# Patient Record
Sex: Female | Born: 1955 | Race: Black or African American | Hispanic: No | Smoking: Former smoker
Health system: Southern US, Community
[De-identification: ages and names within clinical notes are randomized; demographics above are authoritative.]

## PROBLEM LIST (undated history)

## (undated) DIAGNOSIS — F03918 Unspecified dementia, unspecified severity, with other behavioral disturbance: Secondary | ICD-10-CM

## (undated) DIAGNOSIS — F1010 Alcohol abuse, uncomplicated: Secondary | ICD-10-CM

## (undated) DIAGNOSIS — R1310 Dysphagia, unspecified: Secondary | ICD-10-CM

## (undated) DIAGNOSIS — R569 Unspecified convulsions: Secondary | ICD-10-CM

## (undated) DIAGNOSIS — F0391 Unspecified dementia with behavioral disturbance: Secondary | ICD-10-CM

## (undated) DIAGNOSIS — M1990 Unspecified osteoarthritis, unspecified site: Secondary | ICD-10-CM

## (undated) DIAGNOSIS — I82412 Acute embolism and thrombosis of left femoral vein: Secondary | ICD-10-CM

## (undated) DIAGNOSIS — F10959 Alcohol use, unspecified with alcohol-induced psychotic disorder, unspecified: Secondary | ICD-10-CM

## (undated) DIAGNOSIS — E119 Type 2 diabetes mellitus without complications: Secondary | ICD-10-CM

## (undated) DIAGNOSIS — K5909 Other constipation: Principal | ICD-10-CM

## (undated) HISTORY — DX: Alcohol use, unspecified with alcohol-induced psychotic disorder, unspecified: F10.959

## (undated) HISTORY — DX: Unspecified dementia with behavioral disturbance: F03.91

## (undated) HISTORY — DX: Other constipation: K59.09

## (undated) HISTORY — DX: Alcohol abuse, uncomplicated: F10.10

## (undated) HISTORY — DX: Unspecified osteoarthritis, unspecified site: M19.90

## (undated) HISTORY — DX: Unspecified dementia, unspecified severity, with other behavioral disturbance: F03.918

## (undated) HISTORY — DX: Dysphagia, unspecified: R13.10

## (undated) HISTORY — DX: Acute embolism and thrombosis of left femoral vein: I82.412

## (undated) HISTORY — DX: Type 2 diabetes mellitus without complications: E11.9

---

## 2011-12-24 ENCOUNTER — Encounter (HOSPITAL_COMMUNITY): Payer: Self-pay | Admitting: Emergency Medicine

## 2011-12-24 ENCOUNTER — Emergency Department (HOSPITAL_COMMUNITY)
Admission: EM | Admit: 2011-12-24 | Discharge: 2011-12-24 | Disposition: A | Discharge status: Home / Self Care | Payer: Medicaid Other | Attending: Emergency Medicine | Admitting: Emergency Medicine

## 2011-12-24 ENCOUNTER — Emergency Department (HOSPITAL_COMMUNITY): Payer: Medicaid Other

## 2011-12-24 DIAGNOSIS — M79609 Pain in unspecified limb: Secondary | ICD-10-CM | POA: Insufficient documentation

## 2011-12-24 DIAGNOSIS — F17200 Nicotine dependence, unspecified, uncomplicated: Secondary | ICD-10-CM | POA: Insufficient documentation

## 2011-12-24 DIAGNOSIS — M79659 Pain in unspecified thigh: Secondary | ICD-10-CM

## 2011-12-24 DIAGNOSIS — M25559 Pain in unspecified hip: Secondary | ICD-10-CM | POA: Insufficient documentation

## 2011-12-24 HISTORY — DX: Unspecified convulsions: R56.9

## 2011-12-24 MED ORDER — NAPROXEN 500 MG PO TABS: 500.0000 mg | ORAL_TABLET | Freq: Two times a day (BID) | ORAL | Status: AC

## 2011-12-24 MED ORDER — HYDROCODONE-ACETAMINOPHEN 5-325 MG PO TABS: 1.0000 | ORAL_TABLET | Freq: Four times a day (QID) | ORAL | Status: AC | PRN

## 2011-12-24 NOTE — ED Provider Notes (Signed)
Medical screening examination/treatment/procedure(s) were performed by non-physician practitioner and as supervising physician I was immediately available for consultation/collaboration.  Olivia Mackie, MD 12/24/11 2030

## 2011-12-24 NOTE — ED Provider Notes (Signed)
History     CSN: [id number]  Arrival date & time 12/24/11  0427   First MD Initiated Contact with Patient 12/24/11 0437      5:45 AM Reports she woke up with right-sided leg pain on a lateral portion of hip radiating laterally down the thigh to knee. Denies back pain. Reports pain with her from sleep. Patient denies numbness, tingling, weakness, back pain, abdominal pain, nausea, vomiting, urinary symptoms, injury. Patient appears very frail. Denies history of cancer chronic hip pain. Patient is a 56 y.o. female presenting with leg pain. The history is provided by the patient.  Leg Pain  The incident occurred 1 to 2 hours ago. The injury mechanism is unknown. The pain is present in the right hip. The pain is moderate. The pain has been constant since onset. Pertinent negatives include no numbness, no inability to bear weight, no loss of motion, no muscle weakness, no loss of sensation and no tingling. She reports no foreign bodies present. The symptoms are aggravated by bearing weight, activity and palpation. She has tried nothing for the symptoms.    Past Medical History  Diagnosis Date  . Seizures     History reviewed. No pertinent past surgical history.  History reviewed. No pertinent family history.  History  Substance Use Topics  . Smoking status: Current Everyday Smoker  . Smokeless tobacco: Not on file  . Alcohol Use: No     occ    OB History    Grav Para Term Preterm Abortions TAB SAB Ect Mult Living                  Review of Systems  Constitutional: Negative for fever and chills.  HENT: Negative for neck pain and neck stiffness.   Cardiovascular: Negative for chest pain.  Gastrointestinal: Negative for abdominal pain.  Genitourinary: Negative for dysuria, urgency, frequency, hematuria, flank pain and pelvic pain.  Musculoskeletal: Negative for back pain.       Hip pain. Denies saddle anesthesias, or perineal numbness, urinary or bowel incontinence    Neurological: Negative for tingling, weakness, numbness and headaches.  All other systems reviewed and are negative.    Allergies  Review of patient's allergies indicates no known allergies.  Home Medications   Current Outpatient Rx  Name Route Sig Dispense Refill  . ACETAMINOPHEN 325 MG PO TABS Oral Take 650 mg by mouth every 6 (six) hours as needed. For pain    . ASPIRIN 325 MG PO TABS Oral Take 325 mg by mouth every 6 (six) hours as needed. For pain      BP 101/62  Pulse 81  Temp 97.2 F (36.2 C) (Oral)  Resp 16  SpO2 98%  Physical Exam  Vitals reviewed. Constitutional: She is oriented to person, place, and time. Vital signs are normal. She appears well-developed and well-nourished. No distress.  HENT:  Head: Normocephalic and atraumatic.  Eyes: Pupils are equal, round, and reactive to light.  Neck: Neck supple.  Pulmonary/Chest: Effort normal.  Musculoskeletal:       Right hip: She exhibits tenderness. She exhibits normal range of motion, normal strength, no bony tenderness, no swelling, no crepitus, no deformity and no laceration.       Legs:      Patient reports pain that begins on the lateral right hip and radiates down she lateral knee. Patient reports pain with palpation of entire knee. However on putting on pants she stands on one leg using her right lower extremity without  difficulty. Normal sensation, distal pulses.  Neurological: She is alert and oriented to person, place, and time.  Skin: Skin is warm and dry. No rash noted. No erythema. No pallor.  Psychiatric: She has a normal mood and affect. Her behavior is normal.    ED Course  Procedures  No results found for this or any previous visit. Dg Pelvis 1-2 Views  12/24/2011  *RADIOLOGY REPORT*  Clinical Data: Right pelvic/hip pain.  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 There is mild flattening and increased density of the femoral heads. There is  no evidence of acute fracture, subluxation or dislocation. No focal bony lesions are identified.  IMPRESSION: Severe degenerative changes within both hips.  No evidence of acute abnormality.  Original Report Authenticated By: Rosendo Gros, M.D.      MDM    Patient d/ced with Analgesics and ortho referral      Thomasene Lot, PA-C 12/24/11 2007

## 2011-12-24 NOTE — ED Notes (Signed)
Patient with leg pain, right leg.  Patient states it started this morning.

## 2011-12-24 NOTE — ED Notes (Signed)
Pt discharged home. Encouraged to follow up with PCP. Wheelchair to triage.

## 2013-12-16 ENCOUNTER — Encounter: Payer: Self-pay | Admitting: Internal Medicine

## 2013-12-16 ENCOUNTER — Non-Acute Institutional Stay (SKILLED_NURSING_FACILITY): Payer: Medicaid Other | Admitting: Internal Medicine

## 2013-12-16 DIAGNOSIS — F10988 Alcohol use, unspecified with other alcohol-induced disorder: Secondary | ICD-10-CM

## 2013-12-16 DIAGNOSIS — R1310 Dysphagia, unspecified: Secondary | ICD-10-CM | POA: Insufficient documentation

## 2013-12-16 DIAGNOSIS — F10959 Alcohol use, unspecified with alcohol-induced psychotic disorder, unspecified: Secondary | ICD-10-CM | POA: Insufficient documentation

## 2013-12-16 DIAGNOSIS — F0391 Unspecified dementia with behavioral disturbance: Secondary | ICD-10-CM

## 2013-12-16 DIAGNOSIS — F03918 Unspecified dementia, unspecified severity, with other behavioral disturbance: Secondary | ICD-10-CM | POA: Insufficient documentation

## 2013-12-16 DIAGNOSIS — F1010 Alcohol abuse, uncomplicated: Secondary | ICD-10-CM | POA: Insufficient documentation

## 2013-12-16 DIAGNOSIS — E119 Type 2 diabetes mellitus without complications: Secondary | ICD-10-CM

## 2013-12-16 NOTE — Assessment & Plan Note (Signed)
No longer using 

## 2013-12-16 NOTE — Progress Notes (Signed)
MRN: [id number] Name: Crisoforo OxfordFrieda Tadesse  Sex: female Age: 58 y.o. DOB: 06/10/1955  [facility] Facility[facility]: 117A Level Of Care: SNF Provider: Merrilee SeashoreALEXANDER, Jayse Hodkinson D Emergency Contacts: No emergency contact information on file.  Code Status: FULL  Allergies: Review of patient's allergies indicates no known allergies.  Chief Complaint  Patient presents with  . nursing home admission    HPI: Patient is [age] y.o. female who is being admitted to SNF for dementia here to be close to family.  Past Medical History  Diagnosis Date  . Alcohol abuse   . Diabetes mellitus without complication   . Dementia with behavioral problem   . Arthritis   . Dysphagia     and aspiration risk  . 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    Medication List       This list is accurate as of: 12/16/13  8:56 PM.  Always use your most recent med list.               cholecalciferol 400 UNITS Tabs tablet  Commonly known as:  VITAMIN D  Take 800 Units by mouth daily.     gabapentin 300 MG capsule  Commonly known as:  NEURONTIN  Take 300 mg by mouth 3 (three) times daily.     Melatonin 3 MG Tabs  Take 2 tablets by mouth every evening.     metFORMIN 500 MG 24 hr tablet  Commonly known as:  GLUCOPHAGE-XR  Take 500 mg by mouth daily with breakfast.     thiamine 100 MG tablet  Take 100 mg by mouth daily.     traZODone 100 MG tablet  Commonly known as:  DESYREL  Take 100 mg by mouth at bedtime.        Meds ordered this encounter  Medications  . cholecalciferol (VITAMIN D) 400 UNITS TABS tablet    Sig: Take 800 Units by mouth daily.  Marland Kitchen. gabapentin (NEURONTIN) 300 MG capsule    Sig: Take 300 mg by mouth 3 (three) times daily.  . Melatonin 3 MG TABS    Sig: Take 2 tablets by mouth every evening.  . metFORMIN (GLUCOPHAGE-XR) 500 MG 24 hr tablet    Sig: Take 500 mg by mouth daily with breakfast.  . thiamine 100 MG tablet    Sig: Take 100 mg by mouth daily.  .  traZODone (DESYREL) 100 MG tablet    Sig: Take 100 mg by mouth at bedtime.     There is no immunization history on file for this patient.  History  Substance Use Topics  . Smoking status: Unknown If Ever Smoked  . Smokeless tobacco: Not on file  . Alcohol Use: Not on file     Comment: ABUSE    Family history is noncontributory    Review of Systems  DATA OBTAINED: from nurse GENERAL : no fevers, fatigue, appetite changes SKIN: No itching, rash or wounds EYES: No eye pain, redness, discharge EARS: No earache, tinnitus, change in hearing NOSE: No congestion, drainage or bleeding  MOUTH/THROAT: No mouth or tooth pain, No sore throat RESPIRATORY: No cough, wheezing, SOB CARDIAC: No chest pain, palpitations, lower extremity edema  GI: No abdominal pain, No N/V/D or constipation, No heartburn or reflux  GU: No dysuria, frequency or urgency, or incontinence  MUSCULOSKELETAL: No unrelieved bone/joint pain NEUROLOGIC: No headache, dizziness or focal weakness PSYCHIATRIC: No overt anxiety or sadness. Sleeps well. No behavior issue.   Filed Vitals:  12/16/13 2023  BP: 123/82  Pulse: 88  Temp: 97.8 F (36.6 C)  Resp: 18    Physical Exam  GENERAL APPEARANCE: Alert, min conversant. Appropriately groomed. No acute distress.  SKIN: No diaphoresis rash, HEAD: Normocephalic, atraumatic  EYES: Conjunctiva/lids clear. Pupils round, reactive. EOMs intact.  EARS: External exam WNL, canals clear. Hearing grossly normal.  NOSE: No deformity or discharge.  MOUTH/THROAT: Lips w/o lesions  RESPIRATORY: Breathing is even, unlabored. Lung sounds are clear   CARDIOVASCULAR: Heart RRR no murmurs, rubs or gallops. No peripheral edema.  GASTROINTESTINAL: Abdomen is soft, non-tender, not distended w/ normal bowel soun GENITOURINARY: Bladder non tender, not distended  MUSCULOSKELETAL: No abnormal joints or musculature NEUROLOGIC: Oriented X3. Cranial nerves 2-12 grossly intact. Moves all  extremities no tremor. PSYCHIATRIC: dementia, no behavioral issues  Patient Active Problem List   Diagnosis Date Noted  . Alcohol abuse   . Diabetes mellitus without complication   . Dementia with behavioral problem   . Dysphagia   . 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

## 2013-12-16 NOTE — Assessment & Plan Note (Signed)
Evaled by ST at prior hospital; appropriate diet

## 2013-12-16 NOTE — Assessment & Plan Note (Signed)
Will order baseline labs; Hba1c in 07/2013 reported < 7

## 2013-12-16 NOTE — Assessment & Plan Note (Signed)
Zyprexa for

## 2013-12-16 NOTE — Assessment & Plan Note (Signed)
Dementia is severe, on zyprexa for behavoir and desyrl

## 2013-12-22 ENCOUNTER — Encounter: Payer: Self-pay | Admitting: Internal Medicine

## 2014-01-18 ENCOUNTER — Emergency Department (HOSPITAL_COMMUNITY)
Admission: EM | Admit: 2014-01-18 | Discharge: 2014-01-18 | Disposition: A | Discharge status: Home / Self Care | Payer: Medicaid Other

## 2014-01-18 ENCOUNTER — Encounter (HOSPITAL_COMMUNITY): Payer: Self-pay | Admitting: Emergency Medicine

## 2014-01-18 ENCOUNTER — Emergency Department (HOSPITAL_COMMUNITY)
Admission: EM | Admit: 2014-01-18 | Discharge: 2014-01-18 | Disposition: A | Discharge status: Home / Self Care | Payer: Medicaid Other | Attending: Emergency Medicine | Admitting: Emergency Medicine

## 2014-01-18 DIAGNOSIS — F0390 Unspecified dementia without behavioral disturbance: Secondary | ICD-10-CM | POA: Diagnosis not present

## 2014-01-18 DIAGNOSIS — Z79899 Other long term (current) drug therapy: Secondary | ICD-10-CM | POA: Insufficient documentation

## 2014-01-18 DIAGNOSIS — S0180XA Unspecified open wound of other part of head, initial encounter: Secondary | ICD-10-CM | POA: Insufficient documentation

## 2014-01-18 DIAGNOSIS — Z8739 Personal history of other diseases of the musculoskeletal system and connective tissue: Secondary | ICD-10-CM | POA: Diagnosis not present

## 2014-01-18 DIAGNOSIS — Y929 Unspecified place or not applicable: Secondary | ICD-10-CM | POA: Diagnosis not present

## 2014-01-18 DIAGNOSIS — S0191XA Laceration without foreign body of unspecified part of head, initial encounter: Secondary | ICD-10-CM

## 2014-01-18 DIAGNOSIS — W050XXA Fall from non-moving wheelchair, initial encounter: Secondary | ICD-10-CM | POA: Diagnosis not present

## 2014-01-18 DIAGNOSIS — Z8659 Personal history of other mental and behavioral disorders: Secondary | ICD-10-CM | POA: Insufficient documentation

## 2014-01-18 DIAGNOSIS — E119 Type 2 diabetes mellitus without complications: Secondary | ICD-10-CM | POA: Insufficient documentation

## 2014-01-18 DIAGNOSIS — Y9389 Activity, other specified: Secondary | ICD-10-CM | POA: Insufficient documentation

## 2014-01-18 DIAGNOSIS — Z23 Encounter for immunization: Secondary | ICD-10-CM | POA: Insufficient documentation

## 2014-01-18 MED ORDER — TETANUS-DIPHTH-ACELL PERTUSSIS 5-2.5-18.5 LF-MCG/0.5 IM SUSP
0.5000 mL | Freq: Once | INTRAMUSCULAR | Status: AC
  Administered 2014-01-18: 0.5 mL via INTRAMUSCULAR
  Filled 2014-01-18: qty 0.5

## 2014-01-18 MED ORDER — HYDROMORPHONE HCL PF 1 MG/ML IJ SOLN: 1.0000 mg | Freq: Once | INTRAMUSCULAR | Status: DC

## 2014-01-18 NOTE — ED Notes (Signed)
Bed: WA07 Expected date: 01/18/14 Expected time: 9:18 PM Means of arrival: Ambulance Comments: Bed 7, EMS, NH, Fall/Head Lac

## 2014-01-18 NOTE — ED Notes (Signed)
[facility] called for transport.  

## 2014-01-18 NOTE — ED Provider Notes (Signed)
CSN: [id number]     Arrival date & time 01/18/14  2120 History   First MD Initiated Contact with Patient 01/18/14 2127     Chief Complaint  Patient presents with  . Fall  . Facial Laceration     (Consider location/radiation/quality/duration/timing/severity/associated sxs/prior Treatment) Patient is a 58 y.o. female presenting with fall. The history is provided by the patient and medical records.  Fall  Level V caveat:  Dementia Patient is a 58 year old female with past history significant for alcohol abuse, diabetes, dementia, psychosis, presenting to the ED for fall. Per facility, patient was leaning forward in her wheelchair as she usually does and fell onto the floor. There was no reported loss of consciousness. Patient has a small superficial laceration above her right eyebrow. No active bleeding. Date of last tetanus unknown. Patient is not currently on any anticoagulants.  Past Medical History  Diagnosis Date  . Alcohol abuse   . Diabetes mellitus without complication   . Dementia with behavioral problem   . Arthritis   . Dysphagia     and aspiration risk  . 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 No pertinent past surgical history. No family history on file. History  Substance Use Topics  . Smoking status: Unknown If Ever Smoked  . Smokeless tobacco: Not on file  . Alcohol Use: Not on file     Comment: ABUSE   OB History   Grav Para Term Preterm Abortions TAB SAB Ect Mult Living                 Review of Systems  Unable to perform ROS: Dementia  All other systems reviewed and are negative.     Allergies  Review of patient's allergies indicates no known allergies.  Home Medications   Prior to Admission medications   Medication Sig Start Date End Date Taking? Authorizing Provider  cholecalciferol (VITAMIN D) 400 UNITS TABS tablet Take 800 Units by mouth daily.    Historical Provider, MD  gabapentin (NEURONTIN) 300 MG capsule Take 300 mg by mouth 3  (three) times daily.    Historical Provider, MD  Melatonin 3 MG TABS Take 2 tablets by mouth every evening.    Historical Provider, MD  metFORMIN (GLUCOPHAGE-XR) 500 MG 24 hr tablet Take 500 mg by mouth daily with breakfast.    Historical Provider, MD  thiamine 100 MG tablet Take 100 mg by mouth daily.    Historical Provider, MD  traZODone (DESYREL) 100 MG tablet Take 100 mg by mouth at bedtime.    Historical Provider, MD   BP 117/68  Pulse 109  Temp(Src) 98.9 F (37.2 C) (Oral)  Resp 17  SpO2 97%  Physical Exam  Nursing note and vitals reviewed. Constitutional: She appears well-developed and well-nourished. No distress.  HENT:  Head: Normocephalic. Head is with laceration.    Mouth/Throat: Oropharynx is clear and moist.  1cm superficial laceration above right eyebrow; no underlying hematoma or bruising; orbital rim non-tender; no bony deformities noted; no active bleeding or signs of infection  Eyes: Conjunctivae and EOM are normal. Pupils are equal, round, and reactive to light.  Neck: Normal range of motion. Neck supple.  Cardiovascular: Normal rate, regular rhythm and normal heart sounds.   Pulmonary/Chest: Effort normal and breath sounds normal. No respiratory distress. She has no wheezes.  Musculoskeletal: Normal range of motion. She exhibits no edema.  Neurological: She is alert.  Skin: Skin is warm and dry. She is not diaphoretic.  Psychiatric:  She has a normal mood and affect.    ED Course  Procedures (including critical care time)  LACERATION REPAIR Performed by: Garlon Hatchet Authorized by: Garlon Hatchet Consent: Verbal consent obtained. Risks and benefits: risks, benefits and alternatives were discussed Consent given by: patient Patient identity confirmed: provided demographic data Prepped and Draped in normal sterile fashion Wound explored  Laceration Location: right eyebrow  Laceration Length: 1cm, superficial  No Foreign Bodies seen or  palpated  Anesthesia: none  Local anesthetic: none  Anesthetic total: 0 ml  Irrigation method: syringe Amount of cleaning: standard  Skin closure: dermabond  Number of sutures: 0  Technique: n/a  Patient tolerance: Patient tolerated the procedure well with no immediate complications.  Labs Review Labs Reviewed - No data to display  Imaging Review No results found.   EKG Interpretation None      MDM   Final diagnoses:  Laceration of head, initial encounter   Superficial laceration above right eyebrow without associated hematoma, bruising, or bony deformities. Patient is not currently on any anticoagulants. Low suspicion for intracranial pathology. Tetanus updated. Laceration repaired with Dermabond patient tolerated well. She'll be discharged back to her facility via [facility].  Garlon Hatchet, PA-C 01/18/14 2354

## 2014-01-18 NOTE — ED Notes (Signed)
Pt presents via EMS from [facility] with c/o fall and facial laceration. Per EMS, pt was leaning forward in her wheelchair and fell onto the floor, laceration above her eyebrow, bleeding controlled.

## 2014-01-18 NOTE — Discharge Instructions (Signed)
Monitor wound for signs of infection-- redness, swelling, drainage, etc. Follow-up with PCP. Return to ED for new concerns.

## 2014-01-19 ENCOUNTER — Other Ambulatory Visit: Payer: Self-pay | Admitting: *Deleted

## 2014-01-19 MED ORDER — LORAZEPAM 2 MG/ML IJ SOLN: INTRAMUSCULAR | Status: DC

## 2014-01-19 NOTE — Telephone Encounter (Signed)
[facility] 

## 2014-01-21 NOTE — ED Provider Notes (Signed)
Medical screening examination/treatment/procedure(s) were performed by non-physician practitioner and as supervising physician I was immediately available for consultation/collaboration.   EKG Interpretation None        Richardean Canal, MD 01/21/14 [phone]

## 2014-01-28 ENCOUNTER — Encounter: Payer: Self-pay | Admitting: Internal Medicine

## 2014-01-28 ENCOUNTER — Non-Acute Institutional Stay (SKILLED_NURSING_FACILITY): Payer: Medicaid Other | Admitting: Internal Medicine

## 2014-01-28 DIAGNOSIS — F0391 Unspecified dementia with behavioral disturbance: Secondary | ICD-10-CM

## 2014-01-28 DIAGNOSIS — F03918 Unspecified dementia, unspecified severity, with other behavioral disturbance: Secondary | ICD-10-CM

## 2014-01-28 DIAGNOSIS — G4700 Insomnia, unspecified: Secondary | ICD-10-CM

## 2014-01-28 DIAGNOSIS — IMO0002 Reserved for concepts with insufficient information to code with codable children: Secondary | ICD-10-CM

## 2014-01-28 DIAGNOSIS — F1010 Alcohol abuse, uncomplicated: Secondary | ICD-10-CM

## 2014-01-28 DIAGNOSIS — F10988 Alcohol use, unspecified with other alcohol-induced disorder: Secondary | ICD-10-CM

## 2014-01-28 DIAGNOSIS — E119 Type 2 diabetes mellitus without complications: Secondary | ICD-10-CM

## 2014-01-28 DIAGNOSIS — F10959 Alcohol use, unspecified with alcohol-induced psychotic disorder, unspecified: Secondary | ICD-10-CM

## 2014-01-28 DIAGNOSIS — M792 Neuralgia and neuritis, unspecified: Secondary | ICD-10-CM

## 2014-01-28 NOTE — Progress Notes (Signed)
Patient ID: Sarah Mckay, female   DOB: 07/11/1955, 58 y.o.   MRN: [id number]  Location:  [facility] Provider:  Gwenith Spitz. Renato Gails, D.O., C.M.D.  Code Status:  Full code  Chief Complaint  Patient presents with  . Medical Management of Chronic Issues    HPI:  58yo black female with h/o dementia due to alcohol abuse, arthritis, anemia, and insomnia here probably for long term care admitted just over a month ago to this facility to be close to her family.  She has no specific complaints herself today but admits to joint pains.    Review of Systems:  Review of Systems  Constitutional: Negative for fever and chills.  HENT: Negative for congestion.   Respiratory: Negative for shortness of breath.   Cardiovascular: Negative for chest pain.  Gastrointestinal: Negative for abdominal pain.  Genitourinary: Negative for dysuria.  Musculoskeletal: Positive for joint pain.  Neurological: Positive for dizziness.  Endo/Heme/Allergies: Bruises/bleeds easily.  Psychiatric/Behavioral: The patient has insomnia.     Medications: Patient's Medications  New Prescriptions   No medications on file  Previous Medications   CHOLECALCIFEROL (VITAMIN D) 400 UNITS TABS TABLET    Take 800 Units by mouth daily.   GABAPENTIN (NEURONTIN) 300 MG CAPSULE    Take 600 mg by mouth 3 (three) times daily.    MAGNESIUM HYDROXIDE (MILK OF MAGNESIA) 400 MG/5ML SUSPENSION    Take 30 mLs by mouth daily as needed for mild constipation.   MELATONIN 3 MG TABS    Take 2 tablets by mouth every evening.   METFORMIN (GLUCOPHAGE-XR) 500 MG 24 HR TABLET    Take 500 mg by mouth daily with breakfast.   MULTIPLE VITAMIN (MULTIVITAMIN WITH MINERALS) TABS TABLET    Take 1 tablet by mouth daily.   OLANZAPINE (ZYPREXA) 10 MG TABLET    Take 10 mg by mouth at bedtime.   OSTOMY SUPPLIES (SKIN PREP WIPES) MISC    1 application by Does not apply route daily. To right heel blister   THIAMINE 100 MG TABLET    Take 100 mg by  mouth daily.   TRAZODONE (DESYREL) 100 MG TABLET    Take 100 mg by mouth at bedtime.  Modified Medications   No medications on file  Discontinued Medications   LORAZEPAM (ATIVAN) 2 MG/ML INJECTION    Inject  (0.61ml) IM one time only    Physical Exam: Filed Vitals:   01/28/14 1558  BP: 112/70  Pulse: 96  Temp: 97.6 F (36.4 C)  Resp: 20  Height:  (1.676 m)  Weight: 107 lb (48.535 kg)  SpO2: 96%  Physical Exam  Constitutional: No distress.  Cardiovascular: Normal rate, regular rhythm, normal heart sounds and intact distal pulses.   Pulmonary/Chest: Effort normal and breath sounds normal. No respiratory distress.  Abdominal: Soft. Bowel sounds are normal. She exhibits no distension. There is no tenderness.  Musculoskeletal: Normal range of motion. She exhibits tenderness.  Of most joints  Neurological: She is alert.  Skin: Skin is warm and dry.  Psychiatric: She has a normal mood and affect.    Assessment/Plan 1. Alcohol abuse -has been longstanding and most likely cause of her dementia -cont thiamine, check b12/folate  2. Dementia with behavioral problem -with psychosis likely all due to dementia -not on aricept and namenda at this point--dependent in adls except feeding so benefit limited at this time  3. Diabetes mellitus without complication -cont metformin -f/u bmp and hba1c--keep a close eye on renal function  with metformin -consider tradjenta if renal function declines -also may not need treatment long term if losing weight  4. 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 Insomnia -cont trazodone and melatonin for this  6. Neuropathic pain -cont gabapentin   po tid--this is max dose -again monitor renal function--may need dose reduction -check b12 and tsh to r/o these causes as  contributors, but likely is diabetes related  Family/ staff Communication:  Discussed with her nurse  Goals of care: full code, long term care resident recently admitted  Labs/tests ordered:  B12, tsh, hba1c, bmp next draw

## 2014-02-03 ENCOUNTER — Ambulatory Visit (HOSPITAL_COMMUNITY)
Admission: RE | Admit: 2014-02-03 | Discharge: 2014-02-03 | Disposition: A | Discharge status: Home / Self Care | Payer: Medicaid Other | Source: Ambulatory Visit | Attending: Internal Medicine | Admitting: Internal Medicine

## 2014-02-03 ENCOUNTER — Other Ambulatory Visit: Payer: Self-pay | Admitting: Internal Medicine

## 2014-02-03 DIAGNOSIS — F1010 Alcohol abuse, uncomplicated: Secondary | ICD-10-CM

## 2014-02-03 DIAGNOSIS — F0280 Dementia in other diseases classified elsewhere without behavioral disturbance: Secondary | ICD-10-CM

## 2014-02-18 ENCOUNTER — Encounter (HOSPITAL_COMMUNITY): Payer: Self-pay | Admitting: Emergency Medicine

## 2014-05-11 ENCOUNTER — Non-Acute Institutional Stay (SKILLED_NURSING_FACILITY): Payer: Medicaid Other | Admitting: Adult Health

## 2014-05-11 DIAGNOSIS — G8929 Other chronic pain: Secondary | ICD-10-CM

## 2014-05-11 DIAGNOSIS — R1310 Dysphagia, unspecified: Secondary | ICD-10-CM

## 2014-05-11 DIAGNOSIS — F10959 Alcohol use, unspecified with alcohol-induced psychotic disorder, unspecified: Secondary | ICD-10-CM

## 2014-05-11 DIAGNOSIS — F0391 Unspecified dementia with behavioral disturbance: Secondary | ICD-10-CM

## 2014-05-11 DIAGNOSIS — F03918 Unspecified dementia, unspecified severity, with other behavioral disturbance: Secondary | ICD-10-CM

## 2014-05-11 DIAGNOSIS — E119 Type 2 diabetes mellitus without complications: Secondary | ICD-10-CM

## 2014-05-13 ENCOUNTER — Encounter: Payer: Self-pay | Admitting: Adult Health

## 2014-05-13 DIAGNOSIS — G8929 Other chronic pain: Secondary | ICD-10-CM | POA: Insufficient documentation

## 2014-05-13 NOTE — Progress Notes (Signed)
Patient ID: Crisoforo OxfordFrieda Grisby, female   DOB: 10/15/1955, 58 y.o.   MRN: [id number]  [facility]     No Known Allergies     Chief Complaint  Patient presents with  . Medical Management of Chronic Issues    HPI:  She is a long term resident of this facility being seen for the management of her chronic illnesses. There has been little change in her recent status. She is unable to fully participate in the hpi or ros. There are no nursing concerns at this time.    Past Medical History  Diagnosis Date  . Seizures   . Alcohol abuse   . Diabetes mellitus without complication   . Dementia with behavioral problem   . Arthritis   . Dysphagia     and aspiration risk  . 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  VITAL SIGNS BP 114/80 mmHg  Pulse 88  Ht 5\' 6"  (1.676 m)  Wt 112 lb (50.803 kg)  BMI 18.09 kg/m2  SpO2 98%   Outpatient Encounter Prescriptions as of 05/11/2014  Medication Sig  . acetaminophen (TYLENOL) 325 MG tablet Take 650 mg by mouth every 6 (six) hours as needed. For pain  . aspirin 325 MG tablet Take 325 mg by mouth every 6 (six) hours as needed. For pain  . cholecalciferol (VITAMIN D) 400 UNITS TABS tablet Take 1,000 Units by mouth daily.   Marland Kitchen. gabapentin (NEURONTIN) 300 MG capsule Take 600 mg by mouth 3 (three) times daily.   . magnesium hydroxide (MILK OF MAGNESIA) 400 MG/5ML suspension Take 30 mLs by mouth every 8 (eight) hours as needed for mild constipation.   . Melatonin 3 MG TABS Take 2 tablets by mouth at bedtime as needed.   . metFORMIN (GLUCOPHAGE-XR) 500 MG 24 hr tablet Take 500 mg by mouth daily with breakfast.  . Multiple Vitamin (MULTIVITAMIN WITH MINERALS) TABS tablet Take 1 tablet by mouth daily.  Marland Kitchen. OLANZapine (ZYPREXA) 10 MG tablet Take 10 mg by mouth at bedtime.  Sherren Mocha. Ostomy Supplies (SKIN PREP WIPES) MISC 1 application by Does not apply route daily. To right heel blister  . thiamine 100 MG tablet Take 100 mg by mouth daily.  .  traMADol (ULTRAM) 50 MG tablet Take 50 mg by mouth every 6 (six) hours as needed. Twice daily routinely and every 6 hours as needed  . traZODone (DESYREL) 100 MG tablet Take 100 mg by mouth at bedtime.     SIGNIFICANT DIAGNOSTIC EXAMS  LABS REVIEWED:   01-29-14: glucose 119; bun 13.1; creat 0.51; k+4.0; na++142; tsh 0.93; vit b12: 528; hgb a1c 6.4 04-10-14: total testosterone: 492;  Free testosterone: 60.5      Review of Systems  Unable to perform ROS    Physical Exam  Constitutional: No distress.  Frail   Neck: Neck supple. No JVD present. No thyromegaly present.  Cardiovascular: Normal rate, regular rhythm and intact distal pulses.   Respiratory: Effort normal and breath sounds normal. No respiratory distress.  GI: Soft. Bowel sounds are normal. She exhibits no distension. There is no tenderness.  Musculoskeletal: She exhibits no edema.  Is able to move all extremities   Neurological: She is alert.  Skin: Skin is warm and dry. She is not diaphoretic.       ASSESSMENT/ PLAN:  1. Diabetes: she is presently stable her hgb a1c 6.4; will continue metformin 500 gm daily; asa 325 mg daily  and will monitor  2. Dysphagia: no signs of aspiration present; will not make changes in her current plan of care and will monitor her status.   3. Chronic pain: no indications of pain present; will continue neurontin 600 mg three times daily; ultram 50 gm twice daily and every 6 hours as needed and will monitor   4. Psychosis; due to alcohol: she is presently without change in status; will continue zyprexa 10 mg daily; thiamine 100 mg dialy; trazodone 100 mg nightly for sleep and will monitor  5. 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     Synthia Innocenteborah Verlie Hellenbrand NP [facility]  Contact [phone] Monday through Friday 8am- 5pm  After hours call  [phone]

## 2014-06-03 ENCOUNTER — Non-Acute Institutional Stay (SKILLED_NURSING_FACILITY): Payer: Medicaid Other | Admitting: Adult Health

## 2014-06-03 ENCOUNTER — Encounter: Payer: Self-pay | Admitting: Adult Health

## 2014-06-03 DIAGNOSIS — M170 Bilateral primary osteoarthritis of knee: Secondary | ICD-10-CM

## 2014-06-03 MED ORDER — DICLOFENAC SODIUM 1 % TD GEL: 2.0000 g | Freq: Four times a day (QID) | TRANSDERMAL | Status: DC

## 2014-06-03 NOTE — Progress Notes (Signed)
Patient ID: Sarah Mckay, female   DOB: 11/13/1955, 59 y.o.   MRN: [id number]  [facility]     No Known Allergies     Chief Complaint  Patient presents with  . Acute Visit    knee pain     HPI:  Nursing staff reports that she is having increased pain in both lower legs. She is unable to fully participate in the hpi or ros; but does have pain in both knees to palpation. The reports that she is needing more ultram without relief of her pain.    Past Medical History  Diagnosis Date  . Seizures   . Alcohol abuse   . Diabetes mellitus without complication   . Dementia with behavioral problem   . Arthritis   . Dysphagia     and aspiration risk  . 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  VITAL SIGNS BP 119/80 mmHg  Pulse 70  Ht 5\' 6"  (1.676 m)  Wt 112 lb (50.803 kg)  BMI 18.09 kg/m2   Outpatient Encounter Prescriptions as of 06/03/2014  Medication Sig  . acetaminophen (TYLENOL) 325 MG tablet Take 650 mg by mouth every 6 (six) hours as needed. For pain  . aspirin 325 MG tablet Take 325 mg by mouth every 6 (six) hours as needed. For pain  . cholecalciferol (VITAMIN D) 400 UNITS TABS tablet Take 1,000 Units by mouth daily.   Marland Kitchen. gabapentin (NEURONTIN) 300 MG capsule Take 600 mg by mouth 3 (three) times daily.   . magnesium hydroxide (MILK OF MAGNESIA) 400 MG/5ML suspension Take 30 mLs by mouth every 8 (eight) hours as needed for mild constipation.   . Melatonin 3 MG TABS Take 2 tablets by mouth at bedtime as needed.   . metFORMIN (GLUCOPHAGE-XR) 500 MG 24 hr tablet Take 500 mg by mouth daily with breakfast.  . Multiple Vitamin (MULTIVITAMIN WITH MINERALS) TABS tablet Take 1 tablet by mouth daily.  Marland Kitchen. OLANZapine (ZYPREXA) 10 MG tablet Take 10 mg by mouth at bedtime.  Sherren Mocha. Ostomy Supplies (SKIN PREP WIPES) MISC 1 application by Does not apply route daily. To right heel blister  . thiamine 100 MG tablet Take 100 mg by mouth daily.  . traMADol (ULTRAM) 50 MG  tablet Take 50 mg by mouth every 6 (six) hours as needed. Twice daily routinely and every 6 hours as needed  . traZODone (DESYREL) 100 MG tablet Take 100 mg by mouth at bedtime.     SIGNIFICANT DIAGNOSTIC EXAMS   LABS REVIEWED:   01-29-14: glucose 119; bun 13.1; creat 0.51; k+4.0; na++142; tsh 0.93; vit b12: 528; hgb a1c 6.4 04-10-14: total testosterone: 492;  Free testosterone: 60.5      Review of Systems  Unable to perform ROS    Physical Exam  Constitutional: No distress.  Frail   Neck: Neck supple. No JVD present. No thyromegaly present.  Cardiovascular: Normal rate, regular rhythm and intact distal pulses.   Respiratory: Effort normal and breath sounds normal. No respiratory distress.  GI: Soft. Bowel sounds are normal. She exhibits no distension. There is no tenderness.  Musculoskeletal: She exhibits no edema.  Is able to move all extremities Bilateral knees are tender to touch    Neurological: She is alert.  Skin: Skin is warm and dry. She is not diaphoretic.      ASSESSMENT/ PLAN:  1. Chronic pain  2. osteoarthritis of bilateral knees:  Will begin voltaren gel 2 gms to both knees  four times daily and will monitor     Synthia Innocent NP [facility]  Contact [phone] Monday through Friday 8am- 5pm  After hours call [phone]

## 2014-06-10 ENCOUNTER — Encounter: Payer: Self-pay | Admitting: Adult Health

## 2014-06-10 ENCOUNTER — Non-Acute Institutional Stay (SKILLED_NURSING_FACILITY): Payer: Medicaid Other | Admitting: Adult Health

## 2014-06-10 DIAGNOSIS — E119 Type 2 diabetes mellitus without complications: Secondary | ICD-10-CM

## 2014-06-10 DIAGNOSIS — M170 Bilateral primary osteoarthritis of knee: Secondary | ICD-10-CM

## 2014-06-10 DIAGNOSIS — F10959 Alcohol use, unspecified with alcohol-induced psychotic disorder, unspecified: Secondary | ICD-10-CM

## 2014-06-10 DIAGNOSIS — F03918 Unspecified dementia, unspecified severity, with other behavioral disturbance: Secondary | ICD-10-CM

## 2014-06-10 DIAGNOSIS — R1310 Dysphagia, unspecified: Secondary | ICD-10-CM

## 2014-06-10 DIAGNOSIS — F0391 Unspecified dementia with behavioral disturbance: Secondary | ICD-10-CM

## 2014-06-10 DIAGNOSIS — G8929 Other chronic pain: Secondary | ICD-10-CM

## 2014-06-30 ENCOUNTER — Encounter: Payer: Self-pay | Admitting: Adult Health

## 2014-06-30 MED ORDER — TRAMADOL HCL 50 MG PO TABS: 50.0000 mg | ORAL_TABLET | Freq: Three times a day (TID) | ORAL | Status: DC

## 2014-06-30 MED ORDER — GABAPENTIN 300 MG PO CAPS: 900.0000 mg | ORAL_CAPSULE | Freq: Three times a day (TID) | ORAL | Status: DC

## 2014-06-30 NOTE — Progress Notes (Signed)
Patient ID: Sarah Mckay, female   DOB: [birth year], 59 y.o.   MRN: [id number]  [facility]     No Known Allergies     Chief Complaint  Patient presents with  . Medical Management of Chronic Issues    HPI:  She is a long term resident of this facility being seen for the management of her chronic illnesses. She is having knee pain. She cannot fully participate in the hpi; but does have pain present. Nursing staff reports that this pain is interfering in her quality of life.    Past Medical History  Diagnosis Date  . Seizures   . Alcohol abuse   . Diabetes mellitus without complication   . Dementia with behavioral problem   . Arthritis   . Dysphagia     and aspiration risk  . 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  VITAL SIGNS BP 122/87 mmHg  Pulse 80  Ht  (1.676 m)  Wt 109 lb (49.442 kg)  BMI 17.60 kg/m2  SpO2 97%   Outpatient Encounter Prescriptions as of 06/10/2014  Medication Sig  . acetaminophen (TYLENOL) 325 MG tablet Take 650 mg by mouth every 6 (six) hours as needed. For pain  . aspirin 325 MG tablet Take 325 mg by mouth every 6 (six) hours as needed. For pain  . cholecalciferol (VITAMIN D) 400 UNITS TABS tablet Take 1,000 Units by mouth daily.   . diclofenac sodium (VOLTAREN) 1 % GEL Apply 2 g topically 4 (four) times daily. To both knees  . gabapentin (NEURONTIN) 300 MG capsule Take 600 mg by mouth 3 (three) times daily.   . magnesium hydroxide (MILK OF MAGNESIA) 400 MG/5ML suspension Take 30 mLs by mouth every 8 (eight) hours as needed for mild constipation.   . Melatonin 3 MG TABS Take 2 tablets by mouth at bedtime as needed.   . metFORMIN (GLUCOPHAGE-XR) 500 MG 24 hr tablet Take 500 mg by mouth daily with breakfast.  . Multiple Vitamin (MULTIVITAMIN WITH MINERALS) TABS tablet Take 1 tablet by mouth daily.  Marland Kitchen OLANZapine (ZYPREXA) 10 MG tablet Take 10 mg by mouth at bedtime.  Sherren Mocha Supplies (SKIN PREP WIPES) MISC 1  application by Does not apply route daily. To right heel blister  . thiamine 100 MG tablet Take 100 mg by mouth daily.  . traMADol (ULTRAM) 50 MG tablet Take 50 mg by mouth every 6 (six) hours as needed. Twice daily routinely and every 6 hours as needed  . traZODone (DESYREL) 100 MG tablet Take 100 mg by mouth at bedtime.     SIGNIFICANT DIAGNOSTIC EXAMS  LABS REVIEWED:   01-29-14: glucose 119; bun 13.1; creat 0.51; k+4.0; na++142; tsh 0.93; vit b12: 528; hgb a1c 6.4 04-10-14: total testosterone: 492;  Free testosterone: 60.5       Review of Systems  Unable to perform ROS    Physical Exam Constitutional: No distress.  Frail   Neck: Neck supple. No JVD present. No thyromegaly present.  Cardiovascular: Normal rate, regular rhythm and intact distal pulses.   Respiratory: Effort normal and breath sounds normal. No respiratory distress.  GI: Soft. Bowel sounds are normal. She exhibits no distension. There is no tenderness.  Musculoskeletal: She exhibits no edema.  Is able to move all extremities Bilateral knees are tender to touch    Neurological: She is alert.  Skin: Skin is warm and dry. She is not diaphoretic.    ASSESSMENT/ PLAN:  1. Diabetes: she is presently stable her hgb a1c 6.4; will continue metformin 500 gm daily; asa 325 mg daily  and will monitor   2. Dysphagia: no signs of aspiration present; will not make changes in her current plan of care and will monitor her status.   3. Chronic pain with osteoarthritis of both knees: she is not getting adequate pain relief: will continue voltaren gel 2 gm to both knees four times daily; will increase her neurontin to 90 mg three times daily; and will increase her ultram to 50 mg three times daily and will monitor her status.   4. Psychosis; due to alcohol: she is presently without change in status; will continue zyprexa 10 mg daily; thiamine 100 mg dialy; trazodone 100 mg nightly for sleep and will monitor  5. 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    Will check cbc; cmp and hgb a1c   Synthia Innocenteborah Jaliana Medellin NP [facility]  Contact [phone] Monday through Friday 8am- 5pm  After hours call [phone]

## 2014-07-06 ENCOUNTER — Other Ambulatory Visit: Payer: Self-pay | Admitting: *Deleted

## 2014-07-06 DIAGNOSIS — G8929 Other chronic pain: Secondary | ICD-10-CM

## 2014-07-06 DIAGNOSIS — M170 Bilateral primary osteoarthritis of knee: Secondary | ICD-10-CM

## 2014-07-06 MED ORDER — TRAMADOL HCL 50 MG PO TABS: ORAL_TABLET | ORAL | Status: DC

## 2014-07-06 NOTE — Telephone Encounter (Signed)
[facility] 

## 2014-07-07 NOTE — Progress Notes (Signed)
Patient ID: Sarah Mckay, female   DOB: 06/08/1955, 59 y.o.   MRN: [id number]  [facility]     No Known Allergies     Chief Complaint  Patient presents with  . Medical Management of Chronic Issues    HPI:    Past Medical History  Diagnosis Date  . Seizures   . Alcohol abuse   . Diabetes mellitus without complication   . Dementia with behavioral problem   . Arthritis   . Dysphagia     and aspiration risk  . 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  VITAL SIGNS BP 122/78 mmHg  Pulse 80  Ht 5\' 6"  (1.676 m)  Wt 109 lb (49.442 kg)  BMI 17.60 kg/m2  SpO2 97%   Outpatient Encounter Prescriptions as of 06/10/2014  Medication Sig  . acetaminophen (TYLENOL) 325 MG tablet Take 650 mg by mouth every 6 (six) hours as needed. For pain  . aspirin 325 MG tablet Take 325 mg by mouth every 6 (six) hours as needed. For pain  . cholecalciferol (VITAMIN D) 400 UNITS TABS tablet Take 1,000 Units by mouth daily.   . diclofenac sodium (VOLTAREN) 1 % GEL Apply 2 g topically 4 (four) times daily. To both knees  . gabapentin (NEURONTIN) 300 MG capsule Take 3 capsules (900 mg total) by mouth 3 (three) times daily.  . magnesium hydroxide (MILK OF MAGNESIA) 400 MG/5ML suspension Take 30 mLs by mouth every 8 (eight) hours as needed for mild constipation.   . Melatonin 3 MG TABS Take 2 tablets by mouth at bedtime as needed.   . metFORMIN (GLUCOPHAGE-XR) 500 MG 24 hr tablet Take 500 mg by mouth daily with breakfast.  . Multiple Vitamin (MULTIVITAMIN WITH MINERALS) TABS tablet Take 1 tablet by mouth daily.  Marland Kitchen. OLANZapine (ZYPREXA) 10 MG tablet Take 10 mg by mouth at bedtime.  Sherren Mocha. Ostomy Supplies (SKIN PREP WIPES) MISC 1 application by Does not apply route daily. To right heel blister  . thiamine 100 MG tablet Take 100 mg by mouth daily.  . traMADol (ULTRAM) 50 MG tablet Take one tablet by mouth three times daily for pain; Take one tablet by mouth once daily as needed for  pain  . traZODone (DESYREL) 100 MG tablet Take 100 mg by mouth at bedtime.     SIGNIFICANT DIAGNOSTIC EXAMS    ROS   Physical Exam     ASSESSMENT/ PLAN:    Synthia InnocentDeborah Green NP [facility]  Contact [phone] Monday through Friday 8am- 5pm  After hours call [phone]   This encounter was created in error - please disregard.

## 2014-07-29 ENCOUNTER — Non-Acute Institutional Stay (SKILLED_NURSING_FACILITY): Payer: Medicaid Other | Admitting: Adult Health

## 2014-07-29 DIAGNOSIS — E119 Type 2 diabetes mellitus without complications: Secondary | ICD-10-CM

## 2014-07-29 DIAGNOSIS — R1310 Dysphagia, unspecified: Secondary | ICD-10-CM | POA: Diagnosis not present

## 2014-07-29 DIAGNOSIS — F0391 Unspecified dementia with behavioral disturbance: Secondary | ICD-10-CM

## 2014-07-29 DIAGNOSIS — G8929 Other chronic pain: Secondary | ICD-10-CM | POA: Diagnosis not present

## 2014-07-29 DIAGNOSIS — M170 Bilateral primary osteoarthritis of knee: Secondary | ICD-10-CM | POA: Diagnosis not present

## 2014-07-29 DIAGNOSIS — F10959 Alcohol use, unspecified with alcohol-induced psychotic disorder, unspecified: Secondary | ICD-10-CM | POA: Diagnosis not present

## 2014-07-29 DIAGNOSIS — F03918 Unspecified dementia, unspecified severity, with other behavioral disturbance: Secondary | ICD-10-CM

## 2014-08-04 ENCOUNTER — Emergency Department (HOSPITAL_COMMUNITY): Payer: Medicaid Other

## 2014-08-04 ENCOUNTER — Emergency Department (HOSPITAL_COMMUNITY)
Admission: EM | Admit: 2014-08-04 | Discharge: 2014-08-04 | Disposition: A | Discharge status: Home / Self Care | Payer: Medicaid Other | Attending: Emergency Medicine | Admitting: Emergency Medicine

## 2014-08-04 ENCOUNTER — Encounter (HOSPITAL_COMMUNITY): Payer: Self-pay

## 2014-08-04 DIAGNOSIS — S8992XA Unspecified injury of left lower leg, initial encounter: Secondary | ICD-10-CM | POA: Insufficient documentation

## 2014-08-04 DIAGNOSIS — F29 Unspecified psychosis not due to a substance or known physiological condition: Secondary | ICD-10-CM | POA: Diagnosis not present

## 2014-08-04 DIAGNOSIS — M1990 Unspecified osteoarthritis, unspecified site: Secondary | ICD-10-CM | POA: Diagnosis not present

## 2014-08-04 DIAGNOSIS — Y92128 Other place in nursing home as the place of occurrence of the external cause: Secondary | ICD-10-CM | POA: Insufficient documentation

## 2014-08-04 DIAGNOSIS — F0390 Unspecified dementia without behavioral disturbance: Secondary | ICD-10-CM | POA: Insufficient documentation

## 2014-08-04 DIAGNOSIS — Y9389 Activity, other specified: Secondary | ICD-10-CM | POA: Insufficient documentation

## 2014-08-04 DIAGNOSIS — W19XXXA Unspecified fall, initial encounter: Secondary | ICD-10-CM

## 2014-08-04 DIAGNOSIS — S0003XA Contusion of scalp, initial encounter: Secondary | ICD-10-CM | POA: Diagnosis not present

## 2014-08-04 DIAGNOSIS — Y998 Other external cause status: Secondary | ICD-10-CM | POA: Diagnosis not present

## 2014-08-04 DIAGNOSIS — E119 Type 2 diabetes mellitus without complications: Secondary | ICD-10-CM | POA: Diagnosis not present

## 2014-08-04 DIAGNOSIS — G40909 Epilepsy, unspecified, not intractable, without status epilepticus: Secondary | ICD-10-CM | POA: Insufficient documentation

## 2014-08-04 DIAGNOSIS — W050XXA Fall from non-moving wheelchair, initial encounter: Secondary | ICD-10-CM | POA: Insufficient documentation

## 2014-08-04 DIAGNOSIS — Z79899 Other long term (current) drug therapy: Secondary | ICD-10-CM | POA: Insufficient documentation

## 2014-08-04 DIAGNOSIS — S79912A Unspecified injury of left hip, initial encounter: Secondary | ICD-10-CM | POA: Diagnosis not present

## 2014-08-04 DIAGNOSIS — Z791 Long term (current) use of non-steroidal anti-inflammatories (NSAID): Secondary | ICD-10-CM | POA: Diagnosis not present

## 2014-08-04 DIAGNOSIS — S0990XA Unspecified injury of head, initial encounter: Secondary | ICD-10-CM | POA: Diagnosis present

## 2014-08-04 NOTE — ED Notes (Signed)
Bed: [id number] Expected date: 08/04/14 Expected time: 7:46 PM Means of arrival: Ambulance Comments: Fall, hematoma

## 2014-08-04 NOTE — Discharge Instructions (Signed)

## 2014-08-04 NOTE — ED Notes (Signed)
[facility] Patient states she fell out of her wheelchair and hit the left side of her head. Witnessed fall, no LOC denies any other injury. Patient is alert at baseline at this time per [facility], no blood thinners

## 2014-08-04 NOTE — ED Notes (Signed)
Patient verbalizes understanding of discharge instructions, home care and follow up care if needed. Patient out of here in ambulance back to nursing home.

## 2014-08-05 NOTE — ED Provider Notes (Signed)
CSN: [id number]     Arrival date & time 08/04/14  2001 History   First MD Initiated Contact with Patient 08/04/14 2002     Chief Complaint  Patient presents with  . Fall     (Consider location/radiation/quality/duration/timing/severity/associated sxs/prior Treatment) Patient is a 59 y.o. female presenting with fall. The history is provided by the patient.  Fall This is a new problem. The current episode started 1 to 2 hours ago. The problem occurs constantly. The problem has not changed since onset.Pertinent negatives include no abdominal pain and no shortness of breath. Nothing aggravates the symptoms. Nothing relieves the symptoms.    Past Medical History  Diagnosis Date  . Seizures   . Alcohol abuse   . Diabetes mellitus without complication   . Dementia with behavioral problem   . Arthritis   . Dysphagia     and aspiration risk  . 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 No pertinent past surgical history. History reviewed. No pertinent family history. History  Substance Use Topics  . Smoking status: Unknown If Ever Smoked  . Smokeless tobacco: Not on file  . Alcohol Use: No     Comment: occ   OB History    No data available     Review of Systems  Constitutional: Negative for fever.  Respiratory: Negative for cough and shortness of breath.   Gastrointestinal: Negative for vomiting and abdominal pain.  All other systems reviewed and are negative.     Allergies  Review of patient's allergies indicates no known allergies.  Home Medications   Prior to Admission medications   Medication Sig Start Date End Date Taking? Authorizing Provider  cholecalciferol (VITAMIN D) 1000 UNITS tablet Take 1,000 Units by mouth daily.   Yes Historical Provider, MD  diclofenac sodium (VOLTAREN) 1 % GEL Apply 2 g topically 4 (four) times daily. To both knees 06/03/14  Yes Sharee Holster, NP  ENSURE PLUS (ENSURE PLUS) LIQD Take 237 mLs by mouth 3 (three) times daily.   Yes  Historical Provider, MD  gabapentin (NEURONTIN) 300 MG capsule Take 300 mg by mouth 3 (three) times daily.   Yes Historical Provider, MD  gabapentin (NEURONTIN) 600 MG tablet Take 600 mg by mouth 3 (three) times daily.   Yes Historical Provider, MD  metFORMIN (GLUCOPHAGE-XR) 500 MG 24 hr tablet Take 500 mg by mouth daily with breakfast.   Yes Historical Provider, MD  Multiple Vitamin (MULTIVITAMIN WITH MINERALS) TABS tablet Take 1 tablet by mouth daily.   Yes Historical Provider, MD  nicotine (NICODERM CQ - DOSED IN MG/24 HOURS) 14 mg/24hr patch Place 14 mg onto the skin daily.   Yes Historical Provider, MD  OLANZapine (ZYPREXA) 10 MG tablet Take 10 mg by mouth at bedtime.   Yes Historical Provider, MD  thiamine 100 MG tablet Take 100 mg by mouth daily.   Yes Historical Provider, MD  traMADol (ULTRAM) 50 MG tablet Take one tablet by mouth three times daily for pain; Take one tablet by mouth once daily as needed for pain 07/06/14  Yes Sharon Seller, NP  acetaminophen (TYLENOL) 325 MG tablet Take 650 mg by mouth every 6 (six) hours as needed. For pain    Historical Provider, MD  aspirin 325 MG tablet Take 325 mg by mouth every 6 (six) hours as needed. For pain    Historical Provider, MD  gabapentin (NEURONTIN) 300 MG capsule Take 3 capsules (900 mg total) by mouth 3 (three) times daily. Patient  not taking: Reported on 08/04/2014 06/30/14   Sharee Holster, NP  magnesium hydroxide (MILK OF MAGNESIA) 400 MG/5ML suspension Take 30 mLs by mouth every 8 (eight) hours as needed for mild constipation.     Historical Provider, MD  Melatonin 3 MG TABS Take 2 tablets by mouth at bedtime as needed.     Historical Provider, MD  Ostomy Supplies (SKIN PREP WIPES) MISC 1 application by Does not apply route daily. To right heel blister    Historical Provider, MD  traZODone (DESYREL) 100 MG tablet Take 100 mg by mouth at bedtime.    Historical Provider, MD   BP 115/82 mmHg  Pulse 107  Temp(Src) 99.3 F (37.4 C)  (Oral)  Resp 18  Ht  (1.676 m)  Wt 110 lb (49.896 kg)  BMI 17.76 kg/m2  SpO2 96%  LMP 07/30/2014 Physical Exam  Constitutional: She appears well-developed and well-nourished. No distress.  HENT:  Head: Normocephalic.    Mouth/Throat: Oropharynx is clear and moist.  Eyes: EOM are normal. Pupils are equal, round, and reactive to light.  Neck: Normal range of motion. Neck supple.  Cardiovascular: Normal rate and regular rhythm.  Exam reveals no friction rub.   No murmur heard. Pulmonary/Chest: Effort normal and breath sounds normal. No respiratory distress. She has no wheezes. She has no rales.  Abdominal: Soft. She exhibits no distension. There is no tenderness. There is no rebound.  Musculoskeletal: Normal range of motion. She exhibits no edema.  Neurological: She is alert. She is disoriented (Disoriented to year, but follows commands, knows she's in the hospital). No cranial nerve deficit or sensory deficit.  Skin: She is not diaphoretic.  Nursing note and vitals reviewed.   ED Course  Procedures (including critical care time) Labs Review Labs Reviewed - No data to display  Imaging Review Dg Pelvis 1-2 Views  08/04/2014   CLINICAL DATA:  Patient fell out of wheelchair hitting the left side of the body. Left hip pain and left knee pain.  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 There is near complete loss of joint space with significant sclerosis, subcortical lucencies, contour deformity, and productive changes in both hips. No evidence of acute fracture or dislocation in the pelvis or hips.  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 No acute bony abnormalities demonstrated.   Electronically Signed   By: Burman Nieves M.D.   On: 08/04/2014 21:38   Dg Knee 1-2 Views Left  08/04/2014   CLINICAL DATA:  Left hip pain, left knee pain status post fall  EXAM: LEFT KNEE - 1-2 VIEW  COMPARISON:  None.  FINDINGS:  There is no evidence of fracture, dislocation, or joint effusion. There is severe osteopenia. There is no evidence of arthropathy or other focal bone abnormality. Soft tissues are unremarkable.  IMPRESSION: No acute osseous injury of the left knee.   Electronically Signed   By: Elige Ko   On: 08/04/2014 21:43   Ct Head Wo Contrast  08/04/2014   CLINICAL DATA:  fell out of her wheelchair and hit the left side of her head. Witnessed fall, no LOC denies any other injury, h/o dementia, seizures, not on blood thinners  EXAM: CT HEAD WITHOUT CONTRAST  TECHNIQUE: Contiguous axial images were obtained from the base of the skull through the vertex without intravenous contrast.  COMPARISON:  02/03/2014  FINDINGS: Atherosclerotic and physiologic intracranial calcifications. There is no evidence of acute intracranial  hemorrhage, brain edema, mass lesion, acute infarction, mass effect, or midline shift. Acute infarct may be inapparent on noncontrast CT. No other intra-axial abnormalities are seen, and the ventricles and sulci are within normal limits in size and symmetry. No abnormal extra-axial fluid collections or masses are identified. No significant calvarial abnormality.  IMPRESSION: 1. Negative for bleed or other acute intracranial process.   Electronically Signed   By: Corlis Leak  Hassell M.D.   On: 08/04/2014 21:49     EKG Interpretation None      MDM   Final diagnoses:  Fall    78F here s/p fall. Larey SeatFell forward out of her wheelchair. Had a hematoma to L temporal/parietal region. Witnessed by staff, no LOC.  On exam, she told me she fell, she also complained of L knee pain and L hip pain. No bony deformities. AFVSS here. Imaging all normal, no signs of acute trauma. Stable for discharge.    Elwin MochaBlair Treson Laura, MD 08/05/14 [id number]

## 2014-09-02 NOTE — Progress Notes (Signed)
Patient ID: Sarah OxfordFrieda Mckay, female   DOB: 11/13/1955, 59 y.o.   MRN: [id number]  [facility]     No Known Allergies     Chief Complaint  Patient presents with  . Medical Management of Chronic Issues    HPI:  She is a long term resident of this facility being seen for the management of her chronic illnesses. Overall her status remains stable. She is not voicing any complaints of pain or discomfort at this time; but she cannot fully participate in the hpi or ros. There are no nursing concerns at this time.     Past Medical History  Diagnosis Date  . Seizures   . Alcohol abuse   . Diabetes mellitus without complication   . Dementia with behavioral problem   . Arthritis   . Dysphagia     and aspiration risk  . 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  VITAL SIGNS BP 98/66 mmHg  Pulse 88  Ht 5\' 6"  (1.676 m)  Wt 100 lb (45.36 kg)  BMI 16.15 kg/m2  SpO2 96%   Outpatient Encounter Prescriptions as of 07/29/2014  Medication Sig  . acetaminophen (TYLENOL) 325 MG tablet Take 650 mg by mouth every 6 (six) hours as needed. For pain  . diclofenac sodium (VOLTAREN) 1 % GEL Apply 2 g topically 4 (four) times daily. To both knees  . gabapentin (NEURONTIN) 300 MG capsule Take 3 capsules (900 mg total) by mouth 3 (three) times daily.   . magnesium hydroxide (MILK OF MAGNESIA) 400 MG/5ML suspension Take 30 mLs by mouth every 8 (eight) hours as needed for mild constipation.   . Melatonin 3 MG TABS Take 2 tablets by mouth at bedtime as needed.   . metFORMIN (GLUCOPHAGE-XR) 500 MG 24 hr tablet Take 500 mg by mouth daily with breakfast.  . Multiple Vitamin (MULTIVITAMIN WITH MINERALS) TABS tablet Take 1 tablet by mouth daily.  Marland Kitchen. OLANZapine (ZYPREXA) 10 MG tablet Take 10 mg by mouth at bedtime.  Sherren Mocha. Ostomy Supplies (SKIN PREP WIPES) MISC 1 application by Does not apply route daily. To right heel blister  . thiamine 100 MG tablet Take 100 mg by mouth daily.  . traMADol  (ULTRAM) 50 MG tablet Take one tablet by mouth three times daily for pain; Take one tablet by mouth once daily as needed for pain  . traZODone (DESYREL) 100 MG tablet Take 100 mg by mouth at bedtime.     SIGNIFICANT DIAGNOSTIC EXAMS   LABS REVIEWED:   01-29-14: glucose 119; bun 13.1; creat 0.51; k+4.0; na++142; tsh 0.93; vit b12: 528; hgb a1c 6.4 04-10-14: total testosterone: 492;  Free testosterone: 60.5  06-15-14: wbc 12.6; hgb 12.0; hct 39.0; mcv 91; plt 278; glucose 107; bun 19.1; creat 0.47; k+4.1; na++143; liver normal albumin 4.4 06-17-14: urine for micro-albumin 7.4      ROS Unable to perform ROS    Physical Exam Constitutional: No distress.  Frail   Neck: Neck supple. No JVD present. No thyromegaly present.  Cardiovascular: Normal rate, regular rhythm and intact distal pulses.   Respiratory: Effort normal and breath sounds normal. No respiratory distress.  GI: Soft. Bowel sounds are normal. She exhibits no distension. There is no tenderness.  Musculoskeletal: She exhibits no edema.  Is able to move all extremities   Neurological: She is alert.  Skin: Skin is warm and dry. She is not diaphoretic.      ASSESSMENT/ PLAN:   1. Diabetes: she  is presently stable her hgb a1c 6.4; will continue metformin 500 gm daily;   and will monitor  Urine for micro-albumin is 7.4; is on lisinopril 2.5 mg daily   2. Dysphagia: no signs of aspiration present; will not make changes in her current plan of care and will monitor her status.   3. Chronic pain with osteoarthritis of both knees: she is  getting adequate pain relief: will continue voltaren gel 2 gm to both knees four times daily; will continue neurontin  900 mg three times daily; and will continue ultram  50 mg three times daily and will monitor her status.   4. Psychosis; due to alcohol: she is presently without change in status; will continue zyprexa 10 mg daily; thiamine 100 mg dialy; trazodone 100 mg nightly for sleep and  will monitor  5. 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   Will check cbc; hgb a1c    Synthia Innocent NP [facility]  Contact [phone] Monday through Friday 8am- 5pm  After hours call [phone]

## 2014-09-03 ENCOUNTER — Non-Acute Institutional Stay (SKILLED_NURSING_FACILITY): Payer: Medicaid Other | Admitting: Internal Medicine

## 2014-09-03 DIAGNOSIS — F10959 Alcohol use, unspecified with alcohol-induced psychotic disorder, unspecified: Secondary | ICD-10-CM | POA: Diagnosis not present

## 2014-09-03 DIAGNOSIS — M170 Bilateral primary osteoarthritis of knee: Secondary | ICD-10-CM

## 2014-09-03 DIAGNOSIS — E1140 Type 2 diabetes mellitus with diabetic neuropathy, unspecified: Secondary | ICD-10-CM | POA: Diagnosis not present

## 2014-09-03 DIAGNOSIS — F03918 Unspecified dementia, unspecified severity, with other behavioral disturbance: Secondary | ICD-10-CM

## 2014-09-03 DIAGNOSIS — F0391 Unspecified dementia with behavioral disturbance: Secondary | ICD-10-CM | POA: Diagnosis not present

## 2014-09-03 DIAGNOSIS — G8929 Other chronic pain: Secondary | ICD-10-CM

## 2014-09-03 DIAGNOSIS — G4700 Insomnia, unspecified: Secondary | ICD-10-CM

## 2014-09-03 NOTE — Progress Notes (Signed)
Patient ID: Sarah Mckay, female   DOB: 03/14/1956, 59 y.o.   MRN: [id number]   09[facility]03/14  [facility]     [facility] of Service: SN[facility] ([facility])   No Known Allergies  Chief Complaint  Patient presents with  . Medical Management of Chronic Issues    HPI:  59 yo female long term resident seen today for f/u. She is sitting in w/c. No c/o. Nursing issues. No recent falls. She is a poor historian due to dementia. Hx obtained from chart.  Takes neurontin for chronic pain and neuropathy. She is also taking tramadol and applying voltaren gel to joints.  CBG 152 today. No low BS reactions. She takes metformin.  She has past hx Etoh abuse and is taking thiamine supplement  She has insomnia and takes melatonin and trazodone  zyprexa controls psychosis  Past Medical History  Diagnosis Date  . Seizures   . Alcohol abuse   . Diabetes mellitus without complication   . Dementia with behavioral problem   . Arthritis   . Dysphagia     and aspiration risk  . 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 History   Social History  . Marital Status: Unknown    Spouse Name: N/A  . Number of Children: N/A  . Years of Education: N/A   Social History Main Topics  . Smoking status: Unknown If Ever Smoked  . Smokeless tobacco: Not on file  . Alcohol Use: No     Comment: occ  . Drug Use: No  . Sexual Activity: Not on file   Other Topics Concern  . None   Social History Narrative   ** Merged History Encounter **         Medications: Patient's Medications  New Prescriptions   No medications on file  Previous Medications   ACETAMINOPHEN (TYLENOL) 325 MG TABLET    Take 650 mg by mouth every 6 (six) hours as needed. For pain   CHOLECALCIFEROL (VITAMIN D) 1000 UNITS TABLET    Take 1,000 Units by mouth daily.   DICLOFENAC SODIUM (VOLTAREN) 1 % GEL    Apply 2 g topically 4 (four) times daily. To both knees   ENSURE PLUS (ENSURE PLUS) LIQD    Take  120 mLs by mouth 4 (four) times daily.    GABAPENTIN (NEURONTIN) 300 MG CAPSULE    Take 3 capsules (900 mg total) by mouth 3 (three) times daily.   MAGNESIUM HYDROXIDE (MILK OF MAGNESIA) 400 MG/5ML SUSPENSION    Take 30 mLs by mouth every 8 (eight) hours as needed for mild constipation.    MELATONIN 3 MG TABS    Take 2 tablets by mouth at bedtime as needed.    METFORMIN (GLUCOPHAGE-XR) 500 MG 24 HR TABLET    Take 500 mg by mouth daily with breakfast.   MULTIPLE VITAMIN (MULTIVITAMIN WITH MINERALS) TABS TABLET    Take 1 tablet by mouth daily.   OLANZAPINE (ZYPREXA) 10 MG TABLET    Take 5 mg by mouth at bedtime.    THIAMINE 100 MG TABLET    Take 100 mg by mouth daily.   TRAMADOL (ULTRAM) 50 MG TABLET    Take one tablet by mouth three times daily for pain; Take one tablet by mouth once daily as needed for pain   TRAZODONE (DESYREL) 100 MG TABLET    Take 100 mg by mouth at bedtime.  Modified Medications   No medications on file  Discontinued Medications   ASPIRIN 325 MG TABLET    Take 325 mg by mouth every 6 (six) hours as needed. For pain   GABAPENTIN (NEURONTIN) 300 MG CAPSULE    Take 300 mg by mouth 3 (three) times daily.   GABAPENTIN (NEURONTIN) 600 MG TABLET    Take 600 mg by mouth 3 (three) times daily.   NICOTINE (NICODERM CQ - DOSED IN MG/24 HOURS) 14 MG/24HR PATCH    Place 14 mg onto the skin daily.   OSTOMY SUPPLIES (SKIN PREP WIPES) MISC    1 application by Does not apply route daily. To right heel blister     Review of Systems  Unable to perform ROS: Dementia    Filed Vitals:   09/03/14 1428  BP: 126/78  Pulse: 98  Temp: 97.6 F (36.4 C)  Weight: 95 lb (43.092 kg)  SpO2: 96%   Body mass index is 15.34 kg/(m^2).  Physical Exam  Constitutional: She appears well-developed.  Frail appearing in NAD  HENT:  Mouth/Throat: Oropharynx is clear and moist. No oropharyngeal exudate.  Eyes: Pupils are equal, round, and reactive to light. No scleral icterus.  Neck: Neck supple. No  tracheal deviation present. No thyromegaly present.  Cardiovascular: Normal rate, regular rhythm, normal heart sounds and intact distal pulses.  Exam reveals no gallop and no friction rub.   No murmur heard. +1 pitting LLE edema but no RLE edema. no calf TTP. No carotid bruit b/l  Pulmonary/Chest: Effort normal and breath sounds normal. No stridor. No respiratory distress. She has no wheezes. She has no rales.  Abdominal: Soft. Bowel sounds are normal. She exhibits no distension and no mass. There is no tenderness. There is no rebound and no guarding.  Lymphadenopathy:    She has no cervical adenopathy.  Neurological: She is alert. She has normal reflexes.  Skin: Skin is warm and dry. No rash noted.  Psychiatric: Her behavior is normal. She exhibits a depressed mood.     Labs reviewed: No results found for any previous visit.   Assessment/Plan   ICD-9-CM ICD-10-CM   1. Dementia with behavioral problem - cont meds 294.21 F03.91   2. 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 Chronic pain - stable; cont meds 338.29 G89.29   4. Type 2 diabetes mellitus with diabetic neuropathy - stable; cont metformin 250.60 E11.40    357.2    5. Insomnia - stable; cont meds 780.52 G47.00   6. Primary osteoarthritis of both knees - cont voltaren gel 715.16 M17.0     --Pt is medically stable on current tx plan. Continue current medications as ordered. PT/OT/ST wjen indicated. Will follow  Jemiah Cuadra S. Ancil Linsey  [facility] [address] [place], [place] [address] [phone] Office (Wednesdays and Fridays 8 AM - 5 PM) [phone] Cell (Monday-Friday 8 AM - 5 PM)

## 2014-09-16 ENCOUNTER — Ambulatory Visit (HOSPITAL_COMMUNITY): Admission: RE | Admit: 2014-09-16 | Payer: Medicaid Other | Source: Ambulatory Visit

## 2014-09-16 ENCOUNTER — Encounter: Payer: Self-pay | Admitting: Adult Health

## 2014-09-16 ENCOUNTER — Other Ambulatory Visit: Payer: Self-pay | Admitting: Internal Medicine

## 2014-09-16 ENCOUNTER — Other Ambulatory Visit: Payer: Self-pay | Admitting: Adult Health

## 2014-09-16 ENCOUNTER — Non-Acute Institutional Stay (SKILLED_NURSING_FACILITY): Payer: Medicaid Other | Admitting: Adult Health

## 2014-09-16 DIAGNOSIS — T798XXA Other early complications of trauma, initial encounter: Secondary | ICD-10-CM

## 2014-09-16 DIAGNOSIS — M869 Osteomyelitis, unspecified: Secondary | ICD-10-CM

## 2014-09-16 DIAGNOSIS — L89214 Pressure ulcer of right hip, stage 4: Secondary | ICD-10-CM | POA: Insufficient documentation

## 2014-09-16 NOTE — Progress Notes (Signed)
Patient ID: Sarah Mckay, female   DOB: 1955-11-22, 59 y.o.   MRN: [id number]  [facility]     No Known Allergies     Chief Complaint  Patient presents with  . Acute Visit    right hip wound     HPI:  She has a new stage IV wound on her right hip. This area started out as an abrasion. She needs cushions in her wheelchair in order for her to maintain position in her wheelchair so she won't fall face forward. Her current weight is 99 pounds; her pre-albumin is low at 17; which makes her susceptible to skin breakdown. The wound has a very foul which can be smelled in the hallway. She is not complaining of pain at this time.     Past Medical History  Diagnosis Date  . Seizures   . Alcohol abuse   . Diabetes mellitus without complication   . Dementia with behavioral problem   . Arthritis   . Dysphagia     and aspiration risk  . 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  VITAL SIGNS BP 102/60 mmHg  Pulse 76  Ht  (1.676 m)  Wt 99 lb (44.906 kg)  BMI 15.99 kg/m2   Outpatient Encounter Prescriptions as of 09/16/2014  Medication Sig  . acetaminophen (TYLENOL) 325 MG tablet Take 650 mg by mouth every 6 (six) hours as needed. For pain  . cholecalciferol (VITAMIN D) 1000 UNITS tablet Take 1,000 Units by mouth daily.  . diclofenac sodium (VOLTAREN) 1 % GEL Apply 2 g topically 4 (four) times daily. To both knees  . ENSURE PLUS (ENSURE PLUS) LIQD Take 120 mLs by mouth 4 (four) times daily.   Marland Kitchen gabapentin (NEURONTIN) 300 MG capsule Take 3 capsules (900 mg total) by mouth 3 (three) times daily.  . magnesium hydroxide (MILK OF MAGNESIA) 400 MG/5ML suspension Take 30 mLs by mouth every 8 (eight) hours as needed for mild constipation.   . Melatonin 3 MG TABS Take 1 tablets by mouth at bedtime as needed.   . metFORMIN (GLUCOPHAGE-XR) 500 MG 24 hr tablet Take 500 mg by mouth daily with breakfast.  . Multiple Vitamin (MULTIVITAMIN WITH MINERALS) TABS tablet  Take 1 tablet by mouth daily.  Marland Kitchen OLANZapine (ZYPREXA) 10 MG tablet Take 5 mg by mouth at bedtime.   . thiamine 100 MG tablet Take 100 mg by mouth daily.  . traMADol (ULTRAM) 50 MG tablet  Take 50 mg by mouth 3 (three) times daily. Take one tablet by mouth three times daily for pain; and every 6 hours as needed  . traZODone (DESYREL) 100 MG tablet Take 100 mg by mouth at bedtime.     SIGNIFICANT DIAGNOSTIC EXAMS   LABS REVIEWED:   01-29-14: glucose 119; bun 13.1; creat 0.51; k+4.0; na++142; tsh 0.93; vit b12: 528; hgb a1c 6.4 04-10-14: total testosterone: 492;  Free testosterone: 60.5  06-15-14: wbc 12.6; hgb 12.0; hct 39.0; mcv 91; plt 278; glucose 107; bun 19.1; creat 0.47; k+4.1; na++143; liver normal albumin 4.4 06-17-14: urine for micro-albumin 7.4  08-01-14: wbc 8.8; hgb 10.2; hct 34.6; mcv 90.7; plt 358; vit b12: 783; folic acid 13.5; ferritin 249.30;  iron 48; tibc 222; retic 0.55  08-12-14: stool guaiac: neg 09-15-14: pre-albumin 17       ROS Unable to perform ROS   Physical Exam Constitutional: No distress.  Frail   Neck: Neck supple. No JVD present. No thyromegaly present.  Cardiovascular: Normal rate, regular rhythm and intact distal pulses.   Respiratory: Effort normal and breath sounds normal. No respiratory distress.  GI: Soft. Bowel sounds are normal. She exhibits no distension. There is no tenderness.  Musculoskeletal: She exhibits no edema.  Is able to move all extremities   Neurological: She is alert.  Skin: Skin is warm and dry. She is not diaphoretic. Right hip stage IV pressure ulcer:5.0 x 3.6 x 0.13 cm with slough wound bed; very foul odor present; peri-wound area is inflamed and slightly warm to touch.     ASSESSMENT/ PLAN:  1. Stage IV right hip pressure ulcer:  will set up ct scan without contrast on right hip and pelvis for possible osteomyelitis;  stat cbc and cmp;  will insert picc line;  will begin pro stat 30 cc three times daily;  primaxin  500 mg IV every 8 hours for 6 weeks Vancomycin 500 mg IV every 12 hours for 6 weeks pharmacy to dose   Time spent with patient 45 minutes.   Synthia Innocenteborah Donatella Walski NP [facility]  Contact [phone] Monday through Friday 8am- 5pm  After hours call [phone]

## 2014-09-17 ENCOUNTER — Other Ambulatory Visit: Payer: Self-pay | Admitting: Internal Medicine

## 2014-09-17 ENCOUNTER — Other Ambulatory Visit: Payer: Self-pay | Admitting: Adult Health

## 2014-09-17 ENCOUNTER — Inpatient Hospital Stay (HOSPITAL_COMMUNITY): Admission: RE | Admit: 2014-09-17 | Payer: Medicaid Other | Source: Ambulatory Visit

## 2014-09-17 ENCOUNTER — Ambulatory Visit (HOSPITAL_COMMUNITY)
Admission: RE | Admit: 2014-09-17 | Discharge: 2014-09-17 | Disposition: A | Discharge status: Home / Self Care | Payer: Medicaid Other | Source: Ambulatory Visit | Attending: Interventional Radiology | Admitting: Interventional Radiology

## 2014-09-17 DIAGNOSIS — X58XXXA Exposure to other specified factors, initial encounter: Secondary | ICD-10-CM | POA: Diagnosis not present

## 2014-09-17 DIAGNOSIS — T798XXA Other early complications of trauma, initial encounter: Secondary | ICD-10-CM

## 2014-09-17 DIAGNOSIS — Z452 Encounter for adjustment and management of vascular access device: Secondary | ICD-10-CM | POA: Insufficient documentation

## 2014-09-17 DIAGNOSIS — M869 Osteomyelitis, unspecified: Secondary | ICD-10-CM

## 2014-09-17 MED ORDER — LIDOCAINE HCL 1 % IJ SOLN
INTRAMUSCULAR | Status: AC
  Filled 2014-09-17: qty 20

## 2014-09-17 MED ORDER — HEPARIN SOD (PORK) LOCK FLUSH 100 UNIT/ML IV SOLN
500.0000 [IU] | Freq: Once | INTRAVENOUS | Status: AC
  Administered 2014-09-17: 500 [IU]

## 2014-09-17 MED ORDER — HEPARIN SOD (PORK) LOCK FLUSH 100 UNIT/ML IV SOLN
INTRAVENOUS | Status: AC
  Administered 2014-09-17: 500 [IU]
  Filled 2014-09-17: qty 5

## 2014-09-17 MED ORDER — HEPARIN SOD (PORK) LOCK FLUSH 100 UNIT/ML IV SOLN
INTRAVENOUS | Status: AC
  Filled 2014-09-17: qty 5

## 2014-09-17 NOTE — Procedures (Signed)
R arm PowerPICC placed under US and fluoroscopy No ptx on spot chest radiograph. No complication No blood loss. See complete dictation in [facility] PACS.  

## 2014-09-18 ENCOUNTER — Ambulatory Visit (HOSPITAL_COMMUNITY): Payer: Medicaid Other

## 2014-09-22 ENCOUNTER — Ambulatory Visit (HOSPITAL_COMMUNITY): Payer: Medicaid Other

## 2014-09-28 ENCOUNTER — Ambulatory Visit (HOSPITAL_COMMUNITY): Payer: Medicaid Other

## 2014-09-29 ENCOUNTER — Non-Acute Institutional Stay (SKILLED_NURSING_FACILITY): Payer: Medicaid Other | Admitting: Adult Health

## 2014-09-29 DIAGNOSIS — L89214 Pressure ulcer of right hip, stage 4: Secondary | ICD-10-CM

## 2014-09-29 DIAGNOSIS — R1310 Dysphagia, unspecified: Secondary | ICD-10-CM

## 2014-09-29 DIAGNOSIS — E1140 Type 2 diabetes mellitus with diabetic neuropathy, unspecified: Secondary | ICD-10-CM

## 2014-09-29 DIAGNOSIS — M170 Bilateral primary osteoarthritis of knee: Secondary | ICD-10-CM | POA: Diagnosis not present

## 2014-09-29 DIAGNOSIS — G8929 Other chronic pain: Secondary | ICD-10-CM

## 2014-09-29 DIAGNOSIS — F10959 Alcohol use, unspecified with alcohol-induced psychotic disorder, unspecified: Secondary | ICD-10-CM

## 2014-09-29 DIAGNOSIS — F0391 Unspecified dementia with behavioral disturbance: Secondary | ICD-10-CM | POA: Diagnosis not present

## 2014-09-29 DIAGNOSIS — F03918 Unspecified dementia, unspecified severity, with other behavioral disturbance: Secondary | ICD-10-CM

## 2014-10-08 ENCOUNTER — Ambulatory Visit (HOSPITAL_COMMUNITY): Payer: Medicaid Other

## 2014-10-09 ENCOUNTER — Encounter (HOSPITAL_COMMUNITY): Admission: RE | Admit: 2014-10-09 | Payer: Medicaid Other | Source: Ambulatory Visit

## 2014-10-09 ENCOUNTER — Encounter (HOSPITAL_COMMUNITY): Payer: Medicaid Other

## 2014-10-22 ENCOUNTER — Encounter: Payer: Self-pay | Admitting: Internal Medicine

## 2014-10-22 DIAGNOSIS — E1149 Type 2 diabetes mellitus with other diabetic neurological complication: Secondary | ICD-10-CM | POA: Insufficient documentation

## 2014-11-03 NOTE — Progress Notes (Signed)
Patient ID: Sarah OxfordFrieda Mckay, female   DOB: 03/20/1956, 59 y.o.   MRN: [id number]  [facility]     No Known Allergies     Chief Complaint  Patient presents with  . Medical Management of Chronic Issues    HPI:  She is a long term resident of this facility being seen for the management of her chronic illnesses. Her right  hip is improving with her current treatment and her iv abt. Her pain is presently being managed. She is unable to fully participate in the hpi or ros; but states that she is feeling good. There are no nursing concerns at this time.    Past Medical History  Diagnosis Date  . Seizures   . Alcohol abuse   . Diabetes mellitus without complication   . Dementia with behavioral problem   . Arthritis   . Dysphagia     and aspiration risk  . 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  VITAL SIGNS BP 99/76 mmHg  Pulse 62  Ht 5\' 6"  (1.676 m)  Wt 100 lb (45.36 kg)  BMI 16.15 kg/m2  SpO2 96%   Outpatient Encounter Prescriptions as of 09/29/2014  Medication Sig  . acetaminophen (TYLENOL) 325 MG tablet Take 650 mg by mouth every 6 (six) hours as needed. For pain  . cholecalciferol (VITAMIN D) 1000 UNITS tablet Take 1,000 Units by mouth daily.  . diclofenac sodium (VOLTAREN) 1 % GEL Apply 2 g topically 4 (four) times daily. To both knees  . ENSURE PLUS (ENSURE PLUS) LIQD Take 120 mLs by mouth 4 (four) times daily.   Marland Kitchen. gabapentin (NEURONTIN) 300 MG capsule Take 3 capsules (900 mg total) by mouth 3 (three) times daily.  . magnesium hydroxide (MILK OF MAGNESIA) 400 MG/5ML suspension Take 30 mLs by mouth every 8 (eight) hours as needed for mild constipation.   . Melatonin 3 MG TABS Take 2 tablets by mouth at bedtime as needed.   . metFORMIN (GLUCOPHAGE-XR) 500 MG 24 hr tablet Take 500 mg by mouth daily with breakfast.  . Multiple Vitamin (MULTIVITAMIN WITH MINERALS) TABS tablet Take 1 tablet by mouth daily.  Marland Kitchen. OLANZapine (ZYPREXA) 10 MG tablet Take 5  mg by mouth at bedtime.   . thiamine 100 MG tablet Take 100 mg by mouth daily.  . traMADol (ULTRAM) 50 MG tablet  Take one tablet by mouth three times daily for pain; and every 6 ours as needed  . traZODone (DESYREL) 100 MG tablet Take 100 mg by mouth at bedtime.      SIGNIFICANT DIAGNOSTIC EXAMS   LABS REVIEWED:   01-29-14: glucose 119; bun 13.1; creat 0.51; k+4.0; na++142; tsh 0.93; vit b12: 528; hgb a1c 6.4 04-10-14: total testosterone: 492;  Free testosterone: 60.5  06-15-14: wbc 12.6; hgb 12.0; hct 39.0; mcv 91; plt 278; glucose 107; bun 19.1; creat 0.47; k+4.1; na++143; liver normal albumin 4.4 06-17-14: urine for micro-albumin 7.4  08-12-14: guaiac stool: neg 09-15-14: pre-albumin 17 09-16-14: wbc 9.7; hgb 11.0; hct 34.4; mcv 88.2; plt 319; glucose 106; bun 15; creat 0.6; k+4.3; na++138; liver normal albumin 3.8     Review of Systems  Unable to perform ROS    Physical Exam Constitutional: No distress.  Frail   Neck: Neck supple. No JVD present. No thyromegaly present.  Cardiovascular: Normal rate, regular rhythm and intact distal pulses.   Respiratory: Effort normal and breath sounds normal. No respiratory distress.  GI: Soft. Bowel sounds are normal.  She exhibits no distension. There is no tenderness.  Musculoskeletal: She exhibits no edema.  Is able to move all extremities   Neurological: She is alert.  Skin: Skin is warm and dry. She is not diaphoretic. Right hip ulcer: significantly less slough; and odor present 4.4 x 3.0 x 1 cm 75% slough and 25% granulating tissue        ASSESSMENT/ PLAN:  1. Diabetes: she is presently stable her hgb a1c 6.4; will continue metformin 500 mg daily;   and will monitor  Urine for micro-albumin is 7.4; is on lisinopril 2.5 mg daily   2. Dysphagia: no signs of aspiration present; will not make changes in her current plan of care and will monitor her status.   3. Chronic pain with osteoarthritis of both knees: she is  getting  adequate pain relief: will continue voltaren gel 2 gm to both knees four times daily; will continue neurontin  900 mg three times daily; and will continue ultram  50 mg three times daily and every 6 hours as needed; and will monitor her status.   4. Psychosis; due to alcohol: she is presently without change in status; will continue zyprexa 10 mg daily; thiamine 100 mg dialy; trazodone 100 mg nightly for sleep and will monitor  5. 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   6. FTT: her current weight is 100 pounds: will continue supplements per facility protocol and will continue to monitor her status.   7. Right  hip stage IV wound: will complete cubicin and vancomycin  on 10-18-14; will continue wound care per facility protocol and supplements per facility protocol and will monitor her status.       Synthia Innocent NP [facility]  Contact [phone] Monday through Friday 8am- 5pm  After hours call [phone]

## 2014-11-05 ENCOUNTER — Non-Acute Institutional Stay (SKILLED_NURSING_FACILITY): Payer: Medicaid Other | Admitting: Adult Health

## 2014-11-05 ENCOUNTER — Encounter: Payer: Self-pay | Admitting: Adult Health

## 2014-11-05 DIAGNOSIS — F10959 Alcohol use, unspecified with alcohol-induced psychotic disorder, unspecified: Secondary | ICD-10-CM

## 2014-11-05 DIAGNOSIS — G8929 Other chronic pain: Secondary | ICD-10-CM

## 2014-11-05 DIAGNOSIS — F03918 Unspecified dementia, unspecified severity, with other behavioral disturbance: Secondary | ICD-10-CM

## 2014-11-05 DIAGNOSIS — M170 Bilateral primary osteoarthritis of knee: Secondary | ICD-10-CM

## 2014-11-05 DIAGNOSIS — R1310 Dysphagia, unspecified: Secondary | ICD-10-CM

## 2014-11-05 DIAGNOSIS — F0391 Unspecified dementia with behavioral disturbance: Secondary | ICD-10-CM | POA: Diagnosis not present

## 2014-11-05 DIAGNOSIS — L89214 Pressure ulcer of right hip, stage 4: Secondary | ICD-10-CM

## 2014-11-05 DIAGNOSIS — E1140 Type 2 diabetes mellitus with diabetic neuropathy, unspecified: Secondary | ICD-10-CM

## 2014-11-05 NOTE — Progress Notes (Signed)
Patient ID: Sarah Mckay, female   DOB: [birth year], 59 y.o.   MRN: [id number]  [facility]     No Known Allergies     Chief Complaint  Patient presents with  . Medical Management of Chronic Issues    HPI:  She is a long term resident of this facility being seen for the management of her chronic illnesses. Her right hip ulceration now has a wound vac in place and is followed by the wound doctor at the facility. She cannot fully participate in the hpi or ros. There are no nursing concerns at this time.    Past Medical History  Diagnosis Date  . Seizures   . Alcohol abuse   . Diabetes mellitus without complication   . Dementia with behavioral problem   . Arthritis   . Dysphagia     and aspiration risk  . 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  VITAL SIGNS BP 102/60 mmHg  Pulse 86  Ht 5\' 6"  (1.676 m)  Wt 109 lb (49.442 kg)  BMI 17.60 kg/m2  SpO2 97%   Outpatient Encounter Prescriptions as of 11/05/2014  Medication Sig  . cholecalciferol (VITAMIN D) 1000 UNITS tablet Take 1,000 Units by mouth daily.  . diclofenac sodium (VOLTAREN) 1 % GEL Apply 2 g topically 4 (four) times daily. To both knees  . gabapentin (NEURONTIN) 300 MG capsule Take 3 capsules (900 mg total) by mouth 3 (three) times daily.  . magnesium hydroxide (MILK OF MAGNESIA) 400 MG/5ML suspension Take 30 mLs by mouth every 8 (eight) hours as needed for mild constipation.   . Melatonin 3 MG TABS Take 2 tablets by mouth at bedtime as needed.   . metFORMIN (GLUCOPHAGE-XR) 500 MG 24 hr tablet Take 500 mg by mouth daily with breakfast.  . Multiple Vitamin (MULTIVITAMIN WITH MINERALS) TABS tablet Take 1 tablet by mouth daily.  Marland Kitchen OLANZapine (ZYPREXA) 10 MG tablet Take 7.5 mg by mouth at bedtime.   . rivaroxaban (XARELTO) 20 MG TABS tablet Take 20 mg by mouth daily with supper.  . thiamine 100 MG tablet Take 100 mg by mouth daily.  . traMADol (ULTRAM) 50 MG tablet  Take 50 mg by mouth 3  (three) times daily. Take one tablet by mouth three times daily for pain; and every 6 ours as needed)  . traZODone (DESYREL) 100 MG tablet Take 100 mg by mouth at bedtime.      SIGNIFICANT DIAGNOSTIC EXAMS  10-02-14: left leg doppler: +dvt  10-08-14: bilateral hip /pelvic fracture: right hip with marked osteoarthritis   LABS REVIEWED:   01-29-14: glucose 119; bun 13.1; creat 0.51; k+4.0; na++142; tsh 0.93; vit b12: 528; hgb a1c 6.4 04-10-14: total testosterone: 492;  Free testosterone: 60.5  06-15-14: wbc 12.6; hgb 12.0; hct 39.0; mcv 91; plt 278; glucose 107; bun 19.1; creat 0.47; k+4.1; na++143; liver normal albumin 4.4 06-17-14: urine for micro-albumin 7.4  08-12-14: guaiac stool: neg 09-15-14: pre-albumin 17 09-16-14: wbc 9.7; hgb 11.0; hct 34.4; mcv 88.2; plt 319; glucose 106; bun 15; creat 0.6; k+4.3; na++138; liver normal albumin 3.8       Review of Systems  Unable to perform ROS: dementia      Physical Exam Constitutional: No distress.  Frail   Neck: Neck supple. No JVD present. No thyromegaly present.  Cardiovascular: Normal rate, regular rhythm and intact distal pulses.   Respiratory: Effort normal and breath sounds normal. No respiratory distress.  GI: Soft. Bowel sounds  are normal. She exhibits no distension. There is no tenderness.  Musculoskeletal: She exhibits no edema.  Is able to move all extremities   Neurological: She is alert.  Skin: Skin is warm and dry. She is not diaphoretic. Right hip ulcer: 3.2 x 2.8 x 0.13 cm has wound vac    ASSESSMENT/ PLAN:  1. Diabetes: she is presently stable her hgb a1c 6.4; will continue metformin 500 mg daily;   and will monitor  Urine for micro-albumin is 7.4; is on lisinopril 2.5 mg daily   2. Dysphagia: no signs of aspiration present; will not make changes in her current plan of care and will monitor her status.   3. Chronic pain with osteoarthritis of both knees: she is  getting adequate pain relief: will continue  voltaren gel 2 gm to both knees four times daily; will continue neurontin  900 mg three times daily; and will continue ultram  50 mg three times daily and every 6 hours as needed; and will monitor her status.   4. Psychosis; due to alcohol: she is presently without change in status; will continue zyprexa 10 mg daily; thiamine 100 mg daily; trazodone 100 mg nightly for sleep and will monitor  5. 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   6. FTT: her current weight is 109 pounds: will continue supplements per facility protocol and will continue to monitor her status.   7. Right  hip stage IV wound: will complete cubicin and vancomycin  on 10-18-14; will continue wound care per facility protocol and supplements per facility protocol and will monitor her status.      Synthia Innocent NP [facility]  Contact [phone] Monday through Friday 8am- 5pm  After hours call [phone]

## 2014-12-03 ENCOUNTER — Non-Acute Institutional Stay (SKILLED_NURSING_FACILITY): Payer: Medicaid Other | Admitting: Adult Health

## 2014-12-03 DIAGNOSIS — M170 Bilateral primary osteoarthritis of knee: Secondary | ICD-10-CM

## 2014-12-03 DIAGNOSIS — F03918 Unspecified dementia, unspecified severity, with other behavioral disturbance: Secondary | ICD-10-CM

## 2014-12-03 DIAGNOSIS — F0391 Unspecified dementia with behavioral disturbance: Secondary | ICD-10-CM | POA: Diagnosis not present

## 2014-12-03 DIAGNOSIS — L89214 Pressure ulcer of right hip, stage 4: Secondary | ICD-10-CM | POA: Diagnosis not present

## 2014-12-03 DIAGNOSIS — R1310 Dysphagia, unspecified: Secondary | ICD-10-CM | POA: Diagnosis not present

## 2014-12-03 DIAGNOSIS — G8929 Other chronic pain: Secondary | ICD-10-CM

## 2014-12-03 DIAGNOSIS — E1140 Type 2 diabetes mellitus with diabetic neuropathy, unspecified: Secondary | ICD-10-CM

## 2014-12-03 DIAGNOSIS — F10959 Alcohol use, unspecified with alcohol-induced psychotic disorder, unspecified: Secondary | ICD-10-CM

## 2014-12-28 ENCOUNTER — Other Ambulatory Visit: Payer: Self-pay | Admitting: *Deleted

## 2014-12-28 DIAGNOSIS — G8929 Other chronic pain: Secondary | ICD-10-CM

## 2014-12-28 DIAGNOSIS — M170 Bilateral primary osteoarthritis of knee: Secondary | ICD-10-CM

## 2014-12-28 MED ORDER — TRAMADOL HCL 50 MG PO TABS: ORAL_TABLET | ORAL | Status: DC

## 2014-12-28 NOTE — Telephone Encounter (Signed)
[facility] 

## 2015-01-07 ENCOUNTER — Non-Acute Institutional Stay (SKILLED_NURSING_FACILITY): Payer: Medicaid Other | Admitting: Internal Medicine

## 2015-01-07 DIAGNOSIS — G8929 Other chronic pain: Secondary | ICD-10-CM

## 2015-01-07 DIAGNOSIS — F10959 Alcohol use, unspecified with alcohol-induced psychotic disorder, unspecified: Secondary | ICD-10-CM

## 2015-01-07 DIAGNOSIS — F03918 Unspecified dementia, unspecified severity, with other behavioral disturbance: Secondary | ICD-10-CM

## 2015-01-07 DIAGNOSIS — M170 Bilateral primary osteoarthritis of knee: Secondary | ICD-10-CM | POA: Diagnosis not present

## 2015-01-07 DIAGNOSIS — R627 Adult failure to thrive: Secondary | ICD-10-CM | POA: Diagnosis not present

## 2015-01-07 DIAGNOSIS — L89214 Pressure ulcer of right hip, stage 4: Secondary | ICD-10-CM

## 2015-01-07 DIAGNOSIS — E1140 Type 2 diabetes mellitus with diabetic neuropathy, unspecified: Secondary | ICD-10-CM | POA: Diagnosis not present

## 2015-01-07 DIAGNOSIS — F0391 Unspecified dementia with behavioral disturbance: Secondary | ICD-10-CM

## 2015-02-09 ENCOUNTER — Encounter: Payer: Self-pay | Admitting: Adult Health

## 2015-02-09 NOTE — Progress Notes (Signed)
Patient ID: Sarah Mckay, female   DOB: 1956-02-20, 59 y.o.   MRN: [id number]    Facility: [facility]      No Known Allergies  Chief Complaint  Patient presents with  . Medical Management of Chronic Issues    HPI:  She is a long term resident of this facility being seen for the management of her chronic illnesses. She continues to receive wound care for her right hip ulceration per the facility's wound doctor. She is unable to fully participate in the hpi or ros; but states that she is feeling good. There are no nursing concerns at this time.     Past Medical History  Diagnosis Date  . Seizures   . Alcohol abuse   . Diabetes mellitus without complication   . Dementia with behavioral problem   . Arthritis   . Dysphagia     and aspiration risk  . 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  VITAL SIGNS BP 101/64 mmHg  Pulse 100  Ht  (1.676 m)  Wt 108 lb (48.988 kg)  BMI 17.44 kg/m2  SpO2 98%  Patient's Medications  New Prescriptions   No medications on file  Previous Medications   CHOLECALCIFEROL (VITAMIN D) 1000 UNITS TABLET    Take 1,000 Units by mouth daily.   DICLOFENAC SODIUM (VOLTAREN) 1 % GEL    Apply 2 g topically 4 (four) times daily. To both knees   GABAPENTIN (NEURONTIN) 300 MG CAPSULE    Take 3 capsules (900 mg total) by mouth 3 (three) times daily.   MAGNESIUM HYDROXIDE (MILK OF MAGNESIA) 400 MG/5ML SUSPENSION    Take 30 mLs by mouth every 8 (eight) hours as needed for mild constipation.    MELATONIN 3 MG TABS    Take 2 tablets by mouth at bedtime as needed.    METFORMIN (GLUCOPHAGE-XR) 500 MG 24 HR TABLET    Take 500 mg by mouth daily with breakfast.   MULTIPLE VITAMIN (MULTIVITAMIN WITH MINERALS) TABS TABLET    Take 1 tablet by mouth daily.   OLANZAPINE (ZYPREXA) 10 MG TABLET    Take 7.5 mg by mouth at bedtime.    RIVAROXABAN (XARELTO) 20 MG TABS TABLET    Take 20 mg by mouth daily with supper.   THIAMINE 100  MG TABLET    Take 100 mg by mouth daily.   TRAMADOL (ULTRAM) 50 MG TABLET    Take one tablet by mouth three times daily for pain; Take one tablet by mouth every 6 hours  as needed for pain. Max  Tramadol/day   TRAZODONE (DESYREL) 100 MG TABLET    Take 100 mg by mouth at bedtime.  Modified Medications   No medications on file  Discontinued Medications   No medications on file     SIGNIFICANT DIAGNOSTIC EXAMS  10-02-14: left leg doppler: +dvt  10-08-14: bilateral hip /pelvic fracture: right hip with marked osteoarthritis   LABS REVIEWED:   01-29-14: glucose 119; bun 13.1; creat 0.51; k+4.0; na++142; tsh 0.93; vit b12: 528; hgb a1c 6.4 04-10-14: total testosterone: 492;  Free testosterone: 60.5  06-15-14: wbc 12.6; hgb 12.0; hct 39.0; mcv 91; plt 278; glucose 107; bun 19.1; creat 0.47; k+4.1; na++143; liver normal albumin 4.4 06-17-14: urine for micro-albumin 7.4  08-12-14: guaiac stool: neg 09-15-14: pre-albumin 17 09-16-14: wbc 9.7; hgb 11.0; hct 34.4; mcv 88.2; plt 319; glucose 106; bun 15; creat 0.6; k+4.3; na++138; liver normal albumin 3.8  11-06-14: pre-albumin 7     Review of Systems Unable to perform ROS: dementia    Physical Exam Constitutional: No distress.  Frail   Neck: Neck supple. No JVD present. No thyromegaly present.  Cardiovascular: Normal rate, regular rhythm and intact distal pulses.   Respiratory: Effort normal and breath sounds normal. No respiratory distress.  GI: Soft. Bowel sounds are normal. She exhibits no distension. There is no tenderness.  Musculoskeletal: She exhibits no edema.  Is able to move all extremities   Neurological: She is alert.  Skin: Skin is warm and dry. She is not diaphoretic. Right hip ulcer: 2.4 x 2.2 x 0.13 cm 65% granulation; 35% yellow slough  has wound vac       ASSESSMENT/ PLAN:  1. Diabetes: she is presently stable her hgb a1c 6.4; will continue metformin 500 mg daily;   and will monitor  Urine for micro-albumin is 7.4;  is on lisinopril 2.5 mg daily   2. Dysphagia: no signs of aspiration present; will not make changes in her current plan of care and will monitor her status.   3. Chronic pain with osteoarthritis of both knees: she is  getting adequate pain relief: will continue voltaren gel 2 gm to both knees four times daily; will continue neurontin  900 mg three times daily; and will continue ultram  50 mg three times daily and every 6 hours as needed; and will monitor her status.   4. Psychosis; due to alcohol: she is presently without change in status; will continue zyprexa 10 mg daily; thiamine 100 mg daily; trazodone 100 mg nightly for sleep and will monitor  5. 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   6. FTT: her current weight is 108 pounds: will continue supplements per facility protocol and will continue to monitor her status.  Her pre-albumin is 7 will continue prostat 30 cc twice daily   7. Right  hip stage IV wound: will continue wound care per facility wound care physician and will monitor her status.   8. DVT left leg: will continue xarelto 20 mg daily and will monitor her status.    Will check hgb a1c and lipids      Synthia Innocent NP [facility]  Contact [phone] Monday through Friday 8am- 5pm  After hours call [phone]

## 2015-02-10 ENCOUNTER — Non-Acute Institutional Stay (SKILLED_NURSING_FACILITY): Payer: Medicaid Other | Admitting: Adult Health

## 2015-02-10 DIAGNOSIS — R1310 Dysphagia, unspecified: Secondary | ICD-10-CM

## 2015-02-10 DIAGNOSIS — G8929 Other chronic pain: Secondary | ICD-10-CM | POA: Diagnosis not present

## 2015-02-10 DIAGNOSIS — F03918 Unspecified dementia, unspecified severity, with other behavioral disturbance: Secondary | ICD-10-CM

## 2015-02-10 DIAGNOSIS — F10959 Alcohol use, unspecified with alcohol-induced psychotic disorder, unspecified: Secondary | ICD-10-CM | POA: Diagnosis not present

## 2015-02-10 DIAGNOSIS — L89214 Pressure ulcer of right hip, stage 4: Secondary | ICD-10-CM

## 2015-02-10 DIAGNOSIS — M170 Bilateral primary osteoarthritis of knee: Secondary | ICD-10-CM

## 2015-02-10 DIAGNOSIS — F0391 Unspecified dementia with behavioral disturbance: Secondary | ICD-10-CM

## 2015-02-10 DIAGNOSIS — I824Y2 Acute embolism and thrombosis of unspecified deep veins of left proximal lower extremity: Secondary | ICD-10-CM | POA: Diagnosis not present

## 2015-02-10 DIAGNOSIS — E1140 Type 2 diabetes mellitus with diabetic neuropathy, unspecified: Secondary | ICD-10-CM | POA: Diagnosis not present

## 2015-02-20 ENCOUNTER — Emergency Department (HOSPITAL_COMMUNITY)
Admission: EM | Admit: 2015-02-20 | Discharge: 2015-02-20 | Disposition: A | Discharge status: Home / Self Care | Payer: Medicaid Other | Attending: Emergency Medicine | Admitting: Emergency Medicine

## 2015-02-20 ENCOUNTER — Encounter (HOSPITAL_COMMUNITY): Payer: Self-pay | Admitting: Emergency Medicine

## 2015-02-20 ENCOUNTER — Emergency Department (EMERGENCY_DEPARTMENT_HOSPITAL)
Admit: 2015-02-20 | Discharge: 2015-02-20 | Disposition: A | Discharge status: Home / Self Care | Payer: Medicaid Other | Attending: Emergency Medicine | Admitting: Emergency Medicine

## 2015-02-20 DIAGNOSIS — M1990 Unspecified osteoarthritis, unspecified site: Secondary | ICD-10-CM | POA: Diagnosis not present

## 2015-02-20 DIAGNOSIS — F0391 Unspecified dementia with behavioral disturbance: Secondary | ICD-10-CM | POA: Diagnosis not present

## 2015-02-20 DIAGNOSIS — Z720 Tobacco use: Secondary | ICD-10-CM | POA: Diagnosis not present

## 2015-02-20 DIAGNOSIS — Z79899 Other long term (current) drug therapy: Secondary | ICD-10-CM | POA: Insufficient documentation

## 2015-02-20 DIAGNOSIS — I824Z2 Acute embolism and thrombosis of unspecified deep veins of left distal lower extremity: Secondary | ICD-10-CM | POA: Diagnosis present

## 2015-02-20 DIAGNOSIS — G40909 Epilepsy, unspecified, not intractable, without status epilepticus: Secondary | ICD-10-CM | POA: Diagnosis not present

## 2015-02-20 DIAGNOSIS — E119 Type 2 diabetes mellitus without complications: Secondary | ICD-10-CM | POA: Diagnosis not present

## 2015-02-20 DIAGNOSIS — M79605 Pain in left leg: Secondary | ICD-10-CM | POA: Diagnosis not present

## 2015-02-20 DIAGNOSIS — M7989 Other specified soft tissue disorders: Secondary | ICD-10-CM

## 2015-02-20 MED ORDER — HYDROCODONE-ACETAMINOPHEN 5-325 MG PO TABS
1.0000 | ORAL_TABLET | Freq: Once | ORAL | Status: DC
  Filled 2015-02-20: qty 1

## 2015-02-20 NOTE — ED Notes (Signed)
Bed: XB28 Expected date:  Expected time:  Means of arrival:  Comments: EMS ?DVT

## 2015-02-20 NOTE — ED Provider Notes (Addendum)
CSN: [id number]     Arrival date & time 02/20/15  1034 History   First MD Initiated Contact with Patient 02/20/15 1059     Chief Complaint  Patient presents with  . DVT     (Consider location/radiation/quality/duration/timing/severity/associated sxs/prior Treatment) HPI Comments: Patient is a 59 year old female with a history of dementia, alcohol abuse, seizures, left leg DVT discovered in March on Xarelto who presents today from her facility for worsening left lower extremity swelling. On exam patient states that her leg hurts however she is not able to give any further information. 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 Last week they did not note any worsening swelling to her leg. Patient is also has a few toes removed from that foot from diabetes however denies any drainage or new infectious symptoms.  Based on medication list patient gets tramadol for pain.  Per facility reported new trauma. Patient just because of worsening swelling.  The history is provided by the nursing home and the EMS personnel. The history is limited by the absence of a caregiver.    Past Medical History  Diagnosis Date  . Seizures   . Alcohol abuse   . Diabetes mellitus without complication   . Dementia with behavioral problem   . Arthritis   . Dysphagia     and aspiration risk  . 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 No pertinent past surgical history. No family history on file. Social History  Substance Use Topics  . Smoking status: Current Every Day Smoker -- 0.50 packs/day for 15 years    Types: Cigarettes  . Smokeless tobacco: None  . Alcohol Use: 1.2 oz/week    2 Cans of beer per week     Comment: occ   OB History    No data available     Review of Systems  Unable to perform ROS     Allergies  Review of patient's allergies indicates no known allergies.  Home Medications   Prior to Admission medications    Medication Sig Start Date End Date Taking? Authorizing Provider  cholecalciferol (VITAMIN D) 1000 UNITS tablet Take 1,000 Units by mouth daily.    Historical Provider, MD  diclofenac sodium (VOLTAREN) 1 % GEL Apply 2 g topically 4 (four) times daily. To both knees 06/03/14   Sharee Holster, NP  gabapentin (NEURONTIN) 300 MG capsule Take 3 capsules (900 mg total) by mouth 3 (three) times daily. 06/30/14   Sharee Holster, NP  magnesium hydroxide (MILK OF MAGNESIA) 400 MG/5ML suspension Take 30 mLs by mouth every 8 (eight) hours as needed for mild constipation.     Historical Provider, MD  Melatonin 3 MG TABS Take 2 tablets by mouth at bedtime as needed.     Historical Provider, MD  metFORMIN (GLUCOPHAGE-XR) 500 MG 24 hr tablet Take 500 mg by mouth daily with breakfast.    Historical Provider, MD  Multiple Vitamin (MULTIVITAMIN WITH MINERALS) TABS tablet Take 1 tablet by mouth daily.    Historical Provider, MD  OLANZapine (ZYPREXA) 10 MG tablet Take 7.5 mg by mouth at bedtime.     Historical Provider, MD  rivaroxaban (XARELTO) 20 MG TABS tablet Take 20 mg by mouth daily with supper.    Historical Provider, MD  thiamine 100 MG tablet Take 100 mg by mouth daily.    Historical Provider, MD  traMADol (ULTRAM) 50 MG tablet Take one tablet by mouth three  times daily for pain; Take one tablet by mouth once daily as needed for pain. Max  Tramadol/day Patient taking differently: Take 50 mg by mouth. Take one tablet by mouth three times daily for pain; AND every 6 hours as needed 12/28/14   Tiffany L Reed, DO  traZODone (DESYREL) 100 MG tablet Take 100 mg by mouth at bedtime.    Historical Provider, MD   BP 115/61 mmHg  Pulse 109  Temp(Src) 98.5 F (36.9 C) (Oral)  Resp 18  Ht  (1.626 m)  Wt 110 lb (49.896 kg)  BMI 18.87 kg/m2  SpO2 96% Physical Exam  Constitutional: She is oriented to person, place, and time. She appears well-developed and well-nourished. No distress.  HENT:  Head:  Normocephalic and atraumatic.  Mouth/Throat: Oropharynx is clear and moist.  Eyes: Conjunctivae and EOM are normal. Pupils are equal, round, and reactive to light.  Neck: Normal range of motion. Neck supple.  Cardiovascular: Normal rate, regular rhythm and intact distal pulses.   No murmur heard. Pulmonary/Chest: Effort normal and breath sounds normal. No respiratory distress. She has no wheezes. She has no rales.  Abdominal: Soft. She exhibits no distension. There is no tenderness. There is no rebound and no guarding.  Musculoskeletal: Normal range of motion. She exhibits tenderness. She exhibits no edema.  Left calf and medial thigh tenderness.  Mild pitting edema to the left lower extremity. 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 No erythema or warmth.  Neurological: She is alert and oriented to person, place, and time.  Skin: Skin is warm and dry. No rash noted. No erythema.  Psychiatric: She has a normal mood and affect. Her behavior is normal.  Nursing note and vitals reviewed.   ED Course  Procedures (including critical care time) Labs Review Labs Reviewed - No data to display  Imaging Review No results found. I have personally reviewed and evaluated these images and lab results as part of my medical decision-making.   EKG Interpretation None      MDM   Final diagnoses:  Pain of left lower extremity    Patient is a 35 are old female who lives in a facility with a history of dementia presenting today for worsening left leg swelling. She has a history of DVT in that leg and is currently on Xarelto. Patient was seen by the facility physician last week and at that time symptoms appeared to be stable. They did not remark about any worsening leg swelling or pain.  She was sent today due to worsening swelling. This could be a result of known DVT however will ensure no worsening clot formation. No evidence of infectious symptoms and no history of  trauma.  12:27 PM Ultrasound showed no evidence of acute DVT however exam was limited due to spasm. Patient has no overt signs of fluid overload and no history of CHF or cardiac disease. Swelling is most likely related to having a clot in the past. Recommend using a compression hose patient can continue tramadol for pain and she was discharged back home.  Gwyneth Sprout, MD 02/20/15 1228  Gwyneth Sprout, MD 02/20/15 1254

## 2015-02-20 NOTE — ED Notes (Signed)
Pt comes to Ed with left leg DVT via EMS [facility].Pt has a hx of dementia, diabetes type II.

## 2015-02-20 NOTE — Discharge Instructions (Signed)
Need to wear compression hose

## 2015-02-20 NOTE — ED Notes (Signed)
Pt is from [facility] location , [employer]  [phone]

## 2015-02-20 NOTE — ED Notes (Signed)
Vascular US at bedside.

## 2015-02-20 NOTE — Progress Notes (Addendum)
*  PRELIMINARY RESULTS* Vascular Ultrasound Left lower extremity venous duplex has been completed.  Preliminary findings: Technically limited due to patient being contracted and tense. Poorly visualized veins. Could not fully evaluate for thrombus. Of the visualized veins, no evidence of DVT. Enlarged left inguinal lymph node noted.    Farrel Demark, RDMS, RVT  02/20/2015, 12:07 PM

## 2015-02-22 ENCOUNTER — Encounter: Payer: Self-pay | Admitting: Internal Medicine

## 2015-02-22 ENCOUNTER — Non-Acute Institutional Stay (SKILLED_NURSING_FACILITY): Payer: Medicaid Other | Admitting: Internal Medicine

## 2015-02-22 DIAGNOSIS — M170 Bilateral primary osteoarthritis of knee: Secondary | ICD-10-CM

## 2015-02-22 DIAGNOSIS — E1140 Type 2 diabetes mellitus with diabetic neuropathy, unspecified: Secondary | ICD-10-CM | POA: Diagnosis not present

## 2015-02-22 DIAGNOSIS — R635 Abnormal weight gain: Secondary | ICD-10-CM | POA: Diagnosis not present

## 2015-02-22 DIAGNOSIS — F03918 Unspecified dementia, unspecified severity, with other behavioral disturbance: Secondary | ICD-10-CM

## 2015-02-22 DIAGNOSIS — F0391 Unspecified dementia with behavioral disturbance: Secondary | ICD-10-CM | POA: Diagnosis not present

## 2015-02-22 DIAGNOSIS — I82412 Acute embolism and thrombosis of left femoral vein: Secondary | ICD-10-CM | POA: Insufficient documentation

## 2015-02-22 DIAGNOSIS — I82402 Acute embolism and thrombosis of unspecified deep veins of left lower extremity: Secondary | ICD-10-CM | POA: Diagnosis not present

## 2015-02-22 DIAGNOSIS — R600 Localized edema: Secondary | ICD-10-CM | POA: Diagnosis not present

## 2015-02-22 HISTORY — DX: Acute embolism and thrombosis of left femoral vein: I82.412

## 2015-02-22 NOTE — Progress Notes (Signed)
Patient ID: Sarah Mckay, female   DOB: 1956-04-14, 59 y.o.   MRN: [id number]    DATE: 02/22/15  Location:  [facility]    Place of Service: SNF (31)   Extended Emergency Contact Information Primary Emergency Contact: Fullen,Marcus Address: [address]          [place], [place] [address] [place] Home Phone: [phone] Relation: Brother Secondary Emergency Contact: Dewaine Oats [place] Home Phone: [phone] Relation: Niece   Chief Complaint  Patient presents with  . Acute Visit    LLE edema    HPI:  59 yo female seen today for left leg swelling. She had a venous doppler US done that was (+) DVT. She was sent to the ER for further eval as she already takes xeralto for past hx DVT. ED [place] was neg for acute DVT but motion artifact noted. She continues to c/o left calf pain and swelling. No f/c. She is a poor historian due to dementia. Hx obtained from chart.  Psychosis/dementia - stable on zyprexa and tramadol  DM with neuropathy - CBGs 100-110s. No low BS reactions. She takes metformin.  She takes neurontin and tramadol for pain  OA - uses voltaren gel BID  Past Medical History  Diagnosis Date  . Seizures   . Alcohol abuse   . Diabetes mellitus without complication   . Dementia with behavioral problem   . Arthritis   . Dysphagia     and aspiration risk  . 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  Patient Care Team: Kirt Boys, DO as PCP - General (Internal Medicine) Sharee Holster, NP as Nurse Practitioner (Nurse Practitioner)  Social History   Social History  . Marital Status: Unknown    Spouse Name: N/A  . Number of Children: N/A  . Years of Education: N/A   Occupational History  . Not on file.   Social History Main Topics  . Smoking status: Current Every Day Smoker -- 0.50 packs/day for 15 years    Types: Cigarettes  . Smokeless tobacco: Not on file  .  Alcohol Use: 1.2 oz/week    2 Cans of beer per week     Comment: occ  . Drug Use: No  . Sexual Activity: Not Currently   Other Topics Concern  . Not on file   Social History Narrative   ** Merged History Encounter **         reports that she has been smoking Cigarettes.  She has a 7.5 pack-year smoking history. She does not have any smokeless tobacco history on file. She reports that she drinks about 1.2 oz of alcohol per week. She reports that she does not use illicit drugs.  Immunization History  Administered Date(s) Administered  . Tdap 01/18/2014    No Known Allergies  Medications: Patient's Medications  New Prescriptions   No medications on file  Previous Medications   AMINO ACIDS-PROTEIN HYDROLYS (FEEDING SUPPLEMENT, PRO-STAT SUGAR FREE 64,) LIQD    Take 30 mLs by mouth 2 (two) times daily. 0800 and 1700   CHOLECALCIFEROL (VITAMIN D) 1000 UNITS TABLET    Take 1,000 Units by mouth daily.   DICLOFENAC SODIUM (VOLTAREN) 1 % GEL    Apply 2 g topically 4 (four) times daily. To both knees   GABAPENTIN (NEURONTIN) 300 MG CAPSULE    Take 3 capsules (900 mg total) by mouth 3 (three) times daily.  GABAPENTIN (NEURONTIN) 600 MG TABLET    Take 600 mg by mouth 3 (three) times daily.   MELATONIN 3 MG TABS    Take 6 mg by mouth at bedtime as needed (sleep).   METFORMIN (GLUCOPHAGE-XR) 500 MG 24 HR TABLET    Take 500 mg by mouth daily with breakfast.   MULTIPLE VITAMIN (MULTIVITAMIN WITH MINERALS) TABS TABLET    Take 1 tablet by mouth daily.   OLANZAPINE (ZYPREXA) 7.5 MG TABLET    Take 7.5 mg by mouth at bedtime.   RIVAROXABAN (XARELTO) 20 MG TABS TABLET    Take 20 mg by mouth every morning.    THIAMINE 100 MG TABLET    Take 100 mg by mouth daily.   TRAMADOL (ULTRAM) 50 MG TABLET    Take one tablet by mouth three times daily for pain; Take one tablet by mouth once daily as needed for pain. Max  Tramadol/day   TRAZODONE (DESYREL) 100 MG TABLET    Take 100 mg by mouth at bedtime as  needed for sleep.   Modified Medications   No medications on file  Discontinued Medications   No medications on file    Review of Systems  Unable to perform ROS: Dementia    Filed Vitals:   02/22/15 1622  BP: 123/67  Pulse: 92  Temp: 97.2 F (36.2 C)  Weight: 127 lb (57.607 kg) - up 6 lbs since 9/7th  SpO2: 97%   Body mass index is 21.79 kg/(m^2).  Physical Exam  Constitutional: No distress.  Frail appearing in NAD. Lying in bed on left side asleep but easily aroused  Cardiovascular:  Pulses:      Popliteal pulses are 2+ on the right side, and 2+ on the left side.       Dorsalis pedis pulses are 2+ on the right side, and 2+ on the left side.       Posterior tibial pulses are 2+ on the right side, and 2+ on the left side.  No calf TTP. No palpable cord. No palpable varicose veins  Musculoskeletal: She exhibits edema and tenderness.  No RLE edema +1 pitting LLE edema up to knee; pain with ROM; no palpable popliteal mass; reduced ROM left knee; no obvious knee effusion; no pes anserine TP; FROM at ankle and toes  Neurological: She is alert.  Skin: Skin is warm and dry. No rash noted.  Psychiatric: She has a normal mood and affect. Her behavior is normal.     Labs reviewed: No results found for any previous visit.  No results found.   Assessment/Plan   ICD-9-CM ICD-10-CM   1. Leg edema, left - new  782.3 R60.0   2. Primary osteoarthritis of both knees - stable pain 715.16 M17.0   3. Dementia with behavioral problem - stable 294.21 F03.91   4. Type 2 diabetes mellitus with diabetic neuropathy - stable 250.60 E11.40    357.2    5.      Left leg DVT (dx in May 2016) on xeralto 6.      Weight gain - possibly med induced (zyprexa)  --cont tramadol prn pain  --keep left leg elevated for swelling  --apply TED stockings in AM and remove in PM b/l LE  --cont other meds as ordered  --CBGs as ordered  --may need left knee xray +/- uric acid level to r/o gout if no  better  --will follow  [facility] [address] [place], [place] [address] [phone] Cell (Monday-Friday 8 AM - 5 PM) [phone] After 5 PM and follow prompts

## 2015-03-17 ENCOUNTER — Encounter: Payer: Self-pay | Admitting: Adult Health

## 2015-03-17 NOTE — Progress Notes (Signed)
Patient ID: Crisoforo OxfordFrieda Burzynski, female   DOB: 03/02/1956, 59 y.o.   MRN: [id number]    [facility]      No Known Allergies  Chief Complaint  Patient presents with  . Annual Exam    HPI:  She is long term resident of this facility being seen for her annual exam. Overall she is presently stable. Her right hip is resolving. She has not been hospitalized in the past year. She is unable to fully participate in the hpi or ros; but states that she is doing well. There are no nursing concerns at this time.    Past Medical History  Diagnosis Date  . Seizures (HCC)   . Alcohol abuse   . Diabetes mellitus without complication (HCC)   . Dementia with behavioral problem   . Arthritis   . Dysphagia     and aspiration risk  . 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   Social History   Social History  . Marital Status: Unknown    Spouse Name: N/A  . Number of Children: N/A  . Years of Education: N/A   Occupational History  . Not on file.   Social History Main Topics  . Smoking status: Current Every Day Smoker -- 0.50 packs/day for 15 years    Types: Cigarettes  . Smokeless tobacco: Not on file  . Alcohol Use: 1.2 oz/week    2 Cans of beer per week     Comment: occ  . Drug Use: No  . Sexual Activity: Not Currently   Other Topics Concern  . Not on file   Social History Narrative   ** Merged History Encounter **         VITAL SIGNS BP 114/68 mmHg  Pulse 90  Ht 5\' 6"  (1.676 m)  Wt 121 lb (54.885 kg)  BMI 19.54 kg/m2  SpO2 97%  Patient's Medications  New Prescriptions   No medications on file  Previous Medications   AMINO ACIDS-PROTEIN HYDROLYS (FEEDING SUPPLEMENT, PRO-STAT SUGAR FREE 64,) LIQD    Take 30 mLs by mouth 2 (two) times daily. 0800 and 1700   CHOLECALCIFEROL (VITAMIN D) 1000 UNITS TABLET    Take 1,000 Units by mouth daily.   DICLOFENAC SODIUM (VOLTAREN) 1 % GEL    Apply 2 g topically 4 (four) times  daily. To both knees   GABAPENTIN (NEURONTIN) 300 MG CAPSULE    Take 3 capsules (900 mg total) by mouth 3 (three) times daily.   GABAPENTIN (NEURONTIN) 600 MG TABLET    Take 600 mg by mouth 3 (three) times daily.   MELATONIN 3 MG TABS    Take 6 mg by mouth at bedtime as needed (sleep).   METFORMIN (GLUCOPHAGE-XR) 500 MG 24 HR TABLET    Take 500 mg by mouth daily with breakfast.   MULTIPLE VITAMIN (MULTIVITAMIN WITH MINERALS) TABS TABLET    Take 1 tablet by mouth daily.   OLANZAPINE (ZYPREXA) 7.5 MG TABLET    Take 7.5 mg by mouth at bedtime.   RIVAROXABAN (XARELTO) 20 MG TABS TABLET    Take 20 mg by mouth every morning.    THIAMINE 100 MG TABLET    Take 100 mg by mouth daily.   TRAMADOL (ULTRAM) 50 MG TABLET    Take one tablet by mouth three times daily for pain; Take one tablet by mouth once daily as needed for pain. Max 400mg  Tramadol/day   TRAZODONE (DESYREL)  100 MG TABLET    Take 100 mg by mouth at bedtime as needed for sleep.   Modified Medications   No medications on file  Discontinued Medications   No medications on file     SIGNIFICANT DIAGNOSTIC EXAMS  Not a candidate for dexa; mammogram or colonoscopy    10-02-14: left leg doppler: +dvt  10-08-14: bilateral hip /pelvic fracture: right hip with marked osteoarthritis   LABS REVIEWED:   01-29-14: glucose 119; bun 13.1; creat 0.51; k+4.0; na++142; tsh 0.93; vit b12: 528; hgb a1c 6.4 04-10-14: total testosterone: 492;  Free testosterone: 60.5  06-15-14: wbc 12.6; hgb 12.0; hct 39.0; mcv 91; plt 278; glucose 107; bun 19.1; creat 0.47; k+4.1; na++143; liver normal albumin 4.4 06-17-14: urine for micro-albumin 7.4  08-12-14: guaiac stool: neg 09-15-14: pre-albumin 17 09-16-14: wbc 9.7; hgb 11.0; hct 34.4; mcv 88.2; plt 319; glucose 106; bun 15; creat 0.6; k+4.3; na++138; liver normal albumin 3.8  11-06-14: pre-albumin 7  12-11-14; hgb a1c 7.3      Review of Systems Unable to perform ROS: dementia    Physical Exam Constitutional:  No distress.  Frail   Neck: Neck supple. No JVD present. No thyromegaly present.  Cardiovascular: Normal rate, regular rhythm and intact distal pulses.   Respiratory: Effort normal and breath sounds normal. No respiratory distress.  GI: Soft. Bowel sounds are normal. She exhibits no distension. There is no tenderness.  Musculoskeletal: She exhibits no edema.  Is able to move all extremities   Neurological: She is alert.  Skin: Skin is warm and dry. She is not diaphoretic. Right hip ulcer: 1.2 x 0.8 x 0.12 cm 80% granulation; 20% yellow slough  No longer has wound vac       ASSESSMENT/ PLAN:  1. Diabetes: she is presently stable her hgb a1c 7.3; will continue metformin 500 mg daily;   and will monitor  Urine for micro-albumin is 7.4; is on lisinopril 2.5 mg daily   2. Dysphagia: no signs of aspiration present; will not make changes in her current plan of care and will monitor her status.   3. Chronic pain with osteoarthritis of both knees: she is  getting adequate pain relief: will continue voltaren gel 2 gm to both knees four times daily; will continue neurontin  900 mg three times daily; and will continue ultram  50 mg three times daily and every 6 hours as needed; and will monitor her status.   4. Psychosis; due to alcohol: she is presently without change in status; will continue zyprexa 10 mg daily; thiamine 100 mg daily; trazodone 100 mg nightly for sleep and will monitor  5. 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   6. FTT: her current weight is 121 pounds: will continue supplements per facility protocol and will continue to monitor her status.  Her pre-albumin is 7 will continue prostat 30 cc twice daily   7. Right  hip stage IV wound: will continue wound care per facility wound care physician and will monitor her status.   8. DVT left leg: will  continue xarelto 20 mg daily and will monitor her status. She has been on this since 10-02-14.      Synthia Innocent NP [facility]  Contact [phone] Monday through Friday 8am- 5pm  After hours call [phone]

## 2015-03-25 ENCOUNTER — Encounter: Payer: Self-pay | Admitting: Internal Medicine

## 2015-03-25 ENCOUNTER — Non-Acute Institutional Stay (SKILLED_NURSING_FACILITY): Payer: Medicaid Other | Admitting: Internal Medicine

## 2015-03-25 DIAGNOSIS — F10959 Alcohol use, unspecified with alcohol-induced psychotic disorder, unspecified: Secondary | ICD-10-CM

## 2015-03-25 DIAGNOSIS — F0391 Unspecified dementia with behavioral disturbance: Secondary | ICD-10-CM | POA: Diagnosis not present

## 2015-03-25 DIAGNOSIS — I82412 Acute embolism and thrombosis of left femoral vein: Secondary | ICD-10-CM

## 2015-03-25 DIAGNOSIS — E1142 Type 2 diabetes mellitus with diabetic polyneuropathy: Secondary | ICD-10-CM | POA: Diagnosis not present

## 2015-03-25 DIAGNOSIS — G4700 Insomnia, unspecified: Secondary | ICD-10-CM | POA: Diagnosis not present

## 2015-03-25 DIAGNOSIS — M170 Bilateral primary osteoarthritis of knee: Secondary | ICD-10-CM | POA: Diagnosis not present

## 2015-03-25 DIAGNOSIS — L89214 Pressure ulcer of right hip, stage 4: Secondary | ICD-10-CM | POA: Diagnosis not present

## 2015-03-25 DIAGNOSIS — F03918 Unspecified dementia, unspecified severity, with other behavioral disturbance: Secondary | ICD-10-CM

## 2015-03-25 NOTE — Assessment & Plan Note (Addendum)
A1c 7.3 in 11/2014; cont glucophage XR daily; not on statin or ACE

## 2015-03-25 NOTE — Assessment & Plan Note (Signed)
Chronic and stable on zyprexa; failed GDR of zyprexa; cont zyprexa

## 2015-03-25 NOTE — Assessment & Plan Note (Signed)
DX in may 2016 with recent edema and repeat study, poor quality abd no clot visualized; rec for now cont xarelto

## 2015-03-25 NOTE — Progress Notes (Signed)
MRN: [id number] Name: Sarah Mckay  Sex: female Age: 59 y.o. DOB: 1956/04/05  PSC #: Ronni Rumble  Facility/Room:227 Level Of Care: SNF Provider: Merrilee Seashore D Emergency Contacts: Extended Emergency Contact Information Primary Emergency Contact: Fullwood,Marcus Address: [address]          [place], [place] [address] [place] Home Phone: [phone] Relation: Brother Secondary Emergency Contact: Dewaine Oats [place] Home Phone: [phone] Relation: Niece  Code Status: FULL  Allergies: Review of patient's allergies indicates no known allergies.  Chief Complaint  Patient presents with  . Medical Management of Chronic Issues    HPI: Patient is 59 y.o. female with dementia, alcohol abuse with psychosis, seizures, left leg DVT discovered in March on Xarelto being seen today for routine issues.  Past Medical History  Diagnosis Date  . Seizures (HCC)   . Alcohol abuse   . Diabetes mellitus without complication (HCC)   . Dementia with behavioral problem   . Arthritis   . Dysphagia     and aspiration risk  . 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 No pertinent past surgical history.    Medication List       This list is accurate as of: 03/25/15  3:00 PM.  Always use your most recent med list.               cholecalciferol 1000 UNITS tablet  Commonly known as:  VITAMIN D  Take 1,000 Units by mouth daily.     diclofenac sodium 1 % Gel  Commonly known as:  VOLTAREN  Apply 2 g topically 4 (four) times daily. To both knees     feeding supplement (PRO-STAT SUGAR FREE 64) Liqd  Take 30 mLs by mouth 2 (two) times daily. 0800 and 1700     gabapentin 600 MG tablet  Commonly known as:  NEURONTIN  Take 600 mg by mouth 3 (three) times daily.     gabapentin 300 MG capsule  Commonly known as:  NEURONTIN  Take 3 capsules (900 mg total) by mouth 3 (three) times daily.     Melatonin 3 MG Tabs  Take 6 mg by mouth  at bedtime as needed (sleep).     metFORMIN 500 MG 24 hr tablet  Commonly known as:  GLUCOPHAGE-XR  Take 500 mg by mouth daily with breakfast.     multivitamin with minerals Tabs tablet  Take 1 tablet by mouth daily.     OLANZapine 7.5 MG tablet  Commonly known as:  ZYPREXA  Take 7.5 mg by mouth at bedtime.     rivaroxaban 20 MG Tabs tablet  Commonly known as:  XARELTO  Take 20 mg by mouth every morning.     thiamine 100 MG tablet  Take 100 mg by mouth daily.     traMADol 50 MG tablet  Commonly known as:  ULTRAM  Take one tablet by mouth three times daily for pain; Take one tablet by mouth once daily as needed for pain. Max  Tramadol/day     traZODone 100 MG tablet  Commonly known as:  DESYREL  Take 100 mg by mouth at bedtime as needed for sleep.        No orders of the defined types were placed in this encounter.    Immunization History  Administered Date(s) Administered  . Tdap 01/18/2014    Social History  Substance Use Topics  . Smoking status: Current Every Day Smoker -- 0.50 packs/day for  15 years    Types: Cigarettes  . Smokeless tobacco: Not on file  . Alcohol Use: 1.2 oz/week    2 Cans of beer per week     Comment: occ    Review of Systems  DATA OBTAINED: from patient, nurse- no concerns GENERAL:  no fevers, fatigue, appetite changes SKIN: No itching, rash HEENT: No complaint RESPIRATORY: No cough, wheezing, SOB CARDIAC: No chest pain, palpitations, lower extremity edema better GI: No abdominal pain, No N/V/D or constipation, No heartburn or reflux  GU: No dysuria, frequency or urgency, or incontinence  MUSCULOSKELETAL: No unrelieved bone/joint pain NEUROLOGIC: No headache, dizziness  PSYCHIATRIC: No overt anxiety or sadness  Filed Vitals:   03/25/15 1439  BP: 121/75  Pulse: 77  Temp: 97.7 F (36.5 C)  Resp: 20    Physical Exam  GENERAL APPEARANCE: Alert, conversant,BF, No acute distress  SKIN: No diaphoresis rash; sacral wound  not visualized - is dressed HEENT: Unremarkable RESPIRATORY: Breathing is even, unlabored. Lung sounds are clear   CARDIOVASCULAR: Heart RRR no murmurs, rubs or gallops. 1+ LLE  peripheral edema  GASTROINTESTINAL: Abdomen is soft, non-tender, not distended w/ normal bowel sounds.  GENITOURINARY: Bladder non tender, not distended  MUSCULOSKELETAL: No abnormal joints or musculature NEUROLOGIC: Cranial nerves 2-12 grossly intact. Moves all extremities PSYCHIATRIC: Mood and affect appropriate to situation with dementia, no behavioral issues  Patient Active Problem List   Diagnosis Date Noted  . Insomnia 03/25/2015  . Polyneuropathy due to type 2 diabetes mellitus (HCC) 03/25/2015  . Acute deep vein thrombosis (DVT) of left femoral vein (HCC) 02/22/2015  . Diabetes mellitus with neurological manifestations (HCC) 10/22/2014  . 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 Primary osteoarthritis of both knees 06/03/2014  . Chronic pain 05/13/2014  . Alcohol abuse   . Dementia with behavioral problem   . Dysphagia   . 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

## 2015-03-25 NOTE — Assessment & Plan Note (Signed)
Chronic and stable; cont voltarin gel

## 2015-03-25 NOTE — Assessment & Plan Note (Signed)
Slowly improving; cont wound care

## 2015-03-25 NOTE — Assessment & Plan Note (Signed)
Chronic and stable on trazodone;cont trazodone

## 2015-03-25 NOTE — Assessment & Plan Note (Signed)
Chronic and failry stable;weight has improved; cont monitor

## 2015-03-25 NOTE — Assessment & Plan Note (Signed)
No c/o today;stable on neurontin; cont neurontin which helps with mood disorder as well

## 2015-03-27 ENCOUNTER — Emergency Department (HOSPITAL_COMMUNITY): Payer: Medicaid Other

## 2015-03-27 ENCOUNTER — Encounter (HOSPITAL_COMMUNITY): Payer: Self-pay | Admitting: Emergency Medicine

## 2015-03-27 ENCOUNTER — Emergency Department (HOSPITAL_COMMUNITY)
Admission: EM | Admit: 2015-03-27 | Discharge: 2015-03-27 | Disposition: A | Discharge status: Home / Self Care | Payer: Medicaid Other | Attending: Emergency Medicine | Admitting: Emergency Medicine

## 2015-03-27 DIAGNOSIS — S01112A Laceration without foreign body of left eyelid and periocular area, initial encounter: Secondary | ICD-10-CM | POA: Diagnosis not present

## 2015-03-27 DIAGNOSIS — Y92128 Other place in nursing home as the place of occurrence of the external cause: Secondary | ICD-10-CM | POA: Insufficient documentation

## 2015-03-27 DIAGNOSIS — W050XXA Fall from non-moving wheelchair, initial encounter: Secondary | ICD-10-CM | POA: Diagnosis not present

## 2015-03-27 DIAGNOSIS — S0990XA Unspecified injury of head, initial encounter: Secondary | ICD-10-CM | POA: Diagnosis present

## 2015-03-27 DIAGNOSIS — G40909 Epilepsy, unspecified, not intractable, without status epilepticus: Secondary | ICD-10-CM | POA: Diagnosis not present

## 2015-03-27 DIAGNOSIS — Y9389 Activity, other specified: Secondary | ICD-10-CM | POA: Diagnosis not present

## 2015-03-27 DIAGNOSIS — E119 Type 2 diabetes mellitus without complications: Secondary | ICD-10-CM | POA: Diagnosis not present

## 2015-03-27 DIAGNOSIS — W19XXXA Unspecified fall, initial encounter: Secondary | ICD-10-CM

## 2015-03-27 DIAGNOSIS — Z79899 Other long term (current) drug therapy: Secondary | ICD-10-CM | POA: Diagnosis not present

## 2015-03-27 DIAGNOSIS — Z720 Tobacco use: Secondary | ICD-10-CM | POA: Insufficient documentation

## 2015-03-27 DIAGNOSIS — Y998 Other external cause status: Secondary | ICD-10-CM | POA: Insufficient documentation

## 2015-03-27 DIAGNOSIS — M1990 Unspecified osteoarthritis, unspecified site: Secondary | ICD-10-CM | POA: Diagnosis not present

## 2015-03-27 DIAGNOSIS — F0390 Unspecified dementia without behavioral disturbance: Secondary | ICD-10-CM | POA: Diagnosis not present

## 2015-03-27 MED ORDER — LIDOCAINE HCL 1 % IJ SOLN
INTRAMUSCULAR | Status: AC
  Filled 2015-03-27: qty 20

## 2015-03-27 NOTE — ED Notes (Signed)
Bed: [id number] Expected date:  Expected time:  Means of arrival:  Comments: EMS- Fall w/ lac

## 2015-03-27 NOTE — ED Provider Notes (Signed)
CSN: [id number]     Arrival date & time 03/27/15  1116 History   First MD Initiated Contact with Patient 03/27/15 1124     Chief Complaint  Patient presents with  . Fall  . Head Injury     (Consider location/radiation/quality/duration/timing/severity/associated sxs/prior Treatment) HPI Comments: Patient fell while moving into a wheelchair. She did not fall from standing. She is at her baseline per the nursing home. She did not lose consciousness.  Patient is a 59 y.o. female presenting with fall and head injury. The history is provided by the patient.  Fall This is a new problem. The current episode started less than 1 hour ago. The problem occurs constantly. The problem has not changed since onset.Pertinent negatives include no shortness of breath. Nothing aggravates the symptoms.  Head Injury Associated symptoms: no vomiting     Past Medical History  Diagnosis Date  . Seizures (HCC)   . Alcohol abuse   . Diabetes mellitus without complication (HCC)   . Dementia with behavioral problem   . Arthritis   . Dysphagia     and aspiration risk  . 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 No family history on file. Social History  Substance Use Topics  . Smoking status: Current Every Day Smoker -- 0.50 packs/day for 15 years    Types: Cigarettes  . Smokeless tobacco: None  . Alcohol Use: 1.2 oz/week    2 Cans of beer per week     Comment: occ   OB History    No data available     Review of Systems  Constitutional: Negative for fever.  Respiratory: Negative for cough and shortness of breath.   Gastrointestinal: Negative for vomiting.  All other systems reviewed and are negative.     Allergies  Review of patient's allergies indicates no known allergies.  Home Medications   Prior to Admission medications   Medication Sig Start Date End Date Taking? Authorizing Provider  Amino Acids-Protein Hydrolys (FEEDING SUPPLEMENT, PRO-STAT SUGAR FREE  64,) LIQD Take 30 mLs by mouth 2 (two) times daily. 0800 and 1700    Historical Provider, MD  cholecalciferol (VITAMIN D) 1000 UNITS tablet Take 1,000 Units by mouth daily.    Historical Provider, MD  diclofenac sodium (VOLTAREN) 1 % GEL Apply 2 g topically 4 (four) times daily. To both knees 06/03/14   Sharee Holstereborah S Green, NP  gabapentin (NEURONTIN) 300 MG capsule Take 3 capsules (900 mg total) by mouth 3 (three) times daily. Patient taking differently: Take 300 mg by mouth 3 (three) times daily.  06/30/14   Sharee Holstereborah S Green, NP  gabapentin (NEURONTIN) 600 MG tablet Take 600 mg by mouth 3 (three) times daily.    Historical Provider, MD  Melatonin 3 MG TABS Take 6 mg by mouth at bedtime as needed (sleep).    Historical Provider, MD  metFORMIN (GLUCOPHAGE-XR) 500 MG 24 hr tablet Take 500 mg by mouth daily with breakfast.    Historical Provider, MD  Multiple Vitamin (MULTIVITAMIN WITH MINERALS) TABS tablet Take 1 tablet by mouth daily.    Historical Provider, MD  OLANZapine (ZYPREXA) 7.5 MG tablet Take 7.5 mg by mouth at bedtime.    Historical Provider, MD  rivaroxaban (XARELTO) 20 MG TABS tablet Take 20 mg by mouth every morning.     Historical Provider, MD  thiamine 100 MG tablet Take 100 mg by mouth daily.    Historical Provider, MD  traMADol (ULTRAM) 50 MG  tablet Take one tablet by mouth three times daily for pain; Take one tablet by mouth once daily as needed for pain. Max  Tramadol/day Patient taking differently: Take 50 mg by mouth 3 (three) times daily. Every 6 hours PRN for pain as well 12/28/14   Tiffany L Reed, DO  traZODone (DESYREL) 100 MG tablet Take 100 mg by mouth at bedtime as needed for sleep.     Historical Provider, MD   BP 114/79 mmHg  Pulse 91  Temp(Src) 98.4 F (36.9 C) (Oral)  Resp 18  SpO2 97% Physical Exam  Constitutional: She is oriented to person, place, and time. She appears well-developed and well-nourished. No distress.  HENT:  Head: Normocephalic.    Mouth/Throat:  Oropharynx is clear and moist.  Eyes: EOM are normal. Pupils are equal, round, and reactive to light.  Neck: Normal range of motion. Neck supple.  Cardiovascular: Normal rate and regular rhythm.  Exam reveals no friction rub.   No murmur heard. Pulmonary/Chest: Effort normal and breath sounds normal. No respiratory distress. She has no wheezes. She has no rales.  Abdominal: Soft. She exhibits no distension. There is no tenderness. There is no rebound.  Musculoskeletal: Normal range of motion. She exhibits no edema.  Neurological: She is alert and oriented to person, place, and time.  Skin: No rash noted. She is not diaphoretic.  Nursing note and vitals reviewed.   ED Course  .Marland KitchenLaceration Repair Date/Time: 03/27/2015 2:27 PM Performed by: Elwin Mocha Authorized by: Elwin Mocha Consent: Verbal consent obtained. Body area: head/neck Location details: left eyebrow Laceration length: 2 cm Foreign bodies: no foreign bodies Tendon involvement: none Nerve involvement: none Vascular damage: no Anesthesia: local infiltration Local anesthetic: lidocaine 1% without epinephrine Anesthetic total: 2 ml Preparation: Patient was prepped and draped in the usual sterile fashion. Irrigation solution: saline Irrigation method: jet lavage Amount of cleaning: standard Debridement: none Degree of undermining: none Skin closure: 5-0 Prolene Number of sutures: 2 Technique: simple Approximation: close Approximation difficulty: simple Patient tolerance: Patient tolerated the procedure well with no immediate complications   (including critical care time) Labs Review Labs Reviewed - No data to display  Imaging Review Ct Head Wo Contrast  03/27/2015  CLINICAL DATA:  Left frontal hematoma and laceration status post fall. EXAM: CT HEAD WITHOUT CONTRAST TECHNIQUE: Contiguous axial images were obtained from the base of the skull through the vertex without intravenous contrast. COMPARISON:   08/04/2014 FINDINGS: No mass effect or midline shift. No evidence of acute intracranial hemorrhage, or infarction. No abnormal extra-axial fluid collections. Gray-white matter differentiation is normal. Basal cisterns are preserved. There is mild brain parenchymal atrophy and chronic small vessel disease changes. No depressed skull fractures. Visualized paranasal sinuses and mastoid air cells are not opacified. IMPRESSION: No acute intracranial abnormality. Mild brain parenchymal atrophy, chronic microvascular disease. Electronically Signed   By: Ted Mcalpine M.D.   On: 03/27/2015 12:40   I have personally reviewed and evaluated these images and lab results as part of my medical decision-making.   EKG Interpretation None      MDM   Final diagnoses:  Fall, initial encounter  Eyebrow laceration, left, initial encounter    59 year old female here after falling while getting into her wheelchair. She doesn't like how long they leave her bed so she gets out of bed across her wheelchair at her nursing home. He fell forward and hit her left eyebrow. She is on Cymbalta. She has a small superficial laceration to her left lateral  eyebrow. We'll scan her head  Scan ok. Lac repair as above. Stable for discharge.  Elwin Mocha, MD 03/27/15 [phone]

## 2015-03-27 NOTE — ED Notes (Signed)
Per EMS[facility]  Alert & oriented x 3 (all except time).  Pt states that she is unhappy with how long it takes staff to help her into her wheelchair so she gets in the floor and crawls to her wheelchair.  When she was trying to get up in the wheelchair, she fell forward and has hematoma/lac to lt forehead.  C/o lt leg pain and has a bedsore to rt buttock.  On xarelto.

## 2015-03-27 NOTE — Discharge Instructions (Signed)
Facial Laceration ° A facial laceration is a cut on the face. These injuries can be painful and cause bleeding. Lacerations usually heal quickly, but they need special care to reduce scarring. °DIAGNOSIS  °Your health care provider will take a medical history, ask for details about how the injury occurred, and examine the wound to determine how deep the cut is. °TREATMENT  °Some facial lacerations may not require closure. Others may not be able to be closed because of an increased risk of infection. The risk of infection and the chance for successful closure will depend on various factors, including the amount of time since the injury occurred. °The wound may be cleaned to help prevent infection. If closure is appropriate, pain medicines may be given if needed. Your health care provider will use stitches (sutures), wound glue (adhesive), or skin adhesive strips to repair the laceration. These tools bring the skin edges together to allow for faster healing and a better cosmetic outcome. If needed, you may also be given a tetanus shot. °HOME CARE INSTRUCTIONS °· Only take over-the-counter or prescription medicines as directed by your health care provider. °· Follow your health care provider's instructions for wound care. These instructions will vary depending on the technique used for closing the wound. °For Sutures: °· Keep the wound clean and dry.   °· If you were given a bandage (dressing), you should change it at least once a day. Also change the dressing if it becomes wet or dirty, or as directed by your health care provider.   °· Wash the wound with soap and water 2 times a day. Rinse the wound off with water to remove all soap. Pat the wound dry with a clean towel.   °· After cleaning, apply a thin layer of the antibiotic ointment recommended by your health care provider. This will help prevent infection and keep the dressing from sticking.   °· You may shower as usual after the first 24 hours. Do not soak the  wound in water until the sutures are removed.   °· Get your sutures removed as directed by your health care provider. With facial lacerations, sutures should usually be taken out after 4-5 days to avoid stitch marks.   °· Wait a few days after your sutures are removed before applying any makeup. °For Skin Adhesive Strips: °· Keep the wound clean and dry.   °· Do not get the skin adhesive strips wet. You may bathe carefully, using caution to keep the wound dry.   °· If the wound gets wet, pat it dry with a clean towel.   °· Skin adhesive strips will fall off on their own. You may trim the strips as the wound heals. Do not remove skin adhesive strips that are still stuck to the wound. They will fall off in time.   °For Wound Adhesive: °· You may briefly wet your wound in the shower or bath. Do not soak or scrub the wound. Do not swim. Avoid periods of heavy sweating until the skin adhesive has fallen off on its own. After showering or bathing, gently pat the wound dry with a clean towel.   °· Do not apply liquid medicine, cream medicine, ointment medicine, or makeup to your wound while the skin adhesive is in place. This may loosen the film before your wound is healed.   °· If a dressing is placed over the wound, be careful not to apply tape directly over the skin adhesive. This may cause the adhesive to be pulled off before the wound is healed.   °· Avoid   prolonged exposure to sunlight or tanning lamps while the skin adhesive is in place. °· The skin adhesive will usually remain in place for 5-10 days, then naturally fall off the skin. Do not pick at the adhesive film.   °After Healing: °Once the wound has healed, cover the wound with sunscreen during the day for 1 full year. This can help minimize scarring. Exposure to ultraviolet light in the first year will darken the scar. It can take 1-2 years for the scar to lose its redness and to heal completely.  °SEEK MEDICAL CARE IF: °· You have a fever. °SEEK IMMEDIATE  MEDICAL CARE IF: °· You have redness, pain, or swelling around the wound.   °· You see a yellowish-white fluid (pus) coming from the wound.   °  °This information is not intended to replace advice given to you by your health care provider. Make sure you discuss any questions you have with your health care provider. °  °Document Released: 06/22/2004 Document Revised: 06/05/2014 Document Reviewed: 12/26/2012 °Elsevier Interactive Patient Education ©2016 [employer] ° °

## 2015-04-19 ENCOUNTER — Non-Acute Institutional Stay (SKILLED_NURSING_FACILITY): Payer: Medicaid Other | Admitting: Internal Medicine

## 2015-04-19 ENCOUNTER — Encounter: Payer: Self-pay | Admitting: Internal Medicine

## 2015-04-19 DIAGNOSIS — E1142 Type 2 diabetes mellitus with diabetic polyneuropathy: Secondary | ICD-10-CM

## 2015-04-19 DIAGNOSIS — R1310 Dysphagia, unspecified: Secondary | ICD-10-CM

## 2015-04-19 DIAGNOSIS — F10959 Alcohol use, unspecified with alcohol-induced psychotic disorder, unspecified: Secondary | ICD-10-CM

## 2015-04-19 NOTE — Progress Notes (Signed)
MRN: [id number] Name: Sarah Mckay  Sex: female Age: 59 y.o. DOB: 02/09/1956  [facility] Level Of Care: SNF Provider: Merrilee SeashoreALEXANDER, Issam Carlyon D Emergency Contacts: Extended Emergency Contact Information Primary Emergency Contact: Hayduk,Marcus Address: [address]          [place], [place] [address] [place] Home Phone: [phone] Relation: Brother Secondary Emergency Contact: Dewaine Oatsarpenter,Marie  [place] Home Phone: [phone] Relation: Niece  Code Status:   Allergies: Review of patient's allergies indicates no known allergies.  Chief Complaint  Patient presents with  . Medical Management of Chronic Issues    HPI: Patient is 59 y.o. female with h/o ETOH abuse, seizures, DM2, dementia, dysphagia and psychosis who is being seen for routine issues of psychosis, dysphagia and DM2.   Past Medical History  Diagnosis Date  . Seizures (HCC)   . Alcohol abuse   . Diabetes mellitus without complication (HCC)   . Dementia with behavioral problem   . Arthritis   . Dysphagia     and aspiration risk  . 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 No pertinent past surgical history.    Medication List       This list is accurate as of: 04/19/15 11:59 PM.  Always use your most recent med list.               cholecalciferol 1000 UNITS tablet  Commonly known as:  VITAMIN D  Take 1,000 Units by mouth daily.     diclofenac sodium 1 % Gel  Commonly known as:  VOLTAREN  Apply 2 g topically 4 (four) times daily. To both knees     feeding supplement (PRO-STAT SUGAR FREE 64) Liqd  Take 30 mLs by mouth 2 (two) times daily. 0800 and 1700     gabapentin 600 MG tablet  Commonly known as:  NEURONTIN  Take 600 mg by mouth 3 (three) times daily.     gabapentin 300 MG capsule  Commonly known as:  NEURONTIN  Take 3 capsules (900 mg total) by mouth 3 (three) times daily.     magnesium hydroxide 400 MG/5ML suspension   Commonly known as:  MILK OF MAGNESIA  Take 30 mLs by mouth every 8 (eight) hours as needed for mild constipation.     Melatonin 3 MG Tabs  Take 6 mg by mouth at bedtime as needed (sleep).     metFORMIN 500 MG 24 hr tablet  Commonly known as:  GLUCOPHAGE-XR  Take 500 mg by mouth daily with breakfast.     multivitamin with minerals Tabs tablet  Take 1 tablet by mouth daily.     OLANZapine 7.5 MG tablet  Commonly known as:  ZYPREXA  Take 7.5 mg by mouth at bedtime.     rivaroxaban 20 MG Tabs tablet  Commonly known as:  XARELTO  Take 20 mg by mouth every morning.     thiamine 100 MG tablet  Take 100 mg by mouth daily.     traMADol 50 MG tablet  Commonly known as:  ULTRAM  Take 50 mg by mouth every 6 (six) hours as needed (for pain).     traMADol 50 MG tablet  Commonly known as:  ULTRAM  Take one tablet by mouth three times daily for pain; Take one tablet by mouth once daily as needed for pain. Max 400mg  Tramadol/day     traZODone 100 MG tablet  Commonly known as:  DESYREL  Take 100 mg  by mouth at bedtime as needed for sleep.        No orders of the defined types were placed in this encounter.    Immunization History  Administered Date(s) Administered  . Pneumococcal-Unspecified 06/16/2014  . Tdap 01/18/2014    Social History  Substance Use Topics  . Smoking status: Current Every Day Smoker -- 0.50 packs/day for 15 years    Types: Cigarettes  . Smokeless tobacco: Not on file  . Alcohol Use: 1.2 oz/week    2 Cans of beer per week     Comment: occ    Review of Systems - UTO 2/2 dementia; per nursing - pt fell a few days ago and c/o at one point wrist pain but has since used it just fine without problems  Filed Vitals:   04/19/15 1420  BP: 153/95  Pulse: 103  Temp: 98.1 F (36.7 C)  Resp: 20    Physical Exam  GENERAL APPEARANCE: Alert, minconversant, thin, frail No acute distress  SKIN: No diaphoresis rash; hip wound dressed HEENT:  Unremarkable RESPIRATORY: Breathing is even, unlabored. Lung sounds are clear   CARDIOVASCULAR: Heart RRR no murmurs, rubs or gallops. No peripheral edema  GASTROINTESTINAL: Abdomen is soft, non-tender, not distended w/ normal bowel sounds.  GENITOURINARY: Bladder non tender, not distended  MUSCULOSKELETAL: No abnormal joints or musculature ; R wrist -pt is thin without any soft tissue so contours of bone very evident;there is no swelling, no point tenderness , no deformity and painless ROM NEUROLOGIC: Cranial nerves 2-12 grossly intact. Moves all extremities PSYCHIATRIC: dementia, no behavioral issues  Patient Active Problem List   Diagnosis Date Noted  . Insomnia 03/25/2015  . Polyneuropathy due to type 2 diabetes mellitus (HCC) 03/25/2015  . Acute deep vein thrombosis (DVT) of left femoral vein (HCC) 02/22/2015  . Diabetes mellitus with neurological manifestations (HCC) 10/22/2014  . 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 Primary osteoarthritis of both knees 06/03/2014  . Chronic pain 05/13/2014  . Alcohol abuse   . Dementia with behavioral problem   . Dysphagia   . 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    Diabetes mellitus with neurological manifestations she is presently stable her hgb a1c 7.3; will continue metformin 500 mg daily; and will monitor Urine for micro-albumin is 7.4; is on lisinopril  2.5 mg daily      Margit Hanks, MD

## 2015-04-23 ENCOUNTER — Encounter (HOSPITAL_COMMUNITY): Payer: Self-pay | Admitting: Emergency Medicine

## 2015-04-23 ENCOUNTER — Emergency Department (HOSPITAL_COMMUNITY): Payer: Medicaid Other

## 2015-04-23 ENCOUNTER — Emergency Department (HOSPITAL_COMMUNITY)
Admission: EM | Admit: 2015-04-23 | Discharge: 2015-04-23 | Disposition: A | Discharge status: Home / Self Care | Payer: Medicaid Other | Attending: Emergency Medicine | Admitting: Emergency Medicine

## 2015-04-23 DIAGNOSIS — S63501A Unspecified sprain of right wrist, initial encounter: Secondary | ICD-10-CM | POA: Insufficient documentation

## 2015-04-23 DIAGNOSIS — F0390 Unspecified dementia without behavioral disturbance: Secondary | ICD-10-CM | POA: Diagnosis not present

## 2015-04-23 DIAGNOSIS — F17210 Nicotine dependence, cigarettes, uncomplicated: Secondary | ICD-10-CM | POA: Insufficient documentation

## 2015-04-23 DIAGNOSIS — W01190A Fall on same level from slipping, tripping and stumbling with subsequent striking against furniture, initial encounter: Secondary | ICD-10-CM | POA: Diagnosis not present

## 2015-04-23 DIAGNOSIS — Z7901 Long term (current) use of anticoagulants: Secondary | ICD-10-CM | POA: Insufficient documentation

## 2015-04-23 DIAGNOSIS — Y9289 Other specified places as the place of occurrence of the external cause: Secondary | ICD-10-CM | POA: Diagnosis not present

## 2015-04-23 DIAGNOSIS — Y998 Other external cause status: Secondary | ICD-10-CM | POA: Diagnosis not present

## 2015-04-23 DIAGNOSIS — Z79899 Other long term (current) drug therapy: Secondary | ICD-10-CM | POA: Diagnosis not present

## 2015-04-23 DIAGNOSIS — Z8739 Personal history of other diseases of the musculoskeletal system and connective tissue: Secondary | ICD-10-CM | POA: Diagnosis not present

## 2015-04-23 DIAGNOSIS — E119 Type 2 diabetes mellitus without complications: Secondary | ICD-10-CM | POA: Insufficient documentation

## 2015-04-23 DIAGNOSIS — Y9389 Activity, other specified: Secondary | ICD-10-CM | POA: Insufficient documentation

## 2015-04-23 DIAGNOSIS — S6991XA Unspecified injury of right wrist, hand and finger(s), initial encounter: Secondary | ICD-10-CM | POA: Diagnosis present

## 2015-04-23 NOTE — ED Provider Notes (Signed)
CSN: [id number]     Arrival date & time 04/23/15  1537 History  By signing my name below, I, Sarah Mckay, attest that this documentation has been prepared under the direction and in the presence of non-physician practitioner, Jaynie Crumbleatyana Thornton Dohrmann, PA-C. Electronically Signed: Freida Busmaniana Mckay, Scribe. 04/23/2015. 4:47 PM.   Chief Complaint  Patient presents with  . Wrist Injury   LEVEL 5 CAVEAT DUE TO DEMENTIA  The history is provided by the patient and the EMS personnel. No language interpreter was used.   HPI Comments:  Sarah Mckay is a 59 y.o. female with a h/o dementia, who presents to the Emergency Department via EMS complaining of  pain to her right wrist s/p injury ~ 6 days ago. Pt states she fell and struck her arm on the dresser. Per EMS pt has a confirmed non-displaced right distal ulnar fracture with minimal angular deformity. Pt also has a h/o frequent falls per EMS.   Past Medical History  Diagnosis Date  . Seizures (HCC)   . Alcohol abuse   . Diabetes mellitus without complication (HCC)   . Dementia with behavioral problem   . Arthritis   . Dysphagia     and aspiration risk  . 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 No pertinent past surgical history. History reviewed. No pertinent family history. Social History  Substance Use Topics  . Smoking status: Current Every Day Smoker -- 0.50 packs/day for 15 years    Types: Cigarettes  . Smokeless tobacco: None  . Alcohol Use: 1.2 oz/week    2 Cans of beer per week     Comment: occ   OB History    No data available     Review of Systems  Unable to perform ROS: Dementia    Allergies  Review of patient's allergies indicates no known allergies.  Home Medications   Prior to Admission medications   Medication Sig Start Date End Date Taking? Authorizing Provider  Amino Acids-Protein Hydrolys (FEEDING SUPPLEMENT, PRO-STAT SUGAR FREE 64,) LIQD Take 30 mLs by mouth 2 (two) times daily. 0800 and 1700     Historical Provider, MD  cholecalciferol (VITAMIN D) 1000 UNITS tablet Take 1,000 Units by mouth daily.    Historical Provider, MD  diclofenac sodium (VOLTAREN) 1 % GEL Apply 2 g topically 4 (four) times daily. To both knees 06/03/14   Sharee Holstereborah S Green, NP  gabapentin (NEURONTIN) 300 MG capsule Take 3 capsules (900 mg total) by mouth 3 (three) times daily. Patient taking differently: Take 300 mg by mouth 3 (three) times daily.  06/30/14   Sharee Holstereborah S Green, NP  gabapentin (NEURONTIN) 600 MG tablet Take 600 mg by mouth 3 (three) times daily.    Historical Provider, MD  magnesium hydroxide (MILK OF MAGNESIA) 400 MG/5ML suspension Take 30 mLs by mouth every 8 (eight) hours as needed for mild constipation.    Historical Provider, MD  Melatonin 3 MG TABS Take 6 mg by mouth at bedtime as needed (sleep).    Historical Provider, MD  metFORMIN (GLUCOPHAGE-XR) 500 MG 24 hr tablet Take 500 mg by mouth daily with breakfast.    Historical Provider, MD  Multiple Vitamin (MULTIVITAMIN WITH MINERALS) TABS tablet Take 1 tablet by mouth daily.    Historical Provider, MD  OLANZapine (ZYPREXA) 7.5 MG tablet Take 7.5 mg by mouth at bedtime.    Historical Provider, MD  rivaroxaban (XARELTO) 20 MG TABS tablet Take 20 mg by mouth every morning.  Historical Provider, MD  thiamine 100 MG tablet Take 100 mg by mouth daily.    Historical Provider, MD  traMADol (ULTRAM) 50 MG tablet Take one tablet by mouth three times daily for pain; Take one tablet by mouth once daily as needed for pain. Max  Tramadol/day Patient taking differently: Take 50 mg by mouth 3 (three) times daily.  12/28/14   Tiffany L Reed, DO  traMADol (ULTRAM) 50 MG tablet Take 50 mg by mouth every 6 (six) hours as needed (for pain).    Historical Provider, MD  traZODone (DESYREL) 100 MG tablet Take 100 mg by mouth at bedtime as needed for sleep.     Historical Provider, MD   BP 129/77 mmHg  Pulse 112  Temp(Src) 98.8 F (37.1 C) (Oral)  Resp 16  SpO2 97%   LMP 03/25/2015 (Approximate) Physical Exam  Constitutional: She is oriented to person, place, and time. She appears well-developed and well-nourished. No distress.  HENT:  Head: Normocephalic and atraumatic.  Eyes: Conjunctivae are normal.  Cardiovascular: Normal rate.   Pulmonary/Chest: Effort normal.  Abdominal: She exhibits no distension.  Musculoskeletal:  Mild swelling to the right wrist noted. No deformity. Tender to palpation over her dorsal wrist. No pain with range of motion, full range of motion of the wrist. Normal hand. Grip strength is 5 out of 5 and equal bilaterally. Normal elbow and shoulder.  Neurological: She is alert and oriented to person, place, and time.  Skin: Skin is warm and dry.  Psychiatric: She has a normal mood and affect.  Nursing note and vitals reviewed.   ED Course  Procedures  Labs Revie  DIAGNOSTIC STUDIES:  Oxygen Saturation is 97% on RA, normal by my interpretation.    Labs Reviewed - No data to display  Imaging Review Dg Wrist Complete Right  04/23/2015  CLINICAL DATA:  Pain distal right ulna, multiple falls recently EXAM: RIGHT WRIST - COMPLETE 3+ VIEW COMPARISON:  None. FINDINGS: There is no evidence of fracture or dislocation. There is no evidence of arthropathy or other focal bone abnormality. Soft tissues are unremarkable. IMPRESSION: Negative. Electronically Signed   By: Esperanza Heir M.D.   On: 04/23/2015 17:32   I have personally reviewed and evaluated these images as part of my medical decision-making.   EKG Interpretation None      MDM   Final diagnoses:  Wrist sprain, right, initial encounter    patient with a right wrist swelling, apparently injury 6 days ago per nursing home. I did call the nursing home and discussed patient with them. They're not sure the exact mechanism of the injury. Swelling was noticed on the 19th, however she did not complain of pain at that time. He days later she was complaining of pain so x-ray  was ordered. X-ray was performed yesterday and results returned today. Per report, patient has nondisplaced distal ulnar fracture. She was sent here for orthopedics evaluation.  5:54 PM Dedicated wrist films obtained here. No evidence of fracture on the report. Patient was placed in the Velcro splint. Will discharge home with primary care doctor or orthopedics follow-up.  Filed Vitals:   04/23/15 1555  BP: 129/77  Pulse: 112  Temp: 98.8 F (37.1 C)  TempSrc: Oral  Resp: 16  SpO2: 97%     Jaynie Crumble, PA-C 04/23/15 1755  Lavera Guise, MD 04/24/15 1540

## 2015-04-23 NOTE — ED Notes (Addendum)
Per EMS-pt has "confirmed non-displaced right distal ulnar fracture with minimal angular deformity" per mobile x-ray team. Also has confirmed LLE DVT -all per [facility] nursing facility. Unsure if patient is on medication for this DVT (Xarelto listed in medication list). VS: BP 118/69 HR 110 ([facility] says this is "baseline" for pt) RR 16 SpO2 97%. Pt is from [facility]. Hx dementia, multiple falls recently.   PA spoke with [facility] facility-confirm she is only here for distal ulnar fracture and not the DVT diagnosis.

## 2015-04-23 NOTE — Discharge Instructions (Signed)
X-ray in emergency department showed no evidence of a fracture. Keep the wrist in this Velcro splint. Follow-up with either primary care doctor or orthopedist as referred next week.

## 2015-04-25 ENCOUNTER — Encounter: Payer: Self-pay | Admitting: Internal Medicine

## 2015-04-25 NOTE — Assessment & Plan Note (Signed)
she is presently stable her hgb a1c 7.3; will continue metformin 500 mg daily; and will monitor Urine for micro-albumin is 7.4; is on lisinopril 2.5 mg daily

## 2015-04-25 NOTE — Assessment & Plan Note (Signed)
due to alcohol: she is presently without change in status; will continue zyprexa 10 mg daily; thiamine 100 mg daily; trazodone 100 mg nightly for sleep and will monitor

## 2015-04-25 NOTE — Assessment & Plan Note (Signed)
no signs of aspiration present; will not make changes in her current plan of care and will monitor her status.

## 2015-05-27 ENCOUNTER — Encounter: Payer: Self-pay | Admitting: Internal Medicine

## 2015-05-27 ENCOUNTER — Non-Acute Institutional Stay (SKILLED_NURSING_FACILITY): Payer: Medicaid Other | Admitting: Internal Medicine

## 2015-05-27 DIAGNOSIS — F0391 Unspecified dementia with behavioral disturbance: Secondary | ICD-10-CM

## 2015-05-27 DIAGNOSIS — G4700 Insomnia, unspecified: Secondary | ICD-10-CM

## 2015-05-27 DIAGNOSIS — E1142 Type 2 diabetes mellitus with diabetic polyneuropathy: Secondary | ICD-10-CM

## 2015-05-27 DIAGNOSIS — F03918 Unspecified dementia, unspecified severity, with other behavioral disturbance: Secondary | ICD-10-CM

## 2015-05-27 NOTE — Assessment & Plan Note (Signed)
Stable on trazodone; plan -cont current med

## 2015-05-27 NOTE — Progress Notes (Signed)
MRN: [id number] Name: Sarah Mckay  Sex: female Age: 59 y.o. DOB: 09/28/1955  [facility]: Level Of Care: SNF Provider: Merrilee SeashoreALEXANDER, Jajuan Skoog D Emergency Contacts: Extended Emergency Contact Information Primary Emergency Contact: Mickelson,Marcus Address: [address]          [place], [place] [address] [place] Home Phone: [phone] Relation: Brother Secondary Emergency Contact: Dewaine Oatsarpenter,Marie  [place] Home Phone: [phone] Relation: Niece  Code Status:   Allergies: Review of patient's allergies indicates no known allergies.  Chief Complaint  Patient presents with  . Medical Management of Chronic Issues    HPI: Patient is 59 y.o. female with h/o ETOH abuse, seizures, DM2, dementia, dysphagia and psychosis who is being seen for routine issues of polyneuropathy, dementia and insomnia.   Past Medical History  Diagnosis Date  . Seizures (HCC)   . Alcohol abuse   . Diabetes mellitus without complication (HCC)   . Dementia with behavioral problem   . Arthritis   . Dysphagia     and aspiration risk  . 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    Medication List       This list is accurate as of: 05/27/15  8:25 PM.  Always use your most recent med list.               cholecalciferol 1000 units tablet  Commonly known as:  VITAMIN D  Take 1,000 Units by mouth daily.     diclofenac sodium 1 % Gel  Commonly known as:  VOLTAREN  Apply 2 g topically 4 (four) times daily. To both knees     feeding supplement (PRO-STAT SUGAR FREE 64) Liqd  Take 30 mLs by mouth 2 (two) times daily. 0800 and 1700     gabapentin 600 MG tablet  Commonly known as:  NEURONTIN  Take 600 mg by mouth 3 (three) times daily.     gabapentin 300 MG capsule  Commonly known as:  NEURONTIN  Take 3 capsules (900 mg total) by mouth 3 (three) times daily.     magnesium hydroxide 400 MG/5ML suspension  Commonly  known as:  MILK OF MAGNESIA  Take 30 mLs by mouth every 8 (eight) hours as needed for mild constipation.     Melatonin 3 MG Tabs  Take 6 mg by mouth at bedtime as needed (sleep).     metFORMIN 500 MG 24 hr tablet  Commonly known as:  GLUCOPHAGE-XR  Take 500 mg by mouth daily with breakfast.     multivitamin with minerals Tabs tablet  Take 1 tablet by mouth daily.     OLANZapine 7.5 MG tablet  Commonly known as:  ZYPREXA  Take 7.5 mg by mouth at bedtime.     rivaroxaban 20 MG Tabs tablet  Commonly known as:  XARELTO  Take 20 mg by mouth every morning.     thiamine 100 MG tablet  Take 100 mg by mouth daily.     traMADol 50 MG tablet  Commonly known as:  ULTRAM  Take 50 mg by mouth every 6 (six) hours as needed (for pain).     traMADol 50 MG tablet  Commonly known as:  ULTRAM  Take one tablet by mouth three times daily for pain; Take one tablet by mouth once daily as needed for pain. Max 400mg  Tramadol/day     traZODone 100 MG tablet  Commonly known as:  DESYREL  Take 100 mg  by mouth at bedtime as needed for sleep.        No orders of the defined types were placed in this encounter.    Immunization History  Administered Date(s) Administered  . Pneumococcal-Unspecified 06/16/2014  . Tdap 01/18/2014    Social History  Substance Use Topics  . Smoking status: Current Every Day Smoker -- 0.50 packs/day for 15 years    Types: Cigarettes  . Smokeless tobacco: Not on file  . Alcohol Use: 1.2 oz/week    2 Cans of beer per week     Comment: occ    Review of Systems  UTO 2/2 pt dementia; nursing without concerns    Filed Vitals:   05/27/15 1255  BP: 125/75  Pulse: 67  Temp: 97.8 F (36.6 C)  Resp: 18    Physical Exam  GENERAL APPEARANCE: Alert, min conversant, No acute distress frail WF SKIN: No diaphoresis rash, or wounds HEENT: Unremarkable RESPIRATORY: Breathing is even, unlabored. Lung sounds are clear   CARDIOVASCULAR: Heart RRR no murmurs, rubs or  gallops. No peripheral edema  GASTROINTESTINAL: Abdomen is soft, non-tender, not distended w/ normal bowel sounds.  GENITOURINARY: Bladder non tender, not distended  MUSCULOSKELETAL: No abnormal joints or musculature NEUROLOGIC: Cranial nerves 2-12 grossly intact. Moves all extremities PSYCHIATRIC: dementia, no behavioral issues  Patient Active Problem List   Diagnosis Date Noted  . Insomnia 03/25/2015  . Polyneuropathy due to type 2 diabetes mellitus (HCC) 03/25/2015  . Acute deep vein thrombosis (DVT) of left femoral vein (HCC) 02/22/2015  . Diabetes mellitus with neurological manifestations (HCC) 10/22/2014  . 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 Primary osteoarthritis of both knees 06/03/2014  . Chronic pain 05/13/2014  . Alcohol abuse   . Dementia with behavioral problem   . Dysphagia   . 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

## 2015-05-27 NOTE — Assessment & Plan Note (Signed)
Chronic and stable on neurontin 300 mg TID;plan - cont neurontin

## 2015-05-27 NOTE — Assessment & Plan Note (Signed)
Chronic and stable on psych meds only; will continue monitor

## 2015-06-15 LAB — HM DIABETES FOOT EXAM

## 2015-06-29 ENCOUNTER — Non-Acute Institutional Stay (SKILLED_NURSING_FACILITY): Payer: Medicaid Other | Admitting: Adult Health

## 2015-06-29 DIAGNOSIS — L89214 Pressure ulcer of right hip, stage 4: Secondary | ICD-10-CM

## 2015-06-29 DIAGNOSIS — I82412 Acute embolism and thrombosis of left femoral vein: Secondary | ICD-10-CM

## 2015-06-29 DIAGNOSIS — M170 Bilateral primary osteoarthritis of knee: Secondary | ICD-10-CM

## 2015-06-29 DIAGNOSIS — G8929 Other chronic pain: Secondary | ICD-10-CM

## 2015-06-29 DIAGNOSIS — F10959 Alcohol use, unspecified with alcohol-induced psychotic disorder, unspecified: Secondary | ICD-10-CM

## 2015-06-29 DIAGNOSIS — F0391 Unspecified dementia with behavioral disturbance: Secondary | ICD-10-CM | POA: Diagnosis not present

## 2015-06-29 DIAGNOSIS — F03918 Unspecified dementia, unspecified severity, with other behavioral disturbance: Secondary | ICD-10-CM

## 2015-06-29 DIAGNOSIS — E1142 Type 2 diabetes mellitus with diabetic polyneuropathy: Secondary | ICD-10-CM

## 2015-06-29 DIAGNOSIS — R1310 Dysphagia, unspecified: Secondary | ICD-10-CM

## 2015-07-05 ENCOUNTER — Encounter: Payer: Self-pay | Admitting: Internal Medicine

## 2015-07-05 DIAGNOSIS — R627 Adult failure to thrive: Secondary | ICD-10-CM | POA: Insufficient documentation

## 2015-07-05 NOTE — Progress Notes (Signed)
Patient ID: Sarah Mckay, female   DOB: Dec 06, 1955, 60 y.o.   MRN: [id number]    DATE: 01/07/15  Location:  [facility]    Place of Service: SNF (31)   Extended Emergency Contact Information Primary Emergency Contact: Guereca,Marcus Address: [address]          [place], [place] [address] [place] Home Phone: [phone] Relation: Brother Secondary Emergency Contact: Dewaine Oats [place] Home Phone: [phone] Relation: Niece  Advanced Directive information  FULL CODE  Chief Complaint  Patient presents with  . Medical Management of Chronic Issues    HPI:  60 yo female long term resident seen today for f/u. She has no c/o. No nursing issues. No falls. Wound care is following right hip ulcer. Pt is a poor historian due to dementia. Hx obtained from chart  DM - she gets weekly CBGs. No low BS reactions. Takes metformin. Last A1c 6.4%. She is on a ACEI. 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 Current weight is 113 pounds. She takes nutritional supplements per facility protocol. Her pre-albumin is 7  Right  hip stage IV wound - followed by wound care.   DVT left leg - on xarelto 20 mg daily   Past Medical History  Diagnosis Date  . Seizures (HCC)   . Alcohol abuse   . Diabetes mellitus without complication (HCC)   . Dementia with behavioral problem   . Arthritis   . Dysphagia     and aspiration risk  . 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  Patient Care Team: Margit Hanks, MD as PCP - General (Internal Medicine) Sharee Holster, NP as Nurse Practitioner (Nurse Practitioner)  Social History   Social History  . Marital Status: Unknown    Spouse Name: N/A  . Number of Children: N/A  . Years of Education: N/A   Occupational History  . Not on file.   Social History Main Topics  . Smoking status: Current Every Day Smoker -- 0.50 packs/day for 15 years    Types: Cigarettes  . Smokeless tobacco: Not on file  . Alcohol Use: 1.2 oz/week    2 Cans of beer per week     Comment: occ  . Drug Use: No  . Sexual Activity: Not Currently   Other Topics Concern  . Not on file   Social History Narrative   ** Merged History Encounter **         reports that she has been smoking Cigarettes.  She has a 7.5 pack-year smoking history. She does not have any smokeless tobacco history on file. She reports that she drinks about 1.2 oz of alcohol per week. She reports that she does not use illicit drugs.  Immunization History  Administered Date(s) Administered  . Pneumococcal-Unspecified 06/16/2014  . Tdap 01/18/2014    No Known Allergies  Medications: Patient's Medications  New Prescriptions   No medications on file  Previous Medications   AMINO ACIDS-PROTEIN  HYDROLYS (FEEDING SUPPLEMENT, PRO-STAT SUGAR FREE 64,) LIQD    Take 30 mLs by mouth 2 (two) times daily. 0800 and 1700   CHOLECALCIFEROL (VITAMIN D) 1000 UNITS TABLET    Take 1,000 Units by mouth daily.   DICLOFENAC SODIUM (VOLTAREN) 1 % GEL    Apply 2 g topically 4 (four) times daily. To both knees   GABAPENTIN (NEURONTIN) 300 MG CAPSULE    Take 3 capsules (900 mg total) by mouth 3 (three) times daily.   GABAPENTIN (NEURONTIN) 600 MG TABLET    Take 600 mg by mouth 3 (three) times daily.   MAGNESIUM HYDROXIDE (MILK OF MAGNESIA) 400 MG/5ML SUSPENSION    Take 30 mLs by mouth every 8 (eight) hours as needed for mild constipation.   MELATONIN 3 MG TABS    Take 6 mg by mouth at bedtime as needed  (sleep).   METFORMIN (GLUCOPHAGE-XR) 500 MG 24 HR TABLET    Take 500 mg by mouth daily with breakfast.   MULTIPLE VITAMIN (MULTIVITAMIN WITH MINERALS) TABS TABLET    Take 1 tablet by mouth daily.   OLANZAPINE (ZYPREXA) 7.5 MG TABLET    Take 7.5 mg by mouth at bedtime.   RIVAROXABAN (XARELTO) 20 MG TABS TABLET    Take 20 mg by mouth every morning.    THIAMINE 100 MG TABLET    Take 100 mg by mouth daily.   TRAMADOL (ULTRAM) 50 MG TABLET    Take one tablet by mouth three times daily for pain; Take one tablet by mouth once daily as needed for pain. Max 400mg  Tramadol/day   TRAMADOL (ULTRAM) 50 MG TABLET    Take 50 mg by mouth every 6 (six) hours as needed (for pain).   TRAZODONE (DESYREL) 100 MG TABLET    Take 100 mg by mouth at bedtime as needed for sleep.   Modified Medications   No medications on file  Discontinued Medications   No medications on file    Review of Systems  Unable to perform ROS: Dementia    Filed Vitals:   01/07/15 1157  BP: 104/58  Pulse: 66  Temp: 97.6 F (36.4 C)  Weight: 113 lb (51.256 kg)  SpO2: 97%   Body mass index is 19.39 kg/(m^2).  Physical Exam  Constitutional: She appears well-developed.  Sitting in w/c in NAD, frail appearing  HENT:  Mouth/Throat: Oropharynx is clear and moist. No oropharyngeal exudate.  Eyes: Pupils are equal, round, and reactive to light. No scleral icterus.  Neck: Neck supple. Carotid bruit is not present. No tracheal deviation present. No thyromegaly present.  Cardiovascular: Regular rhythm, normal heart sounds and intact distal pulses.  Tachycardia present.  Exam reveals no gallop and no friction rub.   No murmur heard. +1 pitting LLE edema. No calf TTP. No RLE edema  Pulmonary/Chest: Effort normal and breath sounds normal. No stridor. No respiratory distress. She has no wheezes. She has no rales.  Abdominal: Soft. Bowel sounds are normal. She exhibits no distension and no mass. There is no hepatomegaly. There is no  tenderness. There is no rebound and no guarding.  Musculoskeletal: She exhibits edema and tenderness.  Lymphadenopathy:    She has no cervical adenopathy.  Neurological: She is alert.  Skin: Skin is warm and dry. No rash noted.  Stage 4 right hip pressure ulcer; wound vac intact and functioning properly  Psychiatric: She has a normal mood and affect. Her behavior is normal.     Labs reviewed:  09-15-14: pre-albumin 17  09-16-14: wbc 9.7; hgb 11.0; hct 34.4; mcv 88.2; plt 319; glucose 106; bun 15; creat 0.6; k+4.3; na++138; liver normal albumin 3.8   11-06-14: pre-albumin 7     No results found.   Assessment/Plan   ICD-9-CM ICD-10-CM   1. 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 Dementia with behavioral problem 294.21 F03.91   3. Chronic pain due to #6 338.29 G89.29   4. Type 2 diabetes mellitus with diabetic neuropathy, without long-term current use of insulin (HCC) 250.60 E11.40    357.2    5. 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 Primary osteoarthritis of both knees 715.16 M17.0   7. FTT (failure to thrive) in adult 783.7 R62.7     Wound care as ordered. Wound vac as ordered.   Air mattress and float heels when in bed  Cont nutritional supplements as ordered  PT/OT as ordered  CBGs weekly  F/u with Ortho as scheduled  Will follow  Tavaughn Silguero S. Ancil Linsey  [facility] [address] [place], [place] [address] [phone] Cell (Monday-Friday 8 AM - 5 PM) [phone] After 5 PM and follow prompts

## 2015-07-14 LAB — BASIC METABOLIC PANEL
BUN: 10 mg/dL (ref 4–21)
BUN: 10 mg/dL (ref 4–21)
CREATININE: 0.6 mg/dL (ref 0.5–1.1)
CREATININE: 0.6 mg/dL (ref 0.5–1.1)
Glucose: 141 mg/dL
Glucose: 141 mg/dL
POTASSIUM: 4.1 mmol/L (ref 3.4–5.3)
Potassium: 4.1 mmol/L (ref 3.4–5.3)
SODIUM: 143 mmol/L (ref 137–147)
Sodium: 143 mmol/L (ref 137–147)

## 2015-07-26 ENCOUNTER — Non-Acute Institutional Stay (SKILLED_NURSING_FACILITY): Payer: Medicaid Other | Admitting: Internal Medicine

## 2015-07-26 ENCOUNTER — Encounter: Payer: Self-pay | Admitting: Internal Medicine

## 2015-07-26 DIAGNOSIS — F10959 Alcohol use, unspecified with alcohol-induced psychotic disorder, unspecified: Secondary | ICD-10-CM

## 2015-07-26 DIAGNOSIS — I82412 Acute embolism and thrombosis of left femoral vein: Secondary | ICD-10-CM | POA: Diagnosis not present

## 2015-07-26 DIAGNOSIS — R1310 Dysphagia, unspecified: Secondary | ICD-10-CM | POA: Diagnosis not present

## 2015-07-26 NOTE — Progress Notes (Signed)
MRN: [id number] Name: Sarah Mckay  Sex: female Age: 60 y.o. DOB: 06-21-1955  PSC #: Ronni Rumble Facility/Room: Level Of Care: SNF Provider: Merrilee Seashore D Emergency Contacts: Extended Emergency Contact Information Primary Emergency Contact: Morioka,Marcus Address: [address]          [place], [place] [address] [place] Home Phone: [phone] Relation: Brother Secondary Emergency Contact: Dewaine Oats [place] Home Phone: [phone] Relation: Niece  Code Status:   Allergies: Review of patient's allergies indicates no known allergies.  Chief Complaint  Patient presents with  . Medical Management of Chronic Issues    HPI: Patient is 60 y.o. female who is being seen for routine issues of DVT LLE, dysphagia and alcohol induced psychosis.  Past Medical History  Diagnosis Date  . Seizures (HCC)   . Alcohol abuse   . Diabetes mellitus without complication (HCC)   . Dementia with behavioral problem   . Arthritis   . Dysphagia     and aspiration risk  . 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    Medication List       This list is accurate as of: 07/26/15 11:59 PM.  Always use your most recent med list.               cholecalciferol 1000 units tablet  Commonly known as:  VITAMIN D  Take 1,000 Units by mouth daily.     diclofenac sodium 1 % Gel  Commonly known as:  VOLTAREN  Apply 2 g topically 4 (four) times daily. To both knees     feeding supplement (PRO-STAT SUGAR FREE 64) Liqd  Take 30 mLs by mouth 2 (two) times daily. 0800 and 1700     gabapentin 600 MG tablet  Commonly known as:  NEURONTIN  Take 600 mg by mouth 3 (three) times daily.     gabapentin 300 MG capsule  Commonly known as:  NEURONTIN  Take 3 capsules (900 mg total) by mouth 3 (three) times daily.     magnesium hydroxide 400 MG/5ML suspension  Commonly known as:  MILK OF MAGNESIA  Take 30 mLs by mouth every 8  (eight) hours as needed for mild constipation.     Melatonin 3 MG Tabs  Take 6 mg by mouth at bedtime as needed (sleep).     metFORMIN 500 MG 24 hr tablet  Commonly known as:  GLUCOPHAGE-XR  Take 500 mg by mouth daily with breakfast.     multivitamin with minerals Tabs tablet  Take 1 tablet by mouth daily.     OLANZapine 7.5 MG tablet  Commonly known as:  ZYPREXA  Take 7.5 mg by mouth at bedtime.     rivaroxaban 20 MG Tabs tablet  Commonly known as:  XARELTO  Take 20 mg by mouth every morning.     thiamine 100 MG tablet  Take 100 mg by mouth daily.     traMADol 50 MG tablet  Commonly known as:  ULTRAM  Take 50 mg by mouth every 6 (six) hours as needed (for pain).     traMADol 50 MG tablet  Commonly known as:  ULTRAM  Take one tablet by mouth three times daily for pain; Take one tablet by mouth once daily as needed for pain. Max  Tramadol/day     traZODone 100 MG tablet  Commonly known as:  DESYREL  Take 100 mg by mouth at bedtime as needed for sleep.  No orders of the defined types were placed in this encounter.    Immunization History  Administered Date(s) Administered  . Pneumococcal-Unspecified 06/16/2014  . Tdap 01/18/2014    Social History  Substance Use Topics  . Smoking status: Current Every Day Smoker -- 0.50 packs/day for 15 years    Types: Cigarettes  . Smokeless tobacco: Not on file  . Alcohol Use: 1.2 oz/week    2 Cans of beer per week     Comment: occ    Review of Systems  DATA OBTAINED: from patient, nurse GENERAL:  no fevers, fatigue, appetite changes SKIN: No itching, rash HEENT: No complaint RESPIRATORY: No cough, wheezing, SOB CARDIAC: No chest pain, palpitations, lower extremity edema  GI: No abdominal pain, No N/V/D or constipation, No heartburn or reflux  GU: No dysuria, frequency or urgency, or incontinence  MUSCULOSKELETAL: No unrelieved bone/joint pain NEUROLOGIC: No headache, dizziness  PSYCHIATRIC: No overt  anxiety or sadness  Filed Vitals:   07/26/15 1442  BP: 112/68  Pulse: 90  Temp: 97.7 F (36.5 C)  Resp: 20    Physical Exam  GENERAL APPEARANCE: Alert, min conversant, No acute distress  SKIN: No diaphoresis rash with dementia HEENT: Unremarkable RESPIRATORY: Breathing is even, unlabored. Lung sounds are clear   CARDIOVASCULAR: Heart RRR no murmurs, rubs or gallops. 1+ peripheral edema  GASTROINTESTINAL: Abdomen is soft, non-tender, not distended w/ normal bowel sounds.  GENITOURINARY: Bladder non tender, not distended  MUSCULOSKELETAL: No abnormal joints or musculature NEUROLOGIC: Cranial nerves 2-12 grossly intact. Moves all extremities PSYCHIATRIC: dementia, no behavioral issues  Patient Active Problem List   Diagnosis Date Noted  . FTT (failure to thrive) in adult 07/05/2015  . Insomnia 03/25/2015  . Polyneuropathy due to type 2 diabetes mellitus (HCC) 03/25/2015  . Acute deep vein thrombosis (DVT) of left femoral vein (HCC) 02/22/2015  . Diabetes mellitus with neurological manifestations (HCC) 10/22/2014  . 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 Primary osteoarthritis of both knees 06/03/2014  . Chronic pain 05/13/2014  . Alcohol abuse   . Dementia with behavioral problem   . Dysphagia   . 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

## 2015-08-01 ENCOUNTER — Encounter: Payer: Self-pay | Admitting: Internal Medicine

## 2015-08-01 NOTE — Assessment & Plan Note (Signed)
Pt has been on xarelto since 09/2014 with no clot 10/16 ;plan - d/c xarelto

## 2015-08-01 NOTE — Assessment & Plan Note (Signed)
Without change, cont zyprexa 10 mg daily and thiamine 100 mg daily

## 2015-08-01 NOTE — Assessment & Plan Note (Signed)
No signs or sx of aspiration; cont current mr=eds and cont to monitor status

## 2015-08-16 NOTE — Progress Notes (Signed)
Patient ID: Sarah Mckay, female   DOB: [birth year], 60 y.o.   MRN: [id number]    [facility]ility[facility]       No Known Allergies  Chief Complaint  Patient presents with  . Medical Management of Chronic Issues    HPI:  She is a long term resident of this facility being seen for the management of her chronic illnesses. Overall there is little change in her status. She is unable to fully participate in the hpi or ros but did tell me that she is feeling good. There are no nursing concerns at this time.   Past Medical History  Diagnosis Date  . Seizures (HCC)   . Alcohol abuse   . Diabetes mellitus without complication (HCC)   . Dementia with behavioral problem   . Arthritis   . Dysphagia     and aspiration risk  . 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  VITAL SIGNS BP 118/54 mmHg  Pulse 98  Ht  (1.676 m)  Wt 140 lb (63.504 kg)  BMI 22.61 kg/m2  SpO2 96%  Patient's Medications  New Prescriptions   No medications on file  Previous Medications   AMINO ACIDS-PROTEIN HYDROLYS (FEEDING SUPPLEMENT, PRO-STAT SUGAR FREE 64,) LIQD    Take 30 mLs by mouth 2 (two) times daily. 0800 and 1700   CHOLECALCIFEROL (VITAMIN D) 1000 UNITS TABLET    Take 1,000 Units by mouth daily.   DICLOFENAC SODIUM (VOLTAREN) 1 % GEL    Apply 2 g topically 4 (four) times daily. To both knees   GABAPENTIN (NEURONTIN) 300 MG CAPSULE    Take 3 capsules (900 mg total) by mouth 3 (three) times daily.   GABAPENTIN (NEURONTIN) 600 MG TABLET    Take 600 mg by mouth 3 (three) times daily.   MAGNESIUM HYDROXIDE (MILK OF MAGNESIA) 400 MG/5ML SUSPENSION    Take 30 mLs by mouth every 8 (eight) hours as needed for mild constipation.   MELATONIN 3 MG TABS    Take 6 mg by mouth at bedtime as needed (sleep).   METFORMIN (GLUCOPHAGE-XR) 500 MG 24 HR TABLET    Take 500 mg by mouth daily with breakfast.   MULTIPLE VITAMIN (MULTIVITAMIN WITH MINERALS) TABS TABLET    Take 1 tablet by  mouth daily.   OLANZAPINE (ZYPREXA) 7.5 MG TABLET    Take 7.5 mg by mouth at bedtime.   RIVAROXABAN (XARELTO) 20 MG TABS TABLET    Take 20 mg by mouth every morning.    THIAMINE 100 MG TABLET    Take 100 mg by mouth daily.   TRAMADOL (ULTRAM) 50 MG TABLET    Take one tablet by mouth three times daily for pain; Take one tablet by mouth once daily as needed for pain. Max  Tramadol/day   TRAMADOL (ULTRAM) 50 MG TABLET    Take 50 mg by mouth every 6 (six) hours as needed (for pain).   TRAZODONE (DESYREL) 100 MG TABLET    Take 100 mg by mouth at bedtime as needed for sleep.   Modified Medications   No medications on file  Discontinued Medications   No medications on file     SIGNIFICANT DIAGNOSTIC EXAMS  Not a candidate for dexa; mammogram or colonoscopy    10-02-14: left leg doppler: +dvt  10-08-14: bilateral hip /pelvic fracture: right hip with marked osteoarthritis  04-23-15: right distal ulna and forearm: no acute fracture    LABS REVIEWED:  06-17-14: urine for micro-albumin 7.4  08-12-14: guaiac stool: neg 09-15-14: pre-albumin 17 09-16-14: wbc 9.7; hgb 11.0; hct 34.4; mcv 88.2; plt 319; glucose 106; bun 15; creat 0.6; k+4.3; na++138; liver normal albumin 3.8  11-06-14: pre-albumin 7  12-11-14; hgb a1c 7.3      Review of Systems Unable to perform ROS: dementia    Physical Exam Constitutional: No distress.  Frail   Neck: Neck supple. No JVD present. No thyromegaly present.  Cardiovascular: Normal rate, regular rhythm and intact distal pulses.   Respiratory: Effort normal and breath sounds normal. No respiratory distress.  GI: Soft. Bowel sounds are normal. She exhibits no distension. There is no tenderness.  Musculoskeletal: She exhibits no edema.  Is able to move all extremities   Neurological: She is alert.  Skin: Skin is warm and dry. She is not diaphoretic. Right hip ulcer: 0.2 x 0.1 x 0.11 cm using hydroel     ASSESSMENT/ PLAN:  1. Diabetes: she is  presently stable her hgb a1c 7.3; will continue metformin 500 mg daily;   and will monitor  Urine for micro-albumin is 7.4; is on lisinopril 2.5 mg daily   2. Dysphagia: no signs of aspiration present; will not make changes in her current plan of care and will monitor her status.   3. Chronic pain with osteoarthritis of both knees: she is  getting adequate pain relief: will continue voltaren gel 2 gm to both knees four times daily; will continue neurontin  900 mg three times daily; and will continue ultram  50 mg three times daily and every 6 hours as needed; and will monitor her status.   4. Psychosis; due to alcohol: she is presently without change in status; will continue zyprexa 7.5 mg daily; thiamine 100 mg daily; trazodone 100 mg nightly for sleep and will monitor  5. 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 Her current weight is 140 pounds.   6. Right hip stage IV ulcer: will continue current wound care and will monitor   6. FTT: her current weight is 121 pounds: will continue supplements per facility protocol and will continue to monitor her status.  Her pre-albumin is 7 will continue prostat 30 cc twice daily   7. Right  hip stage IV wound: will continue wound care per facility wound care physician and will monitor her status.   8. DVT left leg: will continue xarelto 20 mg daily and will monitor her status. She has been on this since 10-02-14.      Will check cbc; cmp; hgb a1c and lipids       Synthia Innocenteborah Fishel Wamble NP [facility]  Contact [phone] Monday through Friday 8am- 5pm  After hours call [phone]

## 2015-08-25 ENCOUNTER — Inpatient Hospital Stay (HOSPITAL_COMMUNITY)
Admission: EM | Admit: 2015-08-25 | Discharge: 2015-08-28 | DRG: 872 | Disposition: A | Discharge status: Skilled Nursing Facility | Payer: Medicaid Other | Attending: Internal Medicine | Admitting: Internal Medicine

## 2015-08-25 ENCOUNTER — Encounter: Payer: Self-pay | Admitting: Adult Health

## 2015-08-25 ENCOUNTER — Encounter (HOSPITAL_COMMUNITY): Payer: Self-pay

## 2015-08-25 ENCOUNTER — Emergency Department (HOSPITAL_COMMUNITY): Payer: Medicaid Other

## 2015-08-25 ENCOUNTER — Non-Acute Institutional Stay (SKILLED_NURSING_FACILITY): Payer: Medicaid Other | Admitting: Adult Health

## 2015-08-25 DIAGNOSIS — Z7984 Long term (current) use of oral hypoglycemic drugs: Secondary | ICD-10-CM

## 2015-08-25 DIAGNOSIS — M170 Bilateral primary osteoarthritis of knee: Secondary | ICD-10-CM | POA: Diagnosis present

## 2015-08-25 DIAGNOSIS — Z79899 Other long term (current) drug therapy: Secondary | ICD-10-CM

## 2015-08-25 DIAGNOSIS — I82412 Acute embolism and thrombosis of left femoral vein: Secondary | ICD-10-CM | POA: Diagnosis not present

## 2015-08-25 DIAGNOSIS — M25562 Pain in left knee: Secondary | ICD-10-CM

## 2015-08-25 DIAGNOSIS — D649 Anemia, unspecified: Secondary | ICD-10-CM | POA: Diagnosis present

## 2015-08-25 DIAGNOSIS — E1142 Type 2 diabetes mellitus with diabetic polyneuropathy: Secondary | ICD-10-CM

## 2015-08-25 DIAGNOSIS — F10959 Alcohol use, unspecified with alcohol-induced psychotic disorder, unspecified: Secondary | ICD-10-CM | POA: Diagnosis not present

## 2015-08-25 DIAGNOSIS — K5900 Constipation, unspecified: Secondary | ICD-10-CM | POA: Diagnosis present

## 2015-08-25 DIAGNOSIS — F0391 Unspecified dementia with behavioral disturbance: Secondary | ICD-10-CM | POA: Diagnosis not present

## 2015-08-25 DIAGNOSIS — A419 Sepsis, unspecified organism: Principal | ICD-10-CM | POA: Diagnosis present

## 2015-08-25 DIAGNOSIS — K921 Melena: Secondary | ICD-10-CM | POA: Insufficient documentation

## 2015-08-25 DIAGNOSIS — R140 Abdominal distension (gaseous): Secondary | ICD-10-CM | POA: Diagnosis present

## 2015-08-25 DIAGNOSIS — G8929 Other chronic pain: Secondary | ICD-10-CM

## 2015-08-25 DIAGNOSIS — E1140 Type 2 diabetes mellitus with diabetic neuropathy, unspecified: Secondary | ICD-10-CM | POA: Diagnosis present

## 2015-08-25 DIAGNOSIS — L89214 Pressure ulcer of right hip, stage 4: Secondary | ICD-10-CM

## 2015-08-25 DIAGNOSIS — N390 Urinary tract infection, site not specified: Secondary | ICD-10-CM | POA: Diagnosis present

## 2015-08-25 DIAGNOSIS — G40909 Epilepsy, unspecified, not intractable, without status epilepticus: Secondary | ICD-10-CM | POA: Diagnosis present

## 2015-08-25 DIAGNOSIS — F419 Anxiety disorder, unspecified: Secondary | ICD-10-CM | POA: Diagnosis present

## 2015-08-25 DIAGNOSIS — K625 Hemorrhage of anus and rectum: Secondary | ICD-10-CM | POA: Diagnosis present

## 2015-08-25 DIAGNOSIS — K5909 Other constipation: Secondary | ICD-10-CM | POA: Diagnosis present

## 2015-08-25 DIAGNOSIS — M79605 Pain in left leg: Secondary | ICD-10-CM | POA: Diagnosis present

## 2015-08-25 DIAGNOSIS — F03918 Unspecified dementia, unspecified severity, with other behavioral disturbance: Secondary | ICD-10-CM

## 2015-08-25 DIAGNOSIS — Z7901 Long term (current) use of anticoagulants: Secondary | ICD-10-CM

## 2015-08-25 DIAGNOSIS — F17210 Nicotine dependence, cigarettes, uncomplicated: Secondary | ICD-10-CM | POA: Diagnosis present

## 2015-08-25 DIAGNOSIS — Z86718 Personal history of other venous thrombosis and embolism: Secondary | ICD-10-CM

## 2015-08-25 DIAGNOSIS — R000 Tachycardia, unspecified: Secondary | ICD-10-CM | POA: Diagnosis present

## 2015-08-25 DIAGNOSIS — W19XXXA Unspecified fall, initial encounter: Secondary | ICD-10-CM | POA: Diagnosis present

## 2015-08-25 LAB — CBC WITH DIFFERENTIAL/PLATELET
BASOS PCT: 0 %
Basophils Absolute: 0 10*3/uL (ref 0.0–0.1)
EOS ABS: 0.4 10*3/uL (ref 0.0–0.7)
Eosinophils Relative: 3 %
HCT: 36.5 % (ref 36.0–46.0)
Hemoglobin: 11.9 g/dL — ABNORMAL LOW (ref 12.0–15.0)
LYMPHS ABS: 3.9 10*3/uL (ref 0.7–4.0)
Lymphocytes Relative: 31 %
MCH: 26.9 pg (ref 26.0–34.0)
MCHC: 32.6 g/dL (ref 30.0–36.0)
MCV: 82.4 fL (ref 78.0–100.0)
MONOS PCT: 6 %
Monocytes Absolute: 0.8 10*3/uL (ref 0.1–1.0)
NEUTROS PCT: 60 %
Neutro Abs: 7.6 10*3/uL (ref 1.7–7.7)
PLATELETS: 299 10*3/uL (ref 150–400)
RBC: 4.43 MIL/uL (ref 3.87–5.11)
RDW: 15.3 % (ref 11.5–15.5)
WBC: 12.8 10*3/uL — AB (ref 4.0–10.5)

## 2015-08-25 LAB — HEMOGLOBIN AND HEMATOCRIT, BLOOD
HCT: 34.3 % — ABNORMAL LOW (ref 36.0–46.0)
HEMOGLOBIN: 11.8 g/dL — AB (ref 12.0–15.0)

## 2015-08-25 LAB — COMPREHENSIVE METABOLIC PANEL
ALBUMIN: 3.7 g/dL (ref 3.5–5.0)
ALT: 20 U/L (ref 14–54)
ANION GAP: 13 (ref 5–15)
AST: 26 U/L (ref 15–41)
Alkaline Phosphatase: 104 U/L (ref 38–126)
BUN: 20 mg/dL (ref 6–20)
CHLORIDE: 105 mmol/L (ref 101–111)
CO2: 24 mmol/L (ref 22–32)
Calcium: 9.5 mg/dL (ref 8.9–10.3)
Creatinine, Ser: 0.62 mg/dL (ref 0.44–1.00)
GFR calc non Af Amer: >60 mL/min (ref >60)
GLUCOSE: 99 mg/dL (ref 65–99)
POTASSIUM: 4.3 mmol/L (ref 3.5–5.1)
SODIUM: 142 mmol/L (ref 135–145)
Total Bilirubin: 0.3 mg/dL (ref 0.3–1.2)
Total Protein: 8.1 g/dL (ref 6.5–8.1)

## 2015-08-25 LAB — PROTIME-INR
INR: 1.41 (ref 0.00–1.49)
Prothrombin Time: 17.4 seconds — ABNORMAL HIGH (ref 11.6–15.2)

## 2015-08-25 LAB — POC OCCULT BLOOD, ED: Fecal Occult Bld: POSITIVE — AB

## 2015-08-25 LAB — CBG MONITORING, ED: GLUCOSE-CAPILLARY: 168 mg/dL — AB (ref 65–99)

## 2015-08-25 LAB — APTT: APTT: 41 s — AB (ref 24–37)

## 2015-08-25 MED ORDER — IOPAMIDOL (ISOVUE-300) INJECTION 61%
100.0000 mL | Freq: Once | INTRAVENOUS | Status: AC | PRN
  Administered 2015-08-25: 100 mL via INTRAVENOUS

## 2015-08-25 MED ORDER — SODIUM CHLORIDE 0.9 % IV BOLUS (SEPSIS)
1000.0000 mL | Freq: Once | INTRAVENOUS | Status: AC
  Administered 2015-08-25: 1000 mL via INTRAVENOUS

## 2015-08-25 NOTE — Progress Notes (Signed)
Patient ID: Sarah Mckay, female   DOB: 24-Aug-1955, 60 y.o.   MRN: [id number].   Facility:  [facility]       No Known Allergies  Chief Complaint  Patient presents with  . Medical Management of Chronic Issues    Follow up    HPI:  She is a long term resident of this facility being seen for the management of her chronic illnesses. She tells me today that she is having constipation; although she is unable to fully participate in the hpi or ros. There are no nursing concerns at this time.    Past Medical History  Diagnosis Date  . Seizures (HCC)   . Alcohol abuse   . Diabetes mellitus without complication (HCC)   . Dementia with behavioral problem   . Arthritis   . Dysphagia     and aspiration risk  . 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 No pertinent past surgical history.  VITAL SIGNS BP 114/68 mmHg  Pulse 99  Temp(Src) 98.5 F (36.9 C) (Oral)  Resp 18  Ht  (1.676 m)  Wt 150 lb (68.04 kg)  BMI 24.22 kg/m2  SpO2 98%  Patient's Medications  New Prescriptions   No medications on file  Previous Medications   AMINO ACIDS-PROTEIN HYDROLYS (FEEDING SUPPLEMENT, PRO-STAT SUGAR FREE 64,) LIQD    Take 30 mLs by mouth 2 (two) times daily. 0800 and 1700   CHOLECALCIFEROL (VITAMIN D) 1000 UNITS TABLET    Take 1,000 Units by mouth daily.   DICLOFENAC SODIUM (VOLTAREN) 1 % GEL    Apply 2 g topically 4 (four) times daily. To both knees   GABAPENTIN (NEURONTIN) 300 MG CAPSULE    Take 3 capsules (900 mg total) by mouth 3 (three) times daily.   LORAZEPAM (ATIVAN) 0.5 MG TABLET    Take 0.5 mg by mouth every 8 (eight) hours as needed for anxiety.   MELATONIN 3 MG TABS    Take 3 mg by mouth at bedtime as needed (sleep).    METFORMIN (GLUCOPHAGE-XR) 500 MG 24 HR TABLET    Take 500 mg by mouth daily with breakfast.   MULTIPLE VITAMIN (MULTIVITAMIN WITH MINERALS) TABS TABLET    Take 2 tablets by mouth at bedtime.    OLANZAPINE (ZYPREXA) 7.5 MG TABLET    Take 7.5 mg  by mouth at bedtime.   RIVAROXABAN (XARELTO) 20 MG TABS TABLET    Take 20 mg by mouth every morning.    THIAMINE 100 MG TABLET    Take 100 mg by mouth daily.   TRAMADOL (ULTRAM) 50 MG TABLET    Take one tablet by mouth three times daily for pain; Take one tablet by mouth once daily as needed for pain. Max  Tramadol/day  Modified Medications   No medications on file  Discontinued Medications     SIGNIFICANT DIAGNOSTIC EXAMS  Not a candidate for dexa; mammogram or colonoscopy    10-02-14: left leg doppler: +dvt  10-08-14: bilateral hip /pelvic fracture: right hip with marked osteoarthritis  04-23-15: right distal ulna and forearm: no acute fracture    LABS REVIEWED:   06-17-14: urine for micro-albumin 7.4  08-12-14: guaiac stool: neg 09-15-14: pre-albumin 17 09-16-14: wbc 9.7; hgb 11.0; hct 34.4; mcv 88.2; plt 319; glucose 106; bun 15; creat 0.6; k+4.3; na++138; liver normal albumin 3.8  11-06-14: pre-albumin 7  12-11-14; hgb a1c 7.3 07-14-15: glucose 141; bun 10.3; creat 0.58; k+ 4.1; na++143  Review of Systems Unable to perform ROS: dementia    Physical Exam Constitutional: No distress.  Frail   Neck: Neck supple. No JVD present. No thyromegaly present.  Cardiovascular: Normal rate, regular rhythm and intact distal pulses.   Respiratory: Effort normal and breath sounds normal. No respiratory distress.  GI: Soft. Bowel sounds are normal. She exhibits no distension. There is no tenderness.  Musculoskeletal: She exhibits no edema.  Is able to move all extremities   Neurological: She is alert.  Skin: Skin is warm and dry. She is not diaphoretic. Right hip ulcer: 0.2 x 0.1 x 0.11 cm using hydroel     ASSESSMENT/ PLAN:  1. Diabetes: she is presently stable her hgb a1c 7.3; will continue metformin 500 mg daily;   and will monitor  Urine for micro-albumin is 7.4; is on lisinopril 2.5 mg daily   2. Dysphagia: no signs of aspiration present; will not make changes in  her current plan of care and will monitor her status.   3. Chronic pain with osteoarthritis of both knees: she is  getting adequate pain relief: will continue voltaren gel 2 gm to both knees four times daily; will continue neurontin  900 mg three times daily; and will continue ultram  50 mg three times daily and every 6 hours as needed; and will monitor her status.   4. Psychosis; due to alcohol: she is presently without change in status; will continue zyprexa 7.5 mg daily; thiamine 100 mg daily; trazodone 100 mg nightly for sleep and will monitor  5. 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 Her current weight is 140 pounds.   6. Right hip stage IV ulcer: will continue current wound care and will monitor   6. FTT: her current weight is 121 pounds: will continue supplements per facility protocol and will continue to monitor her status.  Her pre-albumin is 7 will continue prostat 30 cc twice daily   7. Right  hip stage IV wound: will continue wound care per facility wound care physician and will monitor her status.   8. DVT left leg: will continue xarelto 20 mg daily and will monitor her status. She has been on this since 10-02-14.   9. Constipation: will begin miralax 17 gm dail     Will check cbc; cmp lipids; tsh hgb a1c urine micor-albumin      This PM I was asked to see her; the nursing staff reports that she has had a large bloody stool. Her rectal did show a moderate amount of dark blood present.     Time spent with patient  45   minutes >50% time spent counseling; reviewing medical record; tests; labs; and developing future plan of care    Synthia InnocentDeborah Kadon Andrus NP [facility]  Contact [phone] Monday through Friday 8am- 5pm  After hours call [phone]

## 2015-08-25 NOTE — ED Notes (Signed)
PT RECEIVED VIA EMS SENT FROM [facility] FOR RECTAL BLEEDING. PER EMS, THE STAFF MEMBER HAD A POSITIVE OCCULT EXAM, AND "A LARGE BLOOD BALL" WAS PULLED FROM HER RECTUM.

## 2015-08-25 NOTE — ED Notes (Signed)
Bed: WA22 Expected date:  Expected time:  Means of arrival:  Comments: EMS 

## 2015-08-25 NOTE — ED Provider Notes (Signed)
CSN: [id number]     Arrival date & time 08/25/15  [id number] History   First MD Initiated Contact with Patient 08/25/15 1905     Chief Complaint  Patient presents with  . Rectal Bleeding    POSITIVE OCCULT EXAM FROM HER FACILITY     (Consider location/radiation/quality/duration/timing/severity/associated sxs/prior Treatment) HPI   Sarah Mckay is a 60 y.o. female, with a history of DM and dementia, presenting to the ED with rectal bleeding that began this afternoon. Pt states she had a bowel movement and had a large amount of blood in the toilet along with a large clot. Pt states this has happened before, but it has been years. Pt denies abdominal pain, shortness of breath, chest pain, nausea/vomiting, fever/chills, or any other complaints.     Past Medical History  Diagnosis Date  . Seizures (HCC)   . Alcohol abuse   . Diabetes mellitus without complication (HCC)   . Dementia with behavioral problem   . Arthritis   . Dysphagia     and aspiration risk  . 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 No pertinent past surgical history. History reviewed. No pertinent family history. Social History  Substance Use Topics  . Smoking status: Current Every Day Smoker -- 0.50 packs/day for 15 years    Types: Cigarettes  . Smokeless tobacco: None  . Alcohol Use: 1.2 oz/week    2 Cans of beer per week     Comment: occ   OB History    No data available     Review of Systems  Constitutional: Negative for fever and chills.  Respiratory: Negative for shortness of breath.   Cardiovascular: Negative for chest pain.  Gastrointestinal: Positive for blood in stool. Negative for nausea, vomiting and abdominal pain.  Genitourinary: Negative for dysuria.  Skin: Negative for color change and pallor.  All other systems reviewed and are negative.     Allergies  Review of patient's allergies indicates no known allergies.  Home Medications   Prior to Admission medications    Medication Sig Start Date End Date Taking? Authorizing Provider  Amino Acids-Protein Hydrolys (FEEDING SUPPLEMENT, PRO-STAT SUGAR FREE 64,) LIQD Take 30 mLs by mouth 2 (two) times daily. 0800 and 1700   Yes Historical Provider, MD  cholecalciferol (VITAMIN D) 1000 UNITS tablet Take 1,000 Units by mouth daily.   Yes Historical Provider, MD  diclofenac sodium (VOLTAREN) 1 % GEL Apply 2 g topically 4 (four) times daily. To both knees 06/03/14  Yes Sharee Holstereborah S Green, NP  gabapentin (NEURONTIN) 300 MG capsule Take 3 capsules (900 mg total) by mouth 3 (three) times daily. 06/30/14  Yes Sharee Holstereborah S Green, NP  metFORMIN (GLUCOPHAGE-XR) 500 MG 24 hr tablet Take 500 mg by mouth daily with breakfast.   Yes Historical Provider, MD  Multiple Vitamin (MULTIVITAMIN WITH MINERALS) TABS tablet Take 2 tablets by mouth at bedtime.    Yes Historical Provider, MD  OLANZapine (ZYPREXA) 7.5 MG tablet Take 7.5 mg by mouth at bedtime.   Yes Historical Provider, MD  rivaroxaban (XARELTO) 20 MG TABS tablet Take 20 mg by mouth every morning.    Yes Historical Provider, MD  thiamine 100 MG tablet Take 100 mg by mouth daily.   Yes Historical Provider, MD  traMADol (ULTRAM) 50 MG tablet Take one tablet by mouth three times daily for pain; Take one tablet by mouth once daily as needed for pain. Max 400mg  Tramadol/day Patient taking differently: Take 50 mg by mouth 3 (  three) times daily.  12/28/14  Yes Tiffany L Reed, DO  LORazepam (ATIVAN) 0.5 MG tablet Take 0.5 mg by mouth every 8 (eight) hours as needed for anxiety.    Historical Provider, MD  Melatonin 3 MG TABS Take 3 mg by mouth at bedtime as needed (sleep).     Historical Provider, MD   BP 148/106 mmHg  Pulse 117  Temp(Src) 99.4 F (37.4 C) (Rectal)  Resp 18  Ht  (1.676 m)  Wt 61.236 kg  BMI 21.80 kg/m2  SpO2 98%  LMP 03/25/2015 (Approximate) Physical Exam  Constitutional: She appears well-developed and well-nourished. No distress.  HENT:  Head: Normocephalic and  atraumatic.  Eyes: Conjunctivae are normal. Pupils are equal, round, and reactive to light.  Neck: Neck supple.  Cardiovascular: Regular rhythm, normal heart sounds and intact distal pulses.  Tachycardia present.   Pulmonary/Chest: Effort normal and breath sounds normal. No respiratory distress.  Abdominal: Soft. She exhibits distension. There is no tenderness. There is no guarding.  Genitourinary: Guaiac positive stool.  Med tech served as chaperone during rectal exam. Streaks of red blood and mucous along with a stool burden in the rectal vault.   Musculoskeletal: She exhibits no edema or tenderness.  Lymphadenopathy:    She has no cervical adenopathy.  Neurological: She is alert.  Skin: Skin is warm and dry. She is not diaphoretic.  Psychiatric: She has a normal mood and affect. Her behavior is normal.  Nursing note and vitals reviewed.   ED Course  Procedures (including critical care time) Labs Review Labs Reviewed  CBC WITH DIFFERENTIAL/PLATELET - Abnormal; Notable for the following:    WBC 12.8 (*)    Hemoglobin 11.9 (*)    All other components within normal limits  APTT - Abnormal; Notable for the following:    aPTT 41 (*)    All other components within normal limits  PROTIME-INR - Abnormal; Notable for the following:    Prothrombin Time 17.4 (*)    All other components within normal limits  HEMOGLOBIN AND HEMATOCRIT, BLOOD - Abnormal; Notable for the following:    Hemoglobin 11.8 (*)    HCT 34.3 (*)    All other components within normal limits  URINALYSIS, ROUTINE W REFLEX MICROSCOPIC (NOT AT [facility]) - Abnormal; Notable for the following:    Specific Gravity, Urine 1.034 (*)    Hgb urine dipstick MODERATE (*)    Nitrite POSITIVE (*)    Leukocytes, UA SMALL (*)    All other components within normal limits  URINE MICROSCOPIC-ADD ON - Abnormal; Notable for the following:    Squamous Epithelial / LPF 0-5 (*)    Bacteria, UA MANY (*)    All other components within normal  limits  POC OCCULT BLOOD, ED - Abnormal; Notable for the following:    Fecal Occult Bld POSITIVE (*)    All other components within normal limits  CBG MONITORING, ED - Abnormal; Notable for the following:    Glucose-Capillary 168 (*)    All other components within normal limits  I-STAT CG4 LACTIC ACID, ED - Abnormal; Notable for the following:    Lactic Acid, Venous 3.33 (*)    All other components within normal limits  URINE CULTURE  COMPREHENSIVE METABOLIC PANEL  LIPASE, BLOOD  HEMOGLOBIN AND HEMATOCRIT, BLOOD    Imaging Review Ct Abdomen Pelvis W Contrast  08/25/2015  CLINICAL DATA:  60 year old female with rectal bleeding EXAM: CT ABDOMEN AND PELVIS WITH CONTRAST TECHNIQUE: Multidetector CT imaging of the  abdomen and pelvis was performed using the standard protocol following bolus administration of intravenous contrast. CONTRAST:  ISOVUE-300 IOPAMIDOL (ISOVUE-300) INJECTION 61% COMPARISON:  None. FINDINGS: The visualized lung bases are clear. Caps no intra-abdominal free air or free fluid. There multiple stones within the gallbladder. No pericholecystic fluid. Ultrasound may provide better evaluation of gallbladder. The liver appears unremarkable. There is atrophy of the pancreas with dilatation of the main pancreatic duct. Innumerable coarse calcification of the pancreas noted findings sequela of chronic pancreatitis. No definite peripancreatic stranding identified to suggest acute pancreatitis. Correlation with pancreatic enzymes recommended. The liver spleen, adrenal glands, kidneys, visualized ureters appear unremarkable. There is diffusely thickened and trabecular appearance of the urinary bladder likely related to chronic cystitis or chronic bladder outlet obstruction. Correlation with urinalysis recommended to exclude superimposed acute the TI. Underlying infiltrative lesion is less likely. Cystoscopy may provide additional information if clinically indicated. The uterus is  anteverted and grossly unremarkable. Copious amount of dense stool noted throughout the colon. There is no evidence of bowel obstruction or active inflammation. Normal appendix. The abdominal aorta and IVC appear unremarkable. No portal venous gas identified. There is a circumaortic left renal vein anatomy. There is no adenopathy. Midline vertical anterior pelvic wall incisional scar. There is diastases of anterior abdominal wall musculature at the level of the umbilicus. The abdominal wall soft tissues are otherwise unremarkable. There is osteopenia with degenerative changes of the spine. 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 No acute fracture. IMPRESSION: Constipation. No evidence of bowel obstruction or inflammation. Normal appendix. Cholelithiasis. Changes of chronic pancreatitis. Correlation with pancreatic enzymes recommended to exclude acute pancreatitis. Diffusely thickened and trabecular appearance of the bladder likely related to chronic bladder outlet obstruction or chronic UTI. Electronically Signed   By: Elgie Collard M.D.   On: 08/25/2015 23:28   I have personally reviewed and evaluated these images and lab results as part of my medical decision-making.   EKG Interpretation None      MDM   Final diagnoses:  Rectal bleeding    Darilyn Storbeck presents with rectal bleeding that occurred earlier today.  Findings and plan of care discussed with Laurence Spates, MD and then with Azalia Bilis, MD upon EDP shift change.   Patient's only abnormalities on exam are positive Hemoccult and abdominal distention. CT ordered due to the abdominal distention. Pt reassessed multiple times with no changes in the patient's presentation. Pt remains pain-free. Only small streaks of blood noted in patient's diaper. No drop in hemoglobin while here in the ED. Due to recommendations made based on CT findings combined with the patient's sustained tachycardia, a lipase and UA were  ordered. UA was not done initially due to the patient's lack of urinary symptoms.  UA shows evidence of UTI. Unclear at this point if the patient's abnormal vital signs are from the GI bleed or from the UTI. Pt will need to be admitted at least for observation and trended H&Hs.  2:12 AM Spoke with Dr. Katrinka Blazing, Hospitalist, who agreed to admit the patient to telemetry. No further instructions. There is a third H&H pending upon the patient's admission.   Orthostatic VS for the past 24 hrs:  BP- Lying Pulse- Lying BP- Sitting Pulse- Sitting BP- Standing at 0 minutes  08/26/15 0211 (!) 131/92 mmHg 115 137/89 mmHg 118 -     Filed Vitals:   08/25/15 1849 08/25/15 1851 08/25/15 2050 08/25/15 2305  BP:  124/76 159/95 149/87  Pulse:  110 112 115  Temp:  98.4 F (36.9 C)    TempSrc:  Oral    Resp:  14 17 22   Height: 5\' 6"  (1.676 m)     Weight: 61.236 kg     SpO2:  97% 100% 100%   Filed Vitals:   08/25/15 2050 08/25/15 2305 08/26/15 0023 08/26/15 0205  BP: 159/95 149/87 109/81 148/106  Pulse: 112 115 121 117  Temp:   99.4 F (37.4 C)   TempSrc:   Rectal   Resp: 17 22 20 18   Height:      Weight:      SpO2: 100% 100% 99% 98%      Anselm Pancoast, PA-C 08/26/15 0214  Laurence Spates, MD 08/26/15 [phone]

## 2015-08-25 NOTE — ED Notes (Signed)
Patient transported to CT 

## 2015-08-26 ENCOUNTER — Inpatient Hospital Stay (HOSPITAL_COMMUNITY): Payer: Medicaid Other

## 2015-08-26 DIAGNOSIS — K625 Hemorrhage of anus and rectum: Secondary | ICD-10-CM | POA: Insufficient documentation

## 2015-08-26 DIAGNOSIS — Z7984 Long term (current) use of oral hypoglycemic drugs: Secondary | ICD-10-CM | POA: Diagnosis not present

## 2015-08-26 DIAGNOSIS — F419 Anxiety disorder, unspecified: Secondary | ICD-10-CM | POA: Diagnosis present

## 2015-08-26 DIAGNOSIS — N390 Urinary tract infection, site not specified: Secondary | ICD-10-CM | POA: Diagnosis present

## 2015-08-26 DIAGNOSIS — M79605 Pain in left leg: Secondary | ICD-10-CM | POA: Diagnosis present

## 2015-08-26 DIAGNOSIS — R140 Abdominal distension (gaseous): Secondary | ICD-10-CM | POA: Diagnosis present

## 2015-08-26 DIAGNOSIS — W19XXXA Unspecified fall, initial encounter: Secondary | ICD-10-CM | POA: Diagnosis present

## 2015-08-26 DIAGNOSIS — E1140 Type 2 diabetes mellitus with diabetic neuropathy, unspecified: Secondary | ICD-10-CM | POA: Diagnosis present

## 2015-08-26 DIAGNOSIS — F0391 Unspecified dementia with behavioral disturbance: Secondary | ICD-10-CM | POA: Diagnosis present

## 2015-08-26 DIAGNOSIS — Z86718 Personal history of other venous thrombosis and embolism: Secondary | ICD-10-CM

## 2015-08-26 DIAGNOSIS — N3000 Acute cystitis without hematuria: Secondary | ICD-10-CM | POA: Diagnosis not present

## 2015-08-26 DIAGNOSIS — D649 Anemia, unspecified: Secondary | ICD-10-CM | POA: Diagnosis present

## 2015-08-26 DIAGNOSIS — R000 Tachycardia, unspecified: Secondary | ICD-10-CM | POA: Diagnosis present

## 2015-08-26 DIAGNOSIS — G8929 Other chronic pain: Secondary | ICD-10-CM | POA: Diagnosis present

## 2015-08-26 DIAGNOSIS — K5909 Other constipation: Secondary | ICD-10-CM

## 2015-08-26 DIAGNOSIS — F17210 Nicotine dependence, cigarettes, uncomplicated: Secondary | ICD-10-CM | POA: Diagnosis present

## 2015-08-26 DIAGNOSIS — K5900 Constipation, unspecified: Secondary | ICD-10-CM | POA: Diagnosis present

## 2015-08-26 DIAGNOSIS — Z79899 Other long term (current) drug therapy: Secondary | ICD-10-CM | POA: Diagnosis not present

## 2015-08-26 DIAGNOSIS — A419 Sepsis, unspecified organism: Secondary | ICD-10-CM | POA: Diagnosis not present

## 2015-08-26 DIAGNOSIS — K921 Melena: Secondary | ICD-10-CM | POA: Diagnosis present

## 2015-08-26 DIAGNOSIS — G40909 Epilepsy, unspecified, not intractable, without status epilepticus: Secondary | ICD-10-CM | POA: Diagnosis present

## 2015-08-26 DIAGNOSIS — M170 Bilateral primary osteoarthritis of knee: Secondary | ICD-10-CM | POA: Diagnosis present

## 2015-08-26 DIAGNOSIS — Z7901 Long term (current) use of anticoagulants: Secondary | ICD-10-CM | POA: Diagnosis not present

## 2015-08-26 HISTORY — DX: Other constipation: K59.09

## 2015-08-26 LAB — LACTIC ACID, PLASMA
LACTIC ACID, VENOUS: 2.2 mmol/L — AB (ref 0.5–2.0)
LACTIC ACID, VENOUS: 3.2 mmol/L — AB (ref 0.5–2.0)

## 2015-08-26 LAB — URINALYSIS, ROUTINE W REFLEX MICROSCOPIC
Bilirubin Urine: NEGATIVE
GLUCOSE, UA: NEGATIVE mg/dL
Ketones, ur: NEGATIVE mg/dL
Nitrite: POSITIVE — AB
PH: 5.5 (ref 5.0–8.0)
Protein, ur: NEGATIVE mg/dL
SPECIFIC GRAVITY, URINE: 1.034 — AB (ref 1.005–1.030)

## 2015-08-26 LAB — URINE MICROSCOPIC-ADD ON

## 2015-08-26 LAB — BASIC METABOLIC PANEL
ANION GAP: 8 (ref 5–15)
BUN: 13 mg/dL (ref 6–20)
CHLORIDE: 111 mmol/L (ref 101–111)
CO2: 25 mmol/L (ref 22–32)
CREATININE: 0.65 mg/dL (ref 0.44–1.00)
Calcium: 9.2 mg/dL (ref 8.9–10.3)
GFR calc non Af Amer: >60 mL/min (ref >60)
Glucose, Bld: 141 mg/dL — ABNORMAL HIGH (ref 65–99)
Potassium: 4.1 mmol/L (ref 3.5–5.1)
Sodium: 144 mmol/L (ref 135–145)

## 2015-08-26 LAB — GLUCOSE, CAPILLARY
Glucose-Capillary: 165 mg/dL — ABNORMAL HIGH (ref 65–99)
Glucose-Capillary: 120 mg/dL — ABNORMAL HIGH (ref 65–99)
Glucose-Capillary: 155 mg/dL — ABNORMAL HIGH (ref 65–99)
Glucose-Capillary: 126 mg/dL — ABNORMAL HIGH (ref 65–99)

## 2015-08-26 LAB — MRSA PCR SCREENING: MRSA by PCR: NEGATIVE

## 2015-08-26 LAB — HEMOGLOBIN AND HEMATOCRIT, BLOOD
HCT: 34.7 % — ABNORMAL LOW (ref 36.0–46.0)
HEMATOCRIT: 34 % — AB (ref 36.0–46.0)
HEMOGLOBIN: 11.7 g/dL — AB (ref 12.0–15.0)
HEMOGLOBIN: 11.1 g/dL — AB (ref 12.0–15.0)

## 2015-08-26 LAB — I-STAT CG4 LACTIC ACID, ED: Lactic Acid, Venous: 3.33 mmol/L (ref 0.5–2.0)

## 2015-08-26 LAB — LIPASE, BLOOD: Lipase: 19 U/L (ref 11–51)

## 2015-08-26 MED ORDER — VITAMIN D3 25 MCG (1000 UNIT) PO TABS
1000.0000 [IU] | ORAL_TABLET | Freq: Every day | ORAL | Status: DC
  Administered 2015-08-26 – 2015-08-28 (×3): 1000 [IU] via ORAL
  Filled 2015-08-26 (×3): qty 1

## 2015-08-26 MED ORDER — OLANZAPINE 7.5 MG PO TABS
7.5000 mg | ORAL_TABLET | Freq: Every day | ORAL | Status: DC
  Filled 2015-08-26 (×2): qty 1

## 2015-08-26 MED ORDER — ONDANSETRON HCL 4 MG PO TABS: 4.0000 mg | ORAL_TABLET | Freq: Four times a day (QID) | ORAL | Status: DC | PRN

## 2015-08-26 MED ORDER — INSULIN ASPART 100 UNIT/ML ~~LOC~~ SOLN
0.0000 [IU] | Freq: Three times a day (TID) | SUBCUTANEOUS | Status: DC
  Administered 2015-08-26 (×2): 2 [IU] via SUBCUTANEOUS
  Administered 2015-08-27: 1 [IU] via SUBCUTANEOUS
  Administered 2015-08-28 (×2): 2 [IU] via SUBCUTANEOUS

## 2015-08-26 MED ORDER — ACETAMINOPHEN 650 MG RE SUPP: 650.0000 mg | Freq: Four times a day (QID) | RECTAL | Status: DC | PRN

## 2015-08-26 MED ORDER — PRO-STAT SUGAR FREE PO LIQD
30.0000 mL | Freq: Two times a day (BID) | ORAL | Status: DC
  Administered 2015-08-26 – 2015-08-28 (×3): 30 mL via ORAL
  Filled 2015-08-26 (×5): qty 30

## 2015-08-26 MED ORDER — ALBUTEROL SULFATE (2.5 MG/3ML) 0.083% IN NEBU: 2.5000 mg | INHALATION_SOLUTION | RESPIRATORY_TRACT | Status: DC | PRN

## 2015-08-26 MED ORDER — HALOPERIDOL LACTATE 5 MG/ML IJ SOLN
2.5000 mg | INTRAMUSCULAR | Status: AC | PRN
  Administered 2015-08-26 (×2): 2.5 mg via INTRAMUSCULAR
  Filled 2015-08-26 (×2): qty 1

## 2015-08-26 MED ORDER — GABAPENTIN 300 MG PO CAPS
900.0000 mg | ORAL_CAPSULE | Freq: Three times a day (TID) | ORAL | Status: DC
  Administered 2015-08-26 – 2015-08-28 (×5): 900 mg via ORAL
  Filled 2015-08-26 (×8): qty 3

## 2015-08-26 MED ORDER — MELATONIN 3 MG PO TABS: 3.0000 mg | ORAL_TABLET | Freq: Every evening | ORAL | Status: DC | PRN

## 2015-08-26 MED ORDER — TRAMADOL HCL 50 MG PO TABS
50.0000 mg | ORAL_TABLET | Freq: Three times a day (TID) | ORAL | Status: DC | PRN
  Administered 2015-08-26 – 2015-08-28 (×4): 50 mg via ORAL
  Filled 2015-08-26 (×6): qty 1

## 2015-08-26 MED ORDER — VITAMIN B-1 100 MG PO TABS
100.0000 mg | ORAL_TABLET | Freq: Every day | ORAL | Status: DC
  Administered 2015-08-26 – 2015-08-28 (×3): 100 mg via ORAL
  Filled 2015-08-26 (×3): qty 1

## 2015-08-26 MED ORDER — SODIUM CHLORIDE 0.9 % IV BOLUS (SEPSIS): 1000.0000 mL | Freq: Once | INTRAVENOUS | Status: DC

## 2015-08-26 MED ORDER — DICLOFENAC SODIUM 1 % TD GEL
2.0000 g | Freq: Four times a day (QID) | TRANSDERMAL | Status: DC
Start: 2015-08-26 — End: 2015-08-28
  Administered 2015-08-26 – 2015-08-28 (×10): 2 g via TOPICAL
  Filled 2015-08-26: qty 100

## 2015-08-26 MED ORDER — ACETAMINOPHEN 325 MG PO TABS: 650.0000 mg | ORAL_TABLET | Freq: Four times a day (QID) | ORAL | Status: DC | PRN

## 2015-08-26 MED ORDER — CEFTRIAXONE SODIUM 1 G IJ SOLR
1.0000 g | Freq: Once | INTRAMUSCULAR | Status: AC
  Administered 2015-08-26: 1 g via INTRAVENOUS
  Filled 2015-08-26: qty 10

## 2015-08-26 MED ORDER — SODIUM CHLORIDE 0.9 % IV SOLN
INTRAVENOUS | Status: DC
  Administered 2015-08-26 (×2): via INTRAVENOUS

## 2015-08-26 MED ORDER — LORAZEPAM 0.5 MG PO TABS
0.5000 mg | ORAL_TABLET | Freq: Three times a day (TID) | ORAL | Status: DC | PRN
Start: 2015-08-26 — End: 2015-08-28
  Administered 2015-08-27 – 2015-08-28 (×3): 0.5 mg via ORAL
  Filled 2015-08-26 (×5): qty 1

## 2015-08-26 MED ORDER — ONDANSETRON HCL 4 MG/2ML IJ SOLN: 4.0000 mg | Freq: Four times a day (QID) | INTRAMUSCULAR | Status: DC | PRN

## 2015-08-26 MED ORDER — SODIUM CHLORIDE 0.9% FLUSH
3.0000 mL | Freq: Two times a day (BID) | INTRAVENOUS | Status: DC
  Administered 2015-08-26 – 2015-08-28 (×4): 3 mL via INTRAVENOUS

## 2015-08-26 MED ORDER — ADULT MULTIVITAMIN W/MINERALS CH
2.0000 | ORAL_TABLET | Freq: Every day | ORAL | Status: DC
  Filled 2015-08-26 (×2): qty 2

## 2015-08-26 MED ORDER — ACETAMINOPHEN 500 MG PO TABS
1000.0000 mg | ORAL_TABLET | Freq: Once | ORAL | Status: AC
  Administered 2015-08-26: 1000 mg via ORAL
  Filled 2015-08-26: qty 2

## 2015-08-26 MED ORDER — DEXTROSE 5 % IV SOLN
1.0000 g | INTRAVENOUS | Status: DC
  Administered 2015-08-26 – 2015-08-27 (×2): 1 g via INTRAVENOUS
  Filled 2015-08-26 (×3): qty 10

## 2015-08-26 MED ORDER — ONDANSETRON HCL 4 MG/2ML IJ SOLN
4.0000 mg | Freq: Three times a day (TID) | INTRAMUSCULAR | Status: DC | PRN
Start: 2015-08-26 — End: 2015-08-26

## 2015-08-26 NOTE — Progress Notes (Signed)
CRITICAL VALUE ALERT  Critical value received:  Lactic Acid 2.2  Date of notification:  08/26/15  Time of notification:  7:40  Critical value read back:Yes.    Nurse who received alert:  Catie Lendell CapriceSullivan  MD notified (1st page):  Elmahi  Time of first page:  0928  MD notified (2nd page):  Time of second page:  Responding MD:  Dr.Elmahi  Time MD responded:  [phone]

## 2015-08-26 NOTE — Progress Notes (Signed)
PT Cancellation Note  Patient Details Name: Sarah OxfordFrieda Evett MRN: [id number] DOB: 10/28/1955   Cancelled Treatment:    Reason Eval/Treat Not Completed: Other (comment) (up in recliner by nursing. Is a long term resident of SNF. check back in the AM.)   Rada HayHill, Daurice Ovando Elizabeth 08/26/2015, 4:06 PM

## 2015-08-26 NOTE — Care Management Note (Signed)
Case Management Note  Patient Details  Name: Sarah Mckay MRN: [id number] Date of Birth: 10/07/1955  Subjective/Objective:59 y/o f admitted w/Bloody stools. From SNF[facility].CSW already following for return.                    Action/Plan:d/c plan return back to SNF-[facility].   Expected Discharge Date:                  Expected Discharge Plan:  Skilled Nursing Facility  In-House Referral:  Clinical Social Work  Discharge planning Services  CM Consult  Post Acute Care Choice:    Choice offered to:     DME Arranged:    DME Agency:     HH Arranged:    HH Agency:     Status of Service:  In process, will continue to follow  Medicare Important Message Given:    Date Medicare IM Given:    Medicare IM give by:    Date Additional Medicare IM Given:    Additional Medicare Important Message give by:     If discussed at Long Length of Stay Meetings, dates discussed:    Additional Comments:  Lanier ClamMahabir, Ciji Boston, RN 08/26/2015, 1:22 PM

## 2015-08-26 NOTE — NC FL2 (Signed)
[place] MEDICAID FL2 LEVEL OF CARE SCREENING TOOL     IDENTIFICATION  Patient Name: Sarah OxfordFrieda Bogacz Birthdate: 05/08/1956 Sex: female Admission Date (Current Location): 08/25/2015  [place]  [place] [id number]  Facility and Address:  [facility],  [address], [place] [id number]      Provider Number: [id number]  Attending Physician Name and Address:  Clydia LlanoMutaz Elmahi, MD  Relative Name and Phone Number:       Current Level of Care: Hospital Recommended Level of Care: Skilled Nursing Facility Prior Approval Number:    Date Approved/Denied:   PASRR Number: [id number]  Discharge Plan: SNF    Current Diagnoses: Patient Active Problem List   Diagnosis Date Noted  . Rectal bleed 08/26/2015  . Rectal bleeding 08/26/2015  . History of DVT (deep vein thrombosis) 08/26/2015  . Constipation 08/26/2015  . Anemia 08/26/2015  . Infection of urinary tract 08/26/2015  . Melena 08/25/2015  . FTT (failure to thrive) in adult 07/05/2015  . Insomnia 03/25/2015  . Polyneuropathy due to type 2 diabetes mellitus (HCC) 03/25/2015  . Acute deep vein thrombosis (DVT) of left femoral vein (HCC) 02/22/2015  . Diabetes mellitus with neurological manifestations (HCC) 10/22/2014  . 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 Primary osteoarthritis of both knees 06/03/2014  . Chronic pain 05/13/2014  . Alcohol abuse   . Dementia with behavioral problem   . Dysphagia   . 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)     Orientation RESPIRATION BLADDER Height & Weight     Self, Time  Normal Continent Weight: 153 lb 3.2 oz (69.491 kg) Height:  5\' 6"  (167.6 cm)  BEHAVIORAL SYMPTOMS/MOOD NEUROLOGICAL BOWEL NUTRITION STATUS      Continent Diet (Carb Modified)  AMBULATORY STATUS COMMUNICATION OF NEEDS Skin   Extensive Assist Verbally Normal                       Personal Care Assistance Level of Assistance  Bathing, Dressing Bathing Assistance:  Limited assistance   Dressing Assistance: Limited assistance     Functional Limitations Info             SPECIAL CARE FACTORS FREQUENCY                       Contractures      Additional Factors Info  Code Status, Allergies Code Status Info: Fullcode Allergies Info: NKDA           Current Medications (08/26/2015):  This is the current hospital active medication list Current Facility-Administered Medications  Medication Dose Route Frequency Provider Last Rate Last Dose  . 0.9 %  sodium chloride infusion   Intravenous Continuous Clydie Braunondell A Smith, MD 75 mL/hr at 08/26/15 0416    . acetaminophen (TYLENOL) tablet 650 mg  650 mg Oral Q6H PRN Clydie Braunondell A Smith, MD       Or  . acetaminophen (TYLENOL) suppository 650 mg  650 mg Rectal Q6H PRN Rondell A Katrinka BlazingSmith, MD      . albuterol (PROVENTIL) (2.5 MG/3ML) 0.083% nebulizer solution 2.5 mg  2.5 mg Nebulization Q2H PRN Rondell A Katrinka BlazingSmith, MD      . cefTRIAXone (ROCEPHIN) 1 g in dextrose 5 % 50 mL IVPB  1 g Intravenous Q24H Calvert CantorSaima Rizwan, MD      . cholecalciferol (VITAMIN D) tablet 1,000 Units  1,000 Units Oral Daily Clydie Braunondell A Smith, MD      . diclofenac  sodium (VOLTAREN) 1 % transdermal gel 2 g  2 g Topical QID Rondell A Smith, MD      . feeding supplement (PRO-STAT SUGAR FREE 64) liquid 30 mL  30 mL Oral BID Rondell A Katrinka Blazing, MD      . gabapentin (NEURONTIN) capsule 900 mg  900 mg Oral TID Rondell A Katrinka Blazing, MD      . insulin aspart (novoLOG) injection 0-9 Units  0-9 Units Subcutaneous TID WC Rondell A Katrinka Blazing, MD      . LORazepam (ATIVAN) tablet 0.5 mg  0.5 mg Oral Q8H PRN Clydie Braun, MD      . multivitamin with minerals tablet 2 tablet  2 tablet Oral QHS Rondell A Smith, MD      . OLANZapine (ZYPREXA) tablet 7.5 mg  7.5 mg Oral QHS Rondell A Katrinka Blazing, MD      . ondansetron (ZOFRAN) tablet 4 mg  4 mg Oral Q6H PRN Clydie Braun, MD       Or  . ondansetron (ZOFRAN) injection 4 mg  4 mg Intravenous Q6H PRN Rondell A Smith, MD      .  sodium chloride 0.9 % bolus 1,000 mL  1,000 mL Intravenous Once Shawn C Joy, PA-C   Stopped at 08/26/15 0032  . sodium chloride flush (NS) 0.9 % injection 3 mL  3 mL Intravenous Q12H Clydie Braun, MD   3 mL at 08/26/15 0315  . thiamine (VITAMIN B-1) tablet 100 mg  100 mg Oral Daily Rondell A Katrinka Blazing, MD      . traMADol (ULTRAM) tablet 50 mg  50 mg Oral Q8H PRN Clydie Braun, MD         Discharge Medications: Please see discharge summary for a list of discharge medications.  Relevant Imaging Results:  Relevant Lab Results:   Additional Information SSN: [id number]  Arlyss Repress, LCSW

## 2015-08-26 NOTE — Clinical Social Work Note (Signed)
Clinical Social Work Assessment  Patient Details  Name: Sarah OxfordFrieda Mckay MRN: [id number] Date of Birth: 07/22/1955  Date of referral:  08/26/15               Reason for consult:  Facility Placement                Permission sought to share information with:  Facility Industrial/product designerContact Representative Permission granted to share information::  Yes, Verbal Permission Granted  Name::        Agency::     Relationship::     Contact Information:     Housing/Transportation Living arrangements for the past 2 months:  Skilled Nursing Facility Source of Information:  Other (Comment Required) (patient's brother, Berna SpareMarcus) Patient Interpreter Needed:  None Criminal Activity/Legal Involvement Pertinent to Current Situation/Hospitalization:  No - Comment as needed Significant Relationships:  Siblings Lives with:  Facility Resident Do you feel safe going back to the place where you live?  Yes Need for family participation in patient care:  Yes (Comment)  Care giving concerns:  CSW received consult that patient was admitted from [facility].    Social Worker assessment / plan:  CSW spoke with patient's brother, Berna SpareMarcus (ph#: [phone]) to confirm that patient will be returning to [facility] when discharged from the hospital.   Employment status:  Retired Health and safety inspectornsurance information:  [place]  Skilled Nursing Facility Information / Referral to community resources:  Skilled Nursing Facility  Patient/Family's Response to care:  Patient's brother states that he has been very pleased with the care she has been receiving at [facility] & that she has been there the past year.   Patient/Family's Understanding of and Emotional Response to Diagnosis, Current Treatment, and Prognosis:    Emotional Assessment Appearance:  Appears stated age Attitude/Demeanor/Rapport:    Affect (typically observed):    Orientation:  Oriented to Self Alcohol / Substance use:    Psych  involvement (Current and /or in the community):     Discharge Needs  Concerns to be addressed:    Readmission within the last 30 days:    Current discharge risk:    Barriers to Discharge:      Arlyss RepressHarrison, Momin Misko F, LCSW 08/26/2015, 1:05 PM

## 2015-08-26 NOTE — Progress Notes (Signed)
[facility] PROGRESS NOTE   Sarah Mckay ZOX:[id number] DOB: 01-Apr-1956 DOA: 08/25/2015 PCP: Margit Hanks, MD  HPI/Subjective: Denies any complaints, feels okay. She lives in [facility] This is a no charge note, patient seen earlier today by my colleague Dr. Katrinka Blazing. I have seen and examined the patient and review the database.  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 Patient with reports of one large bloody bowel movement yesterday morning. Found to be guaiac positive on presentation. Hemoglobin initially 11.9 on presentation and has been relatively stable with 2 blood draws. Suspect possibility of a bleeding hemorrhoid versus anal bleed secondary to history of constipation. -Currently no evidence of bleeding, likely self-limiting hemorrhoidal secondary to constipation. -If bleeding recurs might consider calling GI.  Sepsis secondary to urinary tract infection: WBC elevated at 12.8, lactic acid 3.33, heart rate 121, and positive urinalysis on admission. -Started on Rocephin in the ED, elevated lactic acid repeated in a.m.  Anemia: Hemoglobin appears stable at 11.8 - Continue to monitor  Constipation: Acute on chronic. Patient notes bouts with constipation. Physical exam reveals a mildly distended abdomen. CT scan showing moderate constipation with no signs of obstruction. - Would recommend placing patient on a bowel regimen once presenting problem is resolved.  Tachycardia: Heart rates acutely elevated at 121 on admission. Patient denies any chest pain symptoms or shortness of breath. - Check 12-lead EKG   Left leg pain and swelling: In lieu of the patient recently reporting a fall - Check xray of the left knee  Diabetes  mellitus type 2 with neuropathy - Metformin on hold - Continue gabapentin - CBGs every before meals with a sensitive sliding scale insulin for now  History of DVT of the left femoral vein: It appears upon review of previous records that the physician at the nursing facility he was considering stopping therapy as DVT occurred in 2015. - Xarelto on hold, restart when able to verify patient not actively bleeding  History of psychosis - Continue olanzapine  Anxiety - Ativan prn anxiety  SCDs for DVT prophylaxis  Code Status: Full Code Family Communication: Plan discussed with the patient. Disposition Plan: Remains inpatient Diet: Diet Carb Modified Fluid consistency:: Thin; Room service appropriate?: Yes  Consultants:  None  Procedures:  None  Antibiotics:  Rocephin (indicate start date, and stop date if known)   Objective: Filed Vitals:   08/26/15 0340 08/26/15 0546  BP: 124/71 133/74  Pulse: 105 115  Temp: 98.1 F (36.7 C) 98.3 F (36.8 C)  Resp: 18 16    Intake/Output Summary (Last 24 hours) at 08/26/15 1316 Last data filed at 08/26/15 0700  Gross per 24 hour  Intake    205 ml  Output      0 ml  Net    205 ml   Filed Weights   08/25/15 1849 08/26/15 0340  Weight: 61.236 kg (135 lb) 69.491 kg (153 lb 3.2 oz)    Exam: General: Alert and awake, oriented x3, not in any acute distress. HEENT: anicteric sclera, pupils reactive to light and accommodation, EOMI CVS: S1-S2 clear, no murmur rubs or gallops Chest: clear to auscultation bilaterally, no wheezing, rales or rhonchi Abdomen: soft nontender, nondistended, normal bowel sounds, no organomegaly Extremities: no cyanosis,  clubbing or edema noted bilaterally Neuro: Cranial nerves II-XII intact, no focal neurological deficits  Data Reviewed: Basic Metabolic Panel:  Recent Labs Lab 08/25/15 1941 08/26/15 0509  NA 142 144  K 4.3 4.1  CL 105 111  CO2 24 25  GLUCOSE 99 141*  BUN 20 13  CREATININE  0.62 0.65  CALCIUM 9.5 9.2   Liver Function Tests:  Recent Labs Lab 08/25/15 1941  AST 26  ALT 20  ALKPHOS 104  BILITOT 0.3  PROT 8.1  ALBUMIN 3.7    Recent Labs Lab 08/26/15 0509  LIPASE 19   No results for input(s): AMMONIA in the last 168 hours. CBC:  Recent Labs Lab 08/25/15 1941 08/25/15 2335 08/26/15 0205 08/26/15 0509  WBC 12.8*  --   --   --   NEUTROABS 7.6  --   --   --   HGB 11.9* 11.8* 11.7* 11.1*  HCT 36.5 34.3* 34.7* 34.0*  MCV 82.4  --   --   --   PLT 299  --   --   --    Cardiac Enzymes: No results for input(s): CKTOTAL, CKMB, CKMBINDEX, TROPONINI in the last 168 hours. BNP (last 3 results) No results for input(s): BNP in the last 8760 hours.  ProBNP (last 3 results) No results for input(s): PROBNP in the last 8760 hours.  CBG:  Recent Labs Lab 08/25/15 1904 08/26/15 0723 08/26/15 1149  GLUCAP 168* 165* 120*    Micro Recent Results (from the past 240 hour(s))  MRSA PCR Screening     Status: None   Collection Time: 08/26/15  4:18 AM  Result Value Ref Range Status   MRSA by PCR NEGATIVE NEGATIVE Final    Comment:        The GeneXpert MRSA Assay (FDA approved for NASAL specimens only), is one component of a comprehensive MRSA colonization surveillance program. It is not intended to diagnose MRSA infection nor to guide or monitor treatment for MRSA infections.      Studies: Ct Abdomen Pelvis W Contrast  08/25/2015  CLINICAL DATA:  60 year old female with rectal bleeding EXAM: CT ABDOMEN AND PELVIS WITH CONTRAST TECHNIQUE: Multidetector CT imaging of the abdomen and pelvis was performed using the standard protocol following bolus administration of intravenous contrast. CONTRAST:  ISOVUE-300 IOPAMIDOL (ISOVUE-300) INJECTION 61% COMPARISON:  None. FINDINGS: The visualized lung bases are clear. Caps no intra-abdominal free air or free fluid. There multiple stones within the gallbladder. No pericholecystic fluid. Ultrasound may  provide better evaluation of gallbladder. The liver appears unremarkable. There is atrophy of the pancreas with dilatation of the main pancreatic duct. Innumerable coarse calcification of the pancreas noted findings sequela of chronic pancreatitis. No definite peripancreatic stranding identified to suggest acute pancreatitis. Correlation with pancreatic enzymes recommended. The liver spleen, adrenal glands, kidneys, visualized ureters appear unremarkable. There is diffusely thickened and trabecular appearance of the urinary bladder likely related to chronic cystitis or chronic bladder outlet obstruction. Correlation with urinalysis recommended to exclude superimposed acute the TI. Underlying infiltrative lesion is less likely. Cystoscopy may provide additional information if clinically indicated. The uterus is anteverted and grossly unremarkable. Copious amount of dense stool noted throughout the colon. There is no evidence of bowel obstruction or active inflammation. Normal appendix. The abdominal aorta and IVC appear unremarkable. No portal venous gas identified. There is a circumaortic left renal vein anatomy. There is no adenopathy. Midline vertical anterior pelvic wall incisional scar. There is diastases of anterior abdominal wall musculature at the  level of the umbilicus. The abdominal wall soft tissues are otherwise unremarkable. There is osteopenia with degenerative changes of the spine. 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 No acute fracture. IMPRESSION: Constipation. No evidence of bowel obstruction or inflammation. Normal appendix. Cholelithiasis. Changes of chronic pancreatitis. Correlation with pancreatic enzymes recommended to exclude acute pancreatitis. Diffusely thickened and trabecular appearance of the bladder likely related to chronic bladder outlet obstruction or chronic UTI. Electronically Signed   By: Elgie CollardArash  Radparvar M.D.   On: 08/25/2015 23:28   Dg Knee Left  Port  08/26/2015  CLINICAL DATA:  60 year old female with chronic knee pain. EXAM: PORTABLE LEFT KNEE - 1-2 VIEW COMPARISON:  Radiograph dated 08/04/2014 FINDINGS: There is no acute fracture or dislocation. The bones are osteopenic. There is narrowing of the medial and lateral compartments of the knee compatible with chronic osteoarthritic changes. Stable appearing sclerotic changes in the proximal tibia may be related to areas of infarct. There is no joint effusion. Mild subcutaneous soft tissue edema. IMPRESSION: No acute fracture or dislocation. Electronically Signed   By: Elgie CollardArash  Radparvar M.D.   On: 08/26/2015 03:38    Scheduled Meds: . cefTRIAXone (ROCEPHIN)  IV  1 g Intravenous Q24H  . cholecalciferol  1,000 Units Oral Daily  . diclofenac sodium  2 g Topical QID  . feeding supplement (PRO-STAT SUGAR FREE 64)  30 mL Oral BID  . gabapentin  900 mg Oral TID  . insulin aspart  0-9 Units Subcutaneous TID WC  . multivitamin with minerals  2 tablet Oral QHS  . OLANZapine  7.5 mg Oral QHS  . sodium chloride  1,000 mL Intravenous Once  . sodium chloride flush  3 mL Intravenous Q12H  . thiamine  100 mg Oral Daily   Continuous Infusions: . sodium chloride 75 mL/hr at 08/26/15 0416       Time spent: 35 minutes    [facility] Baptist HospitalELMAHI,Ammara Raj A  [facility] Pager [phone] If 7PM-7AM, please contact night-coverage at [email or web address], password [email or web address] 08/26/2015, 1:16 PM  LOS: 0 days

## 2015-08-26 NOTE — H&P (Addendum)
Sarah Mckay History and Physical  Sarah OxfordFrieda Mckay ZOX:[id number] DOB: 06/18/1955 DOA: 08/25/2015  Referring physician: ED PCP: Sarah Mckay   Chief Complaint: Bloody bowel movement  HPI:  Ms. Sarah Mckay is a 60 year old female with past medical history significant for diabetes mellitus type 2, dementia related to previous history of alcohol abuse, DVT recently stopped off anticoagulation, and seizure disorder; who presents with complaints of having a large bloody bowel movement. Symptoms occurred this afternoon she reports having a large clot found in the toilet bowl. Associated symptoms include reports of mild constipation. The patient is a long-term resident of [facility]. She denies having any shortness of breath, chest pain, abdominal pain, nausea, or vomiting. She reports recently falling on the left leg and reports pain there.  Upon admission patient was seen to have a heart rate of up to 121, temp 99.52F, respirations 22, O2 saturations 98% on room air. Lab work revealed WBC 12.8, hemoglobin 11.8, lactic acid 3.33, all other electrolytes within normal limits. UA positive for many bacteria, small leukocyte esterase, positive nitrites, and too numerous to count WBCs. CT scan showing signs of constipation with no significant bowel obstruction. Patient was found to be guaiac positive on examination. She is given IV Rocephin and Sarah Mckay were called to admit the patient for further workup.  Review of Systems  Constitutional: Negative for weight loss and malaise/fatigue.  HENT: Negative for hearing loss and tinnitus.   Eyes: Negative for photophobia.  Respiratory: Negative for cough and sputum production.   Cardiovascular: Positive for leg swelling. Negative for chest pain and palpitations.  Gastrointestinal: Positive for constipation and blood in stool. Negative for nausea, vomiting and abdominal pain.  Genitourinary: Negative for urgency and  frequency.  Musculoskeletal: Positive for back pain and joint pain.  Skin: Negative for itching and rash.  Neurological: Negative for seizures and loss of consciousness.  Endo/Heme/Allergies: Negative for environmental allergies and polydipsia. Bruises/bleeds easily.  Psychiatric/Behavioral: Negative for suicidal ideas. The patient is nervous/anxious.        Past Medical History  Diagnosis Date  . Seizures (HCC)   . Alcohol abuse   . Diabetes mellitus without complication (HCC)   . Dementia with behavioral problem   . Arthritis   . Dysphagia     and aspiration risk  . 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 No pertinent past surgical history.    Social History:  reports that she has been smoking Cigarettes.  She has a 7.5 pack-year smoking history. She does not have any smokeless tobacco history on file. She reports that she drinks about 1.2 oz of alcohol per week. She reports that she does not use illicit drugs. Where does patient live-- SNF  Can patient participate in ADLs? Needs assistance  No Known Allergies  History reviewed. No pertinent family history.      Prior to Admission medications   Medication Sig Start Date End Date Taking? Authorizing Provider  Amino Acids-Protein Hydrolys (FEEDING SUPPLEMENT, PRO-STAT SUGAR FREE 64,) LIQD Take 30 mLs by mouth 2 (two) times daily. 0800 and 1700   Yes Historical Provider, Mckay  cholecalciferol (VITAMIN Mckay) 1000 UNITS tablet Take 1,000 Units by mouth daily.   Yes Historical Provider, Mckay  diclofenac sodium (VOLTAREN) 1 % GEL Apply 2 g topically 4 (four) times daily. To both knees 06/03/14  Yes Sarah Mckay  gabapentin (NEURONTIN) 300 MG capsule Take 3 capsules (900 mg total) by mouth 3 (three)  times daily. 06/30/14  Yes Sarah Mckay  metFORMIN (GLUCOPHAGE-XR) 500 MG 24 hr tablet Take 500 mg by mouth daily with breakfast.   Yes Historical Provider, Mckay  Multiple Vitamin (MULTIVITAMIN WITH MINERALS) TABS  tablet Take 2 tablets by mouth at bedtime.    Yes Historical Provider, Mckay  OLANZapine (ZYPREXA) 7.5 MG tablet Take 7.5 mg by mouth at bedtime.   Yes Historical Provider, Mckay  rivaroxaban (XARELTO) 20 MG TABS tablet Take 20 mg by mouth every morning.    Yes Historical Provider, Mckay  thiamine 100 MG tablet Take 100 mg by mouth daily.   Yes Historical Provider, Mckay  traMADol (ULTRAM) 50 MG tablet Take one tablet by mouth three times daily for pain; Take one tablet by mouth once daily as needed for pain. Max 400mg  Tramadol/day Patient taking differently: Take 50 mg by mouth 3 (three) times daily.  12/28/14  Yes Sarah Mckay  LORazepam (ATIVAN) 0.5 MG tablet Take 0.5 mg by mouth every 8 (eight) hours as needed for anxiety.    Historical Provider, Mckay  Melatonin 3 MG TABS Take 3 mg by mouth at bedtime as needed (sleep).     Historical Provider, Mckay     Physical Exam: Filed Vitals:   08/25/15 2050 08/25/15 2305 08/26/15 0023 08/26/15 0205  BP: 159/95 149/87 109/81 148/106  Pulse: 112 115 121 117  Temp:   99.4 F (37.4 C)   TempSrc:   Rectal   Resp: 17 22 20 18   Height:      Weight:      SpO2: 100% 100% 99% 98%     Constitutional: Vital signs reviewed. Patient is a well-developed and well-nourished in no acute distress and cooperative with exam. Head: Normocephalic and atraumatic  Ear: TM normal bilaterally  Mouth: no erythema or exudates, MMM  Eyes: PERRL, EOMI, conjunctivae normal, No scleral icterus.  Neck: Supple, Trachea midline normal ROM, No JVD, mass, thyromegaly, or carotid bruit present.  Cardiovascular: Tachycardia Pulmonary/Chest: CTAB, no wheezes, rales, or rhonchi  Abdominal: Soft. Moderately distended, no rebound tenderness, bowel sounds positive in all 4 quadrants.  GU: no CVA tenderness Musculoskeletal: Mild swelling of the left knee, erythema, or stiffness, ROM full and no nontender Ext: no edema and no cyanosis, pulses palpable bilaterally (DP and PT)  Hematology: no  cervical, inginal, or axillary adenopathy.  Neurological: A&O x3, Strenght is normal and symmetric bilaterally, cranial nerve II-XII are grossly intact, no focal motor deficit, sensory intact to light touch bilaterally.  Skin: Warm, dry and intact. No rash, cyanosis, or clubbing.  Psychiatric: Normal mood and affect. Speech is slightly stuttered     Data Review   Micro Results No results found for this or any previous visit (from the past 240 hour(s)).  Radiology Reports Ct Abdomen Pelvis W Contrast  08/25/2015  CLINICAL DATA:  60 year old female with rectal bleeding EXAM: CT ABDOMEN AND PELVIS WITH CONTRAST TECHNIQUE: Multidetector CT imaging of the abdomen and pelvis was performed using the standard protocol following bolus administration of intravenous contrast. CONTRAST:  ISOVUE-300 IOPAMIDOL (ISOVUE-300) INJECTION 61% COMPARISON:  None. FINDINGS: The visualized lung bases are clear. Caps no intra-abdominal free air or free fluid. There multiple stones within the gallbladder. No pericholecystic fluid. Ultrasound may provide better evaluation of gallbladder. The liver appears unremarkable. There is atrophy of the pancreas with dilatation of the main pancreatic duct. Innumerable coarse calcification of the pancreas noted findings sequela of chronic pancreatitis. No definite peripancreatic stranding identified to  suggest acute pancreatitis. Correlation with pancreatic enzymes recommended. The liver spleen, adrenal glands, kidneys, visualized ureters appear unremarkable. There is diffusely thickened and trabecular appearance of the urinary bladder likely related to chronic cystitis or chronic bladder outlet obstruction. Correlation with urinalysis recommended to exclude superimposed acute the TI. Underlying infiltrative lesion is less likely. Cystoscopy may provide additional information if clinically indicated. The uterus is anteverted and grossly unremarkable. Copious amount of dense stool  noted throughout the colon. There is no evidence of bowel obstruction or active inflammation. Normal appendix. The abdominal aorta and IVC appear unremarkable. No portal venous gas identified. There is a circumaortic left renal vein anatomy. There is no adenopathy. Midline vertical anterior pelvic wall incisional scar. There is diastases of anterior abdominal wall musculature at the level of the umbilicus. The abdominal wall soft tissues are otherwise unremarkable. There is osteopenia with degenerative changes of the spine. 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 No acute fracture. IMPRESSION: Constipation. No evidence of bowel obstruction or inflammation. Normal appendix. Cholelithiasis. Changes of chronic pancreatitis. Correlation with pancreatic enzymes recommended to exclude acute pancreatitis. Diffusely thickened and trabecular appearance of the bladder likely related to chronic bladder outlet obstruction or chronic UTI. Electronically Signed   By: Elgie Collard M.Mckay.   On: 08/25/2015 23:28     CBC  Recent Labs Lab 08/25/15 1941 08/25/15 2335  WBC 12.8*  --   HGB 11.9* 11.8*  HCT 36.5 34.3*  PLT 299  --   MCV 82.4  --   MCH 26.9  --   MCHC 32.6  --   RDW 15.3  --   LYMPHSABS 3.9  --   MONOABS 0.8  --   EOSABS 0.4  --   BASOSABS 0.0  --     Chemistries   Recent Labs Lab 08/25/15 1941  NA 142  K 4.3  CL 105  CO2 24  GLUCOSE 99  BUN 20  CREATININE 0.62  CALCIUM 9.5  AST 26  ALT 20  ALKPHOS 104  BILITOT 0.3   ------------------------------------------------------------------------------------------------------------------ estimated creatinine clearance is 72.6 mL/min (by C-G formula based on Cr of 0.62). ------------------------------------------------------------------------------------------------------------------ No results for input(s): HGBA1C in the last 72  hours. ------------------------------------------------------------------------------------------------------------------ No results for input(s): CHOL, HDL, LDLCALC, TRIG, CHOLHDL, LDLDIRECT in the last 72 hours. ------------------------------------------------------------------------------------------------------------------ No results for input(s): TSH, T4TOTAL, T3FREE, THYROIDAB in the last 72 hours.  Invalid input(s): FREET3 ------------------------------------------------------------------------------------------------------------------ No results for input(s): VITAMINB12, FOLATE, FERRITIN, TIBC, IRON, RETICCTPCT in the last 72 hours.  Coagulation profile  Recent Labs Lab 08/25/15 1941  INR 1.41    No results for input(s): DDIMER in the last 72 hours.  Cardiac Enzymes No results for input(s): CKMB, TROPONINI, MYOGLOBIN in the last 168 hours.  Invalid input(s): CK ------------------------------------------------------------------------------------------------------------------ Invalid input(s): POCBNP   CBG:  Recent Labs Lab 08/25/15 1904  GLUCAP 168*         Assessment/Plan Rectal bleed: Acute. Patient with reports of one large bloody bowel movement yesterday morning. Found to be guaiac positive on presentation. Hemoglobin initially 11.9 on presentation and has been relatively stable with 2 blood draws. Suspect possibility of a bleeding hemorrhoid versus anal bleed secondary to history of constipation. - Admit to a telemetry bed - NPO for now - Strict in's and out's - H&H repeat in 4 hours x 2, if stable patient likely should be able to eat - May consider consultation with GI in a.m. for further evaluation.  Sepsis secondary to urinary tract infection: WBC elevated at 12.8,  lactic acid 3.33, heart rate 121, and positive urinalysis on admission. - Follow-up urine culture - Empiric antibiotics of Rocephin - Tylenol prn fever  Anemia: Hemoglobin appears stable  at 11.8 - Continue to monitor  Constipation: Acute on chronic. Patient notes bouts with constipation. Physical exam reveals a mildly distended abdomen. CT scan showing moderate constipation with no signs of obstruction. - Would recommend placing patient on a bowel regimen once presenting problem is resolved.  Tachycardia: Heart rates acutely elevated at 121 on admission. Patient denies any chest pain symptoms or shortness of breath. - Check 12-lead EKG   Left leg pain and swelling: In lieu of the patient recently reporting a fall - Check xray of the left knee  Diabetes mellitus type 2 with neuropathy - Metformin on hold - Continue gabapentin - CBGs every before meals with a sensitive sliding scale insulin for now  History of DVT of the left femoral vein: It appears upon review of previous records that the physician at the nursing facility he was considering stopping therapy as DVT occurred in 2015. - Xarelto on hold, restart when able to verify patient not actively bleeding  History of psychosis - Continue olanzapine  Anxiety - Ativan prn anxiety  SCDs for DVT prophylaxis  Code Status:   full Family Communication: bedside Disposition Plan: admit   Total time spent 55 minutes.Greater than 50% of this time was spent in counseling, explanation of diagnosis, planning of further management, and coordination of care  Clydie Braun Sarah Mckay Pager [phone]  If 7PM-7AM, please contact night-coverage [email or web address] Password [email or web address] 08/26/2015, 2:24 AM

## 2015-08-26 NOTE — Progress Notes (Signed)
Patient is very anxious about the bed alarm going off, the Environmental Services cleaning the room opposite the patient's room.  Patient is continually trying to climb out of bed and has pulled out her IV site  PCP  was notified

## 2015-08-26 NOTE — Progress Notes (Signed)
0430 Lactic acid not collected on time due to inpatient admission order not being placed in time for am lab draw. This RN placed inpatient order per previous ED lactic acid order and made lab aware of lab needing to be drawn. Mardene CelesteAsaro, Alexei Doswell I

## 2015-08-27 DIAGNOSIS — K5900 Constipation, unspecified: Secondary | ICD-10-CM

## 2015-08-27 DIAGNOSIS — D649 Anemia, unspecified: Secondary | ICD-10-CM

## 2015-08-27 DIAGNOSIS — N3000 Acute cystitis without hematuria: Secondary | ICD-10-CM

## 2015-08-27 DIAGNOSIS — F10959 Alcohol use, unspecified with alcohol-induced psychotic disorder, unspecified: Secondary | ICD-10-CM

## 2015-08-27 DIAGNOSIS — E1142 Type 2 diabetes mellitus with diabetic polyneuropathy: Secondary | ICD-10-CM

## 2015-08-27 LAB — BASIC METABOLIC PANEL
Anion gap: 11 (ref 5–15)
BUN: 10 mg/dL (ref 6–20)
CHLORIDE: 110 mmol/L (ref 101–111)
CO2: 24 mmol/L (ref 22–32)
CREATININE: 0.5 mg/dL (ref 0.44–1.00)
Calcium: 9.5 mg/dL (ref 8.9–10.3)
GFR calc Af Amer: >60 mL/min (ref >60)
GLUCOSE: 117 mg/dL — AB (ref 65–99)
POTASSIUM: 3.9 mmol/L (ref 3.5–5.1)
SODIUM: 145 mmol/L (ref 135–145)

## 2015-08-27 LAB — CBC
HEMATOCRIT: 34.2 % — AB (ref 36.0–46.0)
Hemoglobin: 11.2 g/dL — ABNORMAL LOW (ref 12.0–15.0)
MCH: 27.3 pg (ref 26.0–34.0)
MCHC: 32.7 g/dL (ref 30.0–36.0)
MCV: 83.4 fL (ref 78.0–100.0)
PLATELETS: 278 10*3/uL (ref 150–400)
RBC: 4.1 MIL/uL (ref 3.87–5.11)
RDW: 15.6 % — AB (ref 11.5–15.5)
WBC: 9.9 10*3/uL (ref 4.0–10.5)

## 2015-08-27 LAB — GLUCOSE, CAPILLARY
Glucose-Capillary: 146 mg/dL — ABNORMAL HIGH (ref 65–99)
Glucose-Capillary: 96 mg/dL (ref 65–99)
Glucose-Capillary: 107 mg/dL — ABNORMAL HIGH (ref 65–99)
Glucose-Capillary: 140 mg/dL — ABNORMAL HIGH (ref 65–99)

## 2015-08-27 LAB — LACTIC ACID, PLASMA: Lactic Acid, Venous: 1.1 mmol/L (ref 0.5–2.0)

## 2015-08-27 MED ORDER — SENNA 8.6 MG PO TABS
2.0000 | ORAL_TABLET | Freq: Every day | ORAL | Status: DC
  Administered 2015-08-27 – 2015-08-28 (×2): 17.2 mg via ORAL
  Filled 2015-08-27 (×2): qty 2

## 2015-08-27 MED ORDER — POLYETHYLENE GLYCOL 3350 17 G PO PACK
17.0000 g | PACK | Freq: Every day | ORAL | Status: DC
  Administered 2015-08-27 – 2015-08-28 (×2): 17 g via ORAL
  Filled 2015-08-27 (×2): qty 1

## 2015-08-27 MED ORDER — HALOPERIDOL LACTATE 5 MG/ML IJ SOLN
1.0000 mg | INTRAMUSCULAR | Status: DC | PRN
  Administered 2015-08-27: 1 mg via INTRAMUSCULAR
  Filled 2015-08-27: qty 1

## 2015-08-27 MED ORDER — MAGNESIUM HYDROXIDE 400 MG/5ML PO SUSP
30.0000 mL | Freq: Once | ORAL | Status: AC
  Administered 2015-08-27: 30 mL via ORAL
  Filled 2015-08-27: qty 30

## 2015-08-27 NOTE — Progress Notes (Signed)
[facility] PROGRESS NOTE   Sarah Mckay ZOX:[id number] DOB: 11/17/1955 DOA: 08/25/2015 PCP: Margit HanksALEXANDER, ANNE D, MD  HPI/Subjective: Denies any complaints today, awake and alert but disoriented. Per report she has history of dementia.  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 Patient with reports of one large bloody bowel movement yesterday morning.  Found to be guaiac positive on presentation.  Hemoglobin continues to be stable at 11.2, hold on GI consultation  Sepsis secondary to urinary tract infection WBC elevated at 12.8, lactic acid 3.33, heart rate 121, and positive urinalysis on admission. Started on Rocephin in the ED, evidence of end organ damage with lactic acid elevated at 3.3 on admission. Repeat lactic acid in a.m. was 1.1.  Anemia Hemoglobin appears stable at 11.2 - Continue to monitor  Constipation Acute on chronic. Patient notes bouts with constipation. Physical exam reveals a mildly distended abdomen. CT scan showing moderate constipation with no signs of obstruction. Started on MiraLAX and Senokot.  Tachycardia: Heart rates acutely elevated at 121 on admission. Patient denies any chest pain symptoms or shortness of breath. - Check 12-lead EKG   Left leg pain and swelling: In lieu of the patient recently reporting a fall - Check xray of the left knee  Diabetes mellitus type 2 with neuropathy - Metformin on hold - Continue gabapentin - CBGs every before meals with a sensitive sliding scale insulin for now  History of DVT of the left femoral vein It appears upon review of previous records that the physician at the nursing facility he was considering stopping therapy as DVT occurred in 2015. - Xarelto on hold,  restart when able to verify patient not actively bleeding  History of psychosis - Continue olanzapine  Anxiety - Ativan prn anxiety  SCDs for DVT prophylaxis  Code Status: Full Code Family Communication: Plan discussed with the patient. Disposition Plan: Remains inpatient Diet: Diet Carb Modified Fluid consistency:: Thin; Room service appropriate?: Yes  Consultants:  None  Procedures:  None  Antibiotics:  Rocephin (indicate start date, and stop date if known)   Objective: Filed Vitals:   08/26/15 2128 08/27/15 0507  BP: 140/70 120/61  Pulse: 110 103  Temp: 98.4 F (36.9 C) 98.1 F (36.7 C)  Resp: 20 20    Intake/Output Summary (Last 24 hours) at 08/27/15 1410 Last data filed at 08/27/15 0752  Gross per 24 hour  Intake 2712.5 ml  Output      0 ml  Net 2712.5 ml   Filed Weights   08/25/15 1849 08/26/15 0340  Weight: 61.236 kg (135 lb) 69.491 kg (153 lb 3.2 oz)    Exam: General: Alert and awake, oriented x3, not in any acute distress. HEENT: anicteric sclera, pupils reactive to light and accommodation, EOMI CVS: S1-S2 clear, no murmur rubs or gallops Chest: clear to auscultation bilaterally, no wheezing, rales or rhonchi Abdomen: soft nontender, nondistended, normal bowel sounds, no organomegaly Extremities: no cyanosis, clubbing or edema noted bilaterally Neuro: Cranial nerves II-XII intact, no focal neurological deficits  Data Reviewed: Basic Metabolic Panel:  Recent Labs Lab 08/25/15 1941 08/26/15 0509 08/27/15 0502  NA 142 144 145  K 4.3 4.1 3.9  CL 105 111 110  CO2 24 25 24   GLUCOSE 99 141* 117*  BUN CREATININE 0.62 0.65 0.50  CALCIUM 9.5 9.2 9.5   Liver Function Tests:  Recent Labs Lab 08/25/15 1941  AST 26  ALT 20  ALKPHOS 104  BILITOT 0.3  PROT 8.1  ALBUMIN 3.7    Recent Labs Lab 08/26/15 0509  LIPASE 19   No results for input(s): AMMONIA in the last 168 hours. CBC:  Recent Labs Lab 08/25/15 1941  08/25/15 2335 08/26/15 0205 08/26/15 0509 08/27/15 0502  WBC 12.8*  --   --   --  9.9  NEUTROABS 7.6  --   --   --   --   HGB 11.9* 11.8* 11.7* 11.1* 11.2*  HCT 36.5 34.3* 34.7* 34.0* 34.2*  MCV 82.4  --   --   --  83.4  PLT 299  --   --   --  278   Cardiac Enzymes: No results for input(s): CKTOTAL, CKMB, CKMBINDEX, TROPONINI in the last 168 hours. BNP (last 3 results) No results for input(s): BNP in the last 8760 hours.  ProBNP (last 3 results) No results for input(s): PROBNP in the last 8760 hours.  CBG:  Recent Labs Lab 08/26/15 1149 08/26/15 1636 08/26/15 2127 08/27/15 0740 08/27/15 1135  GLUCAP 120* 155* 126* 146* 96    Micro Recent Results (from the past 240 hour(s))  Urine culture     Status: None (Preliminary result)   Collection Time: 08/26/15 12:32 AM  Result Value Ref Range Status   Specimen Description URINE, CATHETERIZED  Final   Special Requests NONE  Final   Culture   Final    >=100,000 COLONIES/mL GRAM NEGATIVE RODS CULTURE REINCUBATED FOR BETTER GROWTH Performed at [facility]    Report Status PENDING  Incomplete  MRSA PCR Screening     Status: None   Collection Time: 08/26/15  4:18 AM  Result Value Ref Range Status   MRSA by PCR NEGATIVE NEGATIVE Final    Comment:        The GeneXpert MRSA Assay (FDA approved for NASAL specimens only), is one component of a comprehensive MRSA colonization surveillance program. It is not intended to diagnose MRSA infection nor to guide or monitor treatment for MRSA infections.      Studies: Ct Abdomen Pelvis W Contrast  08/25/2015  CLINICAL DATA:  60 year old female with rectal bleeding EXAM: CT ABDOMEN AND PELVIS WITH CONTRAST TECHNIQUE: Multidetector CT imaging of the abdomen and pelvis was performed using the standard protocol following bolus administration of intravenous contrast. CONTRAST:  ISOVUE-300 IOPAMIDOL (ISOVUE-300) INJECTION 61% COMPARISON:  None. FINDINGS: The visualized  lung bases are clear. Caps no intra-abdominal free air or free fluid. There multiple stones within the gallbladder. No pericholecystic fluid. Ultrasound may provide better evaluation of gallbladder. The liver appears unremarkable. There is atrophy of the pancreas with dilatation of the main pancreatic duct. Innumerable coarse calcification of the pancreas noted findings sequela of chronic pancreatitis. No definite peripancreatic stranding identified to suggest acute pancreatitis. Correlation with pancreatic enzymes recommended. The liver spleen, adrenal glands, kidneys, visualized ureters appear unremarkable. There is diffusely thickened and trabecular appearance of the urinary bladder likely related to chronic cystitis or chronic bladder outlet obstruction. Correlation with urinalysis recommended to exclude superimposed acute the TI. Underlying infiltrative lesion is less likely. Cystoscopy may provide additional information if clinically indicated. The uterus is anteverted and grossly unremarkable. Copious amount of dense stool noted throughout the colon. There is no evidence of bowel obstruction or active inflammation. Normal appendix.  The abdominal aorta and IVC appear unremarkable. No portal venous gas identified. There is a circumaortic left renal vein anatomy. There is no adenopathy. Midline vertical anterior pelvic wall incisional scar. There is diastases of anterior abdominal wall musculature at the level of the umbilicus. The abdominal wall soft tissues are otherwise unremarkable. There is osteopenia with degenerative changes of the spine. 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 No acute fracture. IMPRESSION: Constipation. No evidence of bowel obstruction or inflammation. Normal appendix. Cholelithiasis. Changes of chronic pancreatitis. Correlation with pancreatic enzymes recommended to exclude acute pancreatitis. Diffusely thickened and trabecular appearance of the bladder likely related to  chronic bladder outlet obstruction or chronic UTI. Electronically Signed   By: Elgie Collard M.D.   On: 08/25/2015 23:28   Dg Knee Left Port  08/26/2015  CLINICAL DATA:  60 year old female with chronic knee pain. EXAM: PORTABLE LEFT KNEE - 1-2 VIEW COMPARISON:  Radiograph dated 08/04/2014 FINDINGS: There is no acute fracture or dislocation. The bones are osteopenic. There is narrowing of the medial and lateral compartments of the knee compatible with chronic osteoarthritic changes. Stable appearing sclerotic changes in the proximal tibia may be related to areas of infarct. There is no joint effusion. Mild subcutaneous soft tissue edema. IMPRESSION: No acute fracture or dislocation. Electronically Signed   By: Elgie Collard M.D.   On: 08/26/2015 03:38    Scheduled Meds: . cefTRIAXone (ROCEPHIN)  IV  1 g Intravenous Q24H  . cholecalciferol  1,000 Units Oral Daily  . diclofenac sodium  2 g Topical QID  . feeding supplement (PRO-STAT SUGAR FREE 64)  30 mL Oral BID  . gabapentin  900 mg Oral TID  . insulin aspart  0-9 Units Subcutaneous TID WC  . multivitamin with minerals  2 tablet Oral QHS  . OLANZapine  7.5 mg Oral QHS  . sodium chloride  1,000 mL Intravenous Once  . sodium chloride flush  3 mL Intravenous Q12H  . thiamine  100 mg Oral Daily   Continuous Infusions:       Time spent: 35 minutes    [facility]  [facility] Pager [phone] If 7PM-7AM, please contact night-coverage at [email or web address], password [facility] 08/27/2015, 2:10 PM  LOS: 1 day

## 2015-08-27 NOTE — Progress Notes (Signed)
PT Cancellation Note  Patient Details Name: Sarah Mckay MRN: [id number] DOB: 01/07/1956   Cancelled Treatment:    Reason Eval/Treat Not Completed: PT screened, no needs identified, will sign off. Pt is long term care SNF resident. No acute PT needs. Will sign off. Thanks.   Rebeca AlertJannie Hilja Kintzel, MPT Pager: [phone]

## 2015-08-28 DIAGNOSIS — Z86718 Personal history of other venous thrombosis and embolism: Secondary | ICD-10-CM

## 2015-08-28 LAB — CBC
HCT: 34 % — ABNORMAL LOW (ref 36.0–46.0)
Hemoglobin: 11.1 g/dL — ABNORMAL LOW (ref 12.0–15.0)
MCH: 27.4 pg (ref 26.0–34.0)
MCHC: 32.6 g/dL (ref 30.0–36.0)
MCV: 84 fL (ref 78.0–100.0)
PLATELETS: 282 10*3/uL (ref 150–400)
RBC: 4.05 MIL/uL (ref 3.87–5.11)
RDW: 15.6 % — ABNORMAL HIGH (ref 11.5–15.5)
WBC: 9.4 10*3/uL (ref 4.0–10.5)

## 2015-08-28 LAB — BASIC METABOLIC PANEL
ANION GAP: 10 (ref 5–15)
BUN: 11 mg/dL (ref 6–20)
CO2: 26 mmol/L (ref 22–32)
Calcium: 9.3 mg/dL (ref 8.9–10.3)
Chloride: 104 mmol/L (ref 101–111)
Creatinine, Ser: 0.66 mg/dL (ref 0.44–1.00)
GFR calc non Af Amer: >60 mL/min (ref >60)
GLUCOSE: 110 mg/dL — AB (ref 65–99)
POTASSIUM: 4 mmol/L (ref 3.5–5.1)
Sodium: 140 mmol/L (ref 135–145)

## 2015-08-28 LAB — GLUCOSE, CAPILLARY
GLUCOSE-CAPILLARY: 151 mg/dL — AB (ref 65–99)
Glucose-Capillary: 162 mg/dL — ABNORMAL HIGH (ref 65–99)

## 2015-08-28 MED ORDER — POLYETHYLENE GLYCOL 3350 17 G PO PACK: 17.0000 g | PACK | Freq: Every day | ORAL | Status: DC

## 2015-08-28 MED ORDER — CEFUROXIME AXETIL 500 MG PO TABS: 500.0000 mg | ORAL_TABLET | Freq: Two times a day (BID) | ORAL | Status: DC

## 2015-08-28 MED ORDER — LORAZEPAM 0.5 MG PO TABS: 0.5000 mg | ORAL_TABLET | Freq: Three times a day (TID) | ORAL | Status: DC | PRN

## 2015-08-28 MED ORDER — SENNA 8.6 MG PO TABS: 2.0000 | ORAL_TABLET | Freq: Every day | ORAL | Status: DC

## 2015-08-28 MED ORDER — TRAMADOL HCL 50 MG PO TABS: 50.0000 mg | ORAL_TABLET | Freq: Three times a day (TID) | ORAL | Status: DC

## 2015-08-28 NOTE — Progress Notes (Signed)
Called [facility] to [facility]. Patient will be transported via [facility]. Patient aware.

## 2015-08-28 NOTE — Progress Notes (Signed)
Pt for discharge to [facility].  CSW facilitated pt discharge needs including contacting facility, faxing pt discharge information via [employer], discussing with pt at bedside, left message with pt brother, Berna SpareMarcus, and arranged ambulance transport via [facility] for [facility].  No further social work needs identified at this time.  CSW signing off.   Loletta SpecterSuzanna Mithra Spano, MSW, LCSW Clinical Social Work Weekend coverage [phone]

## 2015-08-28 NOTE — Discharge Summary (Signed)
Physician Discharge Summary  Oaklynn Stierwalt ZOX:[id number] DOB: 10/11/1955 DOA: 08/25/2015  PCP: Margit Hanks, MD  Admit date: 08/25/2015 Discharge date: 08/28/2015  Time spent: 40 minutes  Recommendations for Outpatient Follow-up:  1. Follow-up with primary care physician in the nursing home. 2. Ceftin for 3 more days, and date is 08/31/2015.   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 Dolata is a 60 year old female with past medical history significant for diabetes mellitus type 2, dementia related to previous history of alcohol abuse, DVT recently stopped off anticoagulation, and seizure disorder; who presents with complaints of having a large bloody bowel movement. Symptoms occurred this afternoon she reports having a large clot found in the toilet bowl. Associated symptoms include reports of mild constipation. The patient is a long-term resident of [facility]. She denies having any shortness of breath, chest pain, abdominal pain, nausea, or vomiting. She reports recently falling on the left leg and reports pain there.  Upon admission patient was seen to have a heart rate of up to 121, temp 99.67F, respirations 22, O2 saturations 98% on room air. Lab work revealed WBC 12.8, hemoglobin 11.8, lactic acid 3.33, all other electrolytes within normal limits. UA positive for many bacteria, small leukocyte esterase, positive nitrites, and too numerous to count WBCs. CT scan showing signs of constipation with no significant bowel obstruction. Patient  was found to be guaiac positive on examination. She is given IV Rocephin and [facility] were called to admit the patient for further workup.  Hospital Course:   Rectal bleed Acute. Patient with reports of one large bloody bowel movement yesterday morning.  Found to be guaiac positive on presentation.  Hemoglobin continues to be stable at 11.2. This is mildly rectal bleeding happened at the same time patient was having Xarelto and suffers from constipation. No rectal bleeding anymore, this is resolved, please note that Xarelto was held during this hospital stay.  Sepsis secondary to urinary tract infection WBC elevated at 12.8, lactic acid 3.33, heart rate 121, and positive urinalysis on admission. Started on Rocephin in the ED, evidence of end organ damage with lactic acid elevated at 3.3 on admission. Repeat lactic acid in a.m. was 1.1. Final urine culture was still pending at the time of discharge, discharge and Ceftin to complete 5 days of antibiotics.  Anemia Hemoglobin appears stable at 11.2, did not require blood transfusion.  Constipation Acute on chronic. Patient notes bouts with constipation. Physical exam reveals a mildly distended abdomen. CT scan showing moderate constipation with no signs of obstruction. Started on MiraLAX and Senokot.  Tachycardia:  Heart rates acutely elevated at 121 on admission. Patient denies any chest pain symptoms or shortness of breath. This is likely secondary to the concurrent UTI.  Left leg pain and swelling: In lieu of the patient recently reporting a fall - X-ray of the left knee without findings, patient has history of DVT in the left femoral vein.  Diabetes mellitus type 2 with neuropathy - Metformin on hold - Continue gabapentin -  CBGs every before meals with a sensitive sliding scale insulin for now  History of DVT of the left femoral vein It appears upon review of previous records that the physician at the nursing facility  he was considering stopping therapy as DVT occurred in 2015. - Xarelto held at this admission, been on it since 10/02/2014. - We'll leave it to the PCP to evaluate if she needs to be restarted on anticoagulation. But safety wise, I think if the constipation is controlled she can be safely back on any type of anti-coagulation.  History of psychosis - Continue olanzapine  Anxiety - Ativan prn anxiety   Procedures:  None  Consultations:  None  Discharge Exam: Filed Vitals:   08/27/15 2033 08/28/15 0430  BP: 114/65 120/65  Pulse: 104 100  Temp: 98.2 F (36.8 C) 98.4 F (36.9 C)  Resp: 19 20  General: Alert and awake, Disoriented, not in any acute distress. HEENT: anicteric sclera, pupils reactive to light and accommodation, EOMI CVS: S1-S2 clear, no murmur rubs or gallops Chest: clear to auscultation bilaterally, no wheezing, rales or rhonchi Abdomen: soft nontender, nondistended, normal bowel sounds, no organomegaly Extremities: no cyanosis, clubbing or edema noted bilaterally Neuro: Cranial nerves II-XII intact, no focal neurological deficits   Discharge Instructions   Discharge Instructions    Diet - low sodium heart healthy    Complete by:  As directed      Increase activity slowly    Complete by:  As directed           Current Discharge Medication List    START taking these medications   Details  cefUROXime (CEFTIN) 500 MG tablet Take 1 tablet (500 mg total) by mouth 2 (two) times daily with a meal.    polyethylene glycol (MIRALAX / GLYCOLAX) packet Take 17 g by mouth daily. Qty: 14 each, Refills: 0    senna (SENOKOT) 8.6 MG TABS tablet Take 2 tablets (17.2 mg total) by mouth daily. Qty: 120 each, Refills: 0      CONTINUE these medications which have CHANGED   Details  LORazepam (ATIVAN) 0.5 MG tablet Take 1 tablet (0.5 mg total) by mouth every 8 (eight) hours as needed for anxiety. Qty: 10 tablet, Refills: 0    traMADol (ULTRAM) 50 MG tablet Take 1  tablet (50 mg total) by mouth 3 (three) times daily. Qty: 20 tablet, Refills: 0   Associated Diagnoses: Chronic pain; Primary osteoarthritis of both knees      CONTINUE these medications which have NOT CHANGED   Details  Amino Acids-Protein Hydrolys (FEEDING SUPPLEMENT, PRO-STAT SUGAR FREE 64,) LIQD Take 30 mLs by mouth 2 (two) times daily. 0800 and 1700    cholecalciferol (VITAMIN D) 1000 UNITS tablet Take 1,000 Units by mouth daily.    diclofenac sodium (VOLTAREN) 1 % GEL Apply 2 g topically 4 (four) times daily. To both knees Qty: 1 Tube, Refills: 11   Associated Diagnoses: Primary osteoarthritis of both knees    gabapentin (NEURONTIN) 300 MG capsule Take 3 capsules (900 mg total) by mouth 3 (three) times daily. Qty: 120 capsule, Refills: 11   Associated Diagnoses: Chronic pain; Primary osteoarthritis of both knees    metFORMIN (GLUCOPHAGE-XR) 500 MG 24 hr tablet Take 500 mg by mouth daily with breakfast.    Multiple Vitamin (MULTIVITAMIN WITH MINERALS) TABS tablet Take 2 tablets by mouth at bedtime.     OLANZapine (ZYPREXA) 7.5 MG tablet Take 7.5 mg by mouth at bedtime.    thiamine 100  MG tablet Take 100 mg by mouth daily.    Melatonin 3 MG TABS Take 3 mg by mouth at bedtime as needed (sleep).       STOP taking these medications     rivaroxaban (XARELTO) 20 MG TABS tablet        No Known Allergies Follow-up Information    Follow up with [facility].   Specialty:  Skilled Nursing Facility   Contact information:   [address] [place] [address] [phone]       The results of significant diagnostics from this hospitalization (including imaging, microbiology, ancillary and laboratory) are listed below for reference.    Significant Diagnostic Studies: Ct Abdomen Pelvis W Contrast  08/25/2015  CLINICAL DATA:  60 year old female with rectal bleeding EXAM: CT ABDOMEN AND PELVIS WITH CONTRAST TECHNIQUE: Multidetector CT  imaging of the abdomen and pelvis was performed using the standard protocol following bolus administration of intravenous contrast. CONTRAST:  ISOVUE-300 IOPAMIDOL (ISOVUE-300) INJECTION 61% COMPARISON:  None. FINDINGS: The visualized lung bases are clear. Caps no intra-abdominal free air or free fluid. There multiple stones within the gallbladder. No pericholecystic fluid. Ultrasound may provide better evaluation of gallbladder. The liver appears unremarkable. There is atrophy of the pancreas with dilatation of the main pancreatic duct. Innumerable coarse calcification of the pancreas noted findings sequela of chronic pancreatitis. No definite peripancreatic stranding identified to suggest acute pancreatitis. Correlation with pancreatic enzymes recommended. The liver spleen, adrenal glands, kidneys, visualized ureters appear unremarkable. There is diffusely thickened and trabecular appearance of the urinary bladder likely related to chronic cystitis or chronic bladder outlet obstruction. Correlation with urinalysis recommended to exclude superimposed acute the TI. Underlying infiltrative lesion is less likely. Cystoscopy may provide additional information if clinically indicated. The uterus is anteverted and grossly unremarkable. Copious amount of dense stool noted throughout the colon. There is no evidence of bowel obstruction or active inflammation. Normal appendix. The abdominal aorta and IVC appear unremarkable. No portal venous gas identified. There is a circumaortic left renal vein anatomy. There is no adenopathy. Midline vertical anterior pelvic wall incisional scar. There is diastases of anterior abdominal wall musculature at the level of the umbilicus. The abdominal wall soft tissues are otherwise unremarkable. There is osteopenia with degenerative changes of the spine. 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 No acute fracture. IMPRESSION: Constipation. No evidence of bowel obstruction  or inflammation. Normal appendix. Cholelithiasis. Changes of chronic pancreatitis. Correlation with pancreatic enzymes recommended to exclude acute pancreatitis. Diffusely thickened and trabecular appearance of the bladder likely related to chronic bladder outlet obstruction or chronic UTI. Electronically Signed   By: Elgie Collard M.D.   On: 08/25/2015 23:28   Dg Knee Left Port  08/26/2015  CLINICAL DATA:  60 year old female with chronic knee pain. EXAM: PORTABLE LEFT KNEE - 1-2 VIEW COMPARISON:  Radiograph dated 08/04/2014 FINDINGS: There is no acute fracture or dislocation. The bones are osteopenic. There is narrowing of the medial and lateral compartments of the knee compatible with chronic osteoarthritic changes. Stable appearing sclerotic changes in the proximal tibia may be related to areas of infarct. There is no joint effusion. Mild subcutaneous soft tissue edema. IMPRESSION: No acute fracture or dislocation. Electronically Signed   By: Elgie Collard M.D.   On: 08/26/2015 03:38    Microbiology: Recent Results (from the past 240 hour(s))  Urine culture     Status: None (Preliminary result)   Collection Time: 08/26/15 12:32 AM  Result  Value Ref Range Status   Specimen Description URINE, CATHETERIZED  Final   Special Requests NONE  Final   Culture   Final    >=100,000 COLONIES/mL GRAM NEGATIVE RODS CULTURE REINCUBATED FOR BETTER GROWTH Performed at [facility]    Report Status PENDING  Incomplete  MRSA PCR Screening     Status: None   Collection Time: 08/26/15  4:18 AM  Result Value Ref Range Status   MRSA by PCR NEGATIVE NEGATIVE Final    Comment:        The GeneXpert MRSA Assay (FDA approved for NASAL specimens only), is one component of a comprehensive MRSA colonization surveillance program. It is not intended to diagnose MRSA infection nor to guide or monitor treatment for MRSA infections.      Labs: Basic Metabolic Panel:  Recent Labs Lab  08/25/15 1941 08/26/15 0509 08/27/15 0502 08/28/15 0529  NA 142 144 145 140  K 4.3 4.1 3.9 4.0  CL 105 111 110 104  CO2 GLUCOSE 99 141* 117* 110*  BUN CREATININE 0.62 0.65 0.50 0.66  CALCIUM 9.5 9.2 9.5 9.3   Liver Function Tests:  Recent Labs Lab 08/25/15 1941  AST 26  ALT 20  ALKPHOS 104  BILITOT 0.3  PROT 8.1  ALBUMIN 3.7    Recent Labs Lab 08/26/15 0509  LIPASE 19   No results for input(s): AMMONIA in the last 168 hours. CBC:  Recent Labs Lab 08/25/15 1941 08/25/15 2335 08/26/15 0205 08/26/15 0509 08/27/15 0502 08/28/15 0529  WBC 12.8*  --   --   --  9.9 9.4  NEUTROABS 7.6  --   --   --   --   --   HGB 11.9* 11.8* 11.7* 11.1* 11.2* 11.1*  HCT 36.5 34.3* 34.7* 34.0* 34.2* 34.0*  MCV 82.4  --   --   --  83.4 84.0  PLT 299  --   --   --  278 282   Cardiac Enzymes: No results for input(s): CKTOTAL, CKMB, CKMBINDEX, TROPONINI in the last 168 hours. BNP: BNP (last 3 results) No results for input(s): BNP in the last 8760 hours.  ProBNP (last 3 results) No results for input(s): PROBNP in the last 8760 hours.  CBG:  Recent Labs Lab 08/27/15 1135 08/27/15 1611 08/27/15 2018 08/28/15 0751 08/28/15 1119  GLUCAP 96 107* 140* 162* 151*       Signed:  Valori Hollenkamp A MD.  [facility] 08/28/2015, 11:42 AM

## 2015-08-29 LAB — URINE CULTURE

## 2015-08-30 ENCOUNTER — Non-Acute Institutional Stay (SKILLED_NURSING_FACILITY): Payer: Medicaid Other | Admitting: Adult Health

## 2015-08-30 ENCOUNTER — Encounter: Payer: Self-pay | Admitting: Adult Health

## 2015-08-30 DIAGNOSIS — F0391 Unspecified dementia with behavioral disturbance: Secondary | ICD-10-CM

## 2015-08-30 DIAGNOSIS — R1310 Dysphagia, unspecified: Secondary | ICD-10-CM | POA: Diagnosis not present

## 2015-08-30 DIAGNOSIS — K625 Hemorrhage of anus and rectum: Secondary | ICD-10-CM | POA: Diagnosis not present

## 2015-08-30 DIAGNOSIS — M170 Bilateral primary osteoarthritis of knee: Secondary | ICD-10-CM | POA: Diagnosis not present

## 2015-08-30 DIAGNOSIS — F03918 Unspecified dementia, unspecified severity, with other behavioral disturbance: Secondary | ICD-10-CM

## 2015-08-30 DIAGNOSIS — L89214 Pressure ulcer of right hip, stage 4: Secondary | ICD-10-CM

## 2015-08-30 DIAGNOSIS — K5901 Slow transit constipation: Secondary | ICD-10-CM | POA: Diagnosis not present

## 2015-08-30 DIAGNOSIS — F10959 Alcohol use, unspecified with alcohol-induced psychotic disorder, unspecified: Secondary | ICD-10-CM

## 2015-08-30 DIAGNOSIS — E1142 Type 2 diabetes mellitus with diabetic polyneuropathy: Secondary | ICD-10-CM | POA: Diagnosis not present

## 2015-08-30 NOTE — Progress Notes (Signed)
Patient ID: Sarah OxfordFrieda Mckay, female   DOB: 06/20/1955, 60 y.o.   MRN: [id number]   Facility:  [facility]       No Known Allergies  Chief Complaint  Patient presents with  . Hospitalization Follow-up    Hospital follow up    HPI:  She is a long term resident of this facility who has been hospitalized for rectal bleeding. She was treated conservatively her hgb was stable during her hospitalization. Her xarelto was stopped while in the hospital. She was found to have constipation as well. She will complete her treatment for her uti in 5 days.  She is unable to fully participate in the hpi or ros. There are no nursing concerns at this time.     Past Medical History  Diagnosis Date  . Seizures (HCC)   . Alcohol abuse   . Diabetes mellitus without complication (HCC)   . Dementia with behavioral problem   . Arthritis   . Dysphagia     and aspiration risk  . 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 No pertinent past surgical history.  VITAL SIGNS BP 105/63 mmHg  Pulse 88  Temp(Src) 98.5 F (36.9 C) (Oral)  Resp 18  Ht 5\' 6"  (1.676 m)  Wt 150 lb (68.04 kg)  BMI 24.22 kg/m2  SpO2 98%  LMP 03/25/2015 (Approximate)  Patient's Medications  New Prescriptions   No medications on file  Previous Medications   AMINO ACIDS-PROTEIN HYDROLYS (FEEDING SUPPLEMENT, PRO-STAT SUGAR FREE 64,) LIQD    Take 30 mLs by mouth 2 (two) times daily. 0800 and 1700   CEFUROXIME (CEFTIN) 500 MG TABLET    Take 1 tablet (500 mg total) by mouth 2 (two) times daily with a meal.   CHOLECALCIFEROL (VITAMIN D) 1000 UNITS TABLET    Take 1,000 Units by mouth daily.   DICLOFENAC SODIUM (VOLTAREN) 1 % GEL    Apply 2 g topically 4 (four) times daily. To both knees   GABAPENTIN (NEURONTIN) 300 MG CAPSULE    Take 3 capsules (900 mg total) by mouth 3 (three) times daily.   LORAZEPAM (ATIVAN) 0.5 MG TABLET    Take 1 tablet (0.5 mg total) by mouth every 8 (eight) hours as needed for anxiety.   MELATONIN  3 MG TABS    Take 3 mg by mouth at bedtime as needed (sleep).    METFORMIN (GLUCOPHAGE-XR) 500 MG 24 HR TABLET    Take 500 mg by mouth daily with breakfast.   MULTIPLE VITAMIN (MULTIVITAMIN WITH MINERALS) TABS TABLET    Take 2 tablets by mouth at bedtime.    OLANZAPINE (ZYPREXA) 7.5 MG TABLET    Take 7.5 mg by mouth at bedtime.   POLYETHYLENE GLYCOL (MIRALAX / GLYCOLAX) PACKET    Take 17 g by mouth daily.   SENNA (SENOKOT) 8.6 MG TABS TABLET    Take 2 tablets (17.2 mg total) by mouth daily.   THIAMINE 100 MG TABLET    Take 100 mg by mouth daily.   TRAMADOL (ULTRAM) 50 MG TABLET    Take 1 tablet (50 mg total) by mouth 3 (three) times daily.  Modified Medications   No medications on file  Discontinued Medications   No medications on file     SIGNIFICANT DIAGNOSTIC EXAMS  Not a candidate for dexa; mammogram or colonoscopy    10-02-14: left leg doppler: +dvt  10-08-14: bilateral hip /pelvic fracture: right hip with marked osteoarthritis  04-23-15: right distal ulna and forearm:  no acute fracture    LABS REVIEWED:   09-15-14: pre-albumin 17 09-16-14: wbc 9.7; hgb 11.0; hct 34.4; mcv 88.2; plt 319; glucose 106; bun 15; creat 0.6; k+4.3; na++138; liver normal albumin 3.8  11-06-14: pre-albumin 7  12-11-14; hgb a1c 7.3 07-14-15: glucose 141; bun 10.3; creat 0.58; k+ 4.1; na++143   08-25-15: wbc 12.8; hgb 11.9; hct 36.5; mcv 82.4; plt 299; glucose 99; bun 20; creat 0.62; k+ 4.3;na++142; liver normal albumin 3.7; urine culture: e-coli: ceftin; guaiac stool: +  08-28-15: wbc 9.4; hgb 11.1; hct 34.0; mcv 84.0; plt 282; glucose 110; bun 11; creat 0.66; k+ 4.0; na++140     Review of Systems Unable to perform ROS: dementia    Physical Exam Constitutional: No distress.  Frail   Neck: Neck supple. No JVD present. No thyromegaly present.  Cardiovascular: Normal rate, regular rhythm and intact distal pulses.   Respiratory: Effort normal and breath sounds normal. No respiratory distress.  GI: Soft.  Bowel sounds are normal. She exhibits no distension. There is no tenderness.  Musculoskeletal: She exhibits no edema.  Is able to move all extremities   Neurological: She is alert.  Skin: Skin is warm and dry. She is not diaphoretic. Right hip ulcer: 0.2 x 0.1 x 0.11 cm using hydroel     ASSESSMENT/ PLAN:  1. Diabetes: she is presently stable her hgb a1c 7.3; will continue metformin 500 mg daily;   and will monitor  Urine for micro-albumin is 7.4; is on lisinopril 2.5 mg daily   2. Dysphagia: no signs of aspiration present; will not make changes in her current plan of care and will monitor her status.   3. 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   4. Psychosis; due to alcohol: she is presently without change in status; will continue zyprexa 7.5 mg daily; thiamine 100 mg daily;  will monitor  5. 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 Her current weight is 150 pounds.   6. Right hip stage IV ulcer: will continue current wound care and will monitor   7. DVT left leg:  since 10-02-14. I have spoken with Dr. Lyn Hollingshead regarding her xarlelto use. Due to her bleeding will begin her on asa 81 mg daily and will monitor  9. Constipation: will miralax daily and senna 2 tabs daily     Time spent with patient  50   minutes >50% time spent counseling; reviewing medical record; tests; labs; and developing future plan of care       Synthia Innocent NP [facility]  Contact [phone] Monday through Friday 8am- 5pm  After hours call [phone]

## 2015-09-02 ENCOUNTER — Encounter: Payer: Self-pay | Admitting: Internal Medicine

## 2015-09-02 ENCOUNTER — Non-Acute Institutional Stay (SKILLED_NURSING_FACILITY): Payer: Medicaid Other | Admitting: Internal Medicine

## 2015-09-02 DIAGNOSIS — E1340 Other specified diabetes mellitus with diabetic neuropathy, unspecified: Secondary | ICD-10-CM

## 2015-09-02 DIAGNOSIS — D649 Anemia, unspecified: Secondary | ICD-10-CM | POA: Diagnosis not present

## 2015-09-02 DIAGNOSIS — A419 Sepsis, unspecified organism: Secondary | ICD-10-CM | POA: Diagnosis not present

## 2015-09-02 DIAGNOSIS — F419 Anxiety disorder, unspecified: Secondary | ICD-10-CM

## 2015-09-02 DIAGNOSIS — E1142 Type 2 diabetes mellitus with diabetic polyneuropathy: Secondary | ICD-10-CM

## 2015-09-02 DIAGNOSIS — K625 Hemorrhage of anus and rectum: Secondary | ICD-10-CM | POA: Diagnosis not present

## 2015-09-02 DIAGNOSIS — N3000 Acute cystitis without hematuria: Secondary | ICD-10-CM | POA: Diagnosis not present

## 2015-09-02 DIAGNOSIS — F10959 Alcohol use, unspecified with alcohol-induced psychotic disorder, unspecified: Secondary | ICD-10-CM

## 2015-09-02 DIAGNOSIS — K5901 Slow transit constipation: Secondary | ICD-10-CM

## 2015-09-02 DIAGNOSIS — Z86718 Personal history of other venous thrombosis and embolism: Secondary | ICD-10-CM

## 2015-09-02 NOTE — Progress Notes (Signed)
MRN: [id number] Name: Sarah Mckay  Sex: female Age: 60 y.o. DOB: May 19, 1956  PSC #: Ronni Rumble Facility/Room:204 Level Of Care: SNF Provider: Merrilee Seashore D Emergency Contacts: Extended Emergency Contact Information Primary Emergency Contact: Gato,Marcus Address: [address]          [place], [place] [address] [place] Home Phone: [phone] Mobile Phone: [phone] Relation: Brother Secondary Emergency Contact: Dewaine Oats [place] Home Phone: [phone] Relation: Niece  Code Status:   Allergies: Review of patient's allergies indicates no known allergies.  Chief Complaint  Patient presents with  . Readmit To SNF    HPI: Patient is 60 y.o. female whopast medical history significant for diabetes mellitus type 2, dementia related to previous history of alcohol abuse, DVT recently stopped off anticoagulation, and seizure disorder; who presents with complaints of having a large bloody bowel movement. Symptoms occurred this afternoon she reports having a large clot found in the toilet bowl. Associated symptoms include reports of mild constipation. The patient is a long-term resident of [facility]. She denies having any shortness of breath, chest pain, abdominal pain, nausea, or vomiting. She reports recently falling on the left leg and reports pain there.  Upon admission patient was seen to have a heart rate of up to 121, temp 99.30F, respirations 22, O2 saturations 98% on room air. Lab work revealed WBC 12.8, hemoglobin 11.8, lactic acid 3.33, all other electrolytes within normal limits. UA positive for many bacteria, small leukocyte esterase, positive nitrites, and too numerous to count WBCs. CT scan showing signs of constipation with no significant bowel obstruction. Patient was found to be guaiac positive on examination. Pt was admitted to [facility] from 3/29- 4/1where she was treated for rectal bleedng and sepsis 2/2  UTI. Pt is admitted to SNF for generalized weakness. While at SNF pt will be followed for DM, tx with SSI, psychosis, tx with zyprexa and anxiety , tx with ativan.  Past Medical History  Diagnosis Date  . Seizures (HCC)   . Alcohol abuse   . Diabetes mellitus without complication (HCC)   . Dementia with behavioral problem   . Arthritis   . Dysphagia     and aspiration risk  . 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    Medication List       This list is accurate as of: 09/02/15 11:59 PM.  Always use your most recent med list.               cefUROXime 500 MG tablet  Commonly known as:  CEFTIN  Take 1 tablet (500 mg total) by mouth 2 (two) times daily with a meal.     cholecalciferol 1000 units tablet  Commonly known as:  VITAMIN D  Take 1,000 Units by mouth daily.     diclofenac sodium 1 % Gel  Commonly known as:  VOLTAREN  Apply 2 g topically 4 (four) times daily. To both knees     feeding supplement (PRO-STAT SUGAR FREE 64) Liqd  Take 30 mLs by mouth 2 (two) times daily. 0800 and 1700     gabapentin 300 MG capsule  Commonly known as:  NEURONTIN  Take 3 capsules (900 mg total) by mouth 3 (three) times daily.     LORazepam 0.5 MG tablet  Commonly known as:  ATIVAN  Take 1 tablet (0.5 mg total) by mouth every 8 (eight) hours as needed for anxiety.  Melatonin 3 MG Tabs  Take 3 mg by mouth at bedtime as needed (sleep).     metFORMIN 500 MG 24 hr tablet  Commonly known as:  GLUCOPHAGE-XR  Take 500 mg by mouth daily with breakfast.     multivitamin with minerals Tabs tablet  Take 2 tablets by mouth at bedtime.     OLANZapine 7.5 MG tablet  Commonly known as:  ZYPREXA  Take 7.5 mg by mouth at bedtime.     polyethylene glycol packet  Commonly known as:  MIRALAX / GLYCOLAX  Take 17 g by mouth daily.     senna 8.6 MG Tabs tablet  Commonly known as:  SENOKOT  Take 2 tablets (17.2 mg total) by mouth daily.     thiamine 100 MG  tablet  Take 100 mg by mouth daily.     traMADol 50 MG tablet  Commonly known as:  ULTRAM  Take 1 tablet (50 mg total) by mouth 3 (three) times daily.        No orders of the defined types were placed in this encounter.    Immunization History  Administered Date(s) Administered  . Influenza-Unspecified 07/06/2015  . Pneumococcal-Unspecified 06/16/2014  . Tdap 01/18/2014    Social History  Substance Use Topics  . Smoking status: Current Every Day Smoker -- 0.50 packs/day for 15 years    Types: Cigarettes  . Smokeless tobacco: Not on file  . Alcohol Use: 1.2 oz/week    2 Cans of beer per week     Comment: occ    Family history is  UTO 2/2 dementia  Review of Systems  UTO 2/2 dementia     Filed Vitals:   09/02/15 1235  BP: 105/63  Pulse: 88  Temp: 98.5 F (36.9 C)  Resp: 18    SpO2 Readings from Last 1 Encounters:  08/30/15 98%        Physical Exam  GENERAL APPEARANCE: Alert,   No acute distress.  SKIN: No diaphoresis rash HEAD: Normocephalic, atraumatic  EYES: Conjunctiva/lids clear. Pupils round, reactive. EOMs intact.  EARS: External exam WNL, canals clear. Hearing grossly normal.  NOSE: No deformity or discharge.  MOUTH/THROAT: Lips w/o lesions  RESPIRATORY: Breathing is even, unlabored. Lung sounds are clear   CARDIOVASCULAR: Heart RRR no murmurs, rubs or gallops. No peripheral edema.   GASTROINTESTINAL: Abdomen is soft, non-tender, not distended w/ normal bowel sounds. GENITOURINARY: Bladder non tender, not distended  MUSCULOSKELETAL: No abnormal joints or musculature NEUROLOGIC:  Cranial nerves 2-12 grossly intact. Moves all extremities  PSYCHIATRIC: disoriented , no behavioral issues  Patient Active Problem List   Diagnosis Date Noted  . Sepsis (HCC) 09/16/2015  . Neuropathy due to secondary diabetes (HCC) 09/16/2015  . Anxiety 09/16/2015  . Rectal bleed 08/26/2015  . Rectal bleeding 08/26/2015  . History of DVT (deep vein thrombosis)  08/26/2015  . Constipation 08/26/2015  . Anemia 08/26/2015  . Infection of urinary tract 08/26/2015  . Melena 08/25/2015  . FTT (failure to thrive) in adult 07/05/2015  . Insomnia 03/25/2015  . Polyneuropathy due to type 2 diabetes mellitus (HCC) 03/25/2015  . Acute deep vein thrombosis (DVT) of left femoral vein (HCC) 02/22/2015  . Diabetes mellitus with neurological manifestations (HCC) 10/22/2014  . 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 Primary osteoarthritis of both knees 06/03/2014  . Chronic pain 05/13/2014  . Alcohol abuse   . Dementia with behavioral problem   . Dysphagia   . 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 (  HCC)     CBC    Component Value Date/Time   WBC 9.4 08/28/2015 0529   RBC 4.05 08/28/2015 0529   HGB 11.1* 08/28/2015 0529   HCT 34.0* 08/28/2015 0529   PLT 282 08/28/2015 0529   MCV 84.0 08/28/2015 0529   LYMPHSABS 3.9 08/25/2015 1941   MONOABS 0.8 08/25/2015 1941   EOSABS 0.4 08/25/2015 1941   BASOSABS 0.0 08/25/2015 1941    CMP     Component Value Date/Time   NA 140 08/28/2015 0529   NA 143 07/14/2015   K 4.0 08/28/2015 0529   CL 104 08/28/2015 0529   CO2 26 08/28/2015 0529   GLUCOSE 110* 08/28/2015 0529   BUN 11 08/28/2015 0529   BUN 10 07/14/2015   CREATININE 0.66 08/28/2015 0529   CREATININE 0.6 07/14/2015   CALCIUM 9.3 08/28/2015 0529   PROT 8.1 08/25/2015 1941   ALBUMIN 3.7 08/25/2015 1941   AST 26 08/25/2015 1941   ALT 20 08/25/2015 1941   ALKPHOS 104 08/25/2015 1941   BILITOT 0.3 08/25/2015 1941   GFRNONAA >60 08/28/2015 0529   GFRAA >60 08/28/2015 0529    No results found for: HGBA1C   Ct Abdomen Pelvis W Contrast  08/25/2015  CLINICAL DATA:  60 year old female with rectal bleeding EXAM: CT ABDOMEN AND PELVIS WITH CONTRAST TECHNIQUE: Multidetector CT imaging of the abdomen and pelvis was performed using the standard protocol following bolus administration of intravenous contrast. CONTRAST:  ISOVUE-300 IOPAMIDOL  (ISOVUE-300) INJECTION 61% COMPARISON:  None. FINDINGS: The visualized lung bases are clear. Caps no intra-abdominal free air or free fluid. There multiple stones within the gallbladder. No pericholecystic fluid. Ultrasound may provide better evaluation of gallbladder. The liver appears unremarkable. There is atrophy of the pancreas with dilatation of the main pancreatic duct. Innumerable coarse calcification of the pancreas noted findings sequela of chronic pancreatitis. No definite peripancreatic stranding identified to suggest acute pancreatitis. Correlation with pancreatic enzymes recommended. The liver spleen, adrenal glands, kidneys, visualized ureters appear unremarkable. There is diffusely thickened and trabecular appearance of the urinary bladder likely related to chronic cystitis or chronic bladder outlet obstruction. Correlation with urinalysis recommended to exclude superimposed acute the TI. Underlying infiltrative lesion is less likely. Cystoscopy may provide additional information if clinically indicated. The uterus is anteverted and grossly unremarkable. Copious amount of dense stool noted throughout the colon. There is no evidence of bowel obstruction or active inflammation. Normal appendix. The abdominal aorta and IVC appear unremarkable. No portal venous gas identified. There is a circumaortic left renal vein anatomy. There is no adenopathy. Midline vertical anterior pelvic wall incisional scar. There is diastases of anterior abdominal wall musculature at the level of the umbilicus. The abdominal wall soft tissues are otherwise unremarkable. There is osteopenia with degenerative changes of the spine. 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 No acute fracture. IMPRESSION: Constipation. No evidence of bowel obstruction or inflammation. Normal appendix. Cholelithiasis. Changes of chronic pancreatitis. Correlation with pancreatic enzymes recommended to exclude acute pancreatitis.  Diffusely thickened and trabecular appearance of the bladder likely related to chronic bladder outlet obstruction or chronic UTI. Electronically Signed   By: Elgie Collard M.D.   On: 08/25/2015 23:28   Dg Knee Left Port  08/26/2015  CLINICAL DATA:  60 year old female with chronic knee pain. EXAM: PORTABLE LEFT KNEE - 1-2 VIEW COMPARISON:  Radiograph dated 08/04/2014 FINDINGS: There is no acute fracture or dislocation. The bones are osteopenic. There is narrowing of the medial and lateral compartments of the  knee compatible with chronic osteoarthritic changes. Stable appearing sclerotic changes in the proximal tibia may be related to areas of infarct. There is no joint effusion. Mild subcutaneous soft tissue edema. IMPRESSION: No acute fracture or dislocation. Electronically Signed   By: Elgie CollardArash  Radparvar M.D.   On: 08/26/2015 03:38    Not all labs, radiology exams or other studies done during hospitalization come through on my EPIC note; however they are reviewed by me.    Assessment and Plan  Rectal bleed Acute. Patient with reports of one large bloody bowel movement yesterday morning.  Found to be guaiac positive on presentation.  Hemoglobin continues to be stable at 11.2. This is mildly rectal bleeding happened at the same time patient was having Xarelto and suffers from constipation. No rectal bleeding anymore, this is resolved, please note that Xarelto was held during this hospital stay.   Sepsis (HCC) secondary to urinary tract infection WBC elevated at 12.8, lactic acid 3.33, heart rate 121, and positive urinalysis on admission. Started on Rocephin in the ED, evidence of end organ damage with lactic acid elevated at 3.3 on admission. Repeat lactic acid in a.m. was 1.1. SNF - cont  Ceftin to complete 5 days of antibiotics.   Infection of urinary tract  positive urinalysis on admission. Started on Rocephin in the ED, Final urine culture was still pending at the time of  discharge SNF - cont  Ceftin to complete 5 days of antibiotics.   Anemia Hemoglobin appears stable at 11.2, did not require blood transfusion SNF - will f/u CBC  Constipation Acute on chronic. Patient notes bouts with constipation. Physical exam reveals a mildly distended abdomen. CT scan showing moderate constipation with no signs of obstruction. SNF - Started on MiraLAX and Senokot; will cont    History of DVT (deep vein thrombosis) In lieu of the patient recently reporting a fall - X-ray of the left knee without findings, patient has history of DVT in the left femoral vein. 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

## 2015-09-14 LAB — HM DIABETES EYE EXAM

## 2015-09-16 ENCOUNTER — Encounter: Payer: Self-pay | Admitting: Internal Medicine

## 2015-09-16 DIAGNOSIS — F419 Anxiety disorder, unspecified: Secondary | ICD-10-CM | POA: Insufficient documentation

## 2015-09-16 DIAGNOSIS — A419 Sepsis, unspecified organism: Secondary | ICD-10-CM | POA: Insufficient documentation

## 2015-09-16 DIAGNOSIS — E1340 Other specified diabetes mellitus with diabetic neuropathy, unspecified: Secondary | ICD-10-CM | POA: Insufficient documentation

## 2015-09-16 NOTE — Assessment & Plan Note (Signed)
In lieu of the patient recently reporting a fall - X-ray of the left knee without findings, patient has history of DVT in the left femoral vein. SNF - xarelto had been stopped, pt onit for > 6 months; will not restart

## 2015-09-16 NOTE — Assessment & Plan Note (Signed)
SNF - controlled , cont ativan

## 2015-09-16 NOTE — Assessment & Plan Note (Signed)
Conrolled ;cont zyprexa

## 2015-09-16 NOTE — Assessment & Plan Note (Signed)
SNF - not stated as uncontrolled;cont neurontin

## 2015-09-16 NOTE — Assessment & Plan Note (Signed)
positive urinalysis on admission. Started on Rocephin in the ED, Final urine culture was still pending at the time of discharge SNF - cont  Ceftin to complete 5 days of antibiotics.

## 2015-09-16 NOTE — Assessment & Plan Note (Signed)
secondary to urinary tract infection WBC elevated at 12.8, lactic acid 3.33, heart rate 121, and positive urinalysis on admission. Started on Rocephin in the ED, evidence of end organ damage with lactic acid elevated at 3.3 on admission. Repeat lactic acid in a.m. was 1.1. SNF - cont  Ceftin to complete 5 days of antibiotics.

## 2015-09-16 NOTE — Assessment & Plan Note (Signed)
SNF Cont Metformin; CBGs q am

## 2015-09-16 NOTE — Assessment & Plan Note (Signed)
Acute on chronic. Patient notes bouts with constipation. Physical exam reveals a mildly distended abdomen. CT scan showing moderate constipation with no signs of obstruction. SNF - Started on MiraLAX and Senokot; will cont

## 2015-09-16 NOTE — Assessment & Plan Note (Signed)
Acute. Patient with reports of one large bloody bowel movement yesterday morning.  Found to be guaiac positive on presentation.  Hemoglobin continues to be stable at 11.2. This is mildly rectal bleeding happened at the same time patient was having Xarelto and suffers from constipation. No rectal bleeding anymore, this is resolved, please note that Xarelto was held during this hospital stay.

## 2015-09-16 NOTE — Assessment & Plan Note (Signed)
Hemoglobin appears stable at 11.2, did not require blood transfusion SNF - will f/u CBC

## 2015-09-17 ENCOUNTER — Encounter: Payer: Self-pay | Admitting: Adult Health

## 2015-09-28 LAB — HEMOGLOBIN A1C: HEMOGLOBIN A1C: 6.9

## 2015-09-28 LAB — HEPATIC FUNCTION PANEL
ALK PHOS: 111 U/L (ref 25–125)
ALT: 26 U/L (ref 7–35)
AST: 21 U/L (ref 13–35)
BILIRUBIN, TOTAL: 0.2 mg/dL

## 2015-09-28 LAB — BASIC METABOLIC PANEL
BUN: 16 mg/dL (ref 4–21)
CREATININE: 0.6 mg/dL (ref 0.5–1.1)
Glucose: 187 mg/dL
Potassium: 3.8 mmol/L (ref 3.4–5.3)
Sodium: 142 mmol/L (ref 137–147)

## 2015-09-28 LAB — TSH: TSH: 1.46 u[IU]/mL (ref 0.41–5.90)

## 2015-09-28 LAB — LIPID PANEL
CHOLESTEROL: 143 mg/dL (ref 0–200)
HDL: 64 mg/dL (ref 35–70)
LDL Cholesterol: 48 mg/dL
Triglycerides: 151 mg/dL (ref 40–160)

## 2015-09-29 ENCOUNTER — Encounter: Payer: Self-pay | Admitting: Adult Health

## 2015-09-29 ENCOUNTER — Non-Acute Institutional Stay (SKILLED_NURSING_FACILITY): Payer: Medicaid Other | Admitting: Adult Health

## 2015-09-29 DIAGNOSIS — R1310 Dysphagia, unspecified: Secondary | ICD-10-CM | POA: Diagnosis not present

## 2015-09-29 DIAGNOSIS — E1142 Type 2 diabetes mellitus with diabetic polyneuropathy: Secondary | ICD-10-CM | POA: Diagnosis not present

## 2015-09-29 DIAGNOSIS — F0391 Unspecified dementia with behavioral disturbance: Secondary | ICD-10-CM

## 2015-09-29 DIAGNOSIS — I82412 Acute embolism and thrombosis of left femoral vein: Secondary | ICD-10-CM | POA: Diagnosis not present

## 2015-09-29 DIAGNOSIS — E1340 Other specified diabetes mellitus with diabetic neuropathy, unspecified: Secondary | ICD-10-CM | POA: Diagnosis not present

## 2015-09-29 DIAGNOSIS — F10959 Alcohol use, unspecified with alcohol-induced psychotic disorder, unspecified: Secondary | ICD-10-CM

## 2015-09-29 DIAGNOSIS — M170 Bilateral primary osteoarthritis of knee: Secondary | ICD-10-CM | POA: Diagnosis not present

## 2015-09-29 DIAGNOSIS — F03918 Unspecified dementia, unspecified severity, with other behavioral disturbance: Secondary | ICD-10-CM

## 2015-09-29 NOTE — Progress Notes (Signed)
Patient ID: Sarah Mckay, female   DOB: [birth year], 60 y.o.   MRN: [id number]    Facility:  [facility]     CODE STATUS: Full Code   No Known Allergies  Chief Complaint  Patient presents with  . Medical Management of Chronic Issues    Follow up    HPI:  She is a long term resident of this facility being seen for the management of her chronic illnesses. She is unable to fully participate in the hpi or ros; but does tell me that she is feeling good. There are no nursing concerns at this time.   Past Medical History  Diagnosis Date  . Seizures (HCC)   . Alcohol abuse   . Diabetes mellitus without complication (HCC)   . Dementia with behavioral problem   . Arthritis   . Dysphagia     and aspiration risk  . 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 Acute deep vein thrombosis (DVT) of left femoral vein (HCC) 02/22/2015    History reviewed. No pertinent past surgical history.  Social History   Social History  . Marital Status: Unknown    Spouse Name: N/A  . Number of Children: N/A  . Years of Education: N/A   Occupational History  . Not on file.   Social History Main Topics  . Smoking status: Current Every Day Smoker -- 0.50 packs/day for 15 years    Types: Cigarettes  . Smokeless tobacco: Not on file  . Alcohol Use: 1.2 oz/week    2 Cans of beer per week     Comment: occ  . Drug Use: No  . Sexual Activity: Not Currently   Other Topics Concern  . Not on file   Social History Narrative   ** Merged History Encounter **       History reviewed. No pertinent family history.    VITAL SIGNS BP 145/91 mmHg  Pulse 82  Temp(Src) 98.2 F (36.8 C) (Oral)  Resp 16  Ht  (1.676 m)  Wt 154 lb (69.854 kg)  BMI 24.87 kg/m2  SpO2 97%  LMP 03/25/2015 (Approximate)  Patient's Medications  New Prescriptions   No medications on file  Previous Medications   AMINO ACIDS-PROTEIN HYDROLYS (FEEDING SUPPLEMENT, PRO-STAT SUGAR FREE 64,) LIQD    Take 30 mLs by mouth 2  (two) times daily. 0800 and 1700   ASPIRIN 81 MG TABLET    Take 81 mg by mouth daily.   CHOLECALCIFEROL (VITAMIN D) 1000 UNITS TABLET    Take 1,000 Units by mouth daily.   DICLOFENAC SODIUM (VOLTAREN) 1 % GEL    Apply 2 g topically 4 (four) times daily. To both knees   GABAPENTIN (NEURONTIN) 300 MG CAPSULE    Take 3 capsules (900 mg total) by mouth 3 (three) times daily.   LORAZEPAM (ATIVAN) 0.5 MG TABLET    Take 1 tablet (0.5 mg total) by mouth every 8 (eight) hours as needed for anxiety.   MELATONIN 3 MG TABS    Take 3 mg by mouth at bedtime as needed (sleep).    METFORMIN (GLUCOPHAGE-XR) 500 MG 24 HR TABLET    Take 500 mg by mouth daily with breakfast.   MULTIPLE VITAMIN (MULTIVITAMIN WITH MINERALS) TABS TABLET    Take 2 tablets by mouth at bedtime.    OLANZAPINE (ZYPREXA) 7.5 MG TABLET    Take 7.5 mg by mouth at bedtime.   POLYETHYLENE GLYCOL (MIRALAX / GLYCOLAX) PACKET    Take 17 g by  mouth daily.   SENNA (SENOKOT) 8.6 MG TABS TABLET    Take 2 tablets (17.2 mg total) by mouth daily.   THIAMINE 100 MG TABLET    Take 100 mg by mouth daily.   TRAMADOL (ULTRAM) 50 MG TABLET    Take 1 tablet (50 mg total) by mouth 3 (three) times daily.  Modified Medications   No medications on file  Discontinued Medications     SIGNIFICANT DIAGNOSTIC EXAMS  Not a candidate for dexa; mammogram or colonoscopy    10-02-14: left leg doppler: +dvt  10-08-14: bilateral hip /pelvic fracture: right hip with marked osteoarthritis  04-23-15: right distal ulna and forearm: no acute fracture    LABS REVIEWED:   11-06-14: pre-albumin 7  12-11-14; hgb a1c 7.3 07-14-15: glucose 141; bun 10.3; creat 0.58; k+ 4.1; na++143   08-25-15: wbc 12.8; hgb 11.9; hct 36.5; mcv 82.4; plt 299; glucose 99; bun 20; creat 0.62; k+ 4.3;na++142; liver normal albumin 3.7; urine culture: e-coli: ceftin; guaiac stool: +  08-28-15: wbc 9.4; hgb 11.1; hct 34.0; mcv 84.0; plt 282; glucose 110; bun 11; creat 0.66; k+ 4.0; na++140      Review of Systems Unable to perform ROS: dementia    Physical Exam Constitutional: No distress.  Frail   Neck: Neck supple. No JVD present. No thyromegaly present.  Cardiovascular: Normal rate, regular rhythm and intact distal pulses.   Respiratory: Effort normal and breath sounds normal. No respiratory distress.  GI: Soft. Bowel sounds are normal. She exhibits no distension. There is no tenderness.  Musculoskeletal: She exhibits no edema.  Is able to move all extremities   Neurological: She is alert.  Skin: Skin is warm and dry. She is not diaphoretic.    ASSESSMENT/ PLAN:  1. Diabetes: she is presently stable her hgb a1c 7.3; will continue metformin xr 500 mg daily;   and will monitor  Urine for micro-albumin is 7.4; is on lisinopril 2.5 mg daily   2. Dysphagia: no signs of aspiration present; will not make changes in her current plan of care and will monitor her status.   3. 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   4. Psychosis; due to alcohol: she is presently without change in status; will continue zyprexa 7.5 mg daily; thiamine 100 mg daily;  will monitor  5. 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 Her current weight is 150 pounds.    6. DVT left leg:  since 10-02-14. will continue  asa 81 mg daily and will monitor  7. Constipation: will miralax daily and senna 2 tabs daily    Will check hgb a1c and urine micro-albumin    Synthia Innocenteborah Lani Havlik NP [facility]  Contact [phone] Monday through Friday 8am- 5pm  After hours call [phone]

## 2015-09-30 LAB — HEMOGLOBIN A1C: HEMOGLOBIN A1C: 6.8

## 2015-10-28 ENCOUNTER — Encounter: Payer: Self-pay | Admitting: Internal Medicine

## 2015-10-28 ENCOUNTER — Non-Acute Institutional Stay (SKILLED_NURSING_FACILITY): Payer: Medicaid Other | Admitting: Internal Medicine

## 2015-10-28 DIAGNOSIS — F0391 Unspecified dementia with behavioral disturbance: Secondary | ICD-10-CM | POA: Diagnosis not present

## 2015-10-28 DIAGNOSIS — Z86718 Personal history of other venous thrombosis and embolism: Secondary | ICD-10-CM

## 2015-10-28 DIAGNOSIS — F10959 Alcohol use, unspecified with alcohol-induced psychotic disorder, unspecified: Secondary | ICD-10-CM | POA: Diagnosis not present

## 2015-10-28 DIAGNOSIS — F03918 Unspecified dementia, unspecified severity, with other behavioral disturbance: Secondary | ICD-10-CM

## 2015-10-28 NOTE — Progress Notes (Signed)
MRN: [id number] Name: Sarah Mckay  Sex: female Age: 60 y.o. DOB: August 03, 1955  [facility] #:  Facility/Room:[facility] / 204 B Level Of Care: SNF Provider: Randon Goldsmith. Lyn Hollingshead, MD Emergency Contacts: Extended Emergency Contact Information Primary Emergency Contact: Garling,Marcus Address: [address]          [place], [place] [address] [place] Home Phone: [phone] Mobile Phone: [phone] Relation: Brother Secondary Emergency Contact: Dewaine Oats [place] Home Phone: [phone] Relation: Niece  Code Status: Full Code  Allergies: Review of patient's allergies indicates no known allergies.  Chief Complaint  Patient presents with  . Medical Management of Chronic Issues    Routine Visit    HPI: Patient is 60 y.o. female who is being seem for routine issues of psychosis, dementia and h/o DVT.  Past Medical History  Diagnosis Date  . Seizures (HCC)   . Alcohol abuse   . Diabetes mellitus without complication (HCC)   . Dementia with behavioral problem   . Arthritis   . Dysphagia     and aspiration risk  . 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 Acute deep vein thrombosis (DVT) of left femoral vein (HCC) 02/22/2015    History reviewed. No pertinent past surgical history.    Medication List       This list is accurate as of: 10/28/15 11:59 PM.  Always use your most recent med list.               aspirin 81 MG tablet  Take 81 mg by mouth daily.     cholecalciferol 1000 units tablet  Commonly known as:  VITAMIN D  Take 1,000 Units by mouth daily. Reported on 10/28/2015     diclofenac sodium 1 % Gel  Commonly known as:  VOLTAREN  Apply 2 g topically 4 (four) times daily. To both knees     feeding supplement (PRO-STAT SUGAR FREE 64) Liqd  Take 30 mLs by mouth 2 (two) times daily. 0800 and 1700     gabapentin 300 MG capsule  Commonly known as:  NEURONTIN  Take 3 capsules (900 mg total) by mouth 3 (three) times daily.      LORazepam 0.5 MG tablet  Commonly known as:  ATIVAN  Take 1 tablet (0.5 mg total) by mouth every 8 (eight) hours as needed for anxiety.     Melatonin 3 MG Tabs  Take 3 mg by mouth at bedtime as needed (sleep).     metFORMIN 500 MG 24 hr tablet  Commonly known as:  GLUCOPHAGE-XR  Take 500 mg by mouth daily with breakfast.     multivitamin with minerals Tabs tablet  Take 2 tablets by mouth at bedtime.     OLANZapine 7.5 MG tablet  Commonly known as:  ZYPREXA  Take 7.5 mg by mouth at bedtime.     polyethylene glycol packet  Commonly known as:  MIRALAX / GLYCOLAX  Take 17 g by mouth daily.     senna 8.6 MG Tabs tablet  Commonly known as:  SENOKOT  Take 2 tablets (17.2 mg total) by mouth daily.     thiamine 100 MG tablet  Take 100 mg by mouth daily.     traMADol 50 MG tablet  Commonly known as:  ULTRAM  Take 1 tablet (50 mg total) by mouth 3 (three) times daily.        No orders of the defined types were placed in this encounter.    Immunization History  Administered Date(s) Administered  . Influenza-Unspecified 07/06/2015  . Pneumococcal-Unspecified 06/16/2014  . Tdap 01/18/2014    Social History  Substance Use Topics  . Smoking status: Current Every Day Smoker -- 0.50 packs/day for 15 years    Types: Cigarettes  . Smokeless tobacco: Not on file  . Alcohol Use: 1.2 oz/week    2 Cans of beer per week     Comment: occ    Review of Systems  UTO 2/2 dementia    Filed Vitals:   10/28/15 1336  BP: 123/72  Pulse: 66  Temp: 96.8 F (36 C)  Resp: 18    Physical Exam  GENERAL APPEARANCE: Alert,  No acute distress frail SKIN: No diaphoresis rash HEENT: Unremarkable RESPIRATORY: Breathing is even, unlabored. Lung sounds are clear   CARDIOVASCULAR: Heart RRR no murmurs, rubs or gallops. No peripheral edema  GASTROINTESTINAL: Abdomen is soft, non-tender, not distended w/ normal bowel sounds.  GENITOURINARY: Bladder non tender, not distended   MUSCULOSKELETAL: No abnormal joints or musculature NEUROLOGIC: Cranial nerves 2-12 grossly intact. Moves all extremities PSYCHIATRIC: dementia, no behavioral issues  Patient Active Problem List   Diagnosis Date Noted  . Sepsis (HCC) 09/16/2015  . Neuropathy due to secondary diabetes (HCC) 09/16/2015  . Anxiety 09/16/2015  . Rectal bleed 08/26/2015  . Rectal bleeding 08/26/2015  . History of DVT (deep vein thrombosis) 08/26/2015  . Constipation 08/26/2015  . Anemia 08/26/2015  . Infection of urinary tract 08/26/2015  . Melena 08/25/2015  . FTT (failure to thrive) in adult 07/05/2015  . Insomnia 03/25/2015  . Polyneuropathy due to type 2 diabetes mellitus (HCC) 03/25/2015  . Acute deep vein thrombosis (DVT) of left femoral vein (HCC) 02/22/2015  . Diabetes mellitus with neurological manifestations (HCC) 10/22/2014  . Primary osteoarthritis of both knees 06/03/2014  . Chronic pain 05/13/2014  . Alcohol abuse   . Dementia with behavioral problem   . Dysphagia   . 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 [facility])     CBC    Component Value Date/Time   WBC 9.4 08/28/2015 0529   RBC 4.05 08/28/2015 0529   HGB 11.1* 08/28/2015 0529   HCT 34.0* 08/28/2015 0529   PLT 282 08/28/2015 0529   MCV 84.0 08/28/2015 0529   LYMPHSABS 3.9 08/25/2015 1941   MONOABS 0.8 08/25/2015 1941   EOSABS 0.4 08/25/2015 1941   BASOSABS 0.0 08/25/2015 1941    CMP     Component Value Date/Time   NA 142 09/28/2015   NA 140 08/28/2015 0529   K 3.8 09/28/2015   CL 104 08/28/2015 0529   CO2 26 08/28/2015 0529   GLUCOSE 110* 08/28/2015 0529   BUN 16 09/28/2015   BUN 11 08/28/2015 0529   CREATININE 0.6 09/28/2015   CREATININE 0.66 08/28/2015 0529   CALCIUM 9.3 08/28/2015 0529   PROT 8.1 08/25/2015 1941   ALBUMIN 3.7 08/25/2015 1941   AST 21 09/28/2015   ALT 26 09/28/2015   ALKPHOS 111 09/28/2015   BILITOT 0.3 08/25/2015 1941   GFRNONAA >60 08/28/2015 0529   GFRAA >60 08/28/2015 0529    Assessment and  Plan  Psychosis; due to alcohol: she is presently without change in status; will continue zyprexa 7.5 mg daily; thiamine 100 mg daily; will monitor   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 Her current weight is 150 pounds.    DVT left leg: since 10-02-14. will continue asa  81 mg daily and will monitor   Tate Jerkins D. Lyn HollingsheadAlexander, MD

## 2015-11-14 ENCOUNTER — Encounter: Payer: Self-pay | Admitting: Internal Medicine

## 2015-12-02 ENCOUNTER — Non-Acute Institutional Stay (SKILLED_NURSING_FACILITY): Payer: Medicaid Other | Admitting: Adult Health

## 2015-12-02 DIAGNOSIS — E1142 Type 2 diabetes mellitus with diabetic polyneuropathy: Secondary | ICD-10-CM

## 2015-12-02 DIAGNOSIS — R627 Adult failure to thrive: Secondary | ICD-10-CM

## 2015-12-02 DIAGNOSIS — M170 Bilateral primary osteoarthritis of knee: Secondary | ICD-10-CM

## 2015-12-02 DIAGNOSIS — R1310 Dysphagia, unspecified: Secondary | ICD-10-CM | POA: Diagnosis not present

## 2015-12-08 ENCOUNTER — Non-Acute Institutional Stay (SKILLED_NURSING_FACILITY): Payer: Medicaid Other | Admitting: Adult Health

## 2015-12-08 ENCOUNTER — Encounter: Payer: Self-pay | Admitting: Adult Health

## 2015-12-08 DIAGNOSIS — R600 Localized edema: Secondary | ICD-10-CM

## 2015-12-08 NOTE — Progress Notes (Signed)
Patient ID: Sarah Mckay, female   DOB: 1956-03-24, 60 y.o.   MRN: [id number]   Location:   [facility] Room Number: 204-B Place of Service:  SNF (31)   CODE STATUS: Full Code  No Known Allergies  Chief Complaint  Patient presents with  . Acute Visit    HPI:    Past Medical History  Diagnosis Date  . Seizures (HCC)   . Alcohol abuse   . Diabetes mellitus without complication (HCC)   . Dementia with behavioral problem   . Arthritis   . Dysphagia     and aspiration risk  . 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 Acute deep vein thrombosis (DVT) of left femoral vein (HCC) 02/22/2015    History reviewed. No pertinent past surgical history.  Social History   Social History  . Marital Status: Unknown    Spouse Name: N/A  . Number of Children: N/A  . Years of Education: N/A   Occupational History  . Not on file.   Social History Main Topics  . Smoking status: Current Every Day Smoker -- 0.50 packs/day for 15 years    Types: Cigarettes  . Smokeless tobacco: Not on file  . Alcohol Use: 1.2 oz/week    2 Cans of beer per week     Comment: occ  . Drug Use: No  . Sexual Activity: Not Currently   Other Topics Concern  . Not on file   Social History Narrative   ** Merged History Encounter **       History reviewed. No pertinent family history.    VITAL SIGNS BP 120/72 mmHg  Pulse 76  Temp(Src) 97.6 F (36.4 C) (Oral)  Resp 18  Ht  (1.676 m)  Wt 160 lb (72.576 kg)  BMI 25.84 kg/m2  SpO2 97%  LMP 03/25/2015 (Approximate)  Patient's Medications  New Prescriptions   No medications on file  Previous Medications   AMINO ACIDS-PROTEIN HYDROLYS (FEEDING SUPPLEMENT, PRO-STAT SUGAR FREE 64,) LIQD    Take 30 mLs by mouth 2 (two) times daily. 0800 and 1700   ASPIRIN 81 MG TABLET    Take 81 mg by mouth daily.   BACITRACIN-NEOMYCIN-POLYMYXIN-HYDROCORTISONE (CORTISPORIN) 1 % OINTMENT    Apply 1 application topically daily.   CHOLECALCIFEROL  (VITAMIN D) 1000 UNITS TABLET    Take 1,000 Units by mouth daily. Reported on 10/28/2015   DICLOFENAC SODIUM (VOLTAREN) 1 % GEL    Apply 2 g topically 4 (four) times daily. To both knees   GABAPENTIN (NEURONTIN) 300 MG CAPSULE    Take 3 capsules (900 mg total) by mouth 3 (three) times daily.   LORAZEPAM (ATIVAN) 0.5 MG TABLET    Take 1 tablet (0.5 mg total) by mouth every 8 (eight) hours as needed for anxiety.   MELATONIN 3 MG TABS    Take 3 mg by mouth at bedtime as needed (sleep).    METFORMIN (GLUCOPHAGE-XR) 500 MG 24 HR TABLET    Take 500 mg by mouth daily with breakfast.   MULTIPLE VITAMIN (MULTIVITAMIN WITH MINERALS) TABS TABLET    Take 2 tablets by mouth at bedtime.    OLANZAPINE (ZYPREXA) 7.5 MG TABLET    Take 7.5 mg by mouth at bedtime.   POLYETHYLENE GLYCOL (MIRALAX / GLYCOLAX) PACKET    Take 17 g by mouth daily.   SENNA (SENOKOT) 8.6 MG TABS TABLET    Take 2 tablets (17.2 mg total) by mouth daily.   THIAMINE 100 MG TABLET  Take 100 mg by mouth daily.   TRAMADOL (ULTRAM) 50 MG TABLET    Take 1 tablet (50 mg total) by mouth 3 (three) times daily.  Modified Medications   No medications on file  Discontinued Medications   No medications on file     SIGNIFICANT DIAGNOSTIC EXAMS  Not a candidate for dexa; mammogram or colonoscopy    10-02-14: left leg doppler: +dvt  10-08-14: bilateral hip /pelvic fracture: right hip with marked osteoarthritis  04-23-15: right distal ulna and forearm: no acute fracture    LABS REVIEWED:   11-06-14: pre-albumin 7  12-11-14; hgb a1c 7.3 07-14-15: glucose 141; bun 10.3; creat 0.58; k+ 4.1; na++143   08-25-15: wbc 12.8; hgb 11.9; hct 36.5; mcv 82.4; plt 299; glucose 99; bun 20; creat 0.62; k+ 4.3;na++142; liver normal albumin 3.7; urine culture: e-coli: ceftin; guaiac stool: +  08-28-15: wbc 9.4; hgb 11.1; hct 34.0; mcv 84.0; plt 282; glucose 110; bun 11; creat 0.66; k+ 4.0; na++140     Review of Systems Unable to perform ROS: dementia     Physical Exam Constitutional: No distress.  Frail   Neck: Neck supple. No JVD present. No thyromegaly present.  Cardiovascular: Normal rate, regular rhythm and intact distal pulses.   Respiratory: Effort normal and breath sounds normal. No respiratory distress.  GI: Soft. Bowel sounds are normal. She exhibits no distension. There is no tenderness.  Musculoskeletal: She exhibits no edema.  Is able to move all extremities   Neurological: She is alert.  Skin: Skin is warm and dry. She is not diaphoretic.    ASSESSMENT/ PLAN:  1. Diabetes: she is presently stable her hgb a1c 7.3; will continue metformin xr 500 mg daily;   and will monitor  Urine for micro-albumin is 7.4; is on lisinopril 2.5 mg daily   2. Dysphagia: no signs of aspiration present; will not make changes in her current plan of care and will monitor her status.   3. 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   4. Psychosis; due to alcohol: she is presently without change in status; will continue zyprexa 7.5 mg daily; thiamine 100 mg daily;  will monitor  5. 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 Her current weight is 150 pounds.    6. DVT left leg:  since 10-02-14. will continue  asa 81 mg daily and will monitor  7. Constipation: will miralax daily and senna 2 tabs daily    Will check hgb a1c and urine micro-albumin            Synthia Innocenteborah Green NP [facility]  Contact [phone] Monday through Friday 8am- 5pm  After hours call [phone]

## 2015-12-15 LAB — BASIC METABOLIC PANEL
BUN: 17 mg/dL (ref 4–21)
Creatinine: 0.5 mg/dL (ref 0.5–1.1)
GLUCOSE: 112 mg/dL
POTASSIUM: 4 mmol/L (ref 3.4–5.3)
SODIUM: 141 mmol/L (ref 137–147)

## 2015-12-16 ENCOUNTER — Encounter: Payer: Self-pay | Admitting: Adult Health

## 2015-12-16 NOTE — Progress Notes (Signed)
This encounter was created in error - please disregard.

## 2015-12-16 NOTE — Progress Notes (Addendum)
Location:   [facility]    Place of Service:  SNF (31)   CODE STATUS: full code   No Known Allergies  Chief Complaint  Patient presents with  . Medical Management of Chronic Issues    HPI:  She is a long term resident of this facility being seen for the management of her chronic illnesses. Overall her status is stable. She does get out of bed daily and participates in activities. She is unable to fully participate in the hpi or ros. There are no nursing concerns at this time.   Past Medical History  Diagnosis Date  . Seizures (HCC)   . Alcohol abuse   . Diabetes mellitus without complication (HCC)   . Dementia with behavioral problem   . Arthritis   . Dysphagia     and aspiration risk  . 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 Acute deep vein thrombosis (DVT) of left femoral vein (HCC) 02/22/2015    No past surgical history on file.  Social History   Social History  . Marital Status: Unknown    Spouse Name: N/A  . Number of Children: N/A  . Years of Education: N/A   Occupational History  . Not on file.   Social History Main Topics  . Smoking status: Current Every Day Smoker -- 0.50 packs/day for 15 years    Types: Cigarettes  . Smokeless tobacco: Not on file  . Alcohol Use: 1.2 oz/week    2 Cans of beer per week     Comment: occ  . Drug Use: No  . Sexual Activity: Not Currently   Other Topics Concern  . Not on file   Social History Narrative   ** Merged History Encounter **       No family history on file.    VITAL SIGNS BP 121/73 mmHg  Pulse 100  Resp 18  Ht 5\' 6"  (1.676 m)  Wt 154 lb (69.854 kg)  BMI 24.87 kg/m2  SpO2 98%  LMP 03/25/2015 (Approximate)  Patient's Medications  New Prescriptions   No medications on file  Previous Medications   AMINO ACIDS-PROTEIN HYDROLYS (FEEDING SUPPLEMENT, PRO-STAT SUGAR FREE 64,) LIQD    Take 30 mLs by mouth 2 (two) times daily. 0800 and 1700   ASPIRIN 81 MG TABLET    Take 81 mg by mouth daily.   BACITRACIN-NEOMYCIN-POLYMYXIN-HYDROCORTISONE (CORTISPORIN) 1 % OINTMENT    Apply 1 application topically daily.   CHOLECALCIFEROL (VITAMIN D) 1000 UNITS TABLET    Take 1,000 Units by mouth daily. Reported on 10/28/2015   DICLOFENAC SODIUM (VOLTAREN) 1 % GEL    Apply 2 g topically 4 (four) times daily. To both knees   GABAPENTIN (NEURONTIN) 300 MG CAPSULE    Take 3 capsules (900 mg total) by mouth 3 (three) times daily.   LORAZEPAM (ATIVAN) 0.5 MG TABLET    Take 1 tablet (0.5 mg total) by mouth every 8 (eight) hours as needed for anxiety.   MELATONIN 3 MG TABS    Take 3 mg by mouth at bedtime as needed (sleep).    METFORMIN (GLUCOPHAGE-XR) 500 MG 24 HR TABLET    Take 500 mg by mouth daily with breakfast.   MULTIPLE VITAMIN (MULTIVITAMIN WITH MINERALS) TABS TABLET    Take 2 tablets by mouth at bedtime.    OLANZAPINE (ZYPREXA) 7.5 MG TABLET    Take 7.5 mg by mouth at bedtime.   POLYETHYLENE GLYCOL (MIRALAX / GLYCOLAX) PACKET    Take 17  g by mouth daily.   SENNA (SENOKOT) 8.6 MG TABS TABLET    Take 2 tablets (17.2 mg total) by mouth daily.   THIAMINE 100 MG TABLET    Take 100 mg by mouth daily.   TRAMADOL (ULTRAM) 50 MG TABLET    Take 1 tablet (50 mg total) by mouth 3 (three) times daily.  Modified Medications   No medications on file  Discontinued Medications   No medications on file     SIGNIFICANT DIAGNOSTIC EXAMS    Not a candidate for dexa; mammogram or colonoscopy    10-02-14: left leg doppler: +dvt  10-08-14: bilateral hip /pelvic fracture: right hip with marked osteoarthritis  04-23-15: right distal ulna and forearm: no acute fracture   10-28-15: left lower extremity doppler: negative for dvt   LABS REVIEWED:   07-14-15: glucose 141; bun 10.3; creat 0.58; k+ 4.1; na++143   08-25-15: wbc 12.8; hgb 11.9; hct 36.5; mcv 82.4; plt 299; glucose 99; bun 20; creat 0.62; k+ 4.3;na++142; liver normal albumin 3.7; urine culture: e-coli: ceftin; guaiac stool: +  08-28-15: wbc 9.4; hgb 11.1; hct  34.0; mcv 84.0; plt 282; glucose 110; bun 11; creat 0.66; k+ 4.0; na++140  09-28-15; wbc 10.1; hgb 11.8; hct 39.0; mcv 83.6; plt 298; glucose 187; bun 16.1; creat 0.65; k+ 3.8; na++ 140; liver normal albumin 3.9; tsh 1.46; hgb a1c 6.9; chol 143; ldl 48; trig 151; hdl 64  01-27-46: hgb a1c 6.8     Review of Systems Unable to perform ROS: dementia    Physical Exam Constitutional: No distress.  Frail   Neck: Neck supple. No JVD present. No thyromegaly present.  Cardiovascular: Normal rate, regular rhythm and intact distal pulses.   Respiratory: Effort normal and breath sounds normal. No respiratory distress.  GI: Soft. Bowel sounds are normal. She exhibits no distension. There is no tenderness.  Musculoskeletal: She exhibits no edema.  Is able to move all extremities   Neurological: She is alert.  Skin: Skin is warm and dry. She is not diaphoretic.    ASSESSMENT/ PLAN:  1. Diabetes: she is presently stable her hgb a1c 6.8 will continue metformin xr 500 mg daily;   and will monitor  Urine for micro-albumin is 7.4; is on lisinopril 2.5 mg daily   2. Dysphagia: no signs of aspiration present; will not make changes in her current plan of care and will monitor her status.   3. 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   4. Psychosis; due to alcohol: she is presently without change in status; will continue zyprexa 7.5 mg daily; thiamine 100 mg daily;  will monitor  5. 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 Her current weight is 154 pounds.    6. DVT left leg:  since 10-02-14. will continue  asa 81 mg daily and will monitor  7.  Constipation: will miralax daily and senna 2 tabs daily     Synthia Innocent NP [facility]  Contact [phone] Monday through Friday 8am- 5pm  After hours call [phone]

## 2015-12-21 DIAGNOSIS — R600 Localized edema: Secondary | ICD-10-CM | POA: Insufficient documentation

## 2015-12-21 NOTE — Progress Notes (Signed)
Location:      Place of Service:  SNF (31)   CODE STATUS: full code  No Known Allergies  Chief Complaint  Patient presents with  . Acute Visit    edema     HPI:  Staff reports that she has bilateral ankle and lower extremity edema. She is unable to fully participate in the hpi or ros; but does tell me that she is feeling good. There are no reports of chest pain or shortness of breath   Past Medical History:  Diagnosis Date  . Acute deep vein thrombosis (DVT) of left femoral vein (HCC) 02/22/2015  . Alcohol abuse   . Arthritis   . Dementia with behavioral problem   . Diabetes mellitus without complication (HCC)   . Dysphagia    and aspiration risk  . 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 Seizures (HCC)     No past surgical history on file.  Social History   Social History  . Marital status: Unknown    Spouse name: N/A  . Number of children: N/A  . Years of education: N/A   Occupational History  . Not on file.   Social History Main Topics  . Smoking status: Current Every Day Smoker    Packs/day: 0.50    Years: 15.00    Types: Cigarettes  . Smokeless tobacco: Not on file  . Alcohol use 1.2 oz/week    2 Cans of beer per week     Comment: occ  . Drug use: No  . Sexual activity: Not Currently   Other Topics Concern  . Not on file   Social History Narrative   ** Merged History Encounter **       No family history on file.    VITAL SIGNS BP 120/76   Pulse 98   Ht 5\' 6"  (1.676 m)   Wt 154 lb (69.9 kg)   LMP 03/25/2015 (Approximate)   BMI 24.86 kg/m   Patient's Medications  New Prescriptions   No medications on file  Previous Medications   AMINO ACIDS-PROTEIN HYDROLYS (FEEDING SUPPLEMENT, PRO-STAT SUGAR FREE 64,) LIQD    Take 30 mLs by mouth 2 (two) times daily. 0800 and 1700   ASPIRIN 81 MG TABLET    Take 81 mg by mouth daily.   BACITRACIN-NEOMYCIN-POLYMYXIN-HYDROCORTISONE (CORTISPORIN) 1 % OINTMENT    Apply 1 application topically daily.   CHOLECALCIFEROL (VITAMIN D) 1000 UNITS TABLET    Take 1,000 Units by mouth daily. Reported on 10/28/2015   DICLOFENAC SODIUM (VOLTAREN) 1 % GEL    Apply 2 g topically 4 (four) times daily. To both knees   GABAPENTIN (NEURONTIN) 300 MG CAPSULE    Take 3 capsules (900 mg total) by mouth 3 (three) times daily.   LORAZEPAM (ATIVAN) 0.5 MG TABLET    Take 1 tablet (0.5 mg total) by mouth every 8 (eight) hours as needed for anxiety.   MELATONIN 3 MG TABS    Take 3 mg by mouth at bedtime as needed (sleep).    METFORMIN (GLUCOPHAGE-XR) 500 MG 24 HR TABLET    Take 500 mg by mouth daily with breakfast.   MULTIPLE VITAMIN (MULTIVITAMIN WITH MINERALS) TABS TABLET    Take 2 tablets by mouth at bedtime.    OLANZAPINE (ZYPREXA) 7.5 MG TABLET    Take 7.5 mg by mouth at bedtime.   POLYETHYLENE GLYCOL (MIRALAX / GLYCOLAX) PACKET    Take 17 g by mouth daily.   SENNA (SENOKOT) 8.6  MG TABS TABLET    Take 2 tablets (17.2 mg total) by mouth daily.   THIAMINE 100 MG TABLET    Take 100 mg by mouth daily.   TRAMADOL (ULTRAM) 50 MG TABLET    Take 1 tablet (50 mg total) by mouth 3 (three) times daily.  Modified Medications   No medications on file  Discontinued Medications   No medications on file     SIGNIFICANT DIAGNOSTIC EXAMS    Not a candidate for dexa; mammogram or colonoscopy    10-02-14: left leg doppler: +dvt  10-08-14: bilateral hip /pelvic fracture: right hip with marked osteoarthritis  04-23-15: right distal ulna and forearm: no acute fracture   10-28-15: left lower extremity doppler: negative for dvt   LABS REVIEWED:   07-14-15: glucose 141; bun 10.3; creat 0.58; k+ 4.1; na++143   08-25-15: wbc 12.8; hgb 11.9; hct 36.5; mcv 82.4; plt 299; glucose 99; bun 20; creat 0.62; k+ 4.3;na++142; liver normal albumin 3.7; urine culture: e-coli: ceftin; guaiac stool: +  08-28-15: wbc 9.4; hgb 11.1; hct 34.0; mcv 84.0; plt 282; glucose 110; bun 11; creat 0.66; k+ 4.0; na++140  09-28-15; wbc 10.1; hgb 11.8; hct 39.0;  mcv 83.6; plt 298; glucose 187; bun 16.1; creat 0.65; k+ 3.8; na++ 140; liver normal albumin 3.9; tsh 1.46; hgb a1c 6.9; chol 143; ldl 48; trig 151; hdl 64  02-02-28: hgb a1c 6.8     Review of Systems Unable to perform ROS: dementia    Physical Exam Constitutional: No distress.  Frail   Neck: Neck supple. No JVD present. No thyromegaly present.  Cardiovascular: Normal rate, regular rhythm and intact distal pulses.   Respiratory: Effort normal and breath sounds normal. No respiratory distress.  GI: Soft. Bowel sounds are normal. She exhibits no distension. There is no tenderness.  Musculoskeletal: 2-3 + lower extremity edema   Is able to move all extremities   Neurological: She is alert.  Skin: Skin is warm and dry. She is not diaphoretic.    ASSESSMENT/ PLAN:  1. Bilateral lower extremity edema: will begin lasix 20 mg daily will use ted hose daily and will check bmp in one week.    Synthia Innocent NP [facility]  Contact [phone] Monday through Friday 8am- 5pm  After hours call [phone]

## 2016-01-04 ENCOUNTER — Non-Acute Institutional Stay (SKILLED_NURSING_FACILITY): Payer: Medicaid Other | Admitting: Adult Health

## 2016-01-04 ENCOUNTER — Encounter: Payer: Self-pay | Admitting: Adult Health

## 2016-01-04 DIAGNOSIS — F03918 Unspecified dementia, unspecified severity, with other behavioral disturbance: Secondary | ICD-10-CM

## 2016-01-04 DIAGNOSIS — R627 Adult failure to thrive: Secondary | ICD-10-CM | POA: Diagnosis not present

## 2016-01-04 DIAGNOSIS — M170 Bilateral primary osteoarthritis of knee: Secondary | ICD-10-CM | POA: Diagnosis not present

## 2016-01-04 DIAGNOSIS — G4700 Insomnia, unspecified: Secondary | ICD-10-CM | POA: Diagnosis not present

## 2016-01-04 DIAGNOSIS — R600 Localized edema: Secondary | ICD-10-CM | POA: Diagnosis not present

## 2016-01-04 DIAGNOSIS — Z86718 Personal history of other venous thrombosis and embolism: Secondary | ICD-10-CM | POA: Diagnosis not present

## 2016-01-04 DIAGNOSIS — D649 Anemia, unspecified: Secondary | ICD-10-CM | POA: Diagnosis not present

## 2016-01-04 DIAGNOSIS — F0391 Unspecified dementia with behavioral disturbance: Secondary | ICD-10-CM | POA: Diagnosis not present

## 2016-01-04 DIAGNOSIS — E1340 Other specified diabetes mellitus with diabetic neuropathy, unspecified: Secondary | ICD-10-CM | POA: Diagnosis not present

## 2016-01-04 DIAGNOSIS — E1142 Type 2 diabetes mellitus with diabetic polyneuropathy: Secondary | ICD-10-CM

## 2016-01-04 NOTE — Progress Notes (Signed)
Patient ID: Sarah Mckay, female   DOB: 1955-06-21, 60 y.o.   MRN: [id number]   Location:   [facility] Room Number: 204-B Place of Service:  SNF (31)   CODE STATUS: Full Code No Known Allergies  Chief Complaint  Patient presents with  . Medical Management of Chronic Issues    Follow up    HPI:  She is a long term resident of this facility being seen for the management of her chronic illnesses. There is little change in her overall status; she is unable to fully participate in the hpi or ros. There are no nursing concerns at this time.    Past Medical History:  Diagnosis Date  . Acute deep vein thrombosis (DVT) of left femoral vein (HCC) 02/22/2015  . Alcohol abuse   . Arthritis   . Dementia with behavioral problem   . Diabetes mellitus without complication (HCC)   . Dysphagia    and aspiration risk  . 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 Seizures (HCC)     History reviewed. No pertinent surgical history.  Social History   Social History  . Marital status: Unknown    Spouse name: N/A  . Number of children: N/A  . Years of education: N/A   Occupational History  . Not on file.   Social History Main Topics  . Smoking status: Current Every Day Smoker    Packs/day: 0.50    Years: 15.00    Types: Cigarettes  . Smokeless tobacco: Not on file  . Alcohol use 1.2 oz/week    2 Cans of beer per week     Comment: occ  . Drug use: No  . Sexual activity: Not Currently   Other Topics Concern  . Not on file   Social History Narrative   ** Merged History Encounter **       History reviewed. No pertinent family history.   Patient's Medications  New Prescriptions   No medications on file  Previous Medications   AMINO ACIDS-PROTEIN HYDROLYS (FEEDING SUPPLEMENT, PRO-STAT SUGAR FREE 64,) LIQD    Take 30 mLs by mouth 2 (two) times daily. 0800 and 1700   ASPIRIN 81 MG TABLET    Take 81 mg by mouth daily.   CHOLECALCIFEROL (VITAMIN D) 1000 UNITS TABLET     Take 1,000 Units by mouth daily. Reported on 10/28/2015   DICLOFENAC SODIUM (VOLTAREN) 1 % GEL    Apply 2 g topically 4 (four) times daily. To both knees   FUROSEMIDE (LASIX) 20 MG TABLET    Take 20 mg by mouth daily.   GABAPENTIN (NEURONTIN) 300 MG CAPSULE    Take 3 capsules (900 mg total) by mouth 3 (three) times daily.   LORAZEPAM (ATIVAN) 0.5 MG TABLET    Take 1 tablet (0.5 mg total) by mouth every 8 (eight) hours as needed for anxiety.   MELATONIN 3 MG TABS    Take 3 mg by mouth at bedtime as needed (sleep).    METFORMIN (GLUCOPHAGE-XR) 500 MG 24 HR TABLET    Take 500 mg by mouth daily with breakfast.   MULTIPLE VITAMIN (MULTIVITAMIN WITH MINERALS) TABS TABLET    Take 2 tablets by mouth at bedtime.    OLANZAPINE (ZYPREXA) 7.5 MG TABLET    Take 7.5 mg by mouth at bedtime.   POLYETHYLENE GLYCOL (MIRALAX / GLYCOLAX) PACKET    Take 17 g by mouth daily.   SENNA (SENOKOT) 8.6 MG TABS TABLET    Take 2  tablets (17.2 mg total) by mouth daily.   THIAMINE 100 MG TABLET    Take 100 mg by mouth daily.   TRAMADOL (ULTRAM) 50 MG TABLET    Take 1 tablet (50 mg total) by mouth 3 (three) times daily.  Modified Medications   No medications on file  Discontinued Medications   BACITRACIN-NEOMYCIN-POLYMYXIN-HYDROCORTISONE (CORTISPORIN) 1 % OINTMENT    Apply 1 application topically daily.     SIGNIFICANT DIAGNOSTIC EXAMS  Not a candidate for dexa; mammogram or colonoscopy    10-02-14: left leg doppler: +dvt  10-08-14: bilateral hip /pelvic fracture: right hip with marked osteoarthritis  04-23-15: right distal ulna and forearm: no acute fracture   10-28-15: left lower extremity doppler: negative for dvt   LABS REVIEWED:   07-14-15: glucose 141; bun 10.3; creat 0.58; k+ 4.1; na++143   08-25-15: wbc 12.8; hgb 11.9; hct 36.5; mcv 82.4; plt 299; glucose 99; bun 20; creat 0.62; k+ 4.3;na++142; liver normal albumin 3.7; urine culture: e-coli: ceftin; guaiac stool: +  08-28-15: wbc 9.4; hgb 11.1; hct 34.0; mcv  84.0; plt 282; glucose 110; bun 11; creat 0.66; k+ 4.0; na++140  09-28-15; wbc 10.1; hgb 11.8; hct 39.0; mcv 83.6; plt 298; glucose 187; bun 16.1; creat 0.65; k+ 3.8; na++ 140; liver normal albumin 3.9; tsh 1.46; hgb a1c 6.9; chol 143; ldl 48; trig 151; hdl 64  1-6-105-4-17: hgb a1c 6.8     Review of Systems Unable to perform ROS: dementia    Physical Exam Constitutional: No distress.  Frail   Neck: Neck supple. No JVD present. No thyromegaly present.  Cardiovascular: Normal rate, regular rhythm and intact distal pulses.   Respiratory: Effort normal and breath sounds normal. No respiratory distress.  GI: Soft. Bowel sounds are normal. She exhibits no distension. There is no tenderness.  Musculoskeletal: 2-3 + lower extremity edema   Is able to move all extremities   Neurological: She is alert.  Skin: Skin is warm and dry. She is not diaphoretic.    ASSESSMENT/ PLAN:  1. Diabetes: she is presently stable her hgb a1c 6.8 will continue metformin xr 500 mg daily;   and will monitor  Urine for micro-albumin is 7.4;   2. Dysphagia: no signs of aspiration present; will not make changes in her current plan of care and will monitor her status.   3. 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   4. Psychosis; due to alcohol: she is presently without change in status; will continue zyprexa 7.5 mg daily; thiamine 100 mg daily;  will monitor  5. 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 Her current weight is 154 pounds.    6. DVT left leg:  since 10-02-14. will continue  asa 81 mg daily and will monitor  7. Constipation: will miralax daily and  senna 2 tabs daily   8. Bilateral lower extremity edema: will continue lasix 20 mg daily     MD is aware of resident's narcotic use and is in agreement with current plan of care. We will attempt to wean resident as apropriate   Synthia Innocenteborah Tremaine Fuhriman NP [facility]  Contact [phone] Monday through Friday  8am- 5pm  After hours call [phone]

## 2016-01-07 ENCOUNTER — Encounter: Payer: Self-pay | Admitting: Adult Health

## 2016-01-07 ENCOUNTER — Non-Acute Institutional Stay (SKILLED_NURSING_FACILITY): Payer: Medicaid Other | Admitting: Adult Health

## 2016-01-07 DIAGNOSIS — L89313 Pressure ulcer of right buttock, stage 3: Secondary | ICD-10-CM

## 2016-01-07 NOTE — Progress Notes (Signed)
Patient ID: Sarah Mckay, female   DOB: [birth year], 58 y.o.   MRN: [id number]   Location:   [facility] Room Number: 204-B Place of Service:  SNF (31)   CODE STATUS: Full Code  No Known Allergies  Chief Complaint  Patient presents with  . Acute Visit    Wound Management    HPI:  I have been asked to review her wound for secondary signs of infection. There are no reports of fever present. She is unable to fully participate in the hpi or ros. She denies pain.   Past Medical History:  Diagnosis Date  . Acute deep vein thrombosis (DVT) of left femoral vein (HCC) 02/22/2015  . Alcohol abuse   . Arthritis   . Dementia with behavioral problem   . Diabetes mellitus without complication (HCC)   . Dysphagia    and aspiration risk  . 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 Seizures (HCC)     History reviewed. No pertinent surgical history.  Social History   Social History  . Marital status: Unknown    Spouse name: N/A  . Number of children: N/A  . Years of education: N/A   Occupational History  . Not on file.   Social History Main Topics  . Smoking status: Current Every Day Smoker    Packs/day: 0.50    Years: 15.00    Types: Cigarettes  . Smokeless tobacco: Not on file  . Alcohol use 1.2 oz/week    2 Cans of beer per week     Comment: occ  . Drug use: No  . Sexual activity: Not Currently   Other Topics Concern  . Not on file   Social History Narrative   ** Merged History Encounter **       History reviewed. No pertinent family history.    VITAL SIGNS BP 130/77   Pulse 99   Temp 97.7 F (36.5 C) (Oral)   Resp 18   Ht  (1.676 m)   Wt 161 lb 7 oz (73.2 kg)   LMP 03/25/2015 (Approximate)   BMI 26.06 kg/m   Patient's Medications  New Prescriptions   No medications on file  Previous Medications   AMINO ACIDS-PROTEIN HYDROLYS (FEEDING SUPPLEMENT, PRO-STAT SUGAR FREE 64,) LIQD    Take 30 mLs by mouth 2 (two) times daily. 0800 and 1700   ASPIRIN 81 MG TABLET    Take 81 mg by mouth daily.   CHOLECALCIFEROL (VITAMIN D) 1000 UNITS TABLET    Take 1,000 Units by mouth daily. Reported on 10/28/2015   DICLOFENAC SODIUM (VOLTAREN) 1 % GEL    Apply 2 g topically 4 (four) times daily. To both knees   FUROSEMIDE (LASIX) 20 MG TABLET    Take 20 mg by mouth daily.   GABAPENTIN (NEURONTIN) 300 MG CAPSULE    Take 3 capsules (900 mg total) by mouth 3 (three) times daily.   LORAZEPAM (ATIVAN) 0.5 MG TABLET    Take 1 tablet (0.5 mg total) by mouth every 8 (eight) hours as needed for anxiety.   MELATONIN 3 MG TABS    Take 3 mg by mouth at bedtime as needed (sleep).    METFORMIN (GLUCOPHAGE-XR) 500 MG 24 HR TABLET    Take 500 mg by mouth daily with breakfast.   MULTIPLE VITAMIN (MULTIVITAMIN WITH MINERALS) TABS TABLET    Take 2 tablets by mouth at bedtime.    OLANZAPINE (ZYPREXA) 7.5 MG TABLET    Take 7.5 mg  by mouth at bedtime.   POLYETHYLENE GLYCOL (MIRALAX / GLYCOLAX) PACKET    Take 17 g by mouth daily.   SENNA (SENOKOT) 8.6 MG TABS TABLET    Take 2 tablets (17.2 mg total) by mouth daily.   THIAMINE 100 MG TABLET    Take 100 mg by mouth daily.   TRAMADOL (ULTRAM) 50 MG TABLET    Take 1 tablet (50 mg total) by mouth 3 (three) times daily.  Modified Medications   No medications on file  Discontinued Medications   No medications on file     SIGNIFICANT DIAGNOSTIC EXAMS  Not a candidate for dexa; mammogram or colonoscopy    10-02-14: left leg doppler: +dvt  10-08-14: bilateral hip /pelvic fracture: right hip with marked osteoarthritis  04-23-15: right distal ulna and forearm: no acute fracture   10-28-15: left lower extremity doppler: negative for dvt   LABS REVIEWED:   07-14-15: glucose 141; bun 10.3; creat 0.58; k+ 4.1; na++143   08-25-15: wbc 12.8; hgb 11.9; hct 36.5; mcv 82.4; plt 299; glucose 99; bun 20; creat 0.62; k+ 4.3;na++142; liver normal albumin 3.7; urine culture: e-coli: ceftin; guaiac stool: +  08-28-15: wbc 9.4; hgb 11.1; hct  34.0; mcv 84.0; plt 282; glucose 110; bun 11; creat 0.66; k+ 4.0; na++140  09-28-15; wbc 10.1; hgb 11.8; hct 39.0; mcv 83.6; plt 298; glucose 187; bun 16.1; creat 0.65; k+ 3.8; na++ 140; liver normal albumin 3.9; tsh 1.46; hgb a1c 6.9; chol 143; ldl 48; trig 151; hdl 64  1-6-105-4-17: hgb a1c 6.8  12-15-15: glucose 11; bun 16.7; creat 0.53; k+ 4.0; na+ 141     Review of Systems Unable to perform ROS: dementia    Physical Exam Constitutional: No distress.  Frail   Neck: Neck supple. No JVD present. No thyromegaly present.  Cardiovascular: Normal rate, regular rhythm and intact distal pulses.   Respiratory: Effort normal and breath sounds normal. No respiratory distress.  GI: Soft. Bowel sounds are normal. She exhibits no distension. There is no tenderness.  Musculoskeletal: 2-3 + lower extremity edema   Is able to move all extremities   Neurological: She is alert.  Skin: Skin is warm and dry. She is not diaphoretic. Right buttock: 2.2 x 1.6 x 0.11 cm silver alginate   ASSESSMENT/ PLAN:  1. Right buttock stage III ulceration; is followed by wound doctor will continue current treatment and will monitor wound is without secondary signs of infection warranting culture for group A strept.     MD is aware of resident's narcotic use and is in agreement with current plan of care. We will attempt to wean resident as apropriate   Sarah Innocenteborah Tanyia Grabbe NP [facility]  Contact [phone] Monday through Friday 8am- 5pm  After hours call [phone]

## 2016-01-25 DIAGNOSIS — L89313 Pressure ulcer of right buttock, stage 3: Secondary | ICD-10-CM | POA: Insufficient documentation

## 2016-02-22 ENCOUNTER — Non-Acute Institutional Stay (SKILLED_NURSING_FACILITY): Payer: Medicaid Other | Admitting: Internal Medicine

## 2016-02-22 ENCOUNTER — Encounter: Payer: Self-pay | Admitting: Internal Medicine

## 2016-02-22 DIAGNOSIS — E1142 Type 2 diabetes mellitus with diabetic polyneuropathy: Secondary | ICD-10-CM

## 2016-02-22 DIAGNOSIS — F03918 Unspecified dementia, unspecified severity, with other behavioral disturbance: Secondary | ICD-10-CM

## 2016-02-22 DIAGNOSIS — Z86718 Personal history of other venous thrombosis and embolism: Secondary | ICD-10-CM | POA: Diagnosis not present

## 2016-02-22 DIAGNOSIS — F0391 Unspecified dementia with behavioral disturbance: Secondary | ICD-10-CM | POA: Diagnosis not present

## 2016-02-22 DIAGNOSIS — M170 Bilateral primary osteoarthritis of knee: Secondary | ICD-10-CM

## 2016-02-22 NOTE — Progress Notes (Signed)
Patient ID: Sarah Mckay, female   DOB: December 30, 1955, 60 y.o.   MRN: [id number]   Location:  [facility] Room Number: 204-B Place of Service:  SNF [phone]) Provider:  Edmon Crape, PA-C  Kirt Boys, DO  Patient Care Team: Kirt Boys, DO as PCP - General (Internal Medicine) Sharee Holster, NP as Nurse Practitioner (Nurse Practitioner)  Extended Emergency Contact Information Primary Emergency Contact: Mentzer,Marcus Address: [address]          [place], [place] [address] [place] Home Phone: [phone] Mobile Phone: [phone] Relation: Brother Secondary Emergency Contact: Dewaine O[place] Home Phone: [phone] Relation: Niece  Code Status:  Full Code Goals of care: Advanced Directive information Advanced Directives 02/22/2016  Does patient have an advance directive? No  Would patient like information on creating an advanced directive? No - patient declined information     Chief Complaint  Patient presents with  . Medical Management of Chronic Issues    Follow up  Routine medical issues including chronic pain with osteoarthritis of knees-dementia-diabetes type 2-psychosis-history of DVT-constipation-lower extremity edema-  HPI:  Pt is a 60 y.o. female seen today for medical management of chronic diseases.  As noted above per nursing staff she appears to be at baseline and stable.  She does have a significant history of osteoarthritis of her knees and continues on fairly aggressive pain management including tramadol 50 mg 3 times a day-Voltaren gel 4 times a day she is also on Neurontin 900 mg 3 times a day.  .  Regards diabetes she does continue on Glucophage 5 mg once a day most recent hemoglobin A1c showed stability at 6.8 done in May 2 blood sugars have been able to assess are in the lower 100s.  Per chart review I do note she was hospitalized several months ago for rectal bleeding she had  been on Xarelto with a history of left leg DVT   This has since been discontinued and she is on aspirin last hemoglobin was 11.8 on lab done in May 2017 we will update this there's been no further evidence of bleeding apparently.  She does have a history of psychosis due to alcohol this appears relatively unchanged she is pleasant and appropriate with exam today she is on Zyprexa of 7.5 mg a day as well as thiamine.  She does have a history of bilateral lower extremity edema is on torsemide 10 mg a day this appears relatively baseline appears she has gained approximately 6 pounds over the past several weeks but nursing states her edema appears to be relatively baseline.  Her vital signs appear to be stable-     Past Medical History:  Diagnosis Date  . Acute deep vein thrombosis (DVT) of left femoral vein (HCC) 02/22/2015  . Alcohol abuse   . Arthritis   . Dementia with behavioral problem   . Diabetes mellitus without complication (HCC)   . Dysphagia    and aspiration risk  . 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 Seizures (HCC)    History reviewed. No pertinent surgical history.  No Known Allergies    Medication List       Accurate as of 02/22/16  3:07 PM. Always use your most recent med list.          aspirin 81 MG tablet Take 81 mg by mouth daily.   cholecalciferol 1000 units tablet Commonly known as:  VITAMIN D Take 1,000 Units by mouth  daily. Reported on 10/28/2015   diclofenac sodium 1 % Gel Commonly known as:  VOLTAREN Apply 2 g topically 4 (four) times daily. To both knees   feeding supplement (PRO-STAT SUGAR FREE 64) Liqd Take 30 mLs by mouth 2 (two) times daily. 0800 and 1700   gabapentin 300 MG capsule Commonly known as:  NEURONTIN Take 3 capsules (900 mg total) by mouth 3 (three) times daily.   LORazepam 0.5 MG tablet Commonly known as:  ATIVAN Take 1 tablet (0.5 mg total) by mouth every 8 (eight) hours as needed for anxiety.   Melatonin 3 MG Tabs Take  3 mg by mouth at bedtime as needed (sleep).   metFORMIN 500 MG 24 hr tablet Commonly known as:  GLUCOPHAGE-XR Take 500 mg by mouth daily with breakfast.   multivitamin with minerals Tabs tablet Take 2 tablets by mouth at bedtime.   OLANZapine 7.5 MG tablet Commonly known as:  ZYPREXA Take 7.5 mg by mouth at bedtime.   polyethylene glycol packet Commonly known as:  MIRALAX / GLYCOLAX Take 17 g by mouth daily.   senna 8.6 MG Tabs tablet Commonly known as:  SENOKOT Take 2 tablets (17.2 mg total) by mouth daily.   thiamine 100 MG tablet Take 100 mg by mouth daily.   torsemide 10 MG tablet Commonly known as:  DEMADEX Take 10 mg by mouth daily.   traMADol 50 MG tablet Commonly known as:  ULTRAM Take 1 tablet (50 mg total) by mouth 3 (three) times daily.       Review of Systems   This is limited secondary to dementia at times she does continue to complain of some arthritic type pain of her knees otherwise apparently has not had complaints.  She is not complaining of any chest pain shortness of breath at times has increased anxiety per nursing  Immunization History  Administered Date(s) Administered  . Influenza-Unspecified 07/06/2015  . Pneumococcal-Unspecified 06/16/2014  . Tdap 01/18/2014   Pertinent  Health Maintenance Due  Topic Date Due  . INFLUENZA VACCINE  12/28/2015  . PAP SMEAR  05/29/2016 (Originally 03/04/1979)  . URINE MICROALBUMIN  05/29/2016 (Originally 03/03/1968)  . HEMOGLOBIN A1C  04/01/2016  . FOOT EXAM  06/14/2016  . OPHTHALMOLOGY EXAM  09/13/2016   No flowsheet data found. Functional Status Survey:    Vitals:   02/22/16 1502  BP: 125/80  Pulse: (!) 103  Resp: 18  Temp: 98 F (36.7 C)  TempSrc: Oral  SpO2: 97%  Weight: 162 lb 3 oz (73.6 kg)  Height: 5\' 6"  (1.676 m)   Body mass index is 26.18 kg/m. Physical Exam   Constitutional: No distress. Sitting comfortably in her wheelchair  Frail   Neck: Neck supple. No JVD present. No  thyromegaly present.  Cardiovascular: Normal rate, regular rhythm and intact distal pulses.   Respiratory: Effort normal and breath sounds normal. No respiratory distress.  GI: Soft. Bowel sounds are normal. She exhibits no distension. There is no tenderness.  Musculoskeletal: 1-2 + lower extremity edema   Is able to move all extremities--with arthritic changes of her knees bilaterally   Neurological: She is alert.  Skin: Skin is warm and dry. She is not diaphoretic. Has a right buttocks ulcer that is followed by the wound care physician   Labs reviewed:  Recent Labs  08/26/15 0509 08/27/15 0502 08/28/15 0529 09/28/15 12/15/15  NA 144 145 140 142 141  K 4.1 3.9 4.0 3.8 4.0  CL 111 110 104  --   --  CO2 25 24 26   --   --   GLUCOSE 141* 117* 110*  --   --   BUN 13 10 11 16 17   CREATININE 0.65 0.50 0.66 0.6 0.5  CALCIUM 9.2 9.5 9.3  --   --     Recent Labs  08/25/15 1941 09/28/15  AST 26 21  ALT 20 26  ALKPHOS 104 111  BILITOT 0.3  --   PROT 8.1  --   ALBUMIN 3.7  --     Recent Labs  08/25/15 1941  08/26/15 0509 08/27/15 0502 08/28/15 0529  WBC 12.8*  --   --  9.9 9.4  NEUTROABS 7.6  --   --   --   --   HGB 11.9*  < > 11.1* 11.2* 11.1*  HCT 36.5  < > 34.0* 34.2* 34.0*  MCV 82.4  --   --  83.4 84.0  PLT 299  --   --  278 282  < > = values in this interval not displayed. Lab Results  Component Value Date   TSH 1.46 09/28/2015   Lab Results  Component Value Date   HGBA1C 6.8 09/30/2015   Lab Results  Component Value Date   CHOL 143 09/28/2015   HDL 64 09/28/2015   LDLCALC 48 09/28/2015   TRIG 151 09/28/2015    Significant Diagnostic Results in last 30 days:  No results found.  Assessment/Plan   1. Diabetes: she is presently stable her hgb a1c 6.8 will continue metformin xr 500 mg daily;   Urine micro-albumin is 7.4--will update hemoglobin A1c;   2. Dysphagia: no signs of aspiration present; will not make changes in her current plan of care and  will monitor her status.   3. 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   4. Psychosis; due to alcohol: she is presently without change in status; will continue zyprexa 7.5 mg daily; thiamine 100 mg daily;  will monitor pleasant and appropriate with exam today appeared to be in good spirits  5. 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 Her current weight is 162 pounds.    6. DVT left leg:  since 10-02-14. will continue  asa 81 mg daily and will monitor  7. Constipation: will miralax daily and senna 2 tabs daily   8. Bilateral lower extremity edema: will continue lasix 20 mg daily Will update a BMP-per nursing edema continues to be at baseline  #9 history of rectal bleeding-again she is now on aspirin instead of Xarelto for DVT-appears to be tolerating this well will update a hemoglobin to ensure stability.  #10 history of buttocks wound again this is followed by the wound care physician  [phone]

## 2016-02-23 LAB — HEMOGLOBIN A1C: Hemoglobin A1C: 7.4

## 2016-02-23 LAB — BASIC METABOLIC PANEL
BUN: 13 mg/dL (ref 4–21)
Creatinine: 0.5 mg/dL (ref 0.5–1.1)
GLUCOSE: 103 mg/dL
Potassium: 4.1 mmol/L (ref 3.4–5.3)
SODIUM: 143 mmol/L (ref 137–147)

## 2016-02-23 LAB — CBC AND DIFFERENTIAL
HEMATOCRIT: 35 % — AB (ref 36–46)
Hemoglobin: 10.7 g/dL — AB (ref 12.0–16.0)
Platelets: 257 10*3/uL (ref 150–399)
WBC: 9.7 10^3/mL

## 2016-03-21 ENCOUNTER — Non-Acute Institutional Stay (SKILLED_NURSING_FACILITY): Payer: Medicaid Other | Admitting: Adult Health

## 2016-03-21 ENCOUNTER — Encounter: Payer: Self-pay | Admitting: Adult Health

## 2016-03-21 DIAGNOSIS — E1142 Type 2 diabetes mellitus with diabetic polyneuropathy: Secondary | ICD-10-CM | POA: Diagnosis not present

## 2016-03-21 DIAGNOSIS — F0151 Vascular dementia with behavioral disturbance: Secondary | ICD-10-CM

## 2016-03-21 DIAGNOSIS — R600 Localized edema: Secondary | ICD-10-CM | POA: Diagnosis not present

## 2016-03-21 DIAGNOSIS — R1312 Dysphagia, oropharyngeal phase: Secondary | ICD-10-CM

## 2016-03-21 DIAGNOSIS — Z86718 Personal history of other venous thrombosis and embolism: Secondary | ICD-10-CM | POA: Diagnosis not present

## 2016-03-21 DIAGNOSIS — F10959 Alcohol use, unspecified with alcohol-induced psychotic disorder, unspecified: Secondary | ICD-10-CM

## 2016-03-21 DIAGNOSIS — M170 Bilateral primary osteoarthritis of knee: Secondary | ICD-10-CM | POA: Diagnosis not present

## 2016-03-21 DIAGNOSIS — F01518 Vascular dementia, unspecified severity, with other behavioral disturbance: Secondary | ICD-10-CM

## 2016-03-21 NOTE — Progress Notes (Signed)
Patient ID: Sarah Mckay, female   DOB: 1956/03/23, 60 y.o.   MRN: [id number]   Location:   [facility] Room Number: 204-B Place of Service:  SNF (31)   CODE STATUS: Full Code  No Known Allergies  Chief Complaint  Patient presents with  . Medical Management of Chronic Issues    Follow up    HPI:  She is a long term resident of this facility being seen for the management of her chronic illnesses. Overall there is little change in her status. She does get out of bed on a daily basis. She is unable to fully participate in the phi or ros; but did tell me that she is feeling good.    Past Medical History:  Diagnosis Date  . Acute deep vein thrombosis (DVT) of left femoral vein (HCC) 02/22/2015  . Alcohol abuse   . Arthritis   . Dementia with behavioral problem   . Diabetes mellitus without complication (HCC)   . Dysphagia    and aspiration risk  . 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 Seizures (HCC)     History reviewed. No pertinent surgical history.  Social History   Social History  . Marital status: Unknown    Spouse name: N/A  . Number of children: N/A  . Years of education: N/A   Occupational History  . Not on file.   Social History Main Topics  . Smoking status: Current Every Day Smoker    Packs/day: 0.50    Years: 15.00    Types: Cigarettes  . Smokeless tobacco: Not on file  . Alcohol use 1.2 oz/week    2 Cans of beer per week     Comment: occ  . Drug use: No  . Sexual activity: Not Currently   Other Topics Concern  . Not on file   Social History Narrative   ** Merged History Encounter **       History reviewed. No pertinent family history.    VITAL SIGNS BP 116/64   Pulse 91   Temp 97.8 F (36.6 C) (Oral)   Resp 18   Ht 5\' 6"  (1.676 m)   Wt 163 lb (73.9 kg)   LMP 03/25/2015 (Approximate)   SpO2 98%   BMI 26.31 kg/m   Patient's Medications  New Prescriptions   No medications on file  Previous Medications   AMINO  ACIDS-PROTEIN HYDROLYS (FEEDING SUPPLEMENT, PRO-STAT SUGAR FREE 64,) LIQD    Take 30 mLs by mouth 2 (two) times daily. 0800 and 1700   ASPIRIN 81 MG TABLET    Take 81 mg by mouth daily.   CHOLECALCIFEROL (VITAMIN D) 1000 UNITS TABLET    Take 1,000 Units by mouth daily. Reported on 10/28/2015   DICLOFENAC SODIUM (VOLTAREN) 1 % GEL    Apply 2 g topically 4 (four) times daily. To both knees   GABAPENTIN (NEURONTIN) 300 MG CAPSULE    Take 3 capsules (900 mg total) by mouth 3 (three) times daily.   LORAZEPAM (ATIVAN) 0.5 MG TABLET    Take 1 tablet (0.5 mg total) by mouth every 8 (eight) hours as needed for anxiety.   MELATONIN 3 MG TABS    Take 3 mg by mouth at bedtime as needed (sleep).    METFORMIN (GLUCOPHAGE-XR) 500 MG 24 HR TABLET    Take 500 mg by mouth daily with breakfast.   MULTIPLE VITAMIN (MULTIVITAMIN WITH MINERALS) TABS TABLET    Take 2 tablets by mouth at bedtime.  OLANZAPINE (ZYPREXA) 7.5 MG TABLET    Take 7.5 mg by mouth at bedtime.   POLYETHYLENE GLYCOL (MIRALAX / GLYCOLAX) PACKET    Take 17 g by mouth daily.   SENNA (SENOKOT) 8.6 MG TABS TABLET    Take 2 tablets (17.2 mg total) by mouth daily.   THIAMINE 100 MG TABLET    Take 100 mg by mouth daily.   TORSEMIDE (DEMADEX) 10 MG TABLET    Take 10 mg by mouth daily.   TRAMADOL (ULTRAM) 50 MG TABLET    Take 1 tablet (50 mg total) by mouth 3 (three) times daily.  Modified Medications   No medications on file  Discontinued Medications   No medications on file     SIGNIFICANT DIAGNOSTIC EXAMS  Not a candidate for dexa; mammogram or colonoscopy    10-02-14: left leg doppler: +dvt  10-08-14: bilateral hip /pelvic fracture: right hip with marked osteoarthritis  04-23-15: right distal ulna and forearm: no acute fracture   10-28-15: left lower extremity doppler: negative for dvt   LABS REVIEWED:   07-14-15: glucose 141; bun 10.3; creat 0.58; k+ 4.1; na++143   08-25-15: wbc 12.8; hgb 11.9; hct 36.5; mcv 82.4; plt 299; glucose 99; bun  20; creat 0.62; k+ 4.3;na++142; liver normal albumin 3.7; urine culture: e-coli: ceftin; guaiac stool: +  08-28-15: wbc 9.4; hgb 11.1; hct 34.0; mcv 84.0; plt 282; glucose 110; bun 11; creat 0.66; k+ 4.0; na++140  09-28-15; wbc 10.1; hgb 11.8; hct 39.0; mcv 83.6; plt 298; glucose 187; bun 16.1; creat 0.65; k+ 3.8; na++ 140; liver normal albumin 3.9; tsh 1.46; hgb a1c 6.9; chol 143; ldl 48; trig 151; hdl 64  2-8-415-4-17: hgb a1c 6.8  12-15-15: glucose 11; bun 16.7; creat 0.53; k+ 4.0; na+ 141  02-23-16: wbc 9.7; hgb 10.7; hct 34.9; mcv 84.0; plt 257; glucose 103; bun 12.7; creat 0.53; k+ 4.1; na++ 143 hgb a1c 7.4    Review of Systems Unable to perform ROS: dementia    Physical Exam Constitutional: No distress.  Frail   Neck: Neck supple. No JVD present. No thyromegaly present.  Cardiovascular: Normal rate, regular rhythm and intact distal pulses.   Respiratory: Effort normal and breath sounds normal. No respiratory distress.  GI: Soft. Bowel sounds are normal. She exhibits no distension. There is no tenderness.  Musculoskeletal: 2 + lower extremity edema   Is able to move all extremities   Neurological: She is alert.  Skin: Skin is warm and dry. She is not diaphoretic. Right buttock: has resolved.    ASSESSMENT/ PLAN:  1. Diabetes:  hgb a1c is 7.4  Urine for micro-albumin is 7.4;   Will increase her metformin xr to 1 gm daily   2. Dysphagia: no signs of aspiration present; will not make changes in her current plan of care and will monitor her status.   3. 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   4. Psychosis; due to alcohol: she is presently without change in status; will continue zyprexa 7.5 mg daily; thiamine 100 mg daily;  will monitor  5. 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 Her current weight is  163 pounds.    6. DVT left leg:  since 10-02-14. will continue  asa 81 mg daily and will monitor  7. Constipation: will miralax daily and senna 2 tabs daily   8. Bilateral lower extremity edema: will continue demadex 10  mg daily    Will check urine micro-albumin    MD is aware of resident's narcotic use and is in agreement with current plan of care. We will attempt to wean resident as apropriate   Synthia Innocent NP [facility]  Contact [phone] Monday through Friday 8am- 5pm  After hours call [phone]

## 2016-03-23 LAB — MICROALBUMIN, URINE: MICROALB UR: 1.7

## 2016-04-04 LAB — BASIC METABOLIC PANEL
BUN: 21 mg/dL (ref 4–21)
CREATININE: 0.7 mg/dL (ref <=1.1)
GLUCOSE: 142 mg/dL
Potassium: 3.8 mmol/L (ref 3.4–5.3)
Sodium: 148 mmol/L — AB (ref 137–147)

## 2016-04-17 ENCOUNTER — Encounter: Payer: Self-pay | Admitting: Internal Medicine

## 2016-04-17 ENCOUNTER — Non-Acute Institutional Stay (SKILLED_NURSING_FACILITY): Payer: Medicaid Other | Admitting: Internal Medicine

## 2016-04-17 DIAGNOSIS — R21 Rash and other nonspecific skin eruption: Secondary | ICD-10-CM | POA: Diagnosis not present

## 2016-04-17 NOTE — Progress Notes (Signed)
Patient ID: Sarah OxfordFrieda Forness, female   DOB: 03/25/1956, 60 y.o.   MRN: [id number]    DATE:  04/17/2016  Location:    [facility] Room Number: 204 B Place of Service: SNF (31)   Extended Emergency Contact Information Primary Emergency Contact: Diprima,Marcus Address: [address]          [place], [place] [address] [place] Home Phone: [phone] Mobile Phone: [phone] Relation: Brother Secondary Emergency Contact: Dewaine Oatsarpenter,Marie  [place] Home Phone: [phone] Relation: Niece  Advanced Directive information Does patient have an advance directive?: Yes, Does patient want to make changes to advanced directive?: No - Patient declined  Chief Complaint  Patient presents with  . Acute Visit    Rash    HPI:  60 yo female long term resident seen today for itchy rash noted today by nursing and pt. She reports intense itching. No sick contacts. No f/c. She is a poor historian due to dementia. Hx obtained from chart  DM - A1c 7.4%; Urine microalbumin is 7.4;  Takes metformin xr to 1 gm daily   Dysphagia - stable. 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 weight is 163 pounds.    Hx DVT left leg - since 10-02-14. Takes ASA 81 mg daily   Constipation - stable on  miralax daily and senna 2 tabs daily   Bilateral lower extremity edema - stable on demadex 10  mg daily     Past Medical History:  Diagnosis Date  . Acute deep vein thrombosis (DVT) of left femoral vein (HCC) 02/22/2015  . Alcohol abuse   . Arthritis   . Dementia with behavioral problem   . Diabetes mellitus without complication (HCC)   . Dysphagia     and aspiration risk  . 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 Seizures (HCC)     History reviewed. No pertinent surgical history.  Patient Care Team: Kirt BoysMonica Iszabella Hebenstreit, DO as PCP - General (Internal Medicine) Sharee Holstereborah S Green, NP as Nurse Practitioner (Nurse Practitioner)  Social History   Social History  . Marital status: Unknown    Spouse name: N/A  . Number of children: N/A  . Years of education: N/A   Occupational History  . Not on file.   Social History Main Topics  . Smoking status: Current Every Day Smoker    Packs/day: 0.50    Years: 15.00    Types: Cigarettes  . Smokeless tobacco: Never Used  . Alcohol use 1.2 oz/week    2 Cans of beer per week     Comment: occ  . Drug use: No  . Sexual activity: Not Currently   Other Topics Concern  . Not on file   Social History Narrative   ** Merged History Encounter **         reports that she has been smoking Cigarettes.  She has a 7.50 pack-year smoking history. She has never used smokeless tobacco. She reports that she drinks about 1.2 oz of alcohol per week . She reports that she does not use drugs.  History reviewed. No pertinent family history. No family status  information on file.    Immunization History  Administered Date(s) Administered  . Influenza-Unspecified 07/06/2015, 03/02/2016  . PPD Test 02/11/2016, 02/18/2016  . Pneumococcal-Unspecified 06/16/2014  . Tdap 01/18/2014    No Known Allergies  Medications: Patient's Medications  New Prescriptions   No medications on file  Previous Medications   AMINO ACIDS-PROTEIN HYDROLYS (FEEDING SUPPLEMENT, PRO-STAT SUGAR FREE 64,) LIQD    Take 30 mLs by mouth 2 (two) times daily. 0800 and 1700   ASPIRIN 81 MG TABLET    Take 81 mg by mouth daily.   CHOLECALCIFEROL (VITAMIN D) 1000 UNITS TABLET    Take 1,000 Units by mouth daily. Reported on 10/28/2015   DICLOFENAC SODIUM (VOLTAREN) 1 % GEL    Apply 2 g topically 4 (four) times daily. To both knees   GABAPENTIN  (NEURONTIN) 300 MG CAPSULE    Take 3 capsules (900 mg total) by mouth 3 (three) times daily.   LORAZEPAM (ATIVAN) 0.5 MG TABLET    Take 1 tablet (0.5 mg total) by mouth every 8 (eight) hours as needed for anxiety.   MELATONIN 3 MG TABS    Take 3 mg by mouth at bedtime as needed (sleep).    METFORMIN (GLUCOPHAGE-XR) 500 MG 24 HR TABLET    Take 1,000 mg by mouth daily with breakfast.    MULTIPLE VITAMIN (MULTIVITAMIN WITH MINERALS) TABS TABLET    Take 2 tablets by mouth at bedtime.    OLANZAPINE (ZYPREXA) 10 MG TABLET    Take 10 mg by mouth at bedtime.    POLYETHYLENE GLYCOL (MIRALAX / GLYCOLAX) PACKET    Take 17 g by mouth daily.   SENNA (SENOKOT) 8.6 MG TABS TABLET    Take 2 tablets (17.2 mg total) by mouth daily.   THIAMINE 100 MG TABLET    Take 100 mg by mouth daily.   TORSEMIDE (DEMADEX) 10 MG TABLET    Take 10 mg by mouth daily.   TRAMADOL (ULTRAM) 50 MG TABLET    Take 1 tablet (50 mg total) by mouth 3 (three) times daily.  Modified Medications   No medications on file  Discontinued Medications   No medications on file    Review of Systems  Unable to perform ROS: Dementia    Vitals:   04/17/16 1415  BP: 106/74  Pulse: 73  Resp: 17  Temp: 97.9 F (36.6 C)  TempSrc: Oral  SpO2: 98%  Weight: 165 lb 1.6 oz (74.9 kg)  Height: 5\' 6"  (1.676 m)   Body mass index is 26.65 kg/m.  Physical Exam  Constitutional: She appears well-developed. No distress.  Frail appearing in NAD  Musculoskeletal: She exhibits deformity.  Neurological: She is alert.  Skin: Skin is warm and dry. Rash (red rash R>L forearm but no secondary signs of infection; no specific burrows) noted. No abrasion and no bruising noted. Rash is not pustular, not vesicular and not urticarial.  Psychiatric: She has a normal mood and affect. Her behavior is normal.     Labs reviewed: Nursing Home on 04/17/2016  Component Date Value Ref Range Status  . Microalb, Ur 03/23/2016 1.7   Final  . Glucose 04/04/2016 142   mg/dL Final  . BUN 40/98/119111/11/2015 21  4 - 21 mg/dL Final  . Creatinine 47/82/956211/11/2015 0.7  0.5 - 1.1 mg/dL Final  . Potassium 13/08/657811/11/2015 3.8  3.4 - 5.3 mmol/L Final  . Sodium 04/04/2016 148* 137 - 147 mmol/L Final  Nursing Home on 03/21/2016  Component Date Value Ref Range  Status  . Hemoglobin 02/23/2016 10.7* 12.0 - 16.0 g/dL Final  . HCT 60/45/4098 35* 36 - 46 % Final  . Platelets 02/23/2016 257  150 - 399 K/L Final  . WBC 02/23/2016 9.7  10^3/mL Final  . Glucose 02/23/2016 103  mg/dL Final  . BUN 11/91/4782 13  4 - 21 mg/dL Final  . Creatinine 95/62/1308 0.5  0.5 - 1.1 mg/dL Final  . Potassium 65/78/4696 4.1  3.4 - 5.3 mmol/L Final  . Sodium 02/23/2016 143  137 - 147 mmol/L Final  . Hemoglobin A1C 02/23/2016 7.4   Final    No results found.   Assessment/Plan   ICD-9-CM ICD-10-CM   1. Rash and nonspecific skin eruption 782.1 R21    probable scabies     Contact isolation  permethrin cream head to toe in PM and wash in AM; repeat in 2 weeks  Cont other meds as ordered  Will follow  Cayce Paschal S. Ancil Linsey  [facility] and [facility] [address] [place], [place] [address] [phone] Cell (Monday-Friday 8 AM - 5 PM) [phone] After 5 PM and follow prompts

## 2016-04-18 ENCOUNTER — Non-Acute Institutional Stay (SKILLED_NURSING_FACILITY): Payer: Medicaid Other | Admitting: Adult Health

## 2016-04-18 ENCOUNTER — Encounter: Payer: Self-pay | Admitting: Adult Health

## 2016-04-18 DIAGNOSIS — F10959 Alcohol use, unspecified with alcohol-induced psychotic disorder, unspecified: Secondary | ICD-10-CM

## 2016-04-18 DIAGNOSIS — Z86718 Personal history of other venous thrombosis and embolism: Secondary | ICD-10-CM | POA: Diagnosis not present

## 2016-04-18 DIAGNOSIS — R1312 Dysphagia, oropharyngeal phase: Secondary | ICD-10-CM

## 2016-04-18 DIAGNOSIS — F0151 Vascular dementia with behavioral disturbance: Secondary | ICD-10-CM | POA: Diagnosis not present

## 2016-04-18 DIAGNOSIS — R627 Adult failure to thrive: Secondary | ICD-10-CM | POA: Diagnosis not present

## 2016-04-18 DIAGNOSIS — E1142 Type 2 diabetes mellitus with diabetic polyneuropathy: Secondary | ICD-10-CM | POA: Diagnosis not present

## 2016-04-18 DIAGNOSIS — G8929 Other chronic pain: Secondary | ICD-10-CM

## 2016-04-18 DIAGNOSIS — F01518 Vascular dementia, unspecified severity, with other behavioral disturbance: Secondary | ICD-10-CM

## 2016-04-18 NOTE — Progress Notes (Signed)
Patient ID: Sarah OxfordFrieda Mckay, female   DOB: 04/10/1956, 60 y.o.   MRN: [id number]   Location:   [facility] Room Number: 204-B Place of Service:  SNF (31)   CODE STATUS: Full Code  No Known Allergies  Chief Complaint  Patient presents with  . Medical Management of Chronic Issues    Follow up    HPI:  She is a long term resident of this facility being seen for the management of her chronic illnesses. She is presently being treated for scabies has completed the first treatment. She is not voicing any complaints; will not stay in her room. There are no nursing concerns at this time.    Past Medical History:  Diagnosis Date  . Acute deep vein thrombosis (DVT) of left femoral vein (HCC) 02/22/2015  . Alcohol abuse   . Arthritis   . Dementia with behavioral problem   . Diabetes mellitus without complication (HCC)   . Dysphagia    and aspiration risk  . 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 Seizures (HCC)     History reviewed. No pertinent surgical history.  Social History   Social History  . Marital status: Unknown    Spouse name: N/A  . Number of children: N/A  . Years of education: N/A   Occupational History  . Not on file.   Social History Main Topics  . Smoking status: Current Every Day Smoker    Packs/day: 0.50    Years: 15.00    Types: Cigarettes  . Smokeless tobacco: Never Used  . Alcohol use 1.2 oz/week    2 Cans of beer per week     Comment: occ  . Drug use: No  . Sexual activity: Not Currently   Other Topics Concern  . Not on file   Social History Narrative   ** Merged History Encounter **       History reviewed. No pertinent family history.    VITAL SIGNS BP 136/62   Pulse 73   Temp 97.9 F (36.6 C) (Oral)   Resp 17   Ht 5\' 6"  (1.676 m)   Wt 165 lb 1 oz (74.9 kg)   LMP 03/25/2015 (Approximate)   SpO2 98%   BMI 26.64 kg/m   Patient's Medications  New Prescriptions   No medications on file  Previous Medications   AMINO  ACIDS-PROTEIN HYDROLYS (FEEDING SUPPLEMENT, PRO-STAT SUGAR FREE 64,) LIQD    Take 30 mLs by mouth 2 (two) times daily. 0800 and 1700   ASPIRIN 81 MG TABLET    Take 81 mg by mouth daily.   CHOLECALCIFEROL (VITAMIN D) 1000 UNITS TABLET    Take 1,000 Units by mouth daily. Reported on 10/28/2015   DICLOFENAC SODIUM (VOLTAREN) 1 % GEL    Apply 2 g topically 4 (four) times daily. To both knees   GABAPENTIN (NEURONTIN) 300 MG CAPSULE    Take 3 capsules (900 mg total) by mouth 3 (three) times daily.   LORAZEPAM (ATIVAN) 0.5 MG TABLET    Take 1 tablet (0.5 mg total) by mouth every 8 (eight) hours as needed for anxiety.   MELATONIN 3 MG TABS    Take 3 mg by mouth at bedtime as needed (sleep).    METFORMIN (GLUCOPHAGE-XR) 500 MG 24 HR TABLET    Take 1,000 mg by mouth daily with breakfast.    MULTIPLE VITAMIN (MULTIVITAMIN WITH MINERALS) TABS TABLET    Take 2 tablets by mouth at bedtime.    OLANZAPINE (ZYPREXA)  10 MG TABLET    Take 10 mg by mouth at bedtime.    PERMETHRIN (ELIMITE) 5 % CREAM    Apply 1 application topically once.   POLYETHYLENE GLYCOL (MIRALAX / GLYCOLAX) PACKET    Take 17 g by mouth daily.   SENNA (SENOKOT) 8.6 MG TABS TABLET    Take 2 tablets (17.2 mg total) by mouth daily.   THIAMINE 100 MG TABLET    Take 100 mg by mouth daily.   TORSEMIDE (DEMADEX) 10 MG TABLET    Take 10 mg by mouth daily.   TRAMADOL (ULTRAM) 50 MG TABLET    Take 1 tablet (50 mg total) by mouth 3 (three) times daily.  Modified Medications   No medications on file  Discontinued Medications   No medications on file     SIGNIFICANT DIAGNOSTIC EXAMS  Not a candidate for dexa; mammogram or colonoscopy    10-02-14: left leg doppler: +dvt  10-08-14: bilateral hip /pelvic fracture: right hip with marked osteoarthritis  04-23-15: right distal ulna and forearm: no acute fracture   10-28-15: left lower extremity doppler: negative for dvt   LABS REVIEWED:   07-14-15: glucose 141; bun 10.3; creat 0.58; k+ 4.1; na++143     08-25-15: wbc 12.8; hgb 11.9; hct 36.5; mcv 82.4; plt 299; glucose 99; bun 20; creat 0.62; k+ 4.3;na++142; liver normal albumin 3.7; urine culture: e-coli: ceftin; guaiac stool: +  08-28-15: wbc 9.4; hgb 11.1; hct 34.0; mcv 84.0; plt 282; glucose 110; bun 11; creat 0.66; k+ 4.0; na++140  09-28-15; wbc 10.1; hgb 11.8; hct 39.0; mcv 83.6; plt 298; glucose 187; bun 16.1; creat 0.65; k+ 3.8; na++ 140; liver normal albumin 3.9; tsh 1.46; hgb a1c 6.9; chol 143; ldl 48; trig 151; hdl 64  06-03-08: hgb a1c 6.8  12-15-15: glucose 11; bun 16.7; creat 0.53; k+ 4.0; na+ 141  02-23-16: wbc 9.7; hgb 10.7; hct 34.9; mcv 84.0; plt 257; glucose 103; bun 12.7; creat 0.53; k+ 4.1; na++ 143 hgb a1c 7.4    Review of Systems Unable to perform ROS: dementia    Physical Exam Constitutional: No distress.  Frail   Neck: Neck supple. No JVD present. No thyromegaly present.  Cardiovascular: Normal rate, regular rhythm and intact distal pulses.   Respiratory: Effort normal and breath sounds normal. No respiratory distress.  GI: Soft. Bowel sounds are normal. She exhibits no distension. There is no tenderness.  Musculoskeletal: 2 + lower extremity edema   Is able to move all extremities   Neurological: She is alert.  Skin: Skin is warm and dry. She is not diaphoretic.     ASSESSMENT/ PLAN:  1. Diabetes:  hgb a1c is 7.4  Urine for micro-albumin is 7.4;   Will continue metformin xr  1 gm daily   2. Dysphagia: no signs of aspiration present; will not make changes in her current plan of care and will monitor her status.   3. 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   4. Psychosis; due to alcohol: she is presently without change in status; will continue zyprexa 7.5 mg daily; thiamine 100 mg  daily;  will monitor  5. 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 Her current weight is 163 pounds.    6. DVT left leg:  since 10-02-14. will continue  asa 81 mg daily and will monitor  7. Constipation: will miralax daily and senna 2 tabs daily   8. Bilateral lower extremity edema: will continue demadex 10  mg daily    MD is aware of resident's narcotic use and is in agreement with current plan of care. We will attempt to wean resident as apropriate   Synthia Innocenteborah Cledith Kamiya NP [facility]  Contact [phone] Monday through Friday 8am- 5pm  After hours call [phone]

## 2016-05-19 ENCOUNTER — Encounter: Payer: Self-pay | Admitting: Adult Health

## 2016-05-19 ENCOUNTER — Non-Acute Institutional Stay (SKILLED_NURSING_FACILITY): Payer: Medicaid Other | Admitting: Adult Health

## 2016-05-19 DIAGNOSIS — R600 Localized edema: Secondary | ICD-10-CM | POA: Diagnosis not present

## 2016-05-19 DIAGNOSIS — E1142 Type 2 diabetes mellitus with diabetic polyneuropathy: Secondary | ICD-10-CM

## 2016-05-19 DIAGNOSIS — R627 Adult failure to thrive: Secondary | ICD-10-CM

## 2016-05-19 DIAGNOSIS — Z86718 Personal history of other venous thrombosis and embolism: Secondary | ICD-10-CM | POA: Diagnosis not present

## 2016-05-19 DIAGNOSIS — M170 Bilateral primary osteoarthritis of knee: Secondary | ICD-10-CM | POA: Diagnosis not present

## 2016-05-19 NOTE — Progress Notes (Signed)
Location:   [facility]   Place of Service:  SNF (31)   CODE STATUS: full code   No Known Allergies  Chief Complaint  Patient presents with  . Medical Management of Chronic Issues    HPI:  She is a long term resident of this facility being seen for the management of her chronic illnesses. She continues to get out of bed on a daily basis. She is unable to fully participate in the hpi or ros; but did tell me that she was doing well. There are no nursing concerns at this time.    Past Medical History:  Diagnosis Date  . Acute deep vein thrombosis (DVT) of left femoral vein (HCC) 02/22/2015  . Alcohol abuse   . Arthritis   . Dementia with behavioral problem   . Diabetes mellitus without complication (HCC)   . Dysphagia    and aspiration risk  . 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 Seizures (HCC)     No past surgical history on file.  Social History   Social History  . Marital status: Unknown    Spouse name: N/A  . Number of children: N/A  . Years of education: N/A   Occupational History  . Not on file.   Social History Main Topics  . Smoking status: Current Every Day Smoker    Packs/day: 0.50    Years: 15.00    Types: Cigarettes  . Smokeless tobacco: Never Used  . Alcohol use 1.2 oz/week    2 Cans of beer per week     Comment: occ  . Drug use: No  . Sexual activity: Not Currently   Other Topics Concern  . Not on file   Social History Narrative   ** Merged History Encounter **       No family history on file.    VITAL SIGNS BP 128/72   Pulse 69   Resp 18   Ht 5\' 6"  (1.676 m)   Wt 166 lb 3.2 oz (75.4 kg)   LMP 03/25/2015 (Approximate)   SpO2 98%   BMI 26.83 kg/m   Patient's Medications  New Prescriptions   No medications on file  Previous Medications   AMINO ACIDS-PROTEIN HYDROLYS (FEEDING SUPPLEMENT, PRO-STAT SUGAR FREE 64,) LIQD    Take 30 mLs by mouth 2 (two) times daily. 0800 and 1700   ASPIRIN 81 MG TABLET    Take 81 mg by mouth daily.    CHOLECALCIFEROL (VITAMIN D) 1000 UNITS TABLET    Take 1,000 Units by mouth daily. Reported on 10/28/2015   DICLOFENAC SODIUM (VOLTAREN) 1 % GEL    Apply 2 g topically 4 (four) times daily. To both knees   GABAPENTIN (NEURONTIN) 300 MG CAPSULE    Take 3 capsules (900 mg total) by mouth 3 (three) times daily.   MELATONIN 3 MG TABS    Take 3 mg by mouth at bedtime as needed (sleep).    METFORMIN (GLUCOPHAGE-XR) 500 MG 24 HR TABLET    Take 1,000 mg by mouth daily with breakfast.    MULTIPLE VITAMIN (MULTIVITAMIN WITH MINERALS) TABS TABLET    Take 2 tablets by mouth at bedtime.    OLANZAPINE (ZYPREXA) 10 MG TABLET    Take 10 mg by mouth at bedtime.    POLYETHYLENE GLYCOL (MIRALAX / GLYCOLAX) PACKET    Take 17 g by mouth daily.   SENNA (SENOKOT) 8.6 MG TABS TABLET    Take 2 tablets (17.2 mg total) by mouth daily.  THIAMINE 100 MG TABLET    Take 100 mg by mouth daily.   TORSEMIDE (DEMADEX) 10 MG TABLET    Take 10 mg by mouth daily.   TRAMADOL (ULTRAM) 50 MG TABLET    Take 1 tablet (50 mg total) by mouth 3 (three) times daily.  Modified Medications   No medications on file  Discontinued Medications   LORAZEPAM (ATIVAN) 0.5 MG TABLET    Take 1 tablet (0.5 mg total) by mouth every 8 (eight) hours as needed for anxiety.   PERMETHRIN (ELIMITE) 5 % CREAM    Apply 1 application topically once.     SIGNIFICANT DIAGNOSTIC EXAMS   Not a candidate for dexa; mammogram or colonoscopy    10-02-14: left leg doppler: +dvt  10-08-14: bilateral hip /pelvic fracture: right hip with marked osteoarthritis  04-23-15: right distal ulna and forearm: no acute fracture   10-28-15: left lower extremity doppler: negative for dvt   LABS REVIEWED:   07-14-15: glucose 141; bun 10.3; creat 0.58; k+ 4.1; na++143   08-25-15: wbc 12.8; hgb 11.9; hct 36.5; mcv 82.4; plt 299; glucose 99; bun 20; creat 0.62; k+ 4.3;na++142; liver normal albumin 3.7; urine culture: e-coli: ceftin; guaiac stool: +  08-28-15: wbc 9.4; hgb 11.1; hct  34.0; mcv 84.0; plt 282; glucose 110; bun 11; creat 0.66; k+ 4.0; na++140  09-28-15; wbc 10.1; hgb 11.8; hct 39.0; mcv 83.6; plt 298; glucose 187; bun 16.1; creat 0.65; k+ 3.8; na++ 140; liver normal albumin 3.9; tsh 1.46; hgb a1c 6.9; chol 143; ldl 48; trig 151; hdl 64  10-29-11: hgb a1c 6.8  12-15-15: glucose 11; bun 16.7; creat 0.53; k+ 4.0; na+ 141  02-23-16: wbc 9.7; hgb 10.7; hct 34.9; mcv 84.0; plt 257; glucose 103; bun 12.7; creat 0.53; k+ 4.1; na++ 143 hgb a1c 7.4    Review of Systems Unable to perform ROS: dementia    Physical Exam Constitutional: No distress.  Frail   Neck: Neck supple. No JVD present. No thyromegaly present.  Cardiovascular: Normal rate, regular rhythm and intact distal pulses.   Respiratory: Effort normal and breath sounds normal. No respiratory distress.  GI: Soft. Bowel sounds are normal. She exhibits no distension. There is no tenderness.  Musculoskeletal: 2 + lower extremity edema   Is able to move all extremities   Neurological: She is alert.  Skin: Skin is warm and dry. She is not diaphoretic.     ASSESSMENT/ PLAN:  1. Diabetes:  hgb a1c is 7.4  Urine for micro-albumin is 7.4;   Will continue metformin xr  1 gm daily   2. Dysphagia: no signs of aspiration present; will not make changes in her current plan of care and will monitor her status.   3. 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   4. Psychosis; due to alcohol: she is presently without change in status; will continue zyprexa 7.5 mg daily; thiamine 100 mg daily;  will monitor  5. 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 Her current weight  is 166 pounds.    6. DVT left leg:  since 10-02-14. will continue  asa 81 mg daily and will monitor  7. Constipation: will miralax daily and senna 2 tabs daily   8. Bilateral lower extremity edema: will continue demadex 10  mg daily     Sarah Innocenteborah Green NP [facility]  Contact [phone] Monday through Friday 8am- 5pm  After hours call [phone]

## 2016-06-26 ENCOUNTER — Non-Acute Institutional Stay (SKILLED_NURSING_FACILITY): Payer: Medicaid Other | Admitting: Internal Medicine

## 2016-06-26 ENCOUNTER — Encounter: Payer: Self-pay | Admitting: Internal Medicine

## 2016-06-26 DIAGNOSIS — F01518 Vascular dementia, unspecified severity, with other behavioral disturbance: Secondary | ICD-10-CM

## 2016-06-26 DIAGNOSIS — R600 Localized edema: Secondary | ICD-10-CM

## 2016-06-26 DIAGNOSIS — Z86718 Personal history of other venous thrombosis and embolism: Secondary | ICD-10-CM | POA: Diagnosis not present

## 2016-06-26 DIAGNOSIS — E1142 Type 2 diabetes mellitus with diabetic polyneuropathy: Secondary | ICD-10-CM | POA: Diagnosis not present

## 2016-06-26 DIAGNOSIS — F10959 Alcohol use, unspecified with alcohol-induced psychotic disorder, unspecified: Secondary | ICD-10-CM

## 2016-06-26 DIAGNOSIS — F0151 Vascular dementia with behavioral disturbance: Secondary | ICD-10-CM

## 2016-06-26 DIAGNOSIS — R627 Adult failure to thrive: Secondary | ICD-10-CM

## 2016-06-26 DIAGNOSIS — M170 Bilateral primary osteoarthritis of knee: Secondary | ICD-10-CM

## 2016-06-26 NOTE — Progress Notes (Signed)
Patient ID: Sarah Mckay, female   DOB: Jun 01, 1955, 61 y.o.   MRN: [id number]    DATE:  06/26/2016  Location:    [facility] Room Number: 204 B Place of Service: SNF (31)   Extended Emergency Contact Information Primary Emergency Contact: Peach,Marcus Address: [address]          [place], [place] [address] [place] Home Phone: [phone] Mobile Phone: [phone] Relation: Brother Secondary Emergency Contact: Dewaine Oats [place] Home Phone: [phone] Relation: Niece  Advanced Directive information Does Patient Have a Medical Advance Directive?: Yes, Type of Advance Directive: Out of facility DNR (pink MOST or yellow form), Pre-existing out of facility DNR order (yellow form or pink MOST form): Pink MOST form placed in chart (order not valid for inpatient use)  Chief Complaint  Patient presents with  . Medical Management of Chronic Issues    Routine visit    HPI:  61 yo female long term resident seen today for f/u. She c/o LLE swelling but has a known DVT in LLE. No other concerns. She is a poor historian due to dementia. Hx obtained from chart. No nursing concerns. No falls. Appetite ok and sleeps well  DM - CBG 288.  A1c 7.4%.  Urine micro-albumin is 7.4. She takes metformin xr  1 gm daily. 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 She does not take any meds for cognition. Weight is 168 lbs    Hx DVT left leg - dx on 10-02-14. Takes ASA 81 mg daily   Constipation - stable on miralax daily and senna 2 tabs daily   Bilateral lower extremity  edema - exacerbated; takes demadex 10  mg daily     Past Medical History:  Diagnosis Date  . Acute deep vein thrombosis (DVT) of left femoral vein (HCC) 02/22/2015  . Alcohol abuse   . Arthritis   . Dementia with behavioral problem   . Diabetes mellitus without complication (HCC)   . Dysphagia    and aspiration risk  . 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 Seizures (HCC)     History reviewed. No pertinent surgical history.  Patient Care Team: Kirt Boys, DO as PCP - General (Internal Medicine) Sharee Holster, NP as Nurse Practitioner (Nurse Practitioner)  Social History   Social History  . Marital status: Unknown    Spouse name: N/A  . Number of children: N/A  . Years of education: N/A   Occupational History  . Not on file.   Social History Main Topics  . Smoking status: Current Every Day Smoker    Packs/day: 0.50    Years: 15.00    Types: Cigarettes  . Smokeless tobacco: Never Used  . Alcohol use 1.2 oz/week    2 Cans of beer per week     Comment: occ  . Drug use: No  . Sexual activity: Not Currently   Other Topics Concern  . Not on file   Social History Narrative   ** Merged History Encounter **  reports that she has been smoking Cigarettes.  She has a 7.50 pack-year smoking history. She has never used smokeless tobacco. She reports that she drinks about 1.2 oz of alcohol per week . She reports that she does not use drugs.  History reviewed. No pertinent family history. No family status information on file.    Immunization History  Administered Date(s) Administered  . Influenza-Unspecified 07/06/2015, 03/02/2016  . PPD Test 02/11/2016, 02/18/2016  . Pneumococcal-Unspecified 06/16/2014  . Tdap 01/18/2014    No Known Allergies  Medications: Patient's Medications  New Prescriptions   No medications on file  Previous Medications   AMINO ACIDS-PROTEIN HYDROLYS (FEEDING SUPPLEMENT, PRO-STAT SUGAR FREE 64,) LIQD    Take 30 mLs by mouth 2  (two) times daily. 0800 and 1700   ASPIRIN 81 MG TABLET    Take 81 mg by mouth daily.   CHOLECALCIFEROL (VITAMIN D) 1000 UNITS TABLET    Take 1,000 Units by mouth daily. Reported on 10/28/2015   DICLOFENAC SODIUM (VOLTAREN) 1 % GEL    Apply 2 g topically 4 (four) times daily. To both knees   GABAPENTIN (NEURONTIN) 300 MG CAPSULE    Take 3 capsules (900 mg total) by mouth 3 (three) times daily.   LORAZEPAM (ATIVAN) 0.5 MG TABLET    Take 0.5 mg by mouth every 8 (eight) hours as needed for anxiety.   MELATONIN 3 MG TABS    Take 3 mg by mouth at bedtime as needed (sleep).    METFORMIN (GLUCOPHAGE-XR) 500 MG 24 HR TABLET    Take 1,000 mg by mouth daily with breakfast.    MULTIPLE VITAMIN (MULTIVITAMIN WITH MINERALS) TABS TABLET    Take 2 tablets by mouth at bedtime.    OLANZAPINE (ZYPREXA) 15 MG TABLET    Take 15 mg by mouth at bedtime.    POLYETHYLENE GLYCOL (MIRALAX / GLYCOLAX) PACKET    Take 17 g by mouth daily.   SENNA (SENOKOT) 8.6 MG TABS TABLET    Take 2 tablets (17.2 mg total) by mouth daily.   THIAMINE 100 MG TABLET    Take 100 mg by mouth daily.   TORSEMIDE (DEMADEX) 10 MG TABLET    Take 10 mg by mouth daily.   TRAMADOL (ULTRAM) 50 MG TABLET    Take 1 tablet (50 mg total) by mouth 3 (three) times daily.  Modified Medications   No medications on file  Discontinued Medications   No medications on file    Review of Systems  Unable to perform ROS: Dementia    Vitals:   06/26/16 1239  BP: (!) 146/68  Pulse: (!) 101  Resp: 18  Temp: 97.3 F (36.3 C)  TempSrc: Oral  SpO2: 99%  Weight: 168 lb 1.6 oz (76.2 kg)  Height: 5\' 6"  (1.676 m)   Body mass index is 27.13 kg/m.  Physical Exam  Constitutional: She appears well-developed.  Frail appearing sitting up in bed in nAD  HENT:  Mouth/Throat: Oropharynx is clear and moist. No oropharyngeal exudate.  Eyes: Pupils are equal, round, and reactive to light. No scleral icterus.  Neck: Neck supple. Carotid bruit is not present. No  tracheal deviation present.  Cardiovascular: Normal rate, regular rhythm and intact distal pulses.  Exam reveals no gallop and no friction rub.   Murmur (1/6 SEM) heard. LLE swelling with calf swelling. No RLE swelling. No calf TTP b/l  Pulmonary/Chest: Effort normal and breath sounds normal. No stridor. No respiratory distress. She has no wheezes. She has no  rales.  Abdominal: Soft. Bowel sounds are normal. She exhibits distension. She exhibits no mass. There is no hepatomegaly. There is no tenderness. There is no rebound and no guarding.  Musculoskeletal: She exhibits edema.  Lymphadenopathy:    She has no cervical adenopathy.  Neurological: She is alert.  Skin: Skin is warm and dry. No rash noted.  Psychiatric: She has a normal mood and affect. Her behavior is normal.     Labs reviewed: Nursing Home on 04/17/2016  Component Date Value Ref Range Status  . Microalb, Ur 03/23/2016 1.7   Final  . Glucose 04/04/2016 142  mg/dL Final  . BUN 16/02/9603 21  4 - 21 mg/dL Final  . Creatinine 54/01/8118 0.7  0.5 - 1.1 mg/dL Final  . Potassium 14/78/2956 3.8  3.4 - 5.3 mmol/L Final  . Sodium 04/04/2016 148* 137 - 147 mmol/L Final    No results found.   Assessment/Plan   ICD-9-CM ICD-10-CM   1. Type 2 diabetes mellitus with diabetic polyneuropathy, without long-term current use of insulin (HCC) 250.60 E11.42    357.2    2. History of DVT (deep vein thrombosis) V12.51 Z86.718    LLE  3. FTT (failure to thrive) in adult 783.7 R62.7   4. Vascular dementia with behavior disturbance 290.40 F01.51   5. Primary osteoarthritis of both knees 715.16 M17.0   6. Alcohol-induced psychosis with complication (HCC) 291.9 F10.959    Keep legs elevated when seated  Check A1c and lipid panel  Cont other meds as ordered  Cont nutritional supplements as ordered  PT/OT/ST as indicated  Will follow  Hadasah Brugger S. Ancil Linsey  [facility] [address] [place], [place] [address] [phone] Cell (Monday-Friday 8 AM - 5 PM) [phone] After 5 PM and follow prompts

## 2016-07-26 ENCOUNTER — Non-Acute Institutional Stay (SKILLED_NURSING_FACILITY): Payer: Medicaid Other | Admitting: Adult Health

## 2016-07-26 ENCOUNTER — Encounter: Payer: Self-pay | Admitting: Adult Health

## 2016-07-26 DIAGNOSIS — F10959 Alcohol use, unspecified with alcohol-induced psychotic disorder, unspecified: Secondary | ICD-10-CM | POA: Diagnosis not present

## 2016-07-26 DIAGNOSIS — F0151 Vascular dementia with behavioral disturbance: Secondary | ICD-10-CM

## 2016-07-26 DIAGNOSIS — I82412 Acute embolism and thrombosis of left femoral vein: Secondary | ICD-10-CM

## 2016-07-26 DIAGNOSIS — R600 Localized edema: Secondary | ICD-10-CM | POA: Diagnosis not present

## 2016-07-26 DIAGNOSIS — E1142 Type 2 diabetes mellitus with diabetic polyneuropathy: Secondary | ICD-10-CM | POA: Diagnosis not present

## 2016-07-26 DIAGNOSIS — K5901 Slow transit constipation: Secondary | ICD-10-CM | POA: Diagnosis not present

## 2016-07-26 DIAGNOSIS — G8929 Other chronic pain: Secondary | ICD-10-CM | POA: Diagnosis not present

## 2016-07-26 DIAGNOSIS — F01518 Vascular dementia, unspecified severity, with other behavioral disturbance: Secondary | ICD-10-CM

## 2016-07-26 NOTE — Progress Notes (Signed)
Location:   [facility] Room Number: 204 B Place of Service:  SNF (31)   CODE STATUS: Full Code  No Known Allergies  Chief Complaint  Patient presents with  . Medical Management of Chronic Issues    Routine Visit    HPI:  She is a long term resident of this facility being seen for the management of her chronic illnesses. She status is stable. She does get out of bed daily. She is unable to participate in the hpi or ros. There are no nursing concerns at this time.   Past Medical History:  Diagnosis Date  . Acute deep vein thrombosis (DVT) of left femoral vein (HCC) 02/22/2015  . Alcohol abuse   . Arthritis   . Dementia with behavioral problem   . Diabetes mellitus without complication (HCC)   . Dysphagia    and aspiration risk  . 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 Seizures (HCC)     History reviewed. No pertinent surgical history.  Social History   Social History  . Marital status: Unknown    Spouse name: N/A  . Number of children: N/A  . Years of education: N/A   Occupational History  . Not on file.   Social History Main Topics  . Smoking status: Current Every Day Smoker    Packs/day: 0.50    Years: 15.00    Types: Cigarettes  . Smokeless tobacco: Never Used  . Alcohol use 1.2 oz/week    2 Cans of beer per week     Comment: occ  . Drug use: No  . Sexual activity: Not Currently   Other Topics Concern  . Not on file   Social History Narrative   ** Merged History Encounter **       History reviewed. No pertinent family history.    VITAL SIGNS BP (!) 122/58   Pulse 72   Temp 97.9 F (36.6 C)   Resp 17   Ht 5\' 2"  (1.575 m)   Wt 168 lb 2 oz (76.3 kg)   LMP 03/25/2015 (Approximate)   SpO2 97%   BMI 30.75 kg/m   Patient's Medications  New Prescriptions   No medications on file  Previous Medications   AMINO ACIDS-PROTEIN HYDROLYS (FEEDING SUPPLEMENT, PRO-STAT SUGAR FREE 64,) LIQD    Take 30 mLs by mouth 2 (two) times daily. 0800  and 1700   ASPIRIN 81 MG TABLET    Take 81 mg by mouth daily.   CHOLECALCIFEROL (VITAMIN D) 1000 UNITS TABLET    Take 1,000 Units by mouth daily. Reported on 10/28/2015   DICLOFENAC SODIUM (VOLTAREN) 1 % GEL    Apply 2 g topically 4 (four) times daily. To both knees   GABAPENTIN (NEURONTIN) 300 MG CAPSULE    Take 3 capsules (900 mg total) by mouth 3 (three) times daily.   LORAZEPAM (ATIVAN) 0.5 MG TABLET    Take 0.5 mg by mouth every 8 (eight) hours as needed for anxiety.   MELATONIN 3 MG TABS    Take 3 mg by mouth at bedtime as needed (sleep).    METFORMIN (GLUCOPHAGE-XR) 500 MG 24 HR TABLET    Take 1,000 mg by mouth daily with breakfast.    MULTIPLE VITAMIN (MULTIVITAMIN WITH MINERALS) TABS TABLET    Take 2 tablets by mouth at bedtime.    OLANZAPINE (ZYPREXA) 15 MG TABLET    Take 15 mg by mouth at bedtime.    POLYETHYLENE GLYCOL (MIRALAX / GLYCOLAX) PACKET  Take 17 g by mouth daily.   SENNA (SENOKOT) 8.6 MG TABS TABLET    Take 2 tablets (17.2 mg total) by mouth daily.   THIAMINE 100 MG TABLET    Take 100 mg by mouth daily.   TORSEMIDE (DEMADEX) 10 MG TABLET    Take 10 mg by mouth daily.   TRAMADOL (ULTRAM) 50 MG TABLET    Take 1 tablet (50 mg total) by mouth 3 (three) times daily.  Modified Medications   No medications on file  Discontinued Medications   No medications on file     SIGNIFICANT DIAGNOSTIC EXAMS  ot a candidate for dexa; mammogram or colonoscopy    10-02-14: left leg doppler: +dvt  10-08-14: bilateral hip /pelvic fracture: right hip with marked osteoarthritis  04-23-15: right distal ulna and forearm: no acute fracture   10-28-15: left lower extremity doppler: negative for dvt   LABS REVIEWED:   07-14-15: glucose 141; bun 10.3; creat 0.58; k+ 4.1; na++143   08-25-15: wbc 12.8; hgb 11.9; hct 36.5; mcv 82.4; plt 299; glucose 99; bun 20; creat 0.62; k+ 4.3;na++142; liver normal albumin 3.7; urine culture: e-coli: ceftin; guaiac stool: +  08-28-15: wbc 9.4; hgb 11.1; hct  34.0; mcv 84.0; plt 282; glucose 110; bun 11; creat 0.66; k+ 4.0; na++140  09-28-15; wbc 10.1; hgb 11.8; hct 39.0; mcv 83.6; plt 298; glucose 187; bun 16.1; creat 0.65; k+ 3.8; na++ 140; liver normal albumin 3.9; tsh 1.46; hgb a1c 6.9; chol 143; ldl 48; trig 151; hdl 64  06-03-08: hgb a1c 6.8  12-15-15: glucose 11; bun 16.7; creat 0.53; k+ 4.0; na+ 141  02-23-16: wbc 9.7; hgb 10.7; hct 34.9; mcv 84.0; plt 257; glucose 103; bun 12.7; creat 0.53; k+ 4.1; na++ 143 hgb a1c 7.4  06-27-16: chol 147; ldl 69; trig 122; hdl 53; hgb a1c 7.3      Review of Systems Unable to perform ROS: dementia    Physical Exam Constitutional: No distress.  Frail   Neck: Neck supple. No JVD present. No thyromegaly present.  Cardiovascular: Normal rate, regular rhythm and intact distal pulses.   Respiratory: Effort normal and breath sounds normal. No respiratory distress.  GI: Soft. Bowel sounds are normal. She exhibits no distension. There is no tenderness.  Musculoskeletal: 2 + lower extremity edema   Is able to move all extremities   Neurological: She is alert.  Skin: Skin is warm and dry. She is not diaphoretic.     ASSESSMENT/ PLAN:  1. Diabetes:  hgb a1c is 7.4  Urine for micro-albumin is 7.4;   Will continue metformin xr  1 gm daily   2. Dysphagia: no signs of aspiration present; will not make changes in her current plan of care and will monitor her status.   3. 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   4. Psychosis; due to alcohol: she is presently without change in status; will continue zyprexa 15 mg daily; thiamine 100 mg daily;  will monitor  5. 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 Her current weight is 166 pounds.    6. DVT left leg:  since 10-02-14. will continue  asa 81 mg daily and will monitor  7. Constipation: will miralax daily and senna 2 tabs daily   8. Bilateral lower extremity edema: will continue demadex 10  mg daily    MD is aware of resident's narcotic use and is in agreement with current plan of care. We will attempt to wean resident as apropriate     Synthia Innocenteborah Greycen Felter NP [facility]  Contact [phone] Monday through Friday 8am- 5pm  After hours call [phone]

## 2016-07-27 LAB — LIPID PANEL
Cholesterol: 153 mg/dL (ref 0–200)
HDL: 55 mg/dL (ref 35–70)
LDL Cholesterol: 66 mg/dL
Triglycerides: 164 mg/dL — AB (ref 40–160)

## 2016-07-27 LAB — HEMOGLOBIN A1C: Hemoglobin A1C: 7.1

## 2016-08-08 ENCOUNTER — Non-Acute Institutional Stay (SKILLED_NURSING_FACILITY): Payer: Medicaid Other | Admitting: Adult Health

## 2016-08-08 ENCOUNTER — Encounter: Payer: Self-pay | Admitting: Adult Health

## 2016-08-08 DIAGNOSIS — I82412 Acute embolism and thrombosis of left femoral vein: Secondary | ICD-10-CM

## 2016-08-08 NOTE — Progress Notes (Signed)
Location:   [facility] Room Number: 294 B Place of Service:  SNF (31)   CODE STATUS: Full Code  No Known Allergies  Chief Complaint  Patient presents with  . Acute Visit    Left leg pain and swelling    HPI:  Staff reports that she has a painful left lower leg with swelling present. She tells me that she is not feeling good today. She does have a history of dvt in her left leg in the past.   Past Medical History:  Diagnosis Date  . Acute deep vein thrombosis (DVT) of left femoral vein (HCC) 02/22/2015  . Alcohol abuse   . Arthritis   . Dementia with behavioral problem   . Diabetes mellitus without complication (HCC)   . Dysphagia    and aspiration risk  . 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 Seizures (HCC)     History reviewed. No pertinent surgical history.  Social History   Social History  . Marital status: Unknown    Spouse name: N/A  . Number of children: N/A  . Years of education: N/A   Occupational History  . Not on file.   Social History Main Topics  . Smoking status: Current Every Day Smoker    Packs/day: 0.50    Years: 15.00    Types: Cigarettes  . Smokeless tobacco: Never Used  . Alcohol use 1.2 oz/week    2 Cans of beer per week     Comment: occ  . Drug use: No  . Sexual activity: Not Currently   Other Topics Concern  . Not on file   Social History Narrative   ** Merged History Encounter **       History reviewed. No pertinent family history.    VITAL SIGNS BP 136/64   Pulse 72   Temp 97.9 F (36.6 C)   Resp 17   Ht 5\' 2"  (1.575 m)   LMP 03/25/2015 (Approximate)   SpO2 97%   Patient's Medications  New Prescriptions   No medications on file  Previous Medications   AMINO ACIDS-PROTEIN HYDROLYS (FEEDING SUPPLEMENT, PRO-STAT SUGAR FREE 64,) LIQD    Take 30 mLs by mouth 2 (two) times daily. 0800 and 1700   ASPIRIN 81 MG TABLET    Take 81 mg by mouth daily.   CHOLECALCIFEROL (VITAMIN D) 1000 UNITS TABLET    Take 1,000  Units by mouth daily. Reported on 10/28/2015   DICLOFENAC SODIUM (VOLTAREN) 1 % GEL    Apply 2 g topically 4 (four) times daily. To both knees   GABAPENTIN (NEURONTIN) 300 MG CAPSULE    Take 3 capsules (900 mg total) by mouth 3 (three) times daily.   LORAZEPAM (ATIVAN) 0.5 MG TABLET    Take 0.5 mg by mouth every 8 (eight) hours as needed for anxiety.   MELATONIN 3 MG TABS    Take 3 mg by mouth at bedtime as needed (sleep).    METFORMIN (GLUCOPHAGE-XR) 500 MG 24 HR TABLET    Take 1,000 mg by mouth daily with breakfast.    MULTIPLE VITAMINS-MINERALS (DECUBI-VITE) CAPS    Take 2 capsules by mouth at bedtime.   OLANZAPINE (ZYPREXA) 15 MG TABLET    Take 15 mg by mouth at bedtime.    POLYETHYLENE GLYCOL (MIRALAX / GLYCOLAX) PACKET    Take 17 g by mouth daily.   SENNA (SENOKOT) 8.6 MG TABS TABLET    Take 2 tablets (17.2 mg total) by mouth daily.  THIAMINE 100 MG TABLET    Take 100 mg by mouth daily.   TORSEMIDE (DEMADEX) 10 MG TABLET    Take 10 mg by mouth daily.   TRAMADOL (ULTRAM) 50 MG TABLET    Take 1 tablet (50 mg total) by mouth 3 (three) times daily.  Modified Medications   No medications on file  Discontinued Medications   MULTIPLE VITAMIN (MULTIVITAMIN WITH MINERALS) TABS TABLET    Take 2 tablets by mouth at bedtime.      SIGNIFICANT DIAGNOSTIC EXAMS  Not a candidate for dexa; mammogram or colonoscopy    10-02-14: left leg doppler: +dvt  10-08-14: bilateral hip /pelvic fracture: right hip with marked osteoarthritis  04-23-15: right distal ulna and forearm: no acute fracture   10-28-15: left lower extremity doppler: negative for dvt   LABS REVIEWED:    08-25-15: wbc 12.8; hgb 11.9; hct 36.5; mcv 82.4; plt 299; glucose 99; bun 20; creat 0.62; k+ 4.3;na++142; liver normal albumin 3.7; urine culture: e-coli: ceftin; guaiac stool: +  08-28-15: wbc 9.4; hgb 11.1; hct 34.0; mcv 84.0; plt 282; glucose 110; bun 11; creat 0.66; k+ 4.0; na++140  09-28-15; wbc 10.1; hgb 11.8; hct 39.0; mcv 83.6; plt  298; glucose 187; bun 16.1; creat 0.65; k+ 3.8; na++ 140; liver normal albumin 3.9; tsh 1.46; hgb a1c 6.9; chol 143; ldl 48; trig 151; hdl 64  1-6-105-4-17: hgb a1c 6.8  12-15-15: glucose 11; bun 16.7; creat 0.53; k+ 4.0; na+ 141  02-23-16: wbc 9.7; hgb 10.7; hct 34.9; mcv 84.0; plt 257; glucose 103; bun 12.7; creat 0.53; k+ 4.1; na++ 143 hgb a1c 7.4    Review of Systems Unable to perform ROS: dementia    Physical Exam Constitutional: No distress.  Frail   Neck: Neck supple. No JVD present. No thyromegaly present.  Cardiovascular: Normal rate, regular rhythm and intact distal pulses.   Respiratory: Effort normal and breath sounds normal. No respiratory distress.  GI: Soft. Bowel sounds are normal. She exhibits no distension. There is no tenderness.  Musculoskeletal: 1 + lower extremity edema   Is able to move all extremities  Left lower left is warm; tender; swelling present.   Neurological: She is alert.  Skin: Skin is warm and dry. She is not diaphoretic.     ASSESSMENT/ PLAN:   1. DVT left leg:  Is presently on asa will get a venous doppler and will treat as indicated.    MD is aware of resident's narcotic use and is in agreement with current plan of care. We will attempt to wean resident as apropriate     Synthia Innocenteborah Casara Perrier NP [facility]  Contact [phone] Monday through Friday 8am- 5pm  After hours call [phone]

## 2016-08-23 ENCOUNTER — Non-Acute Institutional Stay (SKILLED_NURSING_FACILITY): Payer: Medicaid Other | Admitting: Adult Health

## 2016-08-23 ENCOUNTER — Encounter: Payer: Self-pay | Admitting: Adult Health

## 2016-08-23 DIAGNOSIS — M170 Bilateral primary osteoarthritis of knee: Secondary | ICD-10-CM

## 2016-08-23 DIAGNOSIS — E1142 Type 2 diabetes mellitus with diabetic polyneuropathy: Secondary | ICD-10-CM

## 2016-08-23 DIAGNOSIS — R600 Localized edema: Secondary | ICD-10-CM

## 2016-08-23 DIAGNOSIS — F0151 Vascular dementia with behavioral disturbance: Secondary | ICD-10-CM | POA: Diagnosis not present

## 2016-08-23 DIAGNOSIS — I82412 Acute embolism and thrombosis of left femoral vein: Secondary | ICD-10-CM | POA: Diagnosis not present

## 2016-08-23 DIAGNOSIS — F01518 Vascular dementia, unspecified severity, with other behavioral disturbance: Secondary | ICD-10-CM

## 2016-08-23 DIAGNOSIS — D649 Anemia, unspecified: Secondary | ICD-10-CM

## 2016-08-23 DIAGNOSIS — F10959 Alcohol use, unspecified with alcohol-induced psychotic disorder, unspecified: Secondary | ICD-10-CM

## 2016-08-23 NOTE — Progress Notes (Signed)
Location:   [facility] Room Number: 204 B Place of Service:  SNF (31)   CODE STATUS: Full code  No Known Allergies  Chief Complaint  Patient presents with  . Medical Management of Chronic Issues    1 month follow up    HPI:  She is a long term resident of this facility being seen for the management of her chronic illnesses. Overall her status is without significant change. She has been started on xarelto once again for a second dvt in her left lower extremity. She does get out of bed daily. She is unable to fully participate in the hpi or ros; but does tell me that she is feeling good. There are no nursing concerns at this time.    Past Medical History:  Diagnosis Date  . Acute deep vein thrombosis (DVT) of left femoral vein (HCC) 02/22/2015  . Alcohol abuse   . Arthritis   . Dementia with behavioral problem   . Diabetes mellitus without complication (HCC)   . Dysphagia    and aspiration risk  . 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 Seizures (HCC)     History reviewed. No pertinent surgical history.  Social History   Social History  . Marital status: Unknown    Spouse name: N/A  . Number of children: N/A  . Years of education: N/A   Occupational History  . Not on file.   Social History Main Topics  . Smoking status: Current Every Day Smoker    Packs/day: 0.50    Years: 15.00    Types: Cigarettes  . Smokeless tobacco: Never Used  . Alcohol use 1.2 oz/week    2 Cans of beer per week     Comment: occ  . Drug use: No  . Sexual activity: Not Currently   Other Topics Concern  . Not on file   Social History Narrative   ** Merged History Encounter **       History reviewed. No pertinent family history.    VITAL SIGNS BP 124/66   Pulse 69   Temp 98 F (36.7 C)   Resp 17   Ht 5\' 2"  (1.575 m)   LMP 03/25/2015 (Approximate)   SpO2 97%   Patient's Medications  New Prescriptions   No medications on file  Previous Medications   AMINO  ACIDS-PROTEIN HYDROLYS (FEEDING SUPPLEMENT, PRO-STAT SUGAR FREE 64,) LIQD    Take 30 mLs by mouth 2 (two) times daily. 0800 and 1700   ASPIRIN 81 MG TABLET    Take 81 mg by mouth daily.   CHOLECALCIFEROL (VITAMIN D) 1000 UNITS TABLET    Take 1,000 Units by mouth daily. Reported on 10/28/2015   DICLOFENAC SODIUM (VOLTAREN) 1 % GEL    Apply 2 g topically 4 (four) times daily. To both knees   GABAPENTIN (NEURONTIN) 300 MG CAPSULE    Take 3 capsules (900 mg total) by mouth 3 (three) times daily.   LORAZEPAM (ATIVAN) 0.5 MG TABLET    Take 0.5 mg by mouth every 8 (eight) hours as needed for anxiety.   MELATONIN 3 MG TABS    Take 3 mg by mouth at bedtime as needed (sleep).    METFORMIN (GLUCOPHAGE-XR) 500 MG 24 HR TABLET    Take 1,000 mg by mouth daily with breakfast.    MULTIPLE VITAMINS-MINERALS (DECUBI-VITE) CAPS    Take 2 capsules by mouth at bedtime.   OLANZAPINE (ZYPREXA) 15 MG TABLET    Take 15 mg  by mouth at bedtime.    POLYETHYLENE GLYCOL (MIRALAX / GLYCOLAX) PACKET    Take 17 g by mouth daily.   RIVAROXABAN (XARELTO) 15 MG TABS TABLET    Take 15 mg by mouth 2 (two) times daily with a meal.   SENNA (SENOKOT) 8.6 MG TABS TABLET    Take 2 tablets (17.2 mg total) by mouth daily.   THIAMINE 100 MG TABLET    Take 100 mg by mouth daily.   TORSEMIDE (DEMADEX) 10 MG TABLET    Take 10 mg by mouth daily.   TRAMADOL (ULTRAM) 50 MG TABLET    Take 1 tablet (50 mg total) by mouth 3 (three) times daily.  Modified Medications   No medications on file  Discontinued Medications   No medications on file     SIGNIFICANT DIAGNOSTIC EXAMS  Not a candidate for dexa; mammogram or colonoscopy    10-02-14: left leg doppler: +dvt  10-08-14: bilateral hip /pelvic fracture: right hip with marked osteoarthritis  04-23-15: right distal ulna and forearm: no acute fracture   10-28-15: left lower extremity doppler: negative for dvt  08-08-16: left lower extremity doppler: + DVT   LABS REVIEWED:    08-25-15: wbc  12.8; hgb 11.9; hct 36.5; mcv 82.4; plt 299; glucose 99; bun 20; creat 0.62; k+ 4.3;na++142; liver normal albumin 3.7; urine culture: e-coli: ceftin; guaiac stool: +  08-28-15: wbc 9.4; hgb 11.1; hct 34.0; mcv 84.0; plt 282; glucose 110; bun 11; creat 0.66; k+ 4.0; na++140  09-28-15; wbc 10.1; hgb 11.8; hct 39.0; mcv 83.6; plt 298; glucose 187; bun 16.1; creat 0.65; k+ 3.8; na++ 140; liver normal albumin 3.9; tsh 1.46; hgb a1c 6.9; chol 143; ldl 48; trig 151; hdl 64  9-1-475-4-17: hgb a1c 6.8  12-15-15: glucose 11; bun 16.7; creat 0.53; k+ 4.0; na+ 141  02-23-16: wbc 9.7; hgb 10.7; hct 34.9; mcv 84.0; plt 257; glucose 103; bun 12.7; creat 0.53; k+ 4.1; na++ 143 hgb a1c 7.4  07-27-16: hgb a1c 7.1; chol 153; ldl 66; trig 164; hdl 55     Review of Systems Unable to perform ROS: dementia    Physical Exam Constitutional: No distress.  Frail   Neck: Neck supple. No JVD present. No thyromegaly present.  Cardiovascular: Normal rate, regular rhythm pedal pulses faint .   Respiratory: Effort normal and breath sounds normal. No respiratory distress.  GI: Soft. Bowel sounds are normal. She exhibits no distension. There is no tenderness.  Musculoskeletal: 1 + left lower extremity edema   Is able to move all extremities  Neurological: She is alert.  Skin: Skin is warm and dry. She is not diaphoretic.     ASSESSMENT/ PLAN:    1. Diabetes:  hgb a1c is 7.1  Urine for micro-albumin is 7.4;   Will continue metformin xr  1 gm daily   2. Dysphagia: no signs of aspiration present; will not make changes in her current plan of care and will monitor her status.   3. 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   4. Psychosis; due to alcohol: she is presently without change in status; will  continue zyprexa 15 mg daily; thiamine 100 mg daily;  will monitor  5. 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 Her current weight is 166 pounds.    6. DVT left leg:  (08-08-16; #2) will continue xarelto15 mg twice daily for total of 21 days then 20 mg nightly she will need this long term therapy.   7. Constipation: will miralax daily and senna 2 tabs daily   8. Bilateral lower extremity edema: will continue demadex 10  mg daily   9. Anemia: hgb 10.7; will monitor   MD is aware of resident's narcotic use and is in agreement with current plan of care. We will attempt to wean resident as apropriate     Synthia Innocent NP [facility]  Contact [phone] Monday through Friday 8am- 5pm  After hours call [phone]

## 2016-09-11 LAB — HM DIABETES EYE EXAM

## 2016-09-19 ENCOUNTER — Non-Acute Institutional Stay (SKILLED_NURSING_FACILITY): Payer: Medicaid Other | Admitting: Internal Medicine

## 2016-09-19 DIAGNOSIS — F01518 Vascular dementia, unspecified severity, with other behavioral disturbance: Secondary | ICD-10-CM

## 2016-09-19 DIAGNOSIS — D649 Anemia, unspecified: Secondary | ICD-10-CM

## 2016-09-19 DIAGNOSIS — I82412 Acute embolism and thrombosis of left femoral vein: Secondary | ICD-10-CM

## 2016-09-19 DIAGNOSIS — R600 Localized edema: Secondary | ICD-10-CM

## 2016-09-19 DIAGNOSIS — E1142 Type 2 diabetes mellitus with diabetic polyneuropathy: Secondary | ICD-10-CM | POA: Diagnosis not present

## 2016-09-19 DIAGNOSIS — F10959 Alcohol use, unspecified with alcohol-induced psychotic disorder, unspecified: Secondary | ICD-10-CM | POA: Diagnosis not present

## 2016-09-19 DIAGNOSIS — F0151 Vascular dementia with behavioral disturbance: Secondary | ICD-10-CM

## 2016-09-19 NOTE — Progress Notes (Signed)
This is an annual history and physical.  Level care skilled.  Facility is [facility] complaint-annual history and physical.  History of present illness.  Patient is a 61 year old female  who is long-term resident at this facility.  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  She also has a history of psychosis is followed by psychiatric services-her Zyprexa was increased back in January secondary to increased symptoms apparently this has had a benefici effect.  She also was treated for case of scabies back in November 2017 and this apparently resolved with topical treatment  She does have a history of diabetes type 2 she is on Glucophage appears hemoglobin A1c back in March 2018 was 7.1 which shows relative stability.--Her CBGs appear to be mainly in the lower 100s occasional spikes but this does not appear to be consistent  She also has a history of a right buttocks wound that has resolved.  Her weights continue to be variable it appears through most of late last year she was around 160 but January dropped about 20 pounds between January and [facility] February her weight has been stable at around 147-however it appears her weights earlier last year  was 150 which is comparable to what her current weight is.       Currently she is sitting in her wheelchair currently does not have any acute complaints vital signs appear to be stable.  Past Medical History:  Diagnosis Date  . Acute deep vein thrombosis (DVT) of left femoral vein (HCC) 02/22/2015  . Alcohol abuse   . Arthritis   . Dementia with behavioral problem   . Diabetes mellitus without complication (HCC)   . Dysphagia    and aspiration risk  . 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 Seizures (HCC)     History reviewed. No pertinent surgical history.  Social History        Social History  . Marital status: Unknown    Spouse name: N/A  . Number of children: N/A  . Years of education: N/A      Occupational History  . Not on file.         Social History Main Topics  . Smoking status: Current Every Day Smoker    Packs/day: 0.50    Years: 15.00    Types: Cigarettes  . Smokeless tobacco: Never Used  . Alcohol use 1.2 oz/week    2 Cans of beer per week     Comment: occ  . Drug use: No  . Sexual activity: Not Currently   Other Topics Concern  . Not on file      Social History Narrative   ** Merged History Encounter **       History reviewed. No pertinent family history.    VITAL SIGNS  Temperature is 98.0 pulse 66 respirations 18 blood pressure 118/74 weight is 147.2      Patient's Medications  New Prescriptions   No medications on file  Previous Medications   AMINO ACIDS-PROTEIN HYDROLYS (FEEDING SUPPLEMENT, PRO-STAT SUGAR FREE 64,) LIQD    Take 30 mLs by mouth 2 (two) times daily. 0800 and 1700   ASPIRIN 81 MG TABLET    Take 81 mg by mouth daily.   CHOLECALCIFEROL (VITAMIN D) 1000  UNITS TABLET    Take 1,000 Units by mouth daily. Reported on 10/28/2015   DICLOFENAC SODIUM (VOLTAREN) 1 % GEL    Apply 2 g topically 4 (four) times daily. To both knees   GABAPENTIN (NEURONTIN) 300 MG CAPSULE    Take 3 capsules (900 mg total) by mouth 3 (three) times daily.   LORAZEPAM (ATIVAN) 0.5 MG TABLET    Take 0.5 mg by mouth every 8 (eight) hours as needed for anxiety.   MELATONIN 3 MG TABS    Take 3 mg by mouth at bedtime as needed (sleep).    METFORMIN (GLUCOPHAGE-XR) 500 MG 24 HR TABLET    Take 1,000 mg by mouth daily with breakfast.    MULTIPLE VITAMINS-MINERALS (DECUBI-VITE) CAPS    Take 2 capsules by mouth at bedtime.   OLANZAPINE (ZYPREXA) 15 MG TABLET    Take 15 mg by mouth at bedtime.    POLYETHYLENE  GLYCOL (MIRALAX / GLYCOLAX) PACKET    Take 17 g by mouth daily.   Xarelto-20 mg by mouth daily at bedtime     SENNA (SENOKOT) 8.6 MG TABS TABLET    Take 2 tablets (17.2 mg total) by mouth daily.   THIAMINE 100 MG TABLET    Take 100 mg by mouth daily.   TORSEMIDE (DEMADEX) 10 MG TABLET    Take 10 mg by mouth daily.   TRAMADOL (ULTRAM) 50 MG TABLET    Take 1 tablet (50 mg total) by mouth 3 (three) times daily.  Modified Medications   No medications on file  Discontinued Medications   No medications on file     SIGNIFICANT DIAGNOSTIC EXAMS  Not a candidate for dexa; mammogram or colonoscopy    10-02-14: left leg doppler: +dvt  10-08-14: bilateral hip /pelvic fracture: right hip with marked osteoarthritis  04-23-15: right distal ulna and forearm: no acute fracture   10-28-15: left lower extremity doppler: negative for dvt  08-08-16: left lower extremity doppler: + DVT   LABS REVIEWED:    08-25-15: wbc 12.8; hgb 11.9; hct 36.5; mcv 82.4; plt 299; glucose 99; bun 20; creat 0.62; k+ 4.3;na++142; liver normal albumin 3.7; urine culture: e-coli: ceftin; guaiac stool: +  08-28-15: wbc 9.4; hgb 11.1; hct 34.0; mcv 84.0; plt 282; glucose 110; bun 11; creat 0.66; k+ 4.0; na++140  09-28-15; wbc 10.1; hgb 11.8; hct 39.0; mcv 83.6; plt 298; glucose 187; bun 16.1; creat 0.65; k+ 3.8; na++ 140; liver normal albumin 3.9; tsh 1.46; hgb a1c 6.9; chol 143; ldl 48; trig 151; hdl 64  4-0-98: hgb a1c 6.8  12-15-15: glucose 11; bun 16.7; creat 0.53; k+ 4.0; na+ 141  02-23-16: wbc 9.7; hgb 10.7; hct 34.9; mcv 84.0; plt 257; glucose 103; bun 12.7; creat 0.53; k+ 4.1; na++ 143 hgb a1c 7.4  07-27-16: hgb a1c 7.1; chol 153; ldl 66; trig 164; hdl 55     Review of Systems Limited secondary to dementia-but she denies any pain at this time shortness of breath chest pain nursing staff has not reported any recent acute issues appears to be doing fairly well with supportive care   Physical  Exam Constitutional: No distress. Sitting comfortably in her wheelchair  Frail   Neck: Neck supple. No JVD present. No thyromegaly present.  Cardiovascular: Normal rate, regular rhythm pedal pulses faint .   Respiratory: Effort normal and breath sounds normal. No respiratory distress.  GI: Soft. Bowel sounds are normal. She exhibits no distension. There is no tenderness.  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 Moves all extremities 4 grip strength appears to be intact moves all extremities her speech is clear but does not speak much  Skin: Skin is warm and dry. She is not diaphoretic.   psych she is oriented to self --tells me that her brother was a Programmer, multimedia in [employer] [place] appropriate did follow simple verbal commands without difficulty   ASSESSMENT/ PLAN:    1. Diabetes:  hgb a1c is 7.1  Urine for micro-albumin is 7.4;   Will continue metformin xr  1 gm daily --CBGs appear to be mainly in the lower 100s  2. Dysphagia: no signs of aspiration present; will not make changes in her current plan of care and will monitor her status. Appears to have some fluctuation in her weights although some of this may be scale variation-will order daily weights for 7 days to ensure stability here  3. 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   4. Psychosis; due to alcohol: she is presently without change in status; will continue zyprexa 15 mg daily; thiamine 100 mg daily;  will monitor  5. Dementia with behavioral disturbances due to  alcoholism: is without change in status; is presently not taking medications; will not make changes will monitor her status. Her current weight is 147 pounds.    6. DVT left leg:  (08-08-16; #2) will continue xarelto 20 mg nightly she will need this long term therapy. --- We'll discontinue the low-dose aspirin  7. Constipation: will miralax daily and senna 2 tabs daily   8. Bilateral lower extremity edema: will continue demadex 10  mg daily-also will update metabolic panel   9. Anemia: hgb 10.7; will monitor and update this  since Xarelto has  been restarted  #10  Insomnia-continues on melatonin daily at bedtime when necessary  MD is aware of resident's narcotic use and is in agreement with current plan of care. We will attempt to wean resident as apropriate   [phone]     .  Marland Kitchen

## 2016-09-20 LAB — BASIC METABOLIC PANEL
BUN: 14 mg/dL (ref 4–21)
CREATININE: 0.6 mg/dL (ref 0.5–1.1)
GLUCOSE: 106 mg/dL
POTASSIUM: 3.8 mmol/L (ref 3.4–5.3)
SODIUM: 140 mmol/L (ref 137–147)

## 2016-09-20 LAB — CBC AND DIFFERENTIAL
HEMATOCRIT: 37 % (ref 36–46)
Hemoglobin: 12.3 g/dL (ref 12.0–16.0)
Neutrophils Absolute: 87 /uL
Platelets: 339 10*3/uL (ref 150–399)
WBC: 14 10^3/mL

## 2016-09-20 LAB — HEPATIC FUNCTION PANEL
ALT: 18 U/L (ref 7–35)
AST: 27 U/L (ref 13–35)
Alkaline Phosphatase: 110 U/L (ref 25–125)
Bilirubin, Total: 0.2 mg/dL

## 2016-09-22 LAB — CBC AND DIFFERENTIAL
HEMATOCRIT: 35 % — AB (ref 36–46)
HEMOGLOBIN: 11.1 g/dL — AB (ref 12.0–16.0)
NEUTROS ABS: 8 /uL
PLATELETS: 282 10*3/uL (ref 150–399)
WBC: 12.1 10^3/mL

## 2016-10-19 ENCOUNTER — Encounter: Payer: Self-pay | Admitting: Adult Health

## 2016-10-19 ENCOUNTER — Non-Acute Institutional Stay (SKILLED_NURSING_FACILITY): Payer: Medicaid Other | Admitting: Adult Health

## 2016-10-19 DIAGNOSIS — F10959 Alcohol use, unspecified with alcohol-induced psychotic disorder, unspecified: Secondary | ICD-10-CM

## 2016-10-19 DIAGNOSIS — F1027 Alcohol dependence with alcohol-induced persisting dementia: Secondary | ICD-10-CM | POA: Insufficient documentation

## 2016-10-19 DIAGNOSIS — R1312 Dysphagia, oropharyngeal phase: Secondary | ICD-10-CM

## 2016-10-19 DIAGNOSIS — I825Y2 Chronic embolism and thrombosis of unspecified deep veins of left proximal lower extremity: Secondary | ICD-10-CM | POA: Diagnosis not present

## 2016-10-19 DIAGNOSIS — M170 Bilateral primary osteoarthritis of knee: Secondary | ICD-10-CM

## 2016-10-19 DIAGNOSIS — G8929 Other chronic pain: Secondary | ICD-10-CM | POA: Diagnosis not present

## 2016-10-19 NOTE — Progress Notes (Signed)
Location:   [facility] Room Number: 204 B Place of Service:  SNF (31)   CODE STATUS: Full Code  No Known Allergies  Chief Complaint  Patient presents with  . Medical Management of Chronic Issues    1 month follow up    HPI:  She is a long term resident of this facility being seen for the management of her chronic illnesses. Overall there is little change in her status. She does get out of bed daily to her wheelchair and is out and about. She is unable to participate in the hpi or ros; but does tell me that she is feeling good. There are no nursing concerns at this time.    Past Medical History:  Diagnosis Date  . Acute deep vein thrombosis (DVT) of left femoral vein (HCC) 02/22/2015  . Alcohol abuse   . Arthritis   . Dementia with behavioral problem   . Diabetes mellitus without complication (HCC)   . Dysphagia    and aspiration risk  . 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 Seizures (HCC)     History reviewed. No pertinent surgical history.  Social History   Social History  . Marital status: Unknown    Spouse name: N/A  . Number of children: N/A  . Years of education: N/A   Occupational History  . Not on file.   Social History Main Topics  . Smoking status: Current Every Day Smoker    Packs/day: 0.50    Years: 15.00    Types: Cigarettes  . Smokeless tobacco: Never Used  . Alcohol use 1.2 oz/week    2 Cans of beer per week     Comment: occ  . Drug use: No  . Sexual activity: Not Currently   Other Topics Concern  . Not on file   Social History Narrative   ** Merged History Encounter **       History reviewed. No pertinent family history.    VITAL SIGNS BP 126/72   Pulse 80   Temp 97.4 F (36.3 C)   Resp 18   Ht 5\' 2"  (1.575 m)   Wt 144 lb 9.6 oz (65.6 kg)   LMP 03/25/2015 (Approximate)   SpO2 96%   BMI 26.45 kg/m   Patient's Medications  New Prescriptions   No medications on file  Previous Medications   AMINO ACIDS-PROTEIN  HYDROLYS (FEEDING SUPPLEMENT, PRO-STAT SUGAR FREE 64,) LIQD    Take 30 mLs by mouth 2 (two) times daily. 0800 and 1700   CHOLECALCIFEROL (VITAMIN D) 1000 UNITS TABLET    Take 1,000 Units by mouth daily. Reported on 10/28/2015   DICLOFENAC SODIUM (VOLTAREN) 1 % GEL    Apply 2 g topically 4 (four) times daily. To both knees   GABAPENTIN (NEURONTIN) 300 MG CAPSULE    Take 3 capsules (900 mg total) by mouth 3 (three) times daily.   LORAZEPAM (ATIVAN) 0.5 MG TABLET    Take 0.5 mg by mouth every 8 (eight) hours as needed for anxiety.   MELATONIN 3 MG TABS    Take 3 mg by mouth at bedtime as needed (sleep).    METFORMIN (GLUCOPHAGE-XR) 500 MG 24 HR TABLET    Take 1,000 mg by mouth daily with breakfast.    MULTIPLE VITAMINS-MINERALS (DECUBI-VITE) CAPS    Take 2 capsules by mouth at bedtime.   OLANZAPINE (ZYPREXA) 15 MG TABLET    Take 15 mg by mouth at bedtime.    POLYETHYLENE GLYCOL (MIRALAX /  GLYCOLAX) PACKET    Take 17 g by mouth daily.   RIVAROXABAN (XARELTO) 15 MG TABS TABLET    Take 20 mg by mouth at bedtime. 20 mg one tablet by mouth daily at bedtime   SENNA (SENOKOT) 8.6 MG TABS TABLET    Take 2 tablets (17.2 mg total) by mouth daily.   THIAMINE 100 MG TABLET    Take 100 mg by mouth daily.   TORSEMIDE (DEMADEX) 10 MG TABLET    Take 10 mg by mouth daily.   TRAMADOL (ULTRAM) 50 MG TABLET    Take 1 tablet (50 mg total) by mouth 3 (three) times daily.  Modified Medications   No medications on file  Discontinued Medications   ASPIRIN 81 MG TABLET    Take 81 mg by mouth daily.     SIGNIFICANT DIAGNOSTIC EXAMS  Not a candidate for dexa; mammogram or colonoscopy    10-02-14: left leg doppler: +dvt  10-08-14: bilateral hip /pelvic fracture: right hip with marked osteoarthritis  04-23-15: right distal ulna and forearm: no acute fracture   10-28-15: left lower extremity doppler: negative for dvt  08-08-16: left lower extremity doppler: + DVT   LABS REVIEWED:    09-28-15; wbc 10.1; hgb 11.8; hct  39.0; mcv 83.6; plt 298; glucose 187; bun 16.1; creat 0.65; k+ 3.8; na++ 140; liver normal albumin 3.9; tsh 1.46; hgb a1c 6.9; chol 143; ldl 48; trig 151; hdl 64  06-03-08: hgb a1c 6.8  12-15-15: glucose 11; bun 16.7; creat 0.53; k+ 4.0; na+ 141  02-23-16: wbc 9.7; hgb 10.7; hct 34.9; mcv 84.0; plt 257; glucose 103; bun 12.7; creat 0.53; k+ 4.1; na++ 143 hgb a1c 7.4  07-27-16: hgb a1c 7.1; chol 153; ldl 66; trig 164; hdl 55  9-60-45: wbc 12.1; hgb 11.1; hct 34.8; mcv 86.6; plt 282; glucose 106; bun 14.0; creat 0.55; k+ 3.8; na++ 140; ca 10.1; liver normal albumin 4.4     Review of Systems Unable to perform ROS: dementia    Physical Exam Constitutional: No distress.  Frail   Neck: Neck supple. No JVD present. No thyromegaly present.  Cardiovascular: Normal rate, regular rhythm pedal pulses faint .   Respiratory: Effort normal and breath sounds normal. No respiratory distress.  GI: Soft. Bowel sounds are normal. She exhibits no distension. There is no tenderness.  Musculoskeletal: no edema  Is able to move all extremities  Neurological: She is alert.  Skin: Skin is warm and dry. She is not diaphoretic.     ASSESSMENT/ PLAN:   1. Diabetes:  hgb a1c is 7.1  Urine for micro-albumin is 7.4;   Will continue metformin xr  1 gm daily; is not on statin her ldl is 66; is not on ace/arb due to blood pressure of 126/72.     2. Dysphagia: no signs of aspiration present; will not make changes in her current plan of care and will monitor her status.   3. 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   4. Psychosis; due to alcohol: she is presently without change in status; will continue zyprexa 15 mg daily; thiamine 100 mg daily;  will monitor  5. 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 Her current weight is 144 pounds her weight in April 2017 was 150 pounds.    6. DVT left leg:  (08-08-16; #2) will continue xarelto 20 mg nightly she will need this long term therapy.   7. Constipation: will miralax daily and senna 2 tabs daily   8. Bilateral lower extremity edema: will continue demadex 10  mg daily   9. Anemia: hgb 10.7; will monitor   Will stop MVI and prostat  Urine micro-albumin   MD is aware of resident's narcotic use and is in agreement with current plan of care. We will attempt to wean resident as apropriate     Synthia Innocent NP [facility]  Contact [phone] Monday through Friday 8am- 5pm  After hours call [phone]

## 2016-10-31 IMAGING — US DG FLUORO GUIDE CV LINE
1 series · 1 of 1 positions shown · non-contrast
Comparison: none

CLINICAL DATA: pt has wound infection, needs PICC placed; Wound
infection, initial encounter

EXAM:
PICC PLACEMENT WITH ULTRASOUND AND FLUOROSCOPY
FLUOROSCOPY TIME:  0.2 minutes
TECHNIQUE: After written informed consent was obtained, patient was placed in
the supine position on angiographic table. Patency of the right
brachial vein was confirmed with ultrasound with image
documentation. An appropriate skin site was determined. Skin site
was marked. Region was prepped using maximum barrier technique
including cap and mask, sterile gown, sterile gloves, large sterile
sheet, and Chlorhexidine as cutaneous antisepsis. The region was
infiltrated locally with 1% lidocaine. Under real-time ultrasound
guidance, the right brachial vein was accessed with a 21 gauge
micropuncture needle; the needle tip within the vein was confirmed
with ultrasound image documentation. Needle exchanged over a 018
guidewire for a peel-away sheath, through which a 5-French
Single-lumen power injectable PICC trimmed to 38cm was advanced,
positioned with its tip near the cavoatrial junction. Spot chest
radiograph confirms appropriate catheter position. Catheter was
flushed per protocol and secured externally. The patient tolerated
procedure well.
COMPLICATIONS:
COMPLICATIONS
none

[Series 1: dg fluoro guide cv line · 1 of 1 slices shown]
[im 1/1]
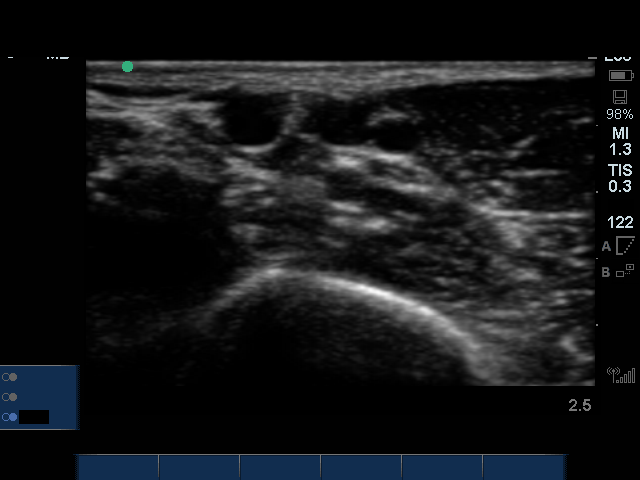

[1 of 1 positions shown; findings below may reference images not displayed]

IMPRESSION: 1. Technically successful five French Single lumen power injectable
PICC placement

## 2016-11-22 ENCOUNTER — Non-Acute Institutional Stay (SKILLED_NURSING_FACILITY): Payer: Medicaid Other | Admitting: Adult Health

## 2016-11-22 ENCOUNTER — Encounter: Payer: Self-pay | Admitting: Adult Health

## 2016-11-22 DIAGNOSIS — E1142 Type 2 diabetes mellitus with diabetic polyneuropathy: Secondary | ICD-10-CM | POA: Diagnosis not present

## 2016-11-22 DIAGNOSIS — M170 Bilateral primary osteoarthritis of knee: Secondary | ICD-10-CM

## 2016-11-22 DIAGNOSIS — F419 Anxiety disorder, unspecified: Secondary | ICD-10-CM | POA: Diagnosis not present

## 2016-11-22 DIAGNOSIS — F10959 Alcohol use, unspecified with alcohol-induced psychotic disorder, unspecified: Secondary | ICD-10-CM | POA: Diagnosis not present

## 2016-11-22 DIAGNOSIS — F1027 Alcohol dependence with alcohol-induced persisting dementia: Secondary | ICD-10-CM

## 2016-11-22 DIAGNOSIS — R1312 Dysphagia, oropharyngeal phase: Secondary | ICD-10-CM

## 2016-11-22 DIAGNOSIS — I825Y2 Chronic embolism and thrombosis of unspecified deep veins of left proximal lower extremity: Secondary | ICD-10-CM

## 2016-11-22 NOTE — Progress Notes (Signed)
Location:   [facility] Room Number: 204 B Place of Service:  SNF (31)   CODE STATUS: Full Code  No Known Allergies  Chief Complaint  Patient presents with  . Medical Management of Chronic Issues    1 month follow up    HPI:  She is a 61 year old long term resident of this facility being seen for the management of her chronic illnesses. Overall there is little change in her status. She tells me that she is feeling good. There are no nursing concerns at this time.   Past Medical History:  Diagnosis Date  . Acute deep vein thrombosis (DVT) of left femoral vein (HCC) 02/22/2015  . Alcohol abuse   . Arthritis   . Dementia with behavioral problem   . Diabetes mellitus without complication (HCC)   . Dysphagia    and aspiration risk  . 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 Seizures (HCC)     History reviewed. No pertinent surgical history.  Social History   Social History  . Marital status: Unknown    Spouse name: N/A  . Number of children: N/A  . Years of education: N/A   Occupational History  . Not on file.   Social History Main Topics  . Smoking status: Current Every Day Smoker    Packs/day: 0.50    Years: 15.00    Types: Cigarettes  . Smokeless tobacco: Never Used  . Alcohol use 1.2 oz/week    2 Cans of beer per week     Comment: occ  . Drug use: No  . Sexual activity: Not Currently   Other Topics Concern  . Not on file   Social History Narrative   ** Merged History Encounter **       History reviewed. No pertinent family history.    VITAL SIGNS BP 116/78   Pulse 86   Temp 98 F (36.7 C)   Resp 18   Ht 5\' 2"  (1.575 m)   Wt 146 lb 11.2 oz (66.5 kg)   LMP 03/25/2015 (Approximate)   SpO2 98%   BMI 26.83 kg/m   Patient's Medications  New Prescriptions   No medications on file  Previous Medications   CHOLECALCIFEROL (VITAMIN D) 1000 UNITS TABLET    Take 1,000 Units by mouth daily. Reported on 10/28/2015   DICLOFENAC SODIUM  (VOLTAREN) 1 % GEL    Apply 2 g topically 4 (four) times daily. To both knees   GABAPENTIN (NEURONTIN) 300 MG CAPSULE    Take 3 capsules (900 mg total) by mouth 3 (three) times daily.   LORAZEPAM (ATIVAN) 0.5 MG TABLET    Take 0.5 mg by mouth every 8 (eight) hours as needed for anxiety.   MELATONIN 3 MG TABS    Take 3 mg by mouth at bedtime as needed (sleep).    METFORMIN (GLUCOPHAGE-XR) 500 MG 24 HR TABLET    Take 1,000 mg by mouth daily with breakfast.    MULTIPLE VITAMINS-MINERALS (DECUBI-VITE) CAPS    Take 2 capsules by mouth at bedtime.   OLANZAPINE (ZYPREXA) 15 MG TABLET    Take 15 mg by mouth at bedtime.    PHENYLEPHRINE-SHARK LIVER OIL-MINERAL OIL-PETROLATUM (PREPARATION H) 0.25-14-74.9 % RECTAL OINTMENT    Place 1 application rectally every 8 (eight) hours as needed for hemorrhoids.   POLYETHYLENE GLYCOL (MIRALAX / GLYCOLAX) PACKET    Take 17 g by mouth daily.   RIVAROXABAN (XARELTO) 15 MG TABS TABLET    Take  20 mg by mouth at bedtime. 20 mg one tablet by mouth daily at bedtime   SENNA (SENOKOT) 8.6 MG TABS TABLET    Take 2 tablets (17.2 mg total) by mouth daily.   THIAMINE 100 MG TABLET    Take 100 mg by mouth daily.   TORSEMIDE (DEMADEX) 10 MG TABLET    Take 10 mg by mouth daily.   TRAMADOL (ULTRAM) 50 MG TABLET    Take 1 tablet (50 mg total) by mouth 3 (three) times daily.  Modified Medications   No medications on file  Discontinued Medications   AMINO ACIDS-PROTEIN HYDROLYS (FEEDING SUPPLEMENT, PRO-STAT SUGAR FREE 64,) LIQD    Take 30 mLs by mouth 2 (two) times daily. 0800 and 1700     SIGNIFICANT DIAGNOSTIC EXAMS  10-02-14: left leg doppler: +dvt  10-08-14: bilateral hip /pelvic fracture: right hip with marked osteoarthritis  04-23-15: right distal ulna and forearm: no acute fracture   10-28-15: left lower extremity doppler: negative for dvt  08-08-16: left lower extremity doppler: + DVT   LABS REVIEWED:     12-15-15: glucose 11; bun 16.7; creat 0.53; k+ 4.0; na+ 141    02-23-16: wbc 9.7; hgb 10.7; hct 34.9; mcv 84.0; plt 257; glucose 103; bun 12.7; creat 0.53; k+ 4.1; na++ 143 hgb a1c 7.4  07-27-16: hgb a1c 7.1; chol 153; ldl 66; trig 164; hdl 55  7-82-95: wbc 12.1; hgb 11.1; hct 34.8; mcv 86.6; plt 282; glucose 106; bun 14.0; creat 0.55; k+ 3.8; na++ 140; ca 10.1; liver normal albumin 4.4     Review of Systems Unable to perform ROS: dementia    Physical Exam Constitutional: No distress.  Frail   Neck: Neck supple. No JVD present. No thyromegaly present.  Cardiovascular: Normal rate, regular rhythm pedal pulses faint .   Respiratory: Effort normal and breath sounds normal. No respiratory distress.  GI: Soft. Bowel sounds are normal. She exhibits no distension. There is no tenderness.  Musculoskeletal: no edema  Is able to move all extremities  Neurological: She is alert.  Skin: Skin is warm and dry. She is not diaphoretic.     ASSESSMENT/ PLAN:   1. Diabetes:  hgb a1c is 7.1  Urine for micro-albumin is 7.4;   Will continue metformin xr  1 gm daily; is not on statin her ldl is 66; is not on ace/arb due to blood pressure of 116/78.     2. Dysphagia: no signs of aspiration present; will not make changes in her current plan of care and will monitor her status.   3. 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   4. Psychosis; due to alcohol: she is presently without change in status; will continue zyprexa 15 mg daily; thiamine 100 mg daily; she has ativan 0.5 mg every 8 hours as needed for her episodic anxiety; she does not need this on a routine basis; but does benefit from having an occasional dose to manage her anxiety episodes.  will monitor  5. 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 Her current weight is 146 pounds her weight in April 2017 was 150 pounds.    6.  DVT left leg:  (08-08-16; #2) will continue xarelto 20 mg nightly she will need this long term therapy.   7. Constipation: will miralax daily and senna 2 tabs daily   8. Bilateral lower extremity edema: will continue demadex 10  mg daily   9. Anemia: hgb 10.7; will monitor   MD is aware of resident's narcotic use and is in agreement with current plan of care. We will attempt to wean resident as apropriate     Synthia Innocent NP [facility]  Contact [phone] Monday through Friday 8am- 5pm  After hours call [phone]

## 2016-12-18 ENCOUNTER — Non-Acute Institutional Stay (SKILLED_NURSING_FACILITY): Payer: Medicaid Other | Admitting: Adult Health

## 2016-12-18 ENCOUNTER — Encounter: Payer: Self-pay | Admitting: Adult Health

## 2016-12-18 DIAGNOSIS — M170 Bilateral primary osteoarthritis of knee: Secondary | ICD-10-CM | POA: Diagnosis not present

## 2016-12-18 DIAGNOSIS — E1142 Type 2 diabetes mellitus with diabetic polyneuropathy: Secondary | ICD-10-CM | POA: Diagnosis not present

## 2016-12-18 DIAGNOSIS — F1027 Alcohol dependence with alcohol-induced persisting dementia: Secondary | ICD-10-CM

## 2016-12-18 DIAGNOSIS — F10959 Alcohol use, unspecified with alcohol-induced psychotic disorder, unspecified: Secondary | ICD-10-CM | POA: Diagnosis not present

## 2016-12-18 DIAGNOSIS — G8929 Other chronic pain: Secondary | ICD-10-CM

## 2016-12-18 NOTE — Progress Notes (Signed)
Location:   [facility] Room Number: 204 B Place of Service:  SNF (31)   CODE STATUS: Full Code  No Known Allergies  Chief Complaint  Patient presents with  . Medical Management of Chronic Issues    1 month follow up    HPI:  She is a 61 year old long term resident of this facility being seen for the management of her chronic illnesses: diabetes; chronic pain due to osteoarthritis; psychosis; and dementia.  Overall there is little change in her status. She continues to get out of bed on a daily basis. She does engage with others around her. She is unable to fully participate in the hpi or ros; but told me that she is feeling good. There are no nursing concerns at this time.    Past Medical History:  Diagnosis Date  . Acute deep vein thrombosis (DVT) of left femoral vein (HCC) 02/22/2015  . Alcohol abuse   . Arthritis   . Dementia with behavioral problem   . Diabetes mellitus without complication (HCC)   . Dysphagia    and aspiration risk  . 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 Seizures (HCC)     History reviewed. No pertinent surgical history.  Social History   Social History  . Marital status: Unknown    Spouse name: N/A  . Number of children: N/A  . Years of education: N/A   Occupational History  . Not on file.   Social History Main Topics  . Smoking status: Current Every Day Smoker    Packs/day: 0.50    Years: 15.00    Types: Cigarettes  . Smokeless tobacco: Never Used  . Alcohol use 1.2 oz/week    2 Cans of beer per week     Comment: occ  . Drug use: No  . Sexual activity: Not Currently   Other Topics Concern  . Not on file   Social History Narrative   ** Merged History Encounter **       History reviewed. No pertinent family history.    VITAL SIGNS BP 120/68   Pulse 72   Temp 98.4 F (36.9 C)   Resp 18   Ht 5\' 6"  (1.676 m) Comment: lying down  Wt 154 lb 12.8 oz (70.2 kg)   LMP 03/25/2015 (Approximate)   SpO2 98%   BMI 24.99  kg/m   Patient's Medications  New Prescriptions   No medications on file  Previous Medications   CHOLECALCIFEROL (VITAMIN D) 1000 UNITS TABLET    Take 1,000 Units by mouth daily. Reported on 10/28/2015   DICLOFENAC SODIUM (VOLTAREN) 1 % GEL    Apply 2 g topically 4 (four) times daily. To both knees   GABAPENTIN (NEURONTIN) 300 MG CAPSULE    Take 3 capsules (900 mg total) by mouth 3 (three) times daily.   LORAZEPAM (ATIVAN) 0.5 MG TABLET    Take 0.5 mg by mouth every 8 (eight) hours as needed for anxiety.   MELATONIN 3 MG TABS    Take 3 mg by mouth at bedtime as needed (sleep).    METFORMIN (GLUCOPHAGE-XR) 500 MG 24 HR TABLET    Take 1,000 mg by mouth daily with breakfast.    MULTIPLE VITAMINS-MINERALS (DECUBI-VITE) CAPS    Take 2 capsules by mouth at bedtime.   NUTRITIONAL SUPPLEMENTS (NUTRITIONAL SUPPLEMENT PO)    House Supplement - Give nutritional treat by mouth with meals daily for caloric and weight support   OLANZAPINE (ZYPREXA) 15 MG  TABLET    Take 15 mg by mouth at bedtime.    PHENYLEPHRINE-SHARK LIVER OIL-MINERAL OIL-PETROLATUM (PREPARATION H) 0.25-14-74.9 % RECTAL OINTMENT    Place 1 application rectally every 8 (eight) hours as needed for hemorrhoids.   POLYETHYLENE GLYCOL (MIRALAX / GLYCOLAX) PACKET    Take 17 g by mouth daily.   RIVAROXABAN (XARELTO) 15 MG TABS TABLET    Take 20 mg by mouth at bedtime. 20 mg one tablet by mouth daily at bedtime   SENNA (SENOKOT) 8.6 MG TABS TABLET    Take 2 tablets (17.2 mg total) by mouth daily.   THIAMINE 100 MG TABLET    Take 100 mg by mouth daily.   TORSEMIDE (DEMADEX) 10 MG TABLET    Take 10 mg by mouth daily.   TRAMADOL (ULTRAM) 50 MG TABLET    Take 1 tablet (50 mg total) by mouth 3 (three) times daily.  Modified Medications   No medications on file  Discontinued Medications   No medications on file     SIGNIFICANT DIAGNOSTIC EXAMS  PREVIOUS  10-02-14: left leg doppler: +dvt  10-08-14: bilateral hip /pelvic fracture: right hip with  marked osteoarthritis  04-23-15: right distal ulna and forearm: no acute fracture   10-28-15: left lower extremity doppler: negative for dvt  08-08-16: left lower extremity doppler: + DVT  NO NEW EXAMS    LABS REVIEWED: PREVIOUS    12-15-15: glucose 11; bun 16.7; creat 0.53; k+ 4.0; na+ 141  02-23-16: wbc 9.7; hgb 10.7; hct 34.9; mcv 84.0; plt 257; glucose 103; bun 12.7; creat 0.53; k+ 4.1; na++ 143 hgb a1c 7.4  07-27-16: hgb a1c 7.1; chol 153; ldl 66; trig 164; hdl 55  1-61-094-27-18: wbc 12.1; hgb 11.1; hct 34.8; mcv 86.6; plt 282; glucose 106; bun 14.0; creat 0.55; k+ 3.8; na++ 140; ca 10.1; liver normal albumin 4.4   NO NEW LABS    Review of Systems  Unable to perform ROS: Dementia (confused )       Physical Exam  Constitutional: No distress.  Frail   Eyes: Conjunctivae are normal.  Neck: Neck supple. No JVD present. No thyromegaly present.  Cardiovascular: Normal rate, regular rhythm and intact distal pulses.   Pedal pulses faint   Respiratory: Effort normal and breath sounds normal. No respiratory distress. She has no wheezes.  GI: Soft. Bowel sounds are normal. She exhibits no distension. There is no tenderness.  Musculoskeletal: She exhibits no edema.  Able to move all extremities   Lymphadenopathy:    She has no cervical adenopathy.  Neurological: She is alert.  Skin: Skin is warm and dry. She is not diaphoretic.  Psychiatric: She has a normal mood and affect.    ASSESSMENT/ PLAN:  TODAY   1. Diabetes: stable hgb a1c is 7.1  Urine for micro-albumin is 7.4;   Will continue metformin xr  1 gm daily; is not on statin her ldl is 66; is not on ace/arb due to blood pressure of 116/78.     2. 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    3.  Psychosis; due to alcohol: she is presently without change in status; will continue zyprexa 15 mg daily; thiamine 100 mg daily; she has ativan 0.5 mg every 8  hours as needed for her episodic anxiety; she does not need this on a routine basis; but does benefit from having an occasional dose to manage her anxiety episodes.  will monitor  4. 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 Her current weight is 154 pounds her weight in April 2017 was 150 pounds.    PREVIOUS  5. DVT left leg:  (08-08-16; #2) will continue xarelto 20 mg nightly she is on long term therapy    6. Constipation: will miralax daily and senna 2 tabs daily   7. Bilateral lower extremity edema: will continue demadex 10  mg daily   8. Anemia: hgb 10.7; will monitor    MD is aware of resident's narcotic use and is in agreement with current plan of care. We will attempt to wean resident as apropriate     Synthia Innocent NP [facility]  Contact [phone] Monday through Friday 8am- 5pm  After hours call [phone]

## 2017-01-04 LAB — BASIC METABOLIC PANEL
BUN: 14 (ref 4–21)
Creatinine: 0.6 (ref 0.5–1.1)
GLUCOSE: 120
Potassium: 3.7 (ref 3.4–5.3)
Sodium: 140 (ref 137–147)

## 2017-01-04 LAB — HEPATIC FUNCTION PANEL
ALT: 13 (ref 7–35)
AST: 14 (ref 13–35)
Alkaline Phosphatase: 103 (ref 25–125)
BILIRUBIN, TOTAL: 0.2

## 2017-01-04 LAB — CBC AND DIFFERENTIAL
HCT: 37 (ref 36–46)
Hemoglobin: 12.3 (ref 12.0–16.0)
NEUTROS ABS: 7
PLATELETS: 227 (ref 150–399)
WBC: 10.7

## 2017-01-05 ENCOUNTER — Encounter: Payer: Self-pay | Admitting: Adult Health

## 2017-01-05 NOTE — Progress Notes (Signed)
Entered in error

## 2017-01-09 LAB — HM DIABETES FOOT EXAM

## 2017-01-10 ENCOUNTER — Non-Acute Institutional Stay (SKILLED_NURSING_FACILITY): Payer: Medicaid Other | Admitting: Adult Health

## 2017-01-10 ENCOUNTER — Encounter: Payer: Self-pay | Admitting: Adult Health

## 2017-01-10 DIAGNOSIS — R600 Localized edema: Secondary | ICD-10-CM

## 2017-01-10 DIAGNOSIS — I825Y2 Chronic embolism and thrombosis of unspecified deep veins of left proximal lower extremity: Secondary | ICD-10-CM

## 2017-01-10 NOTE — Progress Notes (Signed)
Location:   [facility] Room Number: 204 B Place of Service:  SNF (31)   CODE STATUS: Full Code  No Known Allergies  Chief Complaint  Patient presents with  . Acute Visit    Edema    HPI:  The nursing has noted that for the past 4 days she has developed increased edema in her legs and stomach. There are no reports of low 02 sats; no reports of fever.  She is unable to fully participate in the hpi or ros but said that she feels bad; is unable to describe how.  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   Past Medical History:  Diagnosis Date  . Acute deep vein thrombosis (DVT) of left femoral vein (HCC) 02/22/2015  . Alcohol abuse   . Arthritis   . Dementia with behavioral problem   . Diabetes mellitus without complication (HCC)   . Dysphagia    and aspiration risk  . 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 Seizures (HCC)     History reviewed. No pertinent surgical history.  Social History   Social History  . Marital status: Unknown    Spouse name: N/A  . Number of children: N/A  . Years of education: N/A   Occupational History  . Not on file.   Social History Main Topics  . Smoking status: Current Every Day Smoker    Packs/day: 0.50    Years: 15.00    Types: Cigarettes  . Smokeless tobacco: Never Used  . Alcohol use 1.2 oz/week    2 Cans of beer per week     Comment: occ  . Drug use: No  . Sexual activity: Not Currently   Other Topics Concern  . Not on file   Social History Narrative   ** Merged History Encounter **       History reviewed. No pertinent family history.    VITAL SIGNS BP 134/79   Pulse 82   Temp 98.4 F (36.9 C)   Resp 18   Ht 5\' 6"  (1.676 m)   Wt 154 lb 6.4 oz (70 kg)   LMP 03/25/2015 (Approximate)   SpO2 98%   BMI 24.92 kg/m   Patient's Medications  New Prescriptions   No medications on file  Previous Medications   AMINO ACIDS-PROTEIN HYDROLYS (FEEDING SUPPLEMENT,  PRO-STAT SUGAR FREE 64,) LIQD    Take 30 mLs by mouth 2 (two) times daily.   CHOLECALCIFEROL (VITAMIN D) 1000 UNITS TABLET    Take 1,000 Units by mouth daily. Reported on 10/28/2015   DICLOFENAC SODIUM (VOLTAREN) 1 % GEL    Apply 2 g topically 4 (four) times daily. To both knees   GABAPENTIN (NEURONTIN) 300 MG CAPSULE    Take 3 capsules (900 mg total) by mouth 3 (three) times daily.   MELATONIN 3 MG TABS    Take 3 mg by mouth at bedtime as needed (sleep).    METFORMIN (GLUCOPHAGE-XR) 500 MG 24 HR TABLET    Take 1,000 mg by mouth daily with breakfast.    MULTIPLE VITAMINS-MINERALS (DECUBI-VITE) CAPS    Take 2 capsules by mouth at bedtime.   NUTRITIONAL SUPPLEMENTS (NUTRITIONAL SUPPLEMENT PO)    House Supplement - Give nutritional treat by mouth with meals daily for caloric and weight support   OLANZAPINE (ZYPREXA) 15 MG TABLET    Take 15 mg by mouth at bedtime.    POLYETHYLENE GLYCOL (MIRALAX /  GLYCOLAX) PACKET    Take 17 g by mouth daily.   RIVAROXABAN (XARELTO) 20 MG TABS TABLET    Take 20 mg by mouth at bedtime. 20 mg one tablet by mouth daily at bedtime   SENNA (SENOKOT) 8.6 MG TABS TABLET    Take 2 tablets (17.2 mg total) by mouth daily.   THIAMINE 100 MG TABLET    Take 100 mg by mouth daily.   TORSEMIDE (DEMADEX) 10 MG TABLET    Take 10 mg by mouth daily.   TRAMADOL (ULTRAM) 50 MG TABLET    Take 1 tablet (50 mg total) by mouth 3 (three) times daily.  Modified Medications   No medications on file  Discontinued Medications   LORAZEPAM (ATIVAN) 0.5 MG TABLET    Take 0.5 mg by mouth every 8 (eight) hours as needed for anxiety.   PHENYLEPHRINE-SHARK LIVER OIL-MINERAL OIL-PETROLATUM (PREPARATION H) 0.25-14-74.9 % RECTAL OINTMENT    Place 1 application rectally every 8 (eight) hours as needed for hemorrhoids.     SIGNIFICANT DIAGNOSTIC EXAMS  PREVIOUS  10-02-14: left leg doppler: +dvt  10-08-14: bilateral hip /pelvic fracture: right hip with marked osteoarthritis  04-23-15: right distal ulna  and forearm: no acute fracture   10-28-15: left lower extremity doppler: negative for dvt  08-08-16: left lower extremity doppler: + DVT  NO NEW EXAMS    LABS REVIEWED: PREVIOUS    12-15-15: glucose 11; bun 16.7; creat 0.53; k+ 4.0; na+ 141  02-23-16: wbc 9.7; hgb 10.7; hct 34.9; mcv 84.0; plt 257; glucose 103; bun 12.7; creat 0.53; k+ 4.1; na++ 143 hgb a1c 7.4  07-27-16: hgb a1c 7.1; chol 153; ldl 66; trig 164; hdl 55  01-25-55: wbc 12.1; hgb 11.1; hct 34.8; mcv 86.6; plt 282; glucose 106; bun 14.0; creat 0.55; k+ 3.8; na++ 140; ca 10.1; liver normal albumin 4.4   TODAY:  01-04-17: wbc 10.7; hgb 12.3; hct 37.3; mcv 83.6; plt 227; glucose 120; bun 13.6; creat 0.58; k+ 3.7; na++ 140; ca 9.4    Review of Systems  Unable to perform ROS: Dementia (confused unable to asnwer questions )   Physical Exam  Constitutional: No distress.  Frail   Eyes: Conjunctivae are normal.  Neck: Neck supple. No JVD present. No thyromegaly present.  Cardiovascular: Normal rate and regular rhythm.   Pedal pulses faint   Respiratory: Effort normal and breath sounds normal. No respiratory distress. She has no wheezes.  GI: Soft. Bowel sounds are normal. She exhibits no distension. There is no tenderness.  Musculoskeletal: She exhibits edema.  Able to move all extremities  Leans to left in wheelchair Has 3+ lower extremity edema bilaterally Has generalized edema   Lymphadenopathy:    She has no cervical adenopathy.  Neurological: She is alert.  Skin: Skin is warm and dry. She is not diaphoretic.  Psychiatric: She has a normal mood and affect.      ASSESSMENT/ PLAN:  TODAY   1. DVT left leg:  (08-08-16; #2) stable  will continue xarelto 20 mg nightly she is on long term therapy there is no pain to palpation in her calf and thigh will monitor this at this time.      2. Bilateral lower extremity edema: is worse; will increase dementia to 20 mg twice daily with k+ 20 meq daily   Will get cxr and BNP in the  am.      MD is aware of resident's narcotic use and is in agreement with current plan of care.  We will attempt to wean resident as apropriate     Ok Edwards NP [facility]  Contact [phone] Monday through Friday 8am- 5pm  After hours call [phone]

## 2017-01-12 ENCOUNTER — Non-Acute Institutional Stay (SKILLED_NURSING_FACILITY): Payer: Medicaid Other | Admitting: Adult Health

## 2017-01-12 ENCOUNTER — Encounter: Payer: Self-pay | Admitting: Adult Health

## 2017-01-12 DIAGNOSIS — R600 Localized edema: Secondary | ICD-10-CM

## 2017-01-12 DIAGNOSIS — K5901 Slow transit constipation: Secondary | ICD-10-CM | POA: Diagnosis not present

## 2017-01-12 DIAGNOSIS — D649 Anemia, unspecified: Secondary | ICD-10-CM | POA: Diagnosis not present

## 2017-01-12 DIAGNOSIS — I825Y2 Chronic embolism and thrombosis of unspecified deep veins of left proximal lower extremity: Secondary | ICD-10-CM | POA: Diagnosis not present

## 2017-01-12 NOTE — Progress Notes (Signed)
Location:   [facility] Room Number: 204 B Place of Service:  SNF (31)   CODE STATUS: Full Code  No Known Allergies  Chief Complaint  Patient presents with  . Medical Management of Chronic Issues    chronic left leg dvt; constipation; bilateral lower extremity edema and anemia     HPI:  She is a 61 year old long term resident of this facility being seen for the management of her chronic illnesses: chronic left leg dvt; constipation; bilateral lower extremity edema.  She has recently had her Demadex increased to 20 mg twice daily. She does have some lessening of her edema. Her BNP is slightly elevated at 339.10. She has gained 10 pounds since May of this year. She is unable to fully participate in the hpi or ros. Today the nursing staff says she is doing well.     Past Medical History:  Diagnosis Date  . Acute deep vein thrombosis (DVT) of left femoral vein (HCC) 02/22/2015  . Alcohol abuse   . Arthritis   . Dementia with behavioral problem   . Diabetes mellitus without complication (HCC)   . Dysphagia    and aspiration risk  . 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 Seizures (HCC)     History reviewed. No pertinent surgical history.  Social History   Social History  . Marital status: Unknown    Spouse name: N/A  . Number of children: N/A  . Years of education: N/A   Occupational History  . Not on file.   Social History Main Topics  . Smoking status: Current Every Day Smoker    Packs/day: 0.50    Years: 15.00    Types: Cigarettes  . Smokeless tobacco: Never Used  . Alcohol use 1.2 oz/week    2 Cans of beer per week     Comment: occ  . Drug use: No  . Sexual activity: Not Currently   Other Topics Concern  . Not on file   Social History Narrative   ** Merged History Encounter **       History reviewed. No pertinent family history.    VITAL SIGNS BP 134/79   Pulse 82   Temp 98.4 F (36.9 C)   Resp 18   Ht 5\' 6"  (1.676 m)   Wt 154 lb 6.4  oz (70 kg)   LMP 03/25/2015 (Approximate)   SpO2 98%   BMI 24.92 kg/m   Patient's Medications  New Prescriptions   No medications on file  Previous Medications   AMINO ACIDS-PROTEIN HYDROLYS (FEEDING SUPPLEMENT, PRO-STAT SUGAR FREE 64,) LIQD    Take 30 mLs by mouth 2 (two) times daily.   CHOLECALCIFEROL (VITAMIN D) 1000 UNITS TABLET    Take 1,000 Units by mouth daily. Reported on 10/28/2015   DICLOFENAC SODIUM (VOLTAREN) 1 % GEL    Apply 2 g topically 4 (four) times daily. To both knees   GABAPENTIN (NEURONTIN) 300 MG CAPSULE    Take 3 capsules (900 mg total) by mouth 3 (three) times daily.   MELATONIN 3 MG TABS    Take 3 mg by mouth at bedtime as needed (sleep).    METFORMIN (GLUCOPHAGE-XR) 500 MG 24 HR TABLET    Take 1,000 mg by mouth daily with breakfast.    MULTIPLE VITAMINS-MINERALS (DECUBI-VITE) CAPS    Take 2 capsules by mouth at bedtime.   NUTRITIONAL SUPPLEMENTS (NUTRITIONAL SUPPLEMENT PO)    House Supplement - Give nutritional treat by mouth with meals  daily for caloric and weight support   OLANZAPINE (ZYPREXA) 15 MG TABLET    Take 15 mg by mouth at bedtime.    POLYETHYLENE GLYCOL (MIRALAX / GLYCOLAX) PACKET    Take 17 g by mouth daily.   POTASSIUM CHLORIDE SA (K-DUR,KLOR-CON) 20 MEQ TABLET    Take 20 mEq by mouth daily.   RIVAROXABAN (XARELTO) 20 MG TABS TABLET    Take 20 mg by mouth at bedtime. 20 mg one tablet by mouth daily at bedtime   SENNA (SENOKOT) 8.6 MG TABS TABLET    Take 2 tablets (17.2 mg total) by mouth daily.   THIAMINE 100 MG TABLET    Take 100 mg by mouth daily.   TORSEMIDE (DEMADEX) 20 MG TABLET    Take 20 mg by mouth 2 (two) times daily.   Modified Medications   Modified Medication Previous Medication   TRAMADOL (ULTRAM) 50 MG TABLET traMADol (ULTRAM) 50 MG tablet      Take 1 tablet (50 mg total) by mouth 3 (three) times daily.    Take 1 tablet (50 mg total) by mouth 3 (three) times daily.  Discontinued Medications   No medications on file     SIGNIFICANT  DIAGNOSTIC EXAMS  PREVIOUS  10-02-14: left leg doppler: +dvt  10-08-14: bilateral hip /pelvic fracture: right hip with marked osteoarthritis  04-23-15: right distal ulna and forearm: no acute fracture   10-28-15: left lower extremity doppler: negative for dvt  08-08-16: left lower extremity doppler: + DVT  TODAY:  01-10-17: chest x-ray: no discernable pneumonia of congestive heart failure    LABS REVIEWED: PREVIOUS    12-15-15: glucose 11; bun 16.7; creat 0.53; k+ 4.0; na+ 141  02-23-16: wbc 9.7; hgb 10.7; hct 34.9; mcv 84.0; plt 257; glucose 103; bun 12.7; creat 0.53; k+ 4.1; na++ 143 hgb a1c 7.4  07-27-16: hgb a1c 7.1; chol 153; ldl 66; trig 164; hdl 55  8-36-62: wbc 12.1; hgb 11.1; hct 34.8; mcv 86.6; plt 282; glucose 106; bun 14.0; creat 0.55; k+ 3.8; na++ 140; ca 10.1; liver normal albumin 4.4  01-04-17: wbc 10.7; hgb 12.3; hct 37.3; mcv 83.6; plt 227; glucose 120; bun 13.6; creat 0.58; k+ 3.7; na++ 140; ca 9.4   TODAY:  01-11-17: BNP 339.10   Review of Systems  Unable to perform ROS: Dementia (confused and unable to answer questions )    Physical Exam  Constitutional: No distress.  Frail   Eyes: Conjunctivae are normal.  Neck: Neck supple. No JVD present. No thyromegaly present.  Cardiovascular: Normal rate and regular rhythm.   Pedal pulses faint  Respiratory: Effort normal and breath sounds normal. No respiratory distress. She has no wheezes.  GI: Soft. Bowel sounds are normal. She exhibits no distension. There is no tenderness.  Musculoskeletal: She exhibits edema.  Able to move all extremities  Leans to left in wheelchair Has 2-3+ lower extremity edema bilaterally Has generalized edema     Lymphadenopathy:    She has no cervical adenopathy.  Neurological: She is alert.  Skin: Skin is warm and dry. She is not diaphoretic.  Psychiatric: She has a normal mood and affect.    ASSESSMENT/ PLAN:  TODAY   1. DVT left leg:  (08-08-16; #2) stable  will continue xarelto 20  mg nightly she is on long term therapy there is no pain to palpation in her calf and thigh will monitor this at this time.      2. Bilateral lower extremity edema: no change in  status:  will continue dementia  20 mg twice daily with k+ 20 meq daily she is slowly gaining weight. Will get 2-d echo  3. Constipation: stable will miralax daily and senna 2 tabs daily   4. Anemia: hgb 12.3; stable  will monitor    PREVIOUS  5. Diabetes: stable hgb a1c is 7.1  Urine for micro-albumin is 7.4;   Will continue metformin xr  1 gm daily; is not on statin her ldl is 66; is not on ace/arb due to blood pressure of 116/78.     6. 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    7. Psychosis; due to alcohol: she is presently without change in status; will continue zyprexa 15 mg daily; thiamine 100 mg daily; she has ativan 0.5 mg every 8 hours as needed for her episodic anxiety; she does not need this on a routine basis; but does benefit from having an occasional dose to manage her anxiety episodes.  will monitor  8. 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 Her current weight is 154 pounds her weight in April 2017 was 150 pounds.      MD is aware of resident's narcotic use and is in agreement with current plan of care. We will attempt to wean resident as apropriate   Synthia Innocent NP [facility]  Contact [phone] Monday through Friday 8am- 5pm  After hours call [phone]

## 2017-01-18 ENCOUNTER — Other Ambulatory Visit: Payer: Self-pay

## 2017-01-18 DIAGNOSIS — M170 Bilateral primary osteoarthritis of knee: Secondary | ICD-10-CM

## 2017-01-18 MED ORDER — TRAMADOL HCL 50 MG PO TABS: 50.0000 mg | ORAL_TABLET | Freq: Three times a day (TID) | ORAL | 0 refills | Status: DC

## 2017-01-18 NOTE — Telephone Encounter (Signed)
RX faxed to [facility] @ [phone], phone number [phone] 

## 2017-01-21 ENCOUNTER — Non-Acute Institutional Stay (SKILLED_NURSING_FACILITY): Payer: Medicaid Other | Admitting: Adult Health

## 2017-01-21 ENCOUNTER — Encounter: Payer: Self-pay | Admitting: Adult Health

## 2017-01-21 DIAGNOSIS — E1142 Type 2 diabetes mellitus with diabetic polyneuropathy: Secondary | ICD-10-CM

## 2017-01-21 DIAGNOSIS — G8929 Other chronic pain: Secondary | ICD-10-CM

## 2017-01-21 DIAGNOSIS — F10959 Alcohol use, unspecified with alcohol-induced psychotic disorder, unspecified: Secondary | ICD-10-CM | POA: Diagnosis not present

## 2017-01-21 DIAGNOSIS — R600 Localized edema: Secondary | ICD-10-CM | POA: Diagnosis not present

## 2017-01-21 DIAGNOSIS — D649 Anemia, unspecified: Secondary | ICD-10-CM | POA: Diagnosis not present

## 2017-01-21 NOTE — Progress Notes (Signed)
Location:   [facility] Room Number: 204 B Place of Service:  SNF (31)   CODE STATUS: Full Code  No Known Allergies  Chief Complaint  Patient presents with  . Medical Management of Chronic Issues    lower extremity edema; constipation; anemia; diabetes; chornic pain; pyschosis     HPI:  She is a 61 year old long term resident of this facility being seen for the management of her chronic illnesses: lower extremity edema; constipation; anemia; diabetes; chronic pain; psychosis. She continues to have lower extremity edema; her EF is 55-60%. She cannot fully participate in the hpi or ros. There are no reports of pain; changes in appetite; or changes in behavior. There are no nursing concerns at this time.   Past Medical History:  Diagnosis Date  . Acute deep vein thrombosis (DVT) of left femoral vein (HCC) 02/22/2015  . Alcohol abuse   . Arthritis   . Dementia with behavioral problem   . Diabetes mellitus without complication (HCC)   . Dysphagia    and aspiration risk  . 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 Seizures (HCC)     History reviewed. No pertinent surgical history.  Social History   Social History  . Marital status: Unknown    Spouse name: N/A  . Number of children: N/A  . Years of education: N/A   Occupational History  . Not on file.   Social History Main Topics  . Smoking status: Current Every Day Smoker    Packs/day: 0.50    Years: 15.00    Types: Cigarettes  . Smokeless tobacco: Never Used  . Alcohol use 1.2 oz/week    2 Cans of beer per week     Comment: occ  . Drug use: No  . Sexual activity: Not Currently   Other Topics Concern  . Not on file   Social History Narrative   ** Merged History Encounter **       History reviewed. No pertinent family history.    VITAL SIGNS BP 132/70   Pulse 80   Temp 98 F (36.7 C)   Resp 18   Ht 5\' 6"  (1.676 m)   Wt 154 lb 6.4 oz (70 kg)   LMP 03/25/2015 (Approximate)   SpO2 98%   BMI  24.92 kg/m   Patient's Medications  New Prescriptions   No medications on file  Previous Medications   AMINO ACIDS-PROTEIN HYDROLYS (FEEDING SUPPLEMENT, PRO-STAT SUGAR FREE 64,) LIQD    Take 30 mLs by mouth 2 (two) times daily.   CHOLECALCIFEROL (VITAMIN D) 1000 UNITS TABLET    Take 1,000 Units by mouth daily. Reported on 10/28/2015   DICLOFENAC SODIUM (VOLTAREN) 1 % GEL    Apply 2 g topically 4 (four) times daily. To both knees   GABAPENTIN (NEURONTIN) 300 MG CAPSULE    Take 3 capsules (900 mg total) by mouth 3 (three) times daily.   MELATONIN 3 MG TABS    Take 3 mg by mouth at bedtime as needed (sleep).    METFORMIN (GLUCOPHAGE-XR) 500 MG 24 HR TABLET    Take 1,000 mg by mouth daily with breakfast.    MULTIPLE VITAMINS-MINERALS (DECUBI-VITE) CAPS    Take 2 capsules by mouth at bedtime.   NUTRITIONAL SUPPLEMENTS (NUTRITIONAL SUPPLEMENT PO)    House Supplement - Give nutritional treat by mouth with meals daily for caloric and weight support   OLANZAPINE (ZYPREXA) 15 MG TABLET    Take 15 mg by mouth  at bedtime.    POLYETHYLENE GLYCOL (MIRALAX / GLYCOLAX) PACKET    Take 17 g by mouth daily.   POTASSIUM CHLORIDE SA (K-DUR,KLOR-CON) 20 MEQ TABLET    Take 20 mEq by mouth daily.   RIVAROXABAN (XARELTO) 20 MG TABS TABLET    Take 20 mg by mouth at bedtime. 20 mg one tablet by mouth daily at bedtime   SENNA (SENOKOT) 8.6 MG TABS TABLET    Take 2 tablets (17.2 mg total) by mouth daily.   THIAMINE 100 MG TABLET    Take 100 mg by mouth daily.   TORSEMIDE (DEMADEX) 20 MG TABLET    Take 20 mg by mouth 2 (two) times daily.    TRAMADOL (ULTRAM) 50 MG TABLET    Take 1 tablet (50 mg total) by mouth 3 (three) times daily.     SIGNIFICANT DIAGNOSTIC EXAMS   PREVIOUS  10-02-14: left leg doppler: +dvt  10-08-14: bilateral hip /pelvic fracture: right hip with marked osteoarthritis  04-23-15: right distal ulna and forearm: no acute fracture   10-28-15: left lower extremity doppler: negative for dvt  08-08-16:  left lower extremity doppler: + DVT  01-10-17: chest x-ray: no discernable pneumonia of congestive heart failure  TODAY:   01-18-17: 2-d echo: EF 55-60%; left ventricle hypertrophy; mild relaxation abnormality     LABS REVIEWED: PREVIOUS     02-23-16: wbc 9.7; hgb 10.7; hct 34.9; mcv 84.0; plt 257; glucose 103; bun 12.7; creat 0.53; k+ 4.1; na++ 143 hgb a1c 7.4  07-27-16: hgb a1c 7.1; chol 153; ldl 66; trig 164; hdl 55  1-61-09: wbc 12.1; hgb 11.1; hct 34.8; mcv 86.6; plt 282; glucose 106; bun 14.0; creat 0.55; k+ 3.8; na++ 140; ca 10.1; liver normal albumin 4.4  01-04-17: wbc 10.7; hgb 12.3; hct 37.3; mcv 83.6; plt 227; glucose 120; bun 13.6; creat 0.58; k+ 3.7; na++ 140; ca 9.4  01-11-17: BNP 339.10  NO NEW LABS  Review of Systems  Unable to perform ROS: Dementia (confused and unable to answer questions )   Physical Exam  Constitutional: No distress.  Frail   Eyes: Conjunctivae are normal.  Neck: Neck supple. No JVD present. No thyromegaly present.  Cardiovascular: Normal rate and regular rhythm.   Pedal pulses faint  Respiratory: Effort normal and breath sounds normal. No respiratory distress. She has no wheezes.  GI: Soft. Bowel sounds are normal. She exhibits no distension. There is no tenderness.  Musculoskeletal: She exhibits edema.  Able to move all extremities  Leans to left in wheelchair Has 2-3+ lower extremity edema bilaterally   Lymphadenopathy:    She has no cervical adenopathy.  Neurological: She is alert.  Skin: Skin is warm and dry. She is not diaphoretic.  Psychiatric: She has a normal mood and affect.    ASSESSMENT/ PLAN:  TODAY   1. Bilateral lower extremity edema: no change in status:  Will increase demadex to 30 mg twice daily and k+ to 20 meq twice daily will begin daily weights and will continue to monitor her status.  2. Constipation: stable will miralax daily and senna 2 tabs daily   3. Anemia: hgb 12.3; stable  will monitor   4. Diabetes: stable  hgb a1c is 7.1  Urine for micro-albumin is 7.4;   Will continue metformin xr  1 gm daily; is not on statin her ldl is 66; is not on ace/arb she could not tolerate    5. 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    6. Psychosis; due to alcohol: she is presently without change in status; will continue zyprexa 15 mg daily; thiamine 100 mg daily; she is no longer on prn ativan.    PREVIOUS  7. 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 Her current weight is 154 pounds her weight in April 2017 was 150 pounds.    8. DVT left leg:  (08-08-16; #2) stable  will continue xarelto 20 mg nightly she is on long term therapy there is no pain to palpation in her calf and thigh will monitor this at this time.      9. Constipation: stable will miralax daily and senna 2 tabs daily   10. Anemia: hgb 12.3; stable  will monitor    MD is aware of resident's narcotic use and is in agreement with current plan of care. We will attempt to wean resident as apropriate   Synthia Innocent NP [facility]  Contact [phone] Monday through Friday 8am- 5pm  After hours call [phone]

## 2017-01-22 ENCOUNTER — Other Ambulatory Visit: Payer: Self-pay

## 2017-01-22 DIAGNOSIS — M170 Bilateral primary osteoarthritis of knee: Secondary | ICD-10-CM

## 2017-01-22 MED ORDER — TRAMADOL HCL 50 MG PO TABS: 50.0000 mg | ORAL_TABLET | Freq: Three times a day (TID) | ORAL | 0 refills | Status: DC

## 2017-01-22 NOTE — Telephone Encounter (Signed)
RX faxed to [facility] @ [phone], phone number [phone] 

## 2017-02-02 ENCOUNTER — Other Ambulatory Visit: Payer: Self-pay

## 2017-02-02 DIAGNOSIS — M170 Bilateral primary osteoarthritis of knee: Secondary | ICD-10-CM

## 2017-02-02 MED ORDER — TRAMADOL HCL 50 MG PO TABS: 50.0000 mg | ORAL_TABLET | Freq: Three times a day (TID) | ORAL | 0 refills | Status: DC

## 2017-02-02 NOTE — Telephone Encounter (Signed)
RX faxed to [facility] @ [phone], phone number [phone] 

## 2017-02-15 ENCOUNTER — Encounter: Payer: Self-pay | Admitting: Adult Health

## 2017-02-15 ENCOUNTER — Non-Acute Institutional Stay (SKILLED_NURSING_FACILITY): Payer: Medicaid Other | Admitting: Adult Health

## 2017-02-15 DIAGNOSIS — R634 Abnormal weight loss: Secondary | ICD-10-CM | POA: Diagnosis not present

## 2017-02-15 DIAGNOSIS — F1027 Alcohol dependence with alcohol-induced persisting dementia: Secondary | ICD-10-CM

## 2017-02-15 NOTE — Progress Notes (Signed)
Location:   [facility] Room Number: 204 B Place of Service:  SNF (31)   CODE STATUS:  Full Code  No Known Allergies  Chief Complaint  Patient presents with  . Acute Visit    Weight Loss    HPI:  She has been slowly losing weight. Her weight July 2018: 154.8 pounds; her current weight: 140.3 pounds. There are no reports of changes in appetite; no reports of changes in behavior; no reports of pain present. She is unable to fully participate in the hpi or ros. There are no nursing concerns today.   Past Medical History:  Diagnosis Date  . Acute deep vein thrombosis (DVT) of left femoral vein (HCC) 02/22/2015  . Alcohol abuse   . Arthritis   . Dementia with behavioral problem   . Diabetes mellitus without complication (HCC)   . Dysphagia    and aspiration risk  . 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 Seizures (HCC)     History reviewed. No pertinent surgical history.  Social History   Social History  . Marital status: Unknown    Spouse name: N/A  . Number of children: N/A  . Years of education: N/A   Occupational History  . Not on file.   Social History Main Topics  . Smoking status: Current Every Day Smoker    Packs/day: 0.50    Years: 15.00    Types: Cigarettes  . Smokeless tobacco: Never Used  . Alcohol use 1.2 oz/week    2 Cans of beer per week     Comment: occ  . Drug use: No  . Sexual activity: Not Currently   Other Topics Concern  . Not on file   Social History Narrative   ** Merged History Encounter **       History reviewed. No pertinent family history.    VITAL SIGNS BP 102/68   Pulse (!) 101   Temp 98 F (36.7 C)   Resp (!) 22   Ht  (1.676 m)   Wt 150 lb 4.8 oz (68.2 kg)   LMP 03/25/2015 (Approximate)   SpO2 99%   BMI 24.26 kg/m   Patient's Medications  New Prescriptions   No medications on file  Previous Medications   AMINO ACIDS-PROTEIN HYDROLYS (FEEDING SUPPLEMENT, PRO-STAT SUGAR FREE 64,) LIQD    Take 30  mLs by mouth 2 (two) times daily.   CHOLECALCIFEROL (VITAMIN D) 1000 UNITS TABLET    Take 1,000 Units by mouth daily. Reported on 10/28/2015   DICLOFENAC SODIUM (VOLTAREN) 1 % GEL    Apply 2 g topically 4 (four) times daily. To both knees   GABAPENTIN (NEURONTIN) 300 MG CAPSULE    Take 3 capsules (900 mg total) by mouth 3 (three) times daily.   MELATONIN 3 MG TABS    Take 3 mg by mouth at bedtime as needed (sleep).    METFORMIN (GLUCOPHAGE-XR) 500 MG 24 HR TABLET    Take 1,000 mg by mouth daily with breakfast.    MULTIPLE VITAMINS-MINERALS (DECUBI-VITE) CAPS    Take 2 capsules by mouth at bedtime.   NUTRITIONAL SUPPLEMENTS (NUTRITIONAL SUPPLEMENT PO)    House Supplement - Give nutritional treat by mouth with meals daily for caloric and weight support   OLANZAPINE (ZYPREXA) 15 MG TABLET    Take 15 mg by mouth at bedtime.    POLYETHYLENE GLYCOL (MIRALAX / GLYCOLAX) PACKET    Take 17 g by mouth daily.   POTASSIUM CHLORIDE SA (K-DUR,KLOR-CON)  20 MEQ TABLET    Take 20 mEq by mouth daily.   RIVAROXABAN (XARELTO) 20 MG TABS TABLET    Take 20 mg by mouth at bedtime. 20 mg one tablet by mouth daily at bedtime   SENNA (SENOKOT) 8.6 MG TABS TABLET    Take 2 tablets (17.2 mg total) by mouth daily.   THIAMINE 100 MG TABLET    Take 100 mg by mouth daily.   TORSEMIDE (DEMADEX) 20 MG TABLET    Give 1.5 tablet ( ) by mouth two times daily   TRAMADOL (ULTRAM) 50 MG TABLET    Take 1 tablet (50 mg total) by mouth 3 (three) times daily.  Modified Medications   No medications on file  Discontinued Medications   No medications on file     SIGNIFICANT DIAGNOSTIC EXAMS  PREVIOUS  10-02-14: left leg doppler: +dvt  10-08-14: bilateral hip /pelvic fracture: right hip with marked osteoarthritis  04-23-15: right distal ulna and forearm: no acute fracture   10-28-15: left lower extremity doppler: negative for dvt  08-08-16: left lower extremity doppler: + DVT  01-10-17: chest x-ray: no discernable pneumonia or  congestive heart failure  01-18-17: 2-d echo: EF 55-60%; left ventricle hypertrophy; mild relaxation abnormality   NO NEW EXAMS     LABS REVIEWED: PREVIOUS     02-23-16: wbc 9.7; hgb 10.7; hct 34.9; mcv 84.0; plt 257; glucose 103; bun 12.7; creat 0.53; k+ 4.1; na++ 143 hgb a1c 7.4  07-27-16: hgb a1c 7.1; chol 153; ldl 66; trig 164; hdl 55  1-61-09: wbc 12.1; hgb 11.1; hct 34.8; mcv 86.6; plt 282; glucose 106; bun 14.0; creat 0.55; k+ 3.8; na++ 140; ca 10.1; liver normal albumin 4.4  01-04-17: wbc 10.7; hgb 12.3; hct 37.3; mcv 83.6; plt 227; glucose 120; bun 13.6; creat 0.58; k+ 3.7; na++ 140; ca 9.4  01-11-17: BNP 339.10  NO NEW LABS  Review of Systems  Unable to perform ROS: Dementia (confused and unable to answer questions )   Physical Exam  Constitutional: No distress.  Frail   Eyes: Conjunctivae are normal.  Neck: Neck supple. No thyromegaly present.  Cardiovascular: Normal rate, regular rhythm and normal heart sounds.   Pedal pulses faint   Pulmonary/Chest: Effort normal and breath sounds normal. No respiratory distress.  Abdominal: Soft. Bowel sounds are normal. She exhibits no distension. There is no tenderness.  Musculoskeletal:  Able to move all extremities  Leans to left in wheelchair Has 2-3+ lower extremity edema bilaterally  Lymphadenopathy:    She has no cervical adenopathy.  Neurological: She is alert.  Skin: Skin is warm and dry. She is not diaphoretic.  Psychiatric: She has a normal mood and affect.     ASSESSMENT/ PLAN:  TODAY   1. 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 Her current weight is 140.3 pounds her weight in July 2018: 154 pounds   2. Weight loss: her current weight is 140.3 pounds; will check vit B 12; folate and tsh; will begin remeron 7.5 mg nightly for 30 days for weight loss.     MD is aware of resident's narcotic  use and is in agreement with current plan of care. We will attempt to wean resident as apropriate     Synthia Innocent NP [facility]  Contact [phone] Monday through Friday 8am- 5pm  After hours call [phone]

## 2017-02-16 LAB — TSH: TSH: 1.39 (ref 0.41–5.90)

## 2017-02-16 LAB — VITAMIN B12: VITAMIN B 12: 1092

## 2017-02-21 ENCOUNTER — Encounter: Payer: Self-pay | Admitting: Adult Health

## 2017-02-21 ENCOUNTER — Non-Acute Institutional Stay (SKILLED_NURSING_FACILITY): Payer: Medicaid Other | Admitting: Adult Health

## 2017-02-21 DIAGNOSIS — M170 Bilateral primary osteoarthritis of knee: Secondary | ICD-10-CM

## 2017-02-21 DIAGNOSIS — E1142 Type 2 diabetes mellitus with diabetic polyneuropathy: Secondary | ICD-10-CM | POA: Diagnosis not present

## 2017-02-21 DIAGNOSIS — G8929 Other chronic pain: Secondary | ICD-10-CM | POA: Diagnosis not present

## 2017-02-21 DIAGNOSIS — R634 Abnormal weight loss: Secondary | ICD-10-CM | POA: Diagnosis not present

## 2017-02-21 DIAGNOSIS — F10959 Alcohol use, unspecified with alcohol-induced psychotic disorder, unspecified: Secondary | ICD-10-CM

## 2017-02-21 DIAGNOSIS — F1027 Alcohol dependence with alcohol-induced persisting dementia: Secondary | ICD-10-CM

## 2017-02-21 NOTE — Progress Notes (Signed)
Location:   [facility] Room Number: 204 B Place of Service:  SNF (31)   CODE STATUS: Full Code  No Known Allergies  Chief Complaint  Patient presents with  . Medical Management of Chronic Issues    Diabetes; chronic pain with osteoarthritis; psychosis; dementia; weight loss     HPI:  She is a 61 year old long term resident of this facility being seen for the management of her chronic illnesses: diabetes; chronic pain with osteoarthritis of knees; psychosis; dementia and weight loss. She is unable to fully participate in the hpi or ros. There are no reports of pain; behavioral issues or changes in appetite. There are no nursing concerns at this time.    Past Medical History:  Diagnosis Date  . Acute deep vein thrombosis (DVT) of left femoral vein (HCC) 02/22/2015  . Alcohol abuse   . Arthritis   . Dementia with behavioral problem   . Diabetes mellitus without complication (HCC)   . Dysphagia    and aspiration risk  . 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 Seizures (HCC)     History reviewed. No pertinent surgical history.  Social History   Social History  . Marital status: Unknown    Spouse name: N/A  . Number of children: N/A  . Years of education: N/A   Occupational History  . Not on file.   Social History Main Topics  . Smoking status: Current Every Day Smoker    Packs/day: 0.50    Years: 15.00    Types: Cigarettes  . Smokeless tobacco: Never Used  . Alcohol use 1.2 oz/week    2 Cans of beer per week     Comment: occ  . Drug use: No  . Sexual activity: Not Currently   Other Topics Concern  . Not on file   Social History Narrative   ** Merged History Encounter **       History reviewed. No pertinent family history.    VITAL SIGNS Ht  (1.676 m)   Wt 151 lb (68.5 kg)   LMP 03/25/2015 (Approximate)   BMI 24.37 kg/m   Patient's Medications  New Prescriptions   No medications on file  Previous Medications   AMINO ACIDS-PROTEIN  HYDROLYS (FEEDING SUPPLEMENT, PRO-STAT SUGAR FREE 64,) LIQD    Take 30 mLs by mouth 2 (two) times daily.   CHOLECALCIFEROL (VITAMIN D) 1000 UNITS TABLET    Take 1,000 Units by mouth daily. Reported on 10/28/2015   DICLOFENAC SODIUM (VOLTAREN) 1 % GEL    Apply 2 g topically 4 (four) times daily. To both knees   GABAPENTIN (NEURONTIN) 300 MG CAPSULE    Take 3 capsules (900 mg total) by mouth 3 (three) times daily.   MELATONIN 3 MG TABS    Take 3 mg by mouth at bedtime as needed (sleep).    METFORMIN (GLUCOPHAGE-XR) 500 MG 24 HR TABLET    Take 1,000 mg by mouth daily with breakfast.    MIRTAZAPINE (REMERON) 7.5 MG TABLET    Take 7.5 mg by mouth at bedtime.   MULTIPLE VITAMINS-MINERALS (DECUBI-VITE) CAPS    Take 2 capsules by mouth at bedtime.   NUTRITIONAL SUPPLEMENTS (NUTRITIONAL SUPPLEMENT PO)    House Supplement - Give nutritional treat by mouth with meals daily for caloric and weight support   NUTRITIONAL SUPPLEMENTS (NUTRITIONAL SUPPLEMENT PO)    HSG Regular Diet -  HSG Mech Soft Texture, Regular consistency, patient may have regular texture food items from  bristro menu for diet order   OLANZAPINE (ZYPREXA) 10 MG TABLET    Take 10 mg by mouth at bedtime.    POLYETHYLENE GLYCOL (MIRALAX / GLYCOLAX) PACKET    Take 17 g by mouth daily.   POTASSIUM CHLORIDE SA (K-DUR,KLOR-CON) 20 MEQ TABLET    Take 20 mEq by mouth 2 (two) times daily.    RIVAROXABAN (XARELTO) 20 MG TABS TABLET    Take 20 mg by mouth at bedtime. 20 mg one tablet by mouth daily at bedtime   SENNA (SENOKOT) 8.6 MG TABS TABLET    Take 2 tablets (17.2 mg total) by mouth daily.   THIAMINE 100 MG TABLET    Take 100 mg by mouth daily.   TORSEMIDE (DEMADEX) 20 MG TABLET    Give 1.5 tablet ( ) by mouth two times daily   TRAMADOL (ULTRAM) 50 MG TABLET    Take 1 tablet (50 mg total) by mouth 3 (three) times daily.  Modified Medications   No medications on file  Discontinued Medications   No medications on file     SIGNIFICANT DIAGNOSTIC  EXAMS   PREVIOUS  10-02-14: left leg doppler: +dvt  10-08-14: bilateral hip /pelvic fracture: right hip with marked osteoarthritis  04-23-15: right distal ulna and forearm: no acute fracture   10-28-15: left lower extremity doppler: negative for dvt  08-08-16: left lower extremity doppler: + DVT  01-10-17: chest x-ray: no discernable pneumonia or congestive heart failure  01-18-17: 2-d echo: EF 55-60%; left ventricle hypertrophy; mild relaxation abnormality   NO NEW EXAMS     LABS REVIEWED: PREVIOUS     02-23-16: wbc 9.7; hgb 10.7; hct 34.9; mcv 84.0; plt 257; glucose 103; bun 12.7; creat 0.53; k+ 4.1; na++ 143 hgb a1c 7.4  07-27-16: hgb a1c 7.1; chol 153; ldl 66; trig 164; hdl 55  1-61-09: wbc 12.1; hgb 11.1; hct 34.8; mcv 86.6; plt 282; glucose 106; bun 14.0; creat 0.55; k+ 3.8; na++ 140; ca 10.1; liver normal albumin 4.4  01-04-17: wbc 10.7; hgb 12.3; hct 37.3; mcv 83.6; plt 227; glucose 120; bun 13.6; creat 0.58; k+ 3.7; na++ 140; ca 9.4  01-11-17: BNP 339.10  NO NEW LABS  Review of Systems  Unable to perform ROS: Dementia (is confused )   Physical Exam  Constitutional: No distress.  Frail   Eyes: Conjunctivae are normal.  Neck: Neck supple. No thyromegaly present.  Cardiovascular: Normal rate, regular rhythm and normal heart sounds.   Pedal pulses faint   Pulmonary/Chest: Effort normal and breath sounds normal. No respiratory distress.  Abdominal: Soft. Bowel sounds are normal. She exhibits no distension. There is no tenderness.  Musculoskeletal: She exhibits edema.  Able to move all extremities  Leans to left in wheelchair Has 2+ lower extremity edema bilaterally   Lymphadenopathy:    She has no cervical adenopathy.  Neurological: She is alert.  Skin: Skin is warm and dry. She is not diaphoretic.  Psychiatric: She has a normal mood and affect.    ASSESSMENT/ PLAN:  TODAY   1. Diabetes: stable hgb a1c is 7.1  Urine for micro-albumin is 7.4;   Will continue metformin  xr  1 gm daily; is not on statin her ldl is 66; is not on ace/arb due to blood pressure of 116/78.     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    3. Psychosis; due to alcohol: she is presently without change in status; will continue zyprexa 15 mg daily; thiamine 100 mg daily; she has ativan 0.5 mg every 8 hours as needed for her episodic anxiety; she does not need this on a routine basis; but does benefit from having an occasional dose to manage her anxiety episodes.  will monitor  4. 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 Her current weight is 151 pounds her weight in July 2018: 154 pounds   5. Weight loss: her current weight is 151 pounds; complete begin remeron 7.5 mg nightly for 30 days for weight loss.     PREVIOUS  6. DVT left leg:  (08-08-16; #2) stable  will continue xarelto 20 mg nightly she is on long term therapy there is no pain to palpation in her calf and thigh will monitor this at this time.      7. Bilateral lower extremity edema: no change in status:  will continue dementia  20 mg twice daily with k+ 20 meq daily she   8. Constipation: stable will miralax daily and senna 2 tabs daily   9. Anemia: hgb 12.3; stable  will monitor    MD is aware of resident's narcotic use and is in agreement with current plan of care. We will attempt to wean resident as apropriate   Synthia Innocent NP [facility]  Contact [phone] Monday through Friday 8am- 5pm  After hours call [phone]

## 2017-02-25 DIAGNOSIS — R634 Abnormal weight loss: Secondary | ICD-10-CM | POA: Insufficient documentation

## 2017-02-27 ENCOUNTER — Encounter: Payer: Self-pay | Admitting: Adult Health

## 2017-02-27 NOTE — Progress Notes (Signed)
Location:   [facility] Room Number: 204 B Place of Service:  SNF (31)   CODE STATUS: Full Code  No Known Allergies  Chief Complaint  Patient presents with  . Acute Visit    Distended abdomen    HPI:    Past Medical History:  Diagnosis Date  . Acute deep vein thrombosis (DVT) of left femoral vein (HCC) 02/22/2015  . Alcohol abuse   . Arthritis   . Dementia with behavioral problem   . Diabetes mellitus without complication (HCC)   . Dysphagia    and aspiration risk  . 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 Seizures (HCC)     History reviewed. No pertinent surgical history.  Social History   Social History  . Marital status: Unknown    Spouse name: N/A  . Number of children: N/A  . Years of education: N/A   Occupational History  . Not on file.   Social History Main Topics  . Smoking status: Current Every Day Smoker    Packs/day: 0.50    Years: 15.00    Types: Cigarettes  . Smokeless tobacco: Never Used  . Alcohol use 1.2 oz/week    2 Cans of beer per week     Comment: occ  . Drug use: No  . Sexual activity: Not Currently   Other Topics Concern  . Not on file   Social History Narrative   ** Merged History Encounter **       History reviewed. No pertinent family history.    VITAL SIGNS BP 130/80   Pulse 89   Temp (!) 97.4 F (36.3 C)   Resp 19   Ht  (1.676 m)   Wt 145 lb 12.8 oz (66.1 kg)   LMP 03/25/2015 (Approximate)   SpO2 97%   BMI 23.53 kg/m   Patient's Medications  New Prescriptions   No medications on file  Previous Medications   AMINO ACIDS-PROTEIN HYDROLYS (FEEDING SUPPLEMENT, PRO-STAT SUGAR FREE 64,) LIQD    Take 30 mLs by mouth 2 (two) times daily.   CHOLECALCIFEROL (VITAMIN D) 1000 UNITS TABLET    Take 1,000 Units by mouth daily. Reported on 10/28/2015   DICLOFENAC SODIUM (VOLTAREN) 1 % GEL    Apply 2 g topically 4 (four) times daily. To both knees   GABAPENTIN (NEURONTIN) 300 MG CAPSULE    Take 3 capsules (900  mg total) by mouth 3 (three) times daily.   MELATONIN 3 MG TABS    Take 3 mg by mouth at bedtime as needed (sleep).    METFORMIN (GLUCOPHAGE-XR) 500 MG 24 HR TABLET    Take 1,000 mg by mouth daily with breakfast.    MIRTAZAPINE (REMERON) 7.5 MG TABLET    Take 7.5 mg by mouth at bedtime.   MULTIPLE VITAMINS-MINERALS (DECUBI-VITE) CAPS    Take 2 capsules by mouth at bedtime.   NUTRITIONAL SUPPLEMENTS (NUTRITIONAL SUPPLEMENT PO)    House Supplement - Give nutritional treat by mouth with meals daily for caloric and weight support   NUTRITIONAL SUPPLEMENTS (NUTRITIONAL SUPPLEMENT PO)    HSG Regular Diet -  HSG Mech Soft Texture, Regular consistency, patient may have regular texture food items from bristro menu for diet order   OLANZAPINE (ZYPREXA) 10 MG TABLET    Take 10 mg by mouth at bedtime.    POLYETHYLENE GLYCOL (MIRALAX / GLYCOLAX) PACKET    Take 17 g by mouth daily.   POTASSIUM CHLORIDE SA (K-DUR,KLOR-CON) 20 MEQ TABLET  Take 20 mEq by mouth 2 (two) times daily.    RIVAROXABAN (XARELTO) 20 MG TABS TABLET    Take 20 mg by mouth at bedtime. 20 mg one tablet by mouth daily at bedtime   SENNA (SENOKOT) 8.6 MG TABS TABLET    Take 2 tablets (17.2 mg total) by mouth daily.   THIAMINE 100 MG TABLET    Take 100 mg by mouth daily.   TORSEMIDE (DEMADEX) 20 MG TABLET    Give 1.5 tablet ( ) by mouth two times daily   TRAMADOL (ULTRAM) 50 MG TABLET    Take 1 tablet (50 mg total) by mouth 3 (three) times daily.  Modified Medications   No medications on file  Discontinued Medications   No medications on file     SIGNIFICANT DIAGNOSTIC EXAMS  PREVIOUS  10-02-14: left leg doppler: +dvt  10-08-14: bilateral hip /pelvic fracture: right hip with marked osteoarthritis  04-23-15: right distal ulna and forearm: no acute fracture   10-28-15: left lower extremity doppler: negative for dvt  08-08-16: left lower extremity doppler: + DVT  01-10-17: chest x-ray: no discernable pneumonia or congestive heart  failure  01-18-17: 2-d echo: EF 55-60%; left ventricle hypertrophy; mild relaxation abnormality   NO NEW EXAMS     LABS REVIEWED: PREVIOUS     02-23-16: wbc 9.7; hgb 10.7; hct 34.9; mcv 84.0; plt 257; glucose 103; bun 12.7; creat 0.53; k+ 4.1; na++ 143 hgb a1c 7.4  07-27-16: hgb a1c 7.1; chol 153; ldl 66; trig 164; hdl 55  09-05-79: wbc 12.1; hgb 11.1; hct 34.8; mcv 86.6; plt 282; glucose 106; bun 14.0; creat 0.55; k+ 3.8; na++ 140; ca 10.1; liver normal albumin 4.4  01-04-17: wbc 10.7; hgb 12.3; hct 37.3; mcv 83.6; plt 227; glucose 120; bun 13.6; creat 0.58; k+ 3.7; na++ 140; ca 9.4  01-11-17: BNP 339.10  NO NEW LABS  Review of Systems  Unable to perform ROS: Dementia (is confused )   Physical Exam  Constitutional: No distress.  Frail   Eyes: Conjunctivae are normal.  Neck: Neck supple. No thyromegaly present.  Cardiovascular: Normal rate, regular rhythm and normal heart sounds.   Pedal pulses faint   Pulmonary/Chest: Effort normal and breath sounds normal. No respiratory distress.  Abdominal: Soft. Bowel sounds are normal. She exhibits no distension. There is no tenderness.  Musculoskeletal: She exhibits edema.  Able to move all extremities  Leans to left in wheelchair Has 2+ lower extremity edema bilaterally   Lymphadenopathy:    She has no cervical adenopathy.  Neurological: She is alert.  Skin: Skin is warm and dry. She is not diaphoretic.  Psychiatric: She has a normal mood and affect.    ASSESSMENT/ PLAN:  TODAY   1. Diabetes: stable hgb a1c is 7.1  Urine for micro-albumin is 7.4;   Will continue metformin xr  1 gm daily; is not on statin her ldl is 66; is not on ace/arb due to blood pressure of 116/78.     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    3. Psychosis; due to alcohol: she is presently without change in status; will continue zyprexa 15 mg daily; thiamine 100 mg daily; she has ativan 0.5 mg every 8 hours as needed  for her episodic anxiety; she does not need this on a routine basis; but does benefit from having an occasional dose to manage her anxiety episodes.  will monitor  4. 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 Her current weight is 151 pounds her weight in July 2018: 154 pounds   5. Weight loss: her current weight is 151 pounds; complete begin remeron 7.5 mg nightly for 30 days for weight loss.     PREVIOUS  6. DVT left leg:  (08-08-16; #2) stable  will continue xarelto 20 mg nightly she is on long term therapy there is no pain to palpation in her calf and thigh will monitor this at this time.      7. Bilateral lower extremity edema: no change in status:  will continue dementia  20 mg twice daily with k+ 20 meq daily she   8. Constipation: stable will miralax daily and senna 2 tabs daily   9. Anemia: hgb 12.3; stable  will monitor    MD is aware of resident's narcotic use and is in agreement with current plan of care. We will attempt to wean resident as apropriate    Synthia Innocent NP [facility]  Contact [phone] Monday through Friday 8am- 5pm  After hours call [phone]

## 2017-03-01 ENCOUNTER — Encounter (HOSPITAL_COMMUNITY): Payer: Self-pay | Admitting: *Deleted

## 2017-03-01 ENCOUNTER — Emergency Department (HOSPITAL_COMMUNITY)
Admission: EM | Admit: 2017-03-01 | Discharge: 2017-03-01 | Disposition: A | Discharge status: Home / Self Care | Payer: Medicaid Other | Attending: Emergency Medicine | Admitting: Emergency Medicine

## 2017-03-01 DIAGNOSIS — Z7984 Long term (current) use of oral hypoglycemic drugs: Secondary | ICD-10-CM | POA: Insufficient documentation

## 2017-03-01 DIAGNOSIS — Z7901 Long term (current) use of anticoagulants: Secondary | ICD-10-CM | POA: Diagnosis not present

## 2017-03-01 DIAGNOSIS — Z79899 Other long term (current) drug therapy: Secondary | ICD-10-CM | POA: Insufficient documentation

## 2017-03-01 DIAGNOSIS — F17210 Nicotine dependence, cigarettes, uncomplicated: Secondary | ICD-10-CM | POA: Insufficient documentation

## 2017-03-01 DIAGNOSIS — E1142 Type 2 diabetes mellitus with diabetic polyneuropathy: Secondary | ICD-10-CM | POA: Diagnosis not present

## 2017-03-01 DIAGNOSIS — R4182 Altered mental status, unspecified: Secondary | ICD-10-CM | POA: Diagnosis present

## 2017-03-01 DIAGNOSIS — F0391 Unspecified dementia with behavioral disturbance: Secondary | ICD-10-CM | POA: Diagnosis not present

## 2017-03-01 NOTE — ED Provider Notes (Signed)
[facility]-EMERGENCY DEPT Provider Note   CSN: [id number] Arrival date & time: 03/01/17  8295     History   Chief Complaint Chief Complaint  Patient presents with  . Altered Mental Status    HPI Sarah Mckay is a 61 y.o. female.  Patient from nursing home with concern for unresponsiveness. Per EMS report, the nursing home staff could not get a response from the patient and called for emergency assistance. On their arrival the patient was reportedly woken by EMS staff and found to be at baseline. The patient has a history of advanced dementia. She does not complaint of any pain. No falls. There was no reported vomiting or fever.    The history is provided by the patient. No language interpreter was used.  Altered Mental Status      Past Medical History:  Diagnosis Date  . Acute deep vein thrombosis (DVT) of left femoral vein (HCC) 02/22/2015  . Alcohol abuse   . Arthritis   . Dementia with behavioral problem   . Diabetes mellitus without complication (HCC)   . Dysphagia    and aspiration risk  . 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 Seizures [facility])     Patient Active Problem List   Diagnosis Date Noted  . Weight loss, abnormal 02/25/2017  . Upper leg DVT (deep venous thromboembolism), chronic, left (HCC) 10/19/2016  . 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 Bilateral lower extremity edema 12/21/2015  . Anxiety 09/16/2015  . Constipation 08/26/2015  . Anemia 08/26/2015  . Polyneuropathy due to type 2 diabetes mellitus (HCC) 03/25/2015  . Diabetes mellitus with neurological manifestations (HCC) 10/22/2014  . Primary osteoarthritis of both knees 06/03/2014  . Chronic pain 05/13/2014  . Alcohol abuse   . Dysphagia   . 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 [facility])     History reviewed. No pertinent surgical history.  OB History    No data available       Home Medications    Prior to Admission medications   Medication Sig Start Date End Date Taking? Authorizing Provider    Amino Acids-Protein Hydrolys (FEEDING SUPPLEMENT, PRO-STAT SUGAR FREE 64,) LIQD Take 30 mLs by mouth 2 (two) times daily.    [provider]  cholecalciferol (VITAMIN D) 1000 UNITS tablet Take 1,000 Units by mouth daily. Reported on 10/28/2015    [provider]  diclofenac sodium (VOLTAREN) 1 % GEL Apply 2 g topically 4 (four) times daily. To both knees 06/03/14   Sharee Holster, NP  gabapentin (NEURONTIN) 300 MG capsule Take 3 capsules (900 mg total) by mouth 3 (three) times daily. 06/30/14   Sharee Holster, NP  Melatonin 3 MG TABS Take 3 mg by mouth at bedtime as needed (sleep).     [provider]  metFORMIN (GLUCOPHAGE-XR) 500 MG 24 hr tablet Take 1,000 mg by mouth daily with breakfast.     [provider]  mirtazapine (REMERON) 7.5 MG tablet Take 7.5 mg by mouth at bedtime.    [provider]  Multiple Vitamins-Minerals (DECUBI-VITE) CAPS Take 2 capsules by mouth at bedtime.    [provider]  Nutritional Supplements (NUTRITIONAL SUPPLEMENT PO) House Supplement - Give nutritional treat by mouth with meals daily for caloric and weight support    [provider]  Nutritional Supplements (NUTRITIONAL SUPPLEMENT PO) HSG Regular Diet -  HSG Mech Soft Texture, Regular consistency, patient may have regular texture food items from [employer] for diet order    [provider]  OLANZapine (ZYPREXA) 10 MG tablet Take 10 mg by mouth at bedtime.     [provider]  polyethylene glycol (MIRALAX / GLYCOLAX) packet Take 17 g by mouth daily. 08/28/15   Clydia Llano, MD  potassium chloride SA (K-DUR,KLOR-CON) 20 MEQ tablet Take 20 mEq by mouth 2 (two) times daily.     [provider]  rivaroxaban (XARELTO) 20 MG TABS tablet Take 20 mg by mouth at bedtime. 20 mg one tablet by mouth daily at bedtime    [provider]  senna (SENOKOT) 8.6 MG TABS tablet Take 2 tablets (17.2 mg total) by mouth daily. 08/28/15   Clydia Llano, MD  thiamine 100 MG tablet Take 100 mg by mouth daily.    [provider]  torsemide (DEMADEX) 20 MG tablet Give 1.5 tablet ( ) by mouth two times daily    [provider]  traMADol (ULTRAM) 50 MG tablet Take 1 tablet (50 mg total) by mouth 3 (three) times daily. 02/02/17   Sharee Holster, NP    Family History No family history on file.  Social History Social History  Substance Use Topics  . Smoking status: Current Every Day Smoker    Packs/day: 0.50    Years: 15.00    Types: Cigarettes  . Smokeless tobacco: Never Used  . Alcohol use 1.2 oz/week    2 Cans of beer per week     Comment: occ     Allergies   Patient has no known allergies.   Review of Systems Review of Systems  Unable to perform ROS: Dementia     Physical Exam Updated Vital Signs BP 124/72 (BP Location: Right Arm)   Pulse (!) 102   Temp 98.6 F (37 C) (Oral)   Resp 20   LMP 03/25/2015 (Approximate)   SpO2 99%   Physical Exam  Constitutional: She appears well-developed and well-nourished. No distress.  HENT:  Head: Atraumatic.  Neck: Normal range of motion.  Cardiovascular: Normal rate and regular rhythm.   No murmur heard. Pulmonary/Chest: Effort normal. She has no wheezes. She has no rales.  Abdominal: Soft. There is no tenderness.  Abdomen soft but distended. Not apparently tender.  Musculoskeletal: She exhibits no edema.  Neurological:  Patient wakes easily. She follows command. She is disoriented to time and place.  Skin: Skin is warm and dry.     ED Treatments / Results  Labs (all labs ordered are listed, but only abnormal results are displayed) Labs Reviewed - No data to display  EKG  EKG Interpretation None       Radiology No results found.  Procedures Procedures (including critical care time)  Medications Ordered in ED Medications - No data to display   Initial Impression / Assessment and Plan / ED Course  I have reviewed the triage  vital signs and the nursing notes.  Pertinent labs & imaging results that were available during my care of the patient were reviewed by me and considered in my medical decision making (see chart for details).     Patient from nursing home with concern for altered mental status, responsive and appropriate per EMS and on ED evaluation. She is examined by Dr. Preston Fleeting and felt appropriate for discharge home.   Final Clinical Impressions(s) / ED Diagnoses   Final diagnoses:  None   1. Dementia   New Prescriptions New Prescriptions   No medications on file     Elpidio Anis, Cordelia Poche 03/01/17 1610  Dione Booze, MD 03/01/17 [phone]

## 2017-03-01 NOTE — ED Triage Notes (Signed)
Per ems NH called for patient being unresp. States when they arrived patient was resp. And back to her baseline per staff, however wanted her checked put anyway.

## 2017-03-01 NOTE — ED Notes (Signed)
Waiting on PTAR... 

## 2017-03-05 ENCOUNTER — Non-Acute Institutional Stay (SKILLED_NURSING_FACILITY): Payer: Medicaid Other | Admitting: Adult Health

## 2017-03-05 ENCOUNTER — Encounter: Payer: Self-pay | Admitting: Adult Health

## 2017-03-05 DIAGNOSIS — M170 Bilateral primary osteoarthritis of knee: Secondary | ICD-10-CM

## 2017-03-05 DIAGNOSIS — K5909 Other constipation: Secondary | ICD-10-CM

## 2017-03-05 DIAGNOSIS — G8929 Other chronic pain: Secondary | ICD-10-CM | POA: Diagnosis not present

## 2017-03-05 NOTE — Progress Notes (Signed)
Location:   [facility] Room Number: 204 B Place of Service:  SNF (31)   CODE STATUS: Full Code  No Known Allergies  Chief Complaint  Patient presents with  . Acute Visit    Bilateral Leg Pain    HPI:  Staff report that she is having bilateral leg pain she tells me that both legs hurt with the left worse than the right. She is unable fully describe her pain; but tells me that the pain is "pretty bad". She does have bilateral lower extremity with the  left worse than the right. She does have a history of dvt in left lower leg. She states that the pain is interfering with her sleep. The nursing staff would like to have her pain medication adjusted.  Her kub from 02-28-17 demonstrates constipation  Past Medical History:  Diagnosis Date  . Acute deep vein thrombosis (DVT) of left femoral vein (HCC) 02/22/2015  . Alcohol abuse   . Arthritis   . Dementia with behavioral problem   . Diabetes mellitus without complication (HCC)   . Dysphagia    and aspiration risk  . 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 Seizures (HCC)     History reviewed. No pertinent surgical history.  Social History   Social History  . Marital status: Unknown    Spouse name: N/A  . Number of children: N/A  . Years of education: N/A   Occupational History  . Not on file.   Social History Main Topics  . Smoking status: Current Every Day Smoker    Packs/day: 0.50    Years: 15.00    Types: Cigarettes  . Smokeless tobacco: Never Used  . Alcohol use 1.2 oz/week    2 Cans of beer per week     Comment: occ  . Drug use: No  . Sexual activity: Not Currently   Other Topics Concern  . Not on file   Social History Narrative   ** Merged History Encounter **       History reviewed. No pertinent family history.    VITAL SIGNS BP 126/74   Pulse 86   Temp (!) 97.1 F (36.2 C)   Resp 18   Ht  (1.676 m)   Wt 152 lb (68.9 kg)   LMP 03/25/2015 (Approximate)   SpO2 94%   BMI 24.53  kg/m   Patient's Medications  New Prescriptions   No medications on file  Previous Medications   AMINO ACIDS-PROTEIN HYDROLYS (FEEDING SUPPLEMENT, PRO-STAT SUGAR FREE 64,) LIQD    Take 30 mLs by mouth 2 (two) times daily.   CHOLECALCIFEROL (VITAMIN D) 1000 UNITS TABLET    Take 1,000 Units by mouth daily. Reported on 10/28/2015   DICLOFENAC SODIUM (VOLTAREN) 1 % GEL    Apply 2 g topically 4 (four) times daily. To both knees   GABAPENTIN (NEURONTIN) 300 MG CAPSULE    Take 3 capsules (900 mg total) by mouth 3 (three) times daily.   MELATONIN 3 MG TABS    Take 3 mg by mouth at bedtime as needed (sleep).    METFORMIN (GLUCOPHAGE-XR) 500 MG 24 HR TABLET    Take 1,000 mg by mouth daily with breakfast.    MIRTAZAPINE (REMERON) 7.5 MG TABLET    Take 7.5 mg by mouth at bedtime.   MULTIPLE VITAMINS-MINERALS (DECUBI-VITE) CAPS    Take 2 capsules by mouth at bedtime.   NUTRITIONAL SUPPLEMENTS (NUTRITIONAL SUPPLEMENT PO)    House Supplement - Give nutritional  treat by mouth with meals daily for caloric and weight support   NUTRITIONAL SUPPLEMENTS (NUTRITIONAL SUPPLEMENT PO)    HSG Regular Diet -  HSG Mech Soft Texture, Regular consistency, patient may have regular texture food items from [employer] menu for diet order   OLANZAPINE (ZYPREXA) 10 MG TABLET    Take 10 mg by mouth at bedtime.    POLYETHYLENE GLYCOL (MIRALAX / GLYCOLAX) PACKET    Take 17 g by mouth daily.   POTASSIUM CHLORIDE SA (K-DUR,KLOR-CON) 20 MEQ TABLET    Take 20 mEq by mouth 2 (two) times daily.    RIVAROXABAN (XARELTO) 20 MG TABS TABLET    Take 20 mg by mouth at bedtime. 20 mg one tablet by mouth daily at bedtime   SENNA (SENOKOT) 8.6 MG TABS TABLET    Take 2 tablets (17.2 mg total) by mouth daily.   THIAMINE 100 MG TABLET    Take 100 mg by mouth daily.   TORSEMIDE (DEMADEX) 20 MG TABLET    Give 1.5 tablet ( ) by mouth two times daily   TRAMADOL (ULTRAM) 50 MG TABLET    Take 1 tablet (50 mg total) by mouth 3 (three) times daily.    Modified Medications   No medications on file  Discontinued Medications   No medications on file     SIGNIFICANT DIAGNOSTIC EXAMS  PREVIOUS  10-02-14: left leg doppler: +dvt  10-08-14: bilateral hip /pelvic fracture: right hip with marked osteoarthritis  04-23-15: right distal ulna and forearm: no acute fracture   10-28-15: left lower extremity doppler: negative for dvt  08-08-16: left lower extremity doppler: + DVT  01-10-17: chest x-ray: no discernable pneumonia or congestive heart failure  01-18-17: 2-d echo: EF 55-60%; left ventricle hypertrophy; mild relaxation abnormality   TODAY:   02-28-17: KUB: constipation    LABS REVIEWED: PREVIOUS     07-27-16: hgb a1c 7.1; chol 153; ldl 66; trig 164; hdl 55  1-61-09: wbc 12.1; hgb 11.1; hct 34.8; mcv 86.6; plt 282; glucose 106; bun 14.0; creat 0.55; k+ 3.8; na++ 140; ca 10.1; liver normal albumin 4.4  01-04-17: wbc 10.7; hgb 12.3; hct 37.3; mcv 83.6; plt 227; glucose 120; bun 13.6; creat 0.58; k+ 3.7; na++ 140; ca 9.4  01-11-17: BNP 339.10  NO NEW LABS  Review of Systems  Unable to perform ROS: Dementia (unable to fully answer questions )    Physical Exam  Constitutional: No distress.  Frail   Neck: Neck supple. No thyromegaly present.  Cardiovascular: Normal rate, regular rhythm, normal heart sounds and intact distal pulses.   Pedal pulses faint   Pulmonary/Chest: Effort normal and breath sounds normal. No respiratory distress.  Abdominal: Soft. Bowel sounds are normal. She exhibits no distension. There is no tenderness.  Musculoskeletal: She exhibits edema.  Able to move all extremities  Leans to left in wheelchair Has 2+ lower extremity edema bilaterally with the left worse than right   Legs are tender to touch   Lymphadenopathy:    She has no cervical adenopathy.  Neurological: She is alert.  Skin: Skin is dry. She is not diaphoretic.  Psychiatric: She has a normal mood and affect.   ASSESSMENT/ PLAN:  TODAY    1.Chronic pain with osteoarthritis of both knees: she is NOT getting pain relief : will continue voltaren gel 2 gm to both knees four times daily; will continue neurontin  900 mg three times daily;   Will increase ultram to 50 mg every 6 hours routinely and will  monitor  2. Chronic constipation: worse; will begin miralax twice daily              MD is aware of resident's narcotic use and is in agreement with current plan of care. We will attempt to wean resident as apropriate   Synthia Innocent NP [facility]  Contact [phone] Monday through Friday 8am- 5pm  After hours call [phone]

## 2017-03-17 NOTE — Progress Notes (Signed)
This encounter was created in error - please disregard.

## 2017-03-20 ENCOUNTER — Encounter: Payer: Self-pay | Admitting: Adult Health

## 2017-03-20 ENCOUNTER — Non-Acute Institutional Stay (SKILLED_NURSING_FACILITY): Payer: Medicaid Other | Admitting: Adult Health

## 2017-03-20 DIAGNOSIS — D649 Anemia, unspecified: Secondary | ICD-10-CM

## 2017-03-20 DIAGNOSIS — R600 Localized edema: Secondary | ICD-10-CM | POA: Diagnosis not present

## 2017-03-20 DIAGNOSIS — K5909 Other constipation: Secondary | ICD-10-CM

## 2017-03-20 DIAGNOSIS — I825Y2 Chronic embolism and thrombosis of unspecified deep veins of left proximal lower extremity: Secondary | ICD-10-CM | POA: Diagnosis not present

## 2017-03-20 NOTE — Progress Notes (Signed)
Location:   [facility] Room Number: 204 B Place of Service:  SNF (31)   CODE STATUS: Full Code  No Known Allergies  Chief Complaint  Patient presents with  . Medical Management of Chronic Issues    dvt left leg; bilateral lower extremity edema; constipation and anemia.     HPI:  She is a 61 year old long term resident of this facility being seen for the management of her chronic illnesses: upper leg dvt chronic on left; chronic constipation; anemia; and bilateral lower extremity edema. She is unable to fully participate in the hpi or ros. There are no reports of behavioral issues. She is slowly losing weight; has failed remeron therapy. There are no reports of any changes in appetite. There are no nursing concerns at this time.   Past Medical History:  Diagnosis Date  . Acute deep vein thrombosis (DVT) of left femoral vein (HCC) 02/22/2015  . Alcohol abuse   . Arthritis   . Dementia with behavioral problem   . Diabetes mellitus without complication (HCC)   . Dysphagia    and aspiration risk  . 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 Seizures (HCC)     History reviewed. No pertinent surgical history.  Social History   Social History  . Marital status: Unknown    Spouse name: N/A  . Number of children: N/A  . Years of education: N/A   Occupational History  . Not on file.   Social History Main Topics  . Smoking status: Current Every Day Smoker    Packs/day: 0.50    Years: 15.00    Types: Cigarettes  . Smokeless tobacco: Never Used  . Alcohol use 1.2 oz/week    2 Cans of beer per week     Comment: occ  . Drug use: No  . Sexual activity: Not Currently   Other Topics Concern  . Not on file   Social History Narrative   ** Merged History Encounter **       History reviewed. No pertinent family history.    VITAL SIGNS BP 132/72   Pulse 68   Temp 97.6 F (36.4 C)   Resp 18   Ht 5\' 6"  (1.676 m)   Wt 141 lb 1.6 oz (64 kg)   LMP 03/25/2015  (Approximate)   SpO2 94%   BMI 22.77 kg/m   Patient's Medications  New Prescriptions   No medications on file  Previous Medications   AMINO ACIDS-PROTEIN HYDROLYS (FEEDING SUPPLEMENT, PRO-STAT SUGAR FREE 64,) LIQD    Take 30 mLs by mouth 2 (two) times daily.   CHOLECALCIFEROL (VITAMIN D) 1000 UNITS TABLET    Take 1,000 Units by mouth daily. Reported on 10/28/2015   DICLOFENAC SODIUM (VOLTAREN) 1 % GEL    Apply 2 g topically 4 (four) times daily. To both knees   GABAPENTIN (NEURONTIN) 300 MG CAPSULE    Take 3 capsules (900 mg total) by mouth 3 (three) times daily.   MELATONIN 3 MG TABS    Take 3 mg by mouth at bedtime as needed (sleep).    METFORMIN (GLUCOPHAGE-XR) 500 MG 24 HR TABLET    Take 1,000 mg by mouth daily with breakfast.    MULTIPLE VITAMINS-MINERALS (DECUBI-VITE) CAPS    Take 2 capsules by mouth at bedtime.   NUTRITIONAL SUPPLEMENTS (NUTRITIONAL SUPPLEMENT PO)    House Supplement - Give nutritional treat by mouth with meals daily for caloric and weight support   NUTRITIONAL SUPPLEMENTS (NUTRITIONAL SUPPLEMENT  PO)    HSG Regular Diet -  HSG Mech Soft Texture, Regular consistency, patient may have regular texture food items from [employer] menu for diet order   OLANZAPINE (ZYPREXA) 10 MG TABLET    Take 10 mg by mouth at bedtime.    POLYETHYLENE GLYCOL (MIRALAX / GLYCOLAX) PACKET    Take 17 g by mouth daily.   POTASSIUM CHLORIDE SA (K-DUR,KLOR-CON) 20 MEQ TABLET    Take 20 mEq by mouth 2 (two) times daily.    RIVAROXABAN (XARELTO) 20 MG TABS TABLET    Take 20 mg by mouth at bedtime. 20 mg one tablet by mouth daily at bedtime   SENNA (SENOKOT) 8.6 MG TABS TABLET    Take 2 tablets (17.2 mg total) by mouth daily.   THIAMINE 100 MG TABLET    Take 100 mg by mouth daily.   TORSEMIDE (DEMADEX) 20 MG TABLET    Give 1.5 tablet (30mg ) by mouth two times daily   TRAMADOL (ULTRAM) 50 MG TABLET    Take 50 mg by mouth every 6 (six) hours.  Modified Medications   No medications on file  Discontinued  Medications   MIRTAZAPINE (REMERON) 7.5 MG TABLET    Take 7.5 mg by mouth at bedtime.   TRAMADOL (ULTRAM) 50 MG TABLET    Take 1 tablet (50 mg total) by mouth 3 (three) times daily.     SIGNIFICANT DIAGNOSTIC EXAMS  PREVIOUS  10-02-14: left leg doppler: +dvt  10-08-14: bilateral hip /pelvic fracture: right hip with marked osteoarthritis  04-23-15: right distal ulna and forearm: no acute fracture   10-28-15: left lower extremity doppler: negative for dvt  08-08-16: left lower extremity doppler: + DVT  01-10-17: chest x-ray: no discernable pneumonia or congestive heart failure  01-18-17: 2-d echo: EF 55-60%; left ventricle hypertrophy; mild relaxation abnormality   02-28-17: KUB: constipation  NO NEW EXAMS     LABS REVIEWED: PREVIOUS     07-27-16: hgb a1c 7.1; chol 153; ldl 66; trig 164; hdl 55  1-61-09: wbc 12.1; hgb 11.1; hct 34.8; mcv 86.6; plt 282; glucose 106; bun 14.0; creat 0.55; k+ 3.8; na++ 140; ca 10.1; liver normal albumin 4.4  01-04-17: wbc 10.7; hgb 12.3; hct 37.3; mcv 83.6; plt 227; glucose 120; bun 13.6; creat 0.58; k+ 3.7; na++ 140; ca 9.4  01-11-17: BNP 339.10  TODAY:   02-16-17: tsh 1.39; vit B 12: 1092; folic 17.6    Review of Systems  Unable to perform ROS: Dementia (unable to answer questions )   Physical Exam  Constitutional: No distress.  Frail   Neck: Neck supple. No thyromegaly present.  Cardiovascular: Normal rate, regular rhythm, normal heart sounds and intact distal pulses.  Pedal pulses very faint   Pulmonary/Chest: Effort normal and breath sounds normal. No stridor. No respiratory distress.  Abdominal: Soft. Bowel sounds are normal. She exhibits no distension. There is no tenderness.  Musculoskeletal: She exhibits edema.  Able to move all extremities  Leans to left in wheelchair Has 2+ lower extremity edema bilaterally with the left worse than right   Legs are tender to touch    Lymphadenopathy:    She has no cervical adenopathy.  Neurological:  She is alert.  Skin: Skin is warm and dry. She is not diaphoretic.  Psychiatric: She has a normal mood and affect.   ASSESSMENT/ PLAN:  TODAY   1. DVT left leg:  (08-08-16; #2) stable  will continue xarelto 20 mg nightly she is on long term therapy  will monitor this at this time.      2. Bilateral lower extremity edema: no change in status:  will continue dementia  20 mg twice daily with k+ 20 meq daily   3. Constipation: stable will miralax daily and senna 2 tabs daily   4. Anemia: hgb 12.3; stable  will monitor   PREVIOUS  5. Diabetes: stable hgb a1c is 7.1  Urine for micro-albumin is 7.4;   Will continue metformin xr  1 gm daily; is not on statin her ldl is 66; is not on ace/arb   6 .Chronic pain with osteoarthritis of both knees: she is  getting adequate pain relief: will continue voltaren gel 2 gm to both knees four times daily; will continue neurontin  900 mg three times daily; and will continue ultram  50 mg every 6 hours routinely    7. Psychosis; due to alcohol: she is presently without change in status; will continue zyprexa 10 mg daily; thiamine and folic acid daily. She is not on a prn benzo at this time.   8. 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 Her current weight is 141 pounds her weight in July 2018: 154 pounds with this disease process weight loss is an expected outcome.   9. Weight loss: her current weight is 141; she has completed her remeron therapy.        MD is aware of resident's narcotic use and is in agreement with current plan of care. We will attempt to wean resident as apropriate   Synthia Innocent NP [facility]  Contact [phone] Monday through Friday 8am- 5pm  After hours call [phone]

## 2017-03-21 LAB — HEMOGLOBIN A1C: Hemoglobin A1C: 7.8

## 2017-03-28 NOTE — Progress Notes (Signed)
03/21/17

## 2017-04-17 ENCOUNTER — Other Ambulatory Visit: Payer: Self-pay

## 2017-04-17 MED ORDER — TRAMADOL HCL 50 MG PO TABS: 50.0000 mg | ORAL_TABLET | Freq: Four times a day (QID) | ORAL | 0 refills | Status: DC

## 2017-04-17 NOTE — Telephone Encounter (Signed)
RX faxed to [facility] @ [phone], phone number [phone] 

## 2017-04-18 ENCOUNTER — Non-Acute Institutional Stay (SKILLED_NURSING_FACILITY): Payer: Medicaid Other | Admitting: Adult Health

## 2017-04-18 ENCOUNTER — Encounter: Payer: Self-pay | Admitting: Adult Health

## 2017-04-18 DIAGNOSIS — E1142 Type 2 diabetes mellitus with diabetic polyneuropathy: Secondary | ICD-10-CM

## 2017-04-18 DIAGNOSIS — F1027 Alcohol dependence with alcohol-induced persisting dementia: Secondary | ICD-10-CM

## 2017-04-18 DIAGNOSIS — M170 Bilateral primary osteoarthritis of knee: Secondary | ICD-10-CM | POA: Diagnosis not present

## 2017-04-18 DIAGNOSIS — F10959 Alcohol use, unspecified with alcohol-induced psychotic disorder, unspecified: Secondary | ICD-10-CM | POA: Diagnosis not present

## 2017-04-18 NOTE — Progress Notes (Signed)
Location:   [facility] Room Number: 204 B Place of Service:  SNF (31)   CODE STATUS: Full Code  No Known Allergies  Chief Complaint  Patient presents with  . 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 She cannot fully participate in the hpi or ros. There are no reports of uncontrolled pain; no changes in appetite; no changes in weight. There are no nursing concerns at this time.   Past Medical History:  Diagnosis Date  . Acute deep vein thrombosis (DVT) of left femoral vein (HCC) 02/22/2015  . Alcohol abuse   . Arthritis   . Dementia with behavioral problem   . Diabetes mellitus without complication (HCC)   . Dysphagia    and aspiration risk  . 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 Seizures (HCC)     History reviewed. No pertinent surgical history.  Social History   Socioeconomic History  . Marital status: Unknown    Spouse name: Not on file  . Number of children: Not on file  . Years of education: Not on file  . Highest education level: Not on file  Social Needs  . Financial resource strain: Not on file  . Food insecurity - worry: Not on file  . Food insecurity - inability: Not on file  . Transportation needs - medical: Not on file  . Transportation needs - non-medical: Not on file  Occupational History  . Not on file  Tobacco Use  . Smoking status: Current Every Day Smoker    Packs/day: 0.50    Years: 15.00    Pack years: 7.50    Types: Cigarettes  . Smokeless tobacco: Never Used  Substance and Sexual Activity  . Alcohol use: Yes    Alcohol/week: 1.2 oz    Types: 2 Cans of beer per week    Comment: occ  . Drug use: No  . Sexual activity:  Not Currently  Other Topics Concern  . Not on file  Social History Narrative   ** Merged History Encounter **       History reviewed. No pertinent family history.    VITAL SIGNS BP 130/74   Pulse 84   Temp 98.3 F (36.8 C)   Resp 16   Ht 5\' 6"  (1.676 m)   Wt 145 lb 9.6 oz (66 kg)   LMP 03/25/2015 (Approximate)   SpO2 93%   BMI 23.50 kg/m   Outpatient Encounter Medications as of 04/18/2017  Medication Sig  . Amino Acids-Protein Hydrolys (FEEDING SUPPLEMENT, PRO-STAT SUGAR FREE 64,) LIQD Take 30 mLs by mouth 2 (two) times daily.  . cholecalciferol (VITAMIN D) 1000 UNITS tablet Take 1,000 Units by mouth daily. Reported on 10/28/2015  . diclofenac sodium (VOLTAREN) 1 % GEL Apply 2 g topically 4 (four) times daily. To both knees  . gabapentin (NEURONTIN) 300 MG capsule Take 3 capsules (900 mg total) by mouth 3 (three) times daily.  . Melatonin 3 MG TABS Take 3 mg by mouth at bedtime as needed (sleep).   . metFORMIN (GLUCOPHAGE-XR) 500 MG 24 hr tablet Take 1,000 mg by mouth daily with breakfast.   . Multiple Vitamins-Minerals (DECUBI-VITE) CAPS Take 2 capsules by mouth at  bedtime.  . Nutritional Supplements (NUTRITIONAL SUPPLEMENT PO) House Supplement - Give nutritional treat by mouth with meals daily for caloric and weight support  . Nutritional Supplements (NUTRITIONAL SUPPLEMENT PO) HSG Regular Diet -  HSG Mech Soft Texture, Regular consistency, patient may have regular texture food items from [employer] for diet order  . OLANZapine (ZYPREXA) 15 MG tablet Take 15 mg by mouth at bedtime.   . polyethylene glycol (MIRALAX / GLYCOLAX) packet Take 17 g by mouth 2 (two) times daily.  . potassium chloride SA (K-DUR,KLOR-CON) 20 MEQ tablet Take 20 mEq by mouth 2 (two) times daily.   . rivaroxaban (XARELTO) 20 MG TABS tablet Take 20 mg by mouth at bedtime. 20 mg one tablet by mouth daily at bedtime  . senna (SENOKOT) 8.6 MG TABS tablet Take 2 tablets (17.2 mg total) by mouth daily.  Marland Kitchen  thiamine 100 MG tablet Take 100 mg by mouth daily.  Marland Kitchen torsemide (DEMADEX) 20 MG tablet Give 1.5 tablet (30mg ) by mouth two times daily  . traMADol (ULTRAM) 50 MG tablet Take 1 tablet (50 mg total) every 6 (six) hours by mouth.  . [DISCONTINUED] polyethylene glycol (MIRALAX / GLYCOLAX) packet Take 17 g by mouth daily. (Patient not taking: Reported on 04/18/2017)   No facility-administered encounter medications on file as of 04/18/2017.      SIGNIFICANT DIAGNOSTIC EXAMS  PREVIOUS  10-02-14: left leg doppler: +dvt  10-08-14: bilateral hip /pelvic fracture: right hip with marked osteoarthritis  04-23-15: right distal ulna and forearm: no acute fracture   10-28-15: left lower extremity doppler: negative for dvt  08-08-16: left lower extremity doppler: + DVT  01-10-17: chest x-ray: no discernable pneumonia or congestive heart failure  01-18-17: 2-d echo: EF 55-60%; left ventricle hypertrophy; mild relaxation abnormality   02-28-17: KUB: constipation  NO NEW EXAMS     LABS REVIEWED: PREVIOUS     07-27-16: hgb a1c 7.1; chol 153; ldl 66; trig 164; hdl 55  1-61-09: wbc 12.1; hgb 11.1; hct 34.8; mcv 86.6; plt 282; glucose 106; bun 14.0; creat 0.55; k+ 3.8; na++ 140; ca 10.1; liver normal albumin 4.4  01-04-17: wbc 10.7; hgb 12.3; hct 37.3; mcv 83.6; plt 227; glucose 120; bun 13.6; creat 0.58; k+ 3.7; na++ 140; ca 9.4  01-11-17: BNP 339.10 02-16-17: tsh 1.39; vit B 12: 1092; folic 17.6   TODAY:   03-21-17: hgb a1c 7.8     Review of Systems  Unable to perform ROS: Dementia (confused )    Physical Exam  Constitutional: No distress.  Frail   Neck: Neck supple. No thyromegaly present.  Cardiovascular: Normal rate, regular rhythm, normal heart sounds and intact distal pulses.  Pedal pulses faint   Pulmonary/Chest: Effort normal and breath sounds normal. No respiratory distress.  Abdominal: Soft. Bowel sounds are normal. She exhibits no distension. There is no tenderness.  Musculoskeletal:  She exhibits edema.  Able to move all extremities  Leans to left in wheelchair Has 2+ lower extremity edema bilaterally with the left worse than right   Legs are tender to touch     Lymphadenopathy:    She has no cervical adenopathy.  Neurological: She is alert.  Skin: Skin is warm and dry. She is not diaphoretic.  Psychiatric: She has a normal mood and affect.   ASSESSMENT/ PLAN:  TODAY   1. Diabetes: worse hgb a1c is 7.8 (previous 7.1)  Urine for micro-albumin is 7.4;   Will continue metformin xr  1 gm daily;  Will begin  tradjenta 5 mg daily and will monitor  is not on statin her ldl is 66; is not on ace/arb   2 .Chronic pain with osteoarthritis of both knees: she is  getting adequate pain relief: will continue voltaren gel 2 gm to both knees four times daily; will continue neurontin  900 mg three times daily; and will continue ultram  50 mg every 6 hours routinely    3. Psychosis; due to alcohol: she is presently without change in status; will continue zyprexa 15 mg daily; this dose was adjusted by psych services in Oct.  thiamine and folic acid daily. She is not on a prn benzo at this time.   4. 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 Her current weight is 145 pounds her weight in July 2018: 154 pounds with this disease process weight loss is an expected outcome.    PREVIOUS  5. Weight loss: her current weight is 145; she has completed her remeron therapy.  Her weight is stable   6. DVT left leg:  (08-08-16; #2) stable  will continue xarelto 20 mg nightly she is on long term therapy  will monitor this at this time.      7. Bilateral lower extremity edema: no change in status:  will continue dementia  20 mg twice daily with k+ 20 meq daily   8. Constipation: stable will continue senna 2 tabs daily   9. Anemia: hgb 12.3; stable  will monitor   Will  check cbc; cmp lipids   MD is aware of resident's narcotic use and is in agreement with current plan of care. We will attempt to wean resident as apropriate     Synthia Innocenteborah Darrien Belter NP [facility]  Contact [phone] Monday through Friday 8am- 5pm  After hours call [phone]

## 2017-04-20 LAB — CBC AND DIFFERENTIAL
HEMATOCRIT: 36 (ref 36–46)
HEMOGLOBIN: 11.9 — AB (ref 12.0–16.0)
Neutrophils Absolute: 8
PLATELETS: 268 (ref 150–399)
WBC: 12.4

## 2017-04-20 LAB — BASIC METABOLIC PANEL
BUN: 28 — AB (ref 4–21)
CREATININE: 0.6 (ref 0.5–1.1)
Glucose: 136
Potassium: 3.3 — AB (ref 3.4–5.3)
Sodium: 138 (ref 137–147)

## 2017-04-20 LAB — HEPATIC FUNCTION PANEL
ALK PHOS: 104 (ref 25–125)
ALT: 14 (ref 7–35)
AST: 20 (ref 13–35)
Bilirubin, Total: 0.2

## 2017-04-24 NOTE — Progress Notes (Signed)
Entered in error

## 2017-04-25 ENCOUNTER — Other Ambulatory Visit: Payer: Self-pay

## 2017-04-25 ENCOUNTER — Encounter: Payer: Self-pay | Admitting: Adult Health

## 2017-04-25 ENCOUNTER — Non-Acute Institutional Stay (SKILLED_NURSING_FACILITY): Payer: Medicaid Other | Admitting: Adult Health

## 2017-04-25 DIAGNOSIS — F10959 Alcohol use, unspecified with alcohol-induced psychotic disorder, unspecified: Secondary | ICD-10-CM

## 2017-04-25 DIAGNOSIS — F1027 Alcohol dependence with alcohol-induced persisting dementia: Secondary | ICD-10-CM | POA: Diagnosis not present

## 2017-04-25 DIAGNOSIS — R600 Localized edema: Secondary | ICD-10-CM

## 2017-04-25 MED ORDER — TRAMADOL HCL 50 MG PO TABS: 50.0000 mg | ORAL_TABLET | Freq: Four times a day (QID) | ORAL | 0 refills | Status: DC

## 2017-04-25 NOTE — Progress Notes (Signed)
Location:   [facility] Room Number: 204 B Place of Service:  SNF (31)   CODE STATUS: Full Code  No Known Allergies  Chief Complaint  Patient presents with  . Acute Visit    Care Plan Meeting    HPI:  We are meeting for her routine care plan meeting. There is no family available.  She is presently not on therapy. We have reviewed her plan of care and her medication regimen. She is due for her GDR. She is currently on zyuprexa 15 mg nightly. There are no reports of any behavioral issues. We have reviewed her MOST form to ensure that one has been filled out. Staff report that today she is not herself and is less conversant.   Past Medical History:  Diagnosis Date  . Acute deep vein thrombosis (DVT) of left femoral vein (HCC) 02/22/2015  . Alcohol abuse   . Arthritis   . Dementia with behavioral problem   . Diabetes mellitus without complication (HCC)   . Dysphagia    and aspiration risk  . 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 Seizures (HCC)     History reviewed. No pertinent surgical history.  Social History   Socioeconomic History  . Marital status: Unknown    Spouse name: Not on file  . Number of children: Not on file  . Years of education: Not on file  . Highest education level: Not on file  Social Needs  . Financial resource strain: Not on file  . Food insecurity - worry: Not on file  . Food insecurity - inability: Not on file  . Transportation needs - medical: Not on file  . Transportation needs - non-medical: Not on file  Occupational History  . Not on file  Tobacco Use  . Smoking status: Current Every Day Smoker    Packs/day: 0.50    Years: 15.00    Pack years: 7.50    Types: Cigarettes  . Smokeless tobacco: Never Used  Substance and Sexual Activity  . Alcohol use: Yes    Alcohol/week: 1.2 oz    Types: 2 Cans of beer per week    Comment: occ  . Drug use: No  . Sexual activity: Not Currently  Other Topics Concern  . Not on file  Social  History Narrative  . Not on file   History reviewed. No pertinent family history.    VITAL SIGNS BP 130/88   Pulse 88   Temp (!) 97.4 F (36.3 C)   Resp 18   Ht 5\' 3"  (1.6 m)   Wt 144 lb 9.6 oz (65.6 kg)   LMP 03/25/2015 (Approximate)   SpO2 97%   BMI 25.61 kg/m   Outpatient Encounter Medications as of 04/25/2017  Medication Sig  . Amino Acids-Protein Hydrolys (FEEDING SUPPLEMENT, PRO-STAT SUGAR FREE 64,) LIQD Take 30 mLs by mouth 2 (two) times daily.  . cholecalciferol (VITAMIN D) 1000 UNITS tablet Take 1,000 Units by mouth daily. Reported on 10/28/2015  . diclofenac sodium (VOLTAREN) 1 % GEL Apply 2 g topically 4 (four) times daily. To both knees  . gabapentin (NEURONTIN) 300 MG capsule Take 3 capsules (900 mg total) by mouth 3 (three) times daily.  Marland Kitchen. linagliptin (TRADJENTA) 5 MG TABS tablet Take 5 mg by mouth daily.  . Melatonin 3 MG TABS Take 3 mg by mouth at bedtime as needed (sleep).   . metFORMIN (GLUCOPHAGE-XR) 500 MG 24 hr tablet Take 1,000 mg by mouth daily with breakfast.   .  Multiple Vitamins-Minerals (DECUBI-VITE) CAPS Take 2 capsules by mouth at bedtime.  . Nutritional Supplements (NUTRITIONAL SUPPLEMENT PO) House Supplement - Give nutritional treat by mouth with meals daily for caloric and weight support  . Nutritional Supplements (NUTRITIONAL SUPPLEMENT PO) HSG Regular Diet -  HSG Mech Soft Texture, Regular consistency, patient may have regular texture food items from [employer] for diet order  . OLANZapine (ZYPREXA) 15 MG tablet Take 15 mg by mouth at bedtime.   . polyethylene glycol (MIRALAX / GLYCOLAX) packet Take 17 g by mouth 2 (two) times daily.  . potassium chloride SA (K-DUR,KLOR-CON) 20 MEQ tablet Take 20 mEq by mouth 2 (two) times daily.   . rivaroxaban (XARELTO) 20 MG TABS tablet Take 20 mg by mouth at bedtime. 20 mg one tablet by mouth daily at bedtime  . senna (SENOKOT) 8.6 MG TABS tablet Take 2 tablets (17.2 mg total) by mouth daily.  Marland Kitchen thiamine 100  MG tablet Take 100 mg by mouth daily.  Marland Kitchen torsemide (DEMADEX) 20 MG tablet Give 2 tablets (40mg ) by mouth two times daily  . traMADol (ULTRAM) 50 MG tablet Take 1 tablet (50 mg total) every 6 (six) hours by mouth.   No facility-administered encounter medications on file as of 04/25/2017.      SIGNIFICANT DIAGNOSTIC EXAMS  PREVIOUS  10-02-14: left leg doppler: +dvt  10-08-14: bilateral hip /pelvic fracture: right hip with marked osteoarthritis  04-23-15: right distal ulna and forearm: no acute fracture   10-28-15: left lower extremity doppler: negative for dvt  08-08-16: left lower extremity doppler: + DVT  01-10-17: chest x-ray: no discernable pneumonia or congestive heart failure  01-18-17: 2-d echo: EF 55-60%; left ventricle hypertrophy; mild relaxation abnormality   02-28-17: KUB: constipation  NO NEW EXAMS     LABS REVIEWED: PREVIOUS     07-27-16: hgb a1c 7.1; chol 153; ldl 66; trig 164; hdl 55  1-61-09: wbc 12.1; hgb 11.1; hct 34.8; mcv 86.6; plt 282; glucose 106; bun 14.0; creat 0.55; k+ 3.8; na++ 140; ca 10.1; liver normal albumin 4.4  01-04-17: wbc 10.7; hgb 12.3; hct 37.3; mcv 83.6; plt 227; glucose 120; bun 13.6; creat 0.58; k+ 3.7; na++ 140; ca 9.4  01-11-17: BNP 339.10 02-16-17: tsh 1.39; vit B 12: 1092; folic 17.6  03-21-17: hgb a1c 7.8   NO NEW LABS.     Review of Systems  Unable to perform ROS: Dementia (unable to participate )    Physical Exam  Constitutional: No distress.  Frail   Neck: Neck supple. No thyromegaly present.  Cardiovascular: Normal rate, regular rhythm, normal heart sounds and intact distal pulses.  Pedal pulses faint   Pulmonary/Chest: Effort normal and breath sounds normal. No respiratory distress.  Abdominal: Soft. Bowel sounds are normal. She exhibits no distension. There is no tenderness.  Musculoskeletal: She exhibits edema.  Able to move all extremities  Leans to left in wheelchair Has 2+ lower extremity edema bilaterally with the left  worse than right   Legs are tender to touch      Lymphadenopathy:    She has no cervical adenopathy.  Neurological: She is alert.  Skin: Skin is warm and dry. She is not diaphoretic.  Psychiatric: She has a normal mood and affect.    ASSESSMENT/ PLAN:  TODAY   1. 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 Bilateral lower extremity edema  Will continue her current plan of care; will not make changes; no changes to MOST form Will lower  her zyprexa to 10 mg nightly and will monitor    MD is aware of resident's narcotic use and is in agreement with current plan of care. We will attempt to wean resident as apropriate   Synthia Innocenteborah Green NP [facility]  Contact [phone] Monday through Friday 8am- 5pm  After hours call [phone]

## 2017-04-25 NOTE — Telephone Encounter (Signed)
RX faxed to [facility] @ [phone], phone number [phone] 

## 2017-04-26 ENCOUNTER — Non-Acute Institutional Stay (SKILLED_NURSING_FACILITY): Payer: Medicaid Other | Admitting: Adult Health

## 2017-04-26 ENCOUNTER — Encounter (HOSPITAL_COMMUNITY): Payer: Self-pay | Admitting: Emergency Medicine

## 2017-04-26 ENCOUNTER — Encounter: Payer: Self-pay | Admitting: Adult Health

## 2017-04-26 ENCOUNTER — Emergency Department (HOSPITAL_COMMUNITY): Payer: Medicaid Other

## 2017-04-26 ENCOUNTER — Inpatient Hospital Stay (HOSPITAL_COMMUNITY)
Admission: EM | Admit: 2017-04-26 | Discharge: 2017-04-30 | DRG: 871 | Disposition: A | Discharge status: Skilled Nursing Facility | Payer: Medicaid Other | Attending: Internal Medicine | Admitting: Internal Medicine

## 2017-04-26 ENCOUNTER — Inpatient Hospital Stay (HOSPITAL_COMMUNITY): Payer: Medicaid Other

## 2017-04-26 DIAGNOSIS — E876 Hypokalemia: Secondary | ICD-10-CM | POA: Diagnosis present

## 2017-04-26 DIAGNOSIS — E1149 Type 2 diabetes mellitus with other diabetic neurological complication: Secondary | ICD-10-CM | POA: Diagnosis present

## 2017-04-26 DIAGNOSIS — A4151 Sepsis due to Escherichia coli [E. coli]: Secondary | ICD-10-CM | POA: Diagnosis not present

## 2017-04-26 DIAGNOSIS — G9340 Encephalopathy, unspecified: Secondary | ICD-10-CM

## 2017-04-26 DIAGNOSIS — Z7901 Long term (current) use of anticoagulants: Secondary | ICD-10-CM | POA: Diagnosis not present

## 2017-04-26 DIAGNOSIS — R310 Gross hematuria: Secondary | ICD-10-CM | POA: Diagnosis present

## 2017-04-26 DIAGNOSIS — F0150 Vascular dementia without behavioral disturbance: Secondary | ICD-10-CM | POA: Diagnosis present

## 2017-04-26 DIAGNOSIS — F17210 Nicotine dependence, cigarettes, uncomplicated: Secondary | ICD-10-CM | POA: Diagnosis present

## 2017-04-26 DIAGNOSIS — M170 Bilateral primary osteoarthritis of knee: Secondary | ICD-10-CM

## 2017-04-26 DIAGNOSIS — F10159 Alcohol abuse with alcohol-induced psychotic disorder, unspecified: Secondary | ICD-10-CM | POA: Diagnosis present

## 2017-04-26 DIAGNOSIS — Z7984 Long term (current) use of oral hypoglycemic drugs: Secondary | ICD-10-CM

## 2017-04-26 DIAGNOSIS — R319 Hematuria, unspecified: Secondary | ICD-10-CM | POA: Diagnosis not present

## 2017-04-26 DIAGNOSIS — R140 Abdominal distension (gaseous): Secondary | ICD-10-CM | POA: Diagnosis present

## 2017-04-26 DIAGNOSIS — E872 Acidosis: Secondary | ICD-10-CM | POA: Diagnosis present

## 2017-04-26 DIAGNOSIS — I82409 Acute embolism and thrombosis of unspecified deep veins of unspecified lower extremity: Secondary | ICD-10-CM

## 2017-04-26 DIAGNOSIS — J690 Pneumonitis due to inhalation of food and vomit: Secondary | ICD-10-CM | POA: Diagnosis present

## 2017-04-26 DIAGNOSIS — Z79899 Other long term (current) drug therapy: Secondary | ICD-10-CM

## 2017-04-26 DIAGNOSIS — G9341 Metabolic encephalopathy: Secondary | ICD-10-CM | POA: Diagnosis present

## 2017-04-26 DIAGNOSIS — K5909 Other constipation: Secondary | ICD-10-CM

## 2017-04-26 DIAGNOSIS — E1142 Type 2 diabetes mellitus with diabetic polyneuropathy: Secondary | ICD-10-CM | POA: Diagnosis not present

## 2017-04-26 DIAGNOSIS — B370 Candidal stomatitis: Secondary | ICD-10-CM | POA: Diagnosis present

## 2017-04-26 DIAGNOSIS — F1010 Alcohol abuse, uncomplicated: Secondary | ICD-10-CM | POA: Diagnosis present

## 2017-04-26 DIAGNOSIS — N390 Urinary tract infection, site not specified: Secondary | ICD-10-CM | POA: Diagnosis present

## 2017-04-26 DIAGNOSIS — D649 Anemia, unspecified: Secondary | ICD-10-CM | POA: Diagnosis present

## 2017-04-26 DIAGNOSIS — F209 Schizophrenia, unspecified: Secondary | ICD-10-CM | POA: Diagnosis present

## 2017-04-26 DIAGNOSIS — I825Y2 Chronic embolism and thrombosis of unspecified deep veins of left proximal lower extremity: Secondary | ICD-10-CM | POA: Diagnosis not present

## 2017-04-26 DIAGNOSIS — N3000 Acute cystitis without hematuria: Secondary | ICD-10-CM

## 2017-04-26 DIAGNOSIS — A419 Sepsis, unspecified organism: Principal | ICD-10-CM

## 2017-04-26 DIAGNOSIS — K5641 Fecal impaction: Secondary | ICD-10-CM | POA: Diagnosis present

## 2017-04-26 DIAGNOSIS — I5042 Chronic combined systolic (congestive) and diastolic (congestive) heart failure: Secondary | ICD-10-CM | POA: Diagnosis not present

## 2017-04-26 DIAGNOSIS — G8929 Other chronic pain: Secondary | ICD-10-CM

## 2017-04-26 DIAGNOSIS — I509 Heart failure, unspecified: Secondary | ICD-10-CM | POA: Diagnosis present

## 2017-04-26 DIAGNOSIS — F10959 Alcohol use, unspecified with alcohol-induced psychotic disorder, unspecified: Secondary | ICD-10-CM | POA: Diagnosis not present

## 2017-04-26 DIAGNOSIS — I5022 Chronic systolic (congestive) heart failure: Secondary | ICD-10-CM | POA: Diagnosis not present

## 2017-04-26 DIAGNOSIS — L8990 Pressure ulcer of unspecified site, unspecified stage: Secondary | ICD-10-CM

## 2017-04-26 LAB — COMPREHENSIVE METABOLIC PANEL
ALK PHOS: 84 U/L (ref 38–126)
ALT: 24 U/L (ref 14–54)
AST: 36 U/L (ref 15–41)
Albumin: 3.7 g/dL (ref 3.5–5.0)
Anion gap: 16 — ABNORMAL HIGH (ref 5–15)
BUN: 24 mg/dL — AB (ref 6–20)
CALCIUM: 9.6 mg/dL (ref 8.9–10.3)
CHLORIDE: 96 mmol/L — AB (ref 101–111)
CO2: 27 mmol/L (ref 22–32)
CREATININE: 0.99 mg/dL (ref 0.44–1.00)
GFR calc Af Amer: >60 mL/min (ref >60)
Glucose, Bld: 149 mg/dL — ABNORMAL HIGH (ref 65–99)
Potassium: 3.3 mmol/L — ABNORMAL LOW (ref 3.5–5.1)
Sodium: 139 mmol/L (ref 135–145)
Total Bilirubin: 0.5 mg/dL (ref 0.3–1.2)
Total Protein: 8.8 g/dL — ABNORMAL HIGH (ref 6.5–8.1)

## 2017-04-26 LAB — APTT: aPTT: 30 seconds (ref 24–36)

## 2017-04-26 LAB — HEPATIC FUNCTION PANEL
ALT: 23 (ref 7–35)
AST: 22 (ref 13–35)
Alkaline Phosphatase: 92 (ref 25–125)
Bilirubin, Total: 0.4

## 2017-04-26 LAB — URINALYSIS, ROUTINE W REFLEX MICROSCOPIC
BILIRUBIN URINE: NEGATIVE
GLUCOSE, UA: NEGATIVE mg/dL
Ketones, ur: NEGATIVE mg/dL
NITRITE: NEGATIVE
PH: 5 (ref 5.0–8.0)
Protein, ur: NEGATIVE mg/dL
SPECIFIC GRAVITY, URINE: 1.012 (ref 1.005–1.030)

## 2017-04-26 LAB — BRAIN NATRIURETIC PEPTIDE: B NATRIURETIC PEPTIDE 5: 52.2 pg/mL (ref 0.0–100.0)

## 2017-04-26 LAB — AMMONIA: AMMONIA: 18 umol/L (ref 9–35)

## 2017-04-26 LAB — CBC
HEMATOCRIT: 35.9 % — AB (ref 36.0–46.0)
HEMOGLOBIN: 11.9 g/dL — AB (ref 12.0–15.0)
MCH: 28.6 pg (ref 26.0–34.0)
MCHC: 33.1 g/dL (ref 30.0–36.0)
MCV: 86.3 fL (ref 78.0–100.0)
Platelets: 297 10*3/uL (ref 150–400)
RBC: 4.16 MIL/uL (ref 3.87–5.11)
RDW: 15.4 % (ref 11.5–15.5)
WBC: 18.5 10*3/uL — ABNORMAL HIGH (ref 4.0–10.5)

## 2017-04-26 LAB — CBC AND DIFFERENTIAL
HEMATOCRIT: 39 (ref 36–46)
HEMOGLOBIN: 12.4 (ref 12.0–16.0)
Neutrophils Absolute: 12
Platelets: 290 (ref 150–399)
WBC: 16.9

## 2017-04-26 LAB — BASIC METABOLIC PANEL
BUN: 22 — AB (ref 4–21)
Creatinine: 0.8 (ref 0.5–1.1)
Glucose: 131
Potassium: 3.8 (ref 3.4–5.3)
Sodium: 144 (ref 137–147)

## 2017-04-26 LAB — HEPARIN LEVEL (UNFRACTIONATED): HEPARIN UNFRACTIONATED: 0.26 [IU]/mL — AB (ref 0.30–0.70)

## 2017-04-26 LAB — PROTIME-INR
INR: 1.11
PROTHROMBIN TIME: 14.2 s (ref 11.4–15.2)

## 2017-04-26 LAB — CBG MONITORING, ED: Glucose-Capillary: 144 mg/dL — ABNORMAL HIGH (ref 65–99)

## 2017-04-26 LAB — I-STAT CG4 LACTIC ACID, ED: Lactic Acid, Venous: 5.14 mmol/L (ref 0.5–1.9)

## 2017-04-26 LAB — MAGNESIUM: MAGNESIUM: 1.7 mg/dL (ref 1.7–2.4)

## 2017-04-26 MED ORDER — LORAZEPAM 2 MG/ML IJ SOLN
1.0000 mg | INTRAMUSCULAR | Status: DC | PRN
  Administered 2017-04-26 – 2017-04-28 (×2): 1 mg via INTRAVENOUS
  Filled 2017-04-26 (×3): qty 1

## 2017-04-26 MED ORDER — MORPHINE SULFATE (PF) 2 MG/ML IV SOLN
2.0000 mg | Freq: Once | INTRAVENOUS | Status: AC
  Administered 2017-04-26: 2 mg via INTRAVENOUS
  Filled 2017-04-26: qty 1

## 2017-04-26 MED ORDER — NICOTINE 21 MG/24HR TD PT24
21.0000 mg | MEDICATED_PATCH | Freq: Every day | TRANSDERMAL | Status: DC
  Administered 2017-04-27 – 2017-04-30 (×4): 21 mg via TRANSDERMAL
  Filled 2017-04-26 (×4): qty 1

## 2017-04-26 MED ORDER — VANCOMYCIN HCL IN DEXTROSE 1-5 GM/200ML-% IV SOLN: 1000.0000 mg | INTRAVENOUS | Status: DC

## 2017-04-26 MED ORDER — HEPARIN (PORCINE) IN NACL 100-0.45 UNIT/ML-% IJ SOLN
1250.0000 [IU]/h | INTRAMUSCULAR | Status: DC
  Administered 2017-04-26: 900 [IU]/h via INTRAVENOUS
  Administered 2017-04-27: 1250 [IU]/h via INTRAVENOUS
  Filled 2017-04-26 (×2): qty 250

## 2017-04-26 MED ORDER — SODIUM CHLORIDE 0.9 % IV BOLUS (SEPSIS)
1000.0000 mL | Freq: Once | INTRAVENOUS | Status: AC
  Administered 2017-04-26: 1000 mL via INTRAVENOUS

## 2017-04-26 MED ORDER — PIPERACILLIN-TAZOBACTAM 3.375 G IVPB
3.3750 g | Freq: Three times a day (TID) | INTRAVENOUS | Status: DC
Start: 2017-04-27 — End: 2017-04-27
  Administered 2017-04-27 (×2): 3.375 g via INTRAVENOUS
  Filled 2017-04-26 (×3): qty 50

## 2017-04-26 MED ORDER — POTASSIUM CHLORIDE 10 MEQ/100ML IV SOLN
10.0000 meq | INTRAVENOUS | Status: AC
  Administered 2017-04-26 – 2017-04-27 (×3): 10 meq via INTRAVENOUS
  Filled 2017-04-26 (×3): qty 100

## 2017-04-26 MED ORDER — SODIUM CHLORIDE 0.9 % IV SOLN
INTRAVENOUS | Status: DC
  Administered 2017-04-26 – 2017-04-28 (×2): via INTRAVENOUS

## 2017-04-26 MED ORDER — IOPAMIDOL (ISOVUE-300) INJECTION 61%
100.0000 mL | Freq: Once | INTRAVENOUS | Status: AC | PRN
  Administered 2017-04-26: 100 mL via INTRAVENOUS

## 2017-04-26 MED ORDER — VANCOMYCIN HCL IN DEXTROSE 1-5 GM/200ML-% IV SOLN
1000.0000 mg | Freq: Once | INTRAVENOUS | Status: AC
  Administered 2017-04-26: 1000 mg via INTRAVENOUS
  Filled 2017-04-26: qty 200

## 2017-04-26 MED ORDER — PIPERACILLIN-TAZOBACTAM 3.375 G IVPB 30 MIN
3.3750 g | Freq: Once | INTRAVENOUS | Status: AC
  Administered 2017-04-26: 3.375 g via INTRAVENOUS
  Filled 2017-04-26: qty 50

## 2017-04-26 MED ORDER — ACETAMINOPHEN 650 MG RE SUPP: 650.0000 mg | Freq: Four times a day (QID) | RECTAL | Status: DC | PRN

## 2017-04-26 MED ORDER — IOPAMIDOL (ISOVUE-300) INJECTION 61%
INTRAVENOUS | Status: AC
  Filled 2017-04-26: qty 100

## 2017-04-26 MED ORDER — MORPHINE SULFATE (PF) 2 MG/ML IV SOLN
1.0000 mg | INTRAVENOUS | Status: DC | PRN
  Administered 2017-04-26: 1 mg via INTRAVENOUS
  Filled 2017-04-26: qty 1

## 2017-04-26 MED ORDER — ONDANSETRON HCL 4 MG/2ML IJ SOLN
4.0000 mg | Freq: Three times a day (TID) | INTRAMUSCULAR | Status: DC | PRN
  Administered 2017-04-29: 4 mg via INTRAVENOUS
  Filled 2017-04-26: qty 2

## 2017-04-26 MED ORDER — LORAZEPAM 2 MG/ML IJ SOLN
0.5000 mg | Freq: Four times a day (QID) | INTRAMUSCULAR | Status: DC | PRN
  Administered 2017-04-27 – 2017-04-29 (×4): 0.5 mg via INTRAVENOUS
  Filled 2017-04-26 (×4): qty 1

## 2017-04-26 NOTE — ED Notes (Signed)
Notified Freida BusmanAllen, MD of I-stat lactic results @ 2000.

## 2017-04-26 NOTE — ED Provider Notes (Signed)
[facility]-EMERGENCY DEPT Provider Note   CSN: [id number] Arrival date & time: 04/26/17  1859     History   Chief Complaint Chief Complaint  Patient presents with  . Fatigue    HPI Sarah Mckay is a 61 y.o. female.  61 year old female from nursing home with history of alcohol abuse, dementia and psychosis presents with lethargy from her nursing home.  Patient noted to be more lethargic since early this afternoon.  EMS was called and the history is limited due to staff not being familiar with the patient as well as her current state.  According to the records, patient was seen yesterday and today by her primary care physician and there has been discussion about managing her narcotic dependence.  No other history obtainable due to current condition      Past Medical History:  Diagnosis Date  . Acute deep vein thrombosis (DVT) of left femoral vein (HCC) 02/22/2015  . Alcohol abuse   . Arthritis   . Dementia with behavioral problem   . Diabetes mellitus without complication (HCC)   . Dysphagia    and aspiration risk  . 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 Seizures [facility])     Patient Active Problem List   Diagnosis Date Noted  . Weight loss, abnormal 02/25/2017  . Upper leg DVT (deep venous thromboembolism), chronic, left (HCC) 10/19/2016  . 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 Bilateral lower extremity edema 12/21/2015  . Anxiety 09/16/2015  . Chronic constipation 08/26/2015  . Anemia 08/26/2015  . Polyneuropathy due to type 2 diabetes mellitus (HCC) 03/25/2015  . Diabetes mellitus with neurological manifestations (HCC) 10/22/2014  . Primary osteoarthritis of both knees 06/03/2014  . Chronic pain 05/13/2014  . Alcohol abuse   . Dysphagia   . 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  OB History    No data available       Home Medications    Prior to Admission medications   Medication Sig Start Date  End Date Taking? Authorizing Provider  Amino Acids-Protein Hydrolys (FEEDING SUPPLEMENT, PRO-STAT SUGAR FREE 64,) LIQD Take 30 mLs by mouth 2 (two) times daily.    [provider]  cholecalciferol (VITAMIN D) 1000 UNITS tablet Take 1,000 Units by mouth daily. Reported on 10/28/2015    [provider]  diclofenac sodium (VOLTAREN) 1 % GEL Apply 2 g topically 4 (four) times daily. To both knees 06/03/14   Sharee Holster, NP  gabapentin (NEURONTIN) 300 MG capsule Take 3 capsules (900 mg total) by mouth 3 (three) times daily. 06/30/14   Sharee Holster, NP  linagliptin (TRADJENTA) 5 MG TABS tablet Take 5 mg by mouth daily.    [provider]  Melatonin 3 MG TABS Take 3 mg by mouth at bedtime as needed (sleep).     [provider]  metFORMIN (GLUCOPHAGE-XR) 500 MG 24 hr tablet Take 1,000 mg by mouth daily with breakfast.     [provider]  Multiple Vitamins-Minerals (DECUBI-VITE) CAPS Take 2 capsules by mouth at bedtime.    [provider]  Nutritional Supplements (NUTRITIONAL SUPPLEMENT PO) House Supplement - Give nutritional treat by mouth with meals daily for caloric and weight support    [provider]  Nutritional Supplements (NUTRITIONAL SUPPLEMENT PO) HSG Regular Diet -  HSG Mech Soft Texture, Regular consistency, patient may have regular texture food items from [employer] for diet order  [provider]  OLANZapine (ZYPREXA) 15 MG tablet Take 15 mg by mouth at bedtime.  03/21/17   [provider]  polyethylene glycol (MIRALAX / GLYCOLAX) packet Take 17 g by mouth 2 (two) times daily.    [provider]  potassium chloride SA (K-DUR,KLOR-CON) 20 MEQ tablet Take 20 mEq by mouth 2 (two) times daily.     [provider]  rivaroxaban (XARELTO) 20 MG TABS tablet Take 20 mg by mouth at bedtime. 20 mg one tablet by mouth daily at bedtime    [provider]  senna (SENOKOT) 8.6 MG TABS tablet Take  2 tablets (17.2 mg total) by mouth daily. 08/28/15   Clydia LlanoElmahi, Mutaz, MD  thiamine 100 MG tablet Take 100 mg by mouth daily.    [provider]  torsemide (DEMADEX) 20 MG tablet Give 2 tablets (40mg ) by mouth two times daily 04/14/17   [provider]  traMADol (ULTRAM) 50 MG tablet Take 1 tablet (50 mg total) by mouth every 6 (six) hours. 04/25/17   Sharee HolsterGreen, Deborah S, NP    Family History No family history on file.  Social History Social History   Tobacco Use  . Smoking status: Current Every Day Smoker    Packs/day: 0.50    Years: 15.00    Pack years: 7.50    Types: Cigarettes  . Smokeless tobacco: Never Used  Substance Use Topics  . Alcohol use: Yes    Alcohol/week: 1.2 oz    Types: 2 Cans of beer per week    Comment: occ  . Drug use: No     Allergies   Patient has no known allergies.   Review of Systems Review of Systems  Unable to perform ROS: Dementia     Physical Exam Updated Vital Signs BP 131/81   Pulse (!) 125   Temp 98.9 F (37.2 C) (Oral)   Resp (!) 46   LMP 03/25/2015 (Approximate)   SpO2 100%   Physical Exam  Constitutional: She appears well-developed and well-nourished. She appears lethargic.  Non-toxic appearance. No distress.  HENT:  Head: Normocephalic and atraumatic.  Eyes: Conjunctivae, EOM and lids are normal. Pupils are equal, round, and reactive to light.  Neck: Normal range of motion. Neck supple. No tracheal deviation present. No thyroid mass present.  Cardiovascular: Regular rhythm and normal heart sounds. Tachycardia present. Exam reveals no gallop.  No murmur heard. Pulmonary/Chest: Effort normal and breath sounds normal. No stridor. No respiratory distress. She has no decreased breath sounds. She has no wheezes. She has no rhonchi. She has no rales.  Abdominal: Soft. Normal appearance and bowel sounds are normal. She exhibits no distension. There is no tenderness. There is no rebound and no CVA tenderness.    Musculoskeletal: Normal range of motion. She exhibits no edema or tenderness.  Neurological: She appears lethargic. She displays atrophy. No cranial nerve deficit or sensory deficit. GCS eye subscore is 4. GCS verbal subscore is 4. GCS motor subscore is 6.  Skin: Skin is warm and dry. No abrasion and no rash noted.  Psychiatric: Her speech is rapid and/or pressured.  Nursing note and vitals reviewed.    ED Treatments / Results  Labs (all labs ordered are listed, but only abnormal results are displayed) Labs Reviewed  CULTURE, BLOOD (ROUTINE X 2)  CULTURE, BLOOD (ROUTINE X 2)  CBC  URINALYSIS, ROUTINE W REFLEX MICROSCOPIC  AMMONIA  COMPREHENSIVE METABOLIC PANEL  PROTIME-INR  APTT  CBG MONITORING, ED  I-STAT CG4 LACTIC  ACID, ED    EKG  EKG Interpretation None       Radiology No results found.  Procedures Procedures (including critical care time)  Medications Ordered in ED Medications - No data to display   Initial Impression / Assessment and Plan / ED Course  I have reviewed the triage vital signs and the nursing notes.  Pertinent labs & imaging results that were available during my care of the patient were reviewed by me and considered in my medical decision making (see chart for details).     Level 2 sepsis called and pt given iv fluids and broad spectrum abx  UA w/ infection Will be admitted by triad Final Clinical Impressions(s) / ED Diagnoses   Final diagnoses:  None    ED Discharge Orders    None       Lorre NickAllen, Isabella Ida, MD 04/26/17 2143

## 2017-04-26 NOTE — ED Notes (Signed)
Bed: WA13 Expected date:  Expected time:  Means of arrival:  Comments: 

## 2017-04-26 NOTE — ED Notes (Signed)
Pt became very anxious and c/o pain in both her legs. PRN meds given. Pt much more calm at this time.

## 2017-04-26 NOTE — Progress Notes (Signed)
Pharmacy Antibiotic and Anticoagulation note Note  Sarah Mckay is a 61 y.o. female admitted on 04/26/2017 with sepsis.  Pharmacy has been consulted for zosyn and vancomycin and IV heparin dosing.   Plan: ZEI Vancomycin 1 Gm IV q24h for est AUC = 514 Goal AUC=400-500 Daily Scr F/u cultures/levels Baseline HL (pt on xarelto PTA) Start heparin drip at 900 units/hr (no bolus) Check aptt and HL in 6 hours Daily CBC/HL      Temp (24hrs), Avg:98.8 F (37.1 C), Min:97.4 F (36.3 C), Max:99.4 F (37.4 C)  Recent Labs  Lab 04/20/17 04/26/17 1937 04/26/17 1957  WBC 12.4 18.5*  --   CREATININE 0.6 0.99  --   LATICACIDVEN  --   --  5.14*    Estimated Creatinine Clearance: 55.3 mL/min (by C-G formula based on SCr of 0.99 mg/dL).    No Known Allergies  Antimicrobials this admission: 11/29 zosyn >>  11/29 vancomycin >>   Dose adjustments this admission:   Microbiology results:  BCx:   UCx:    Sputum:    MRSA PCR:   Thank you for allowing pharmacy to be a part of this patient's care.  [name], Sarah Mckay 04/26/2017 10:39 PM

## 2017-04-26 NOTE — ED Notes (Signed)
Handoff time@0005 

## 2017-04-26 NOTE — Progress Notes (Signed)
A consult was received from an ED physician for Vanc and Zosyn per pharmacy dosing.  The patient's profile has been reviewed for ht/wt/allergies/indication/available labs. A one time order has been placed for the above antibiotics.  Further antibiotics/pharmacy consults should be ordered by admitting physician if indicated.                       Thank you, Bernadene Personrew [place], PharmD, BCPS Pager: [phone] 04/26/2017, 8:08 PM

## 2017-04-26 NOTE — ED Triage Notes (Signed)
Patient arrives by EMS from [facility] with complaints of lethargy. Early in the afternoon-no time-"she slumped to one side". EMS reports the staff did not know what time or if this is her normal-patient has severe dementia and schizophrenia. Abdomen with significant ascites.

## 2017-04-26 NOTE — Progress Notes (Signed)
Location:   [facility] Room Number: 204 B Place of Service:  SNF (31)   CODE STATUS: Full code  No Known Allergies  Chief Complaint  Patient presents with  . Acute Visit    Change in status    HPI:  Staff reports that she had a bad day yesterday; not her usual talkative self; more lethargic and is not eating. She is difficult to arouse. Her abdomen is distended. There are no reports of fever present. She had not eaten or drank today. There is concerns from the nursing staff that she is getting sick.   Past Medical History:  Diagnosis Date  . Acute deep vein thrombosis (DVT) of left femoral vein (HCC) 02/22/2015  . Alcohol abuse   . Arthritis   . Dementia with behavioral problem   . Diabetes mellitus without complication (HCC)   . Dysphagia    and aspiration risk  . 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 Seizures (HCC)     History reviewed. No pertinent surgical history.  Social History   Socioeconomic History  . Marital status: Unknown    Spouse name: Not on file  . Number of children: Not on file  . Years of education: Not on file  . Highest education level: Not on file  Social Needs  . Financial resource strain: Not on file  . Food insecurity - worry: Not on file  . Food insecurity - inability: Not on file  . Transportation needs - medical: Not on file  . Transportation needs - non-medical: Not on file  Occupational History  . Not on file  Tobacco Use  . Smoking status: Current Every Day Smoker    Packs/day: 0.50    Years: 15.00    Pack years: 7.50    Types: Cigarettes  . Smokeless tobacco: Never Used  Substance and Sexual Activity  . Alcohol use: Yes    Alcohol/week: 1.2 oz    Types: 2 Cans of beer per week    Comment: occ  . Drug use: No  . Sexual activity: Not Currently  Other Topics Concern  . Not on file  Social History Narrative  . Not on file   History reviewed. No pertinent family history.    VITAL SIGNS BP 130/88   Pulse  (!) 130   Temp (!) 97.4 F (36.3 C)   Resp 18   Ht 5\' 3"  (1.6 m)   Wt 141 lb 14.4 oz (64.4 kg)   LMP 03/25/2015 (Approximate)   SpO2 97%   BMI 25.14 kg/m    Outpatient Encounter Medications as of 04/26/2017  Medication Sig  . Amino Acids-Protein Hydrolys (FEEDING SUPPLEMENT, PRO-STAT SUGAR FREE 64,) LIQD Take 30 mLs by mouth 2 (two) times daily.  . cholecalciferol (VITAMIN D) 1000 UNITS tablet Take 1,000 Units by mouth daily. Reported on 10/28/2015  . diclofenac sodium (VOLTAREN) 1 % GEL Apply 2 g topically 4 (four) times daily. To both knees  . gabapentin (NEURONTIN) 300 MG capsule Take 3 capsules (900 mg total) by mouth 3 (three) times daily.  Marland Kitchen linagliptin (TRADJENTA) 5 MG TABS tablet Take 5 mg by mouth daily.  . Melatonin 3 MG TABS Take 3 mg by mouth at bedtime as needed (sleep).   . metFORMIN (GLUCOPHAGE-XR) 500 MG 24 hr tablet Take 1,000 mg by mouth daily with breakfast.   . Multiple Vitamins-Minerals (DECUBI-VITE) CAPS Take 2 capsules by mouth at bedtime.  . Nutritional Supplements (NUTRITIONAL SUPPLEMENT PO) House Supplement - Give  nutritional treat by mouth with meals daily for caloric and weight support  . Nutritional Supplements (NUTRITIONAL SUPPLEMENT PO) HSG Regular Diet -  HSG Mech Soft Texture, Regular consistency, patient may have regular texture food items from [employer] menu for diet order  . OLANZapine (ZYPREXA) 15 MG tablet Take 15 mg by mouth at bedtime.   . polyethylene glycol (MIRALAX / GLYCOLAX) packet Take 17 g by mouth 2 (two) times daily.  . potassium chloride SA (K-DUR,KLOR-CON) 20 MEQ tablet Take 20 mEq by mouth 2 (two) times daily.   . rivaroxaban (XARELTO) 20 MG TABS tablet Take 20 mg by mouth at bedtime. 20 mg one tablet by mouth daily at bedtime  . senna (SENOKOT) 8.6 MG TABS tablet Take 2 tablets (17.2 mg total) by mouth daily.  Marland Kitchen. thiamine 100 MG tablet Take 100 mg by mouth daily.  Marland Kitchen. torsemide (DEMADEX) 20 MG tablet Give 2 tablets (40mg ) by mouth two times  daily  . traMADol (ULTRAM) 50 MG tablet Take 1 tablet (50 mg total) by mouth every 6 (six) hours.   No facility-administered encounter medications on file as of 04/26/2017.      SIGNIFICANT DIAGNOSTIC EXAMS   PREVIOUS  10-02-14: left leg doppler: +dvt  10-08-14: bilateral hip /pelvic fracture: right hip with marked osteoarthritis  04-23-15: right distal ulna and forearm: no acute fracture   10-28-15: left lower extremity doppler: negative for dvt  08-08-16: left lower extremity doppler: + DVT  01-10-17: chest x-ray: no discernable pneumonia or congestive heart failure  01-18-17: 2-d echo: EF 55-60%; left ventricle hypertrophy; mild relaxation abnormality   02-28-17: KUB: constipation  NO NEW EXAMS     LABS REVIEWED: PREVIOUS     07-27-16: hgb a1c 7.1; chol 153; ldl 66; trig 164; hdl 55  [id number]: wbc 12.1; hgb 11.1; hct 34.8; mcv 86.6; plt 282; glucose 106; bun 14.0; creat 0.55; k+ 3.8; na++ 140; ca 10.1; liver normal albumin 4.4  01-04-17: wbc 10.7; hgb 12.3; hct 37.3; mcv 83.6; plt 227; glucose 120; bun 13.6; creat 0.58; k+ 3.7; na++ 140; ca 9.4  01-11-17: BNP 339.10 02-16-17: tsh 1.39; vit B 12: 1092; folic 17.6  03-21-17: hgb a1c 7.8   NO NEW LABS.     Review of Systems  Unable to perform ROS: Dementia (not verbally responsive )   Physical Exam  Constitutional: She appears distressed.  Frail   Neck: Neck supple. No thyromegaly present.  Cardiovascular: Regular rhythm, normal heart sounds and intact distal pulses.  Is tachycardic  Pedal pulses faint   Pulmonary/Chest: Effort normal and breath sounds normal. No respiratory distress.  Abdominal: She exhibits distension. There is no tenderness. There is no guarding.  Her abdomen is distended firm with tympanic to percussion; bowel sounds hypoactive Rectal exam with significant amount of hard stool present   Musculoskeletal: She exhibits edema.  Able to move all extremities  Leans to left in wheelchair Has 2+ lower  extremity edema bilaterally with the left worse than right   Legs are tender to touch      Neurological:  Is aware   Skin: Skin is warm and dry. She is not diaphoretic.    ASSESSMENT/ PLAN:  TODAY:   1. Severe constipation 2. Acute encephalopathy  Will get stat chest x-ray; kub; cbc; cmp Will give dulcolax supp now if no results in one hour will give fleets enema Will begin NS at 75 cc per hour for 3 liters and will monitor her status.   Time spent with  patient: 45 minutes    MD is aware of resident's narcotic use and is in agreement with current plan of care. We will attempt to wean resident as apropriate   Synthia Innocenteborah Green NP [facility]  Contact [phone] Monday through Friday 8am- 5pm  After hours call [phone]

## 2017-04-26 NOTE — H&P (Signed)
History and Physical    Norlene Lanes ZOX:[id number] DOB: 01/16/1956 DOA: 04/26/2017  Referring MD/NP/PA:   PCP: Kirt Boys, DO   Patient coming from:  The patient is coming from SNF.  At baseline, pt is dependent for most of ADL.  Chief Complaint: AMS and abdominal distention  HPI: Sarah Mckay is a 61 y.o. female with medical history significant of diabetes mellitus, seizure, alcohol abuse, tobacco abuse, dementia, DVT on Xarelto, psychosis, CHF (not sure which type of CHF, no 2-D echo available), who presents with altered mental status and abdominal distention  Patient has AMS and is unable to provide any medical history, therefore, most of the history is obtained by discussing the case with ED physician, per EMS report, and with the nursing staff. Per report, pt was found to be more confused than her baseline since early this afternoon. "she slumped to one side". When I saw pt in ED, she is not oriented x 3, and cannot provide any medical history. Patient was noted to have very extended abdomen. No active cough, respiratory distress, nausea, vomiting, diarrhea noted. She moves all extremities.  ED Course: pt was found to have WBC 18.5, lactic acid of 5.14, INR 1.11, creatinine normal, posteriorly urinalysis with moderate amount of leukocytes, potassium 3.3, temperature 97.4, tachycardia, tachypnea, oxygen saturation 85-100% on room air, chest x-ray showed right upper lobe opacity, CT head negative for acute intracranial abnormalities patient is admitted to telemetry bed as inpatient.  CT-abdomen/pelvis showed:  1. Large amount of retained large bowel stool, stool distended rectum associated with fecal impaction. 2. Sequelae of chronic pancreatitis with multiple small pancreatic cysts most compatible with pseudocyst. 3. Cholelithiasis and extra hepatic biliary dilatation without choledocholithiasis or CT findings of acute cholecystitis. 4. Distended urinary bladder seen with  neurogenic bladder/bladder outlet obstruction. Recommend correlation with voiding. 5. 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 6. Aortic Atherosclerosis (ICD10-I70.0).  Review of Systems:  could not be reviewed due to altered mental status.   Allergy: No Known Allergies  Past Medical History:  Diagnosis Date  . Acute deep vein thrombosis (DVT) of left femoral vein (HCC) 02/22/2015  . Alcohol abuse   . Arthritis   . Chronic constipation 08/26/2015  . Dementia with behavioral problem   . Diabetes mellitus without complication (HCC)   . Dysphagia    and aspiration risk  . 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 Seizures (HCC)     History reviewed. No pertinent surgical history.  Social History:  reports that she has been smoking cigarettes.  She has a 7.50 pack-year smoking history. she has never used smokeless tobacco. She reports that she drinks about 1.2 oz of alcohol per week. She reports that she does not use drugs.  Family History: Could not be reviewed due to altered mental status.  Prior to Admission medications   Medication Sig Start Date End Date Taking? Authorizing Provider  Amino Acids-Protein Hydrolys (FEEDING SUPPLEMENT, PRO-STAT SUGAR FREE 64,) LIQD Take 30 mLs by mouth 2 (two) times daily.   Yes [provider]  cholecalciferol (VITAMIN D) 1000 UNITS tablet Take 1,000 Units by mouth daily. Reported on 10/28/2015   Yes [provider]  diclofenac sodium (VOLTAREN) 1 % GEL Apply 2 g topically 4 (four) times daily. To both knees 06/03/14  Yes Sharee Holster, NP  gabapentin (NEURONTIN) 300 MG capsule Take 3 capsules (900 mg total) by mouth 3 (three) times daily. 06/30/14  Yes Sharee Holster, NP  linagliptin (TRADJENTA) 5 MG TABS tablet Take 5 mg by mouth daily.   Yes [provider]  Melatonin 3 MG TABS Take 3 mg by mouth at bedtime as needed (sleep).    Yes [provider]  metFORMIN (GLUCOPHAGE-XR) 500  MG 24 hr tablet Take 1,000 mg by mouth daily with breakfast.    Yes [provider]  Multiple Vitamins-Minerals (DECUBI-VITE) CAPS Take 2 capsules by mouth at bedtime.   Yes [provider]  Nutritional Supplements (NUTRITIONAL SUPPLEMENT PO) House Supplement - Give nutritional treat by mouth with meals daily for caloric and weight support   Yes [provider]  OLANZapine (ZYPREXA) 10 MG tablet Take 10 mg by mouth at bedtime.   Yes [provider]  polyethylene glycol (MIRALAX / GLYCOLAX) packet Take 17 g by mouth 2 (two) times daily.   Yes [provider]  potassium chloride SA (K-DUR,KLOR-CON) 20 MEQ tablet Take 20 mEq by mouth 2 (two) times daily.    Yes [provider]  rivaroxaban (XARELTO) 20 MG TABS tablet Take 20 mg by mouth at bedtime. 20 mg one tablet by mouth daily at bedtime   Yes [provider]  senna (SENOKOT) 8.6 MG TABS tablet Take 2 tablets (17.2 mg total) by mouth daily. 08/28/15  Yes Clydia Llano, MD  thiamine 100 MG tablet Take 100 mg by mouth daily.   Yes [provider]  torsemide (DEMADEX) 20 MG tablet Give 2 tablets (40mg ) by mouth two times daily 04/14/17  Yes [provider]  traMADol (ULTRAM) 50 MG tablet Take 1 tablet (50 mg total) by mouth every 6 (six) hours. 04/25/17  Yes Sharee Holster, NP  Nutritional Supplements (NUTRITIONAL SUPPLEMENT PO) HSG Regular Diet -  HSG Mech Soft Texture, Regular consistency, patient may have regular texture food items from [employer] for diet order    [provider]    Physical Exam: Vitals:   04/26/17 2233 04/26/17 2323 04/27/17 0044 04/27/17 0639  BP: (!) 164/86 100/61 (!) 92/55 (!) 119/59  Pulse: 100 (!) 118 (!) 106 (!) 105  Resp: 16 16 16  (!) 21  Temp:   98.5 F (36.9 C) 98.9 F (37.2 C)  TempSrc:   Oral Oral  SpO2: 100% 100% 100% 99%   General: Not in acute distress HEENT:       Eyes: PERRL, EOMI, no scleral icterus.       ENT:  No discharge from the ears and nose, no pharynx injection, no tonsillar enlargement.        Neck: No JVD, no bruit, no mass felt. Heme: No neck lymph node enlargement. Cardiac: S1/S2, RRR, No murmurs, No gallops or rubs. Respiratory:  No rales, wheezing, rhonchi or rubs. GI: very distended, seems to have tenderness diffusly,  no organomegaly, BS present. GU: No hematuria Ext: has trace leg edema bilaterally. 2+DP/PT pulse bilaterally. Musculoskeletal: No joint deformities, No joint redness or warmth, no limitation of ROM in spin. Skin: No rashes.  Neuro: AMS, not oriented X3, moves all extremities. Psych: Patient is not psychotic.  Labs on Admission: I have personally reviewed following labs and imaging studies  CBC: Recent Labs  Lab 04/26/17 1937 04/27/17 0130 04/27/17 0601  WBC 18.5* 14.7* 13.5*  NEUTROABS  --  8.7*  --   HGB 11.9* 9.7* 10.1*  HCT 35.9* 29.3* 30.9*  MCV 86.3 86.7 87.5  PLT 297 229 258   Basic Metabolic Panel: Recent Labs  Lab 04/26/17 1937 04/26/17 2207 04/27/17 0601  NA 139  --  141  K 3.3*  --  3.6  CL 96*  --  106  CO2 27  --  27  GLUCOSE 149*  --  119*  BUN 24*  --  17  CREATININE 0.99  --  0.81  CALCIUM 9.6  --  8.7*  MG  --  1.7  --    GFR: Estimated Creatinine Clearance: 67.5 mL/min (by C-G formula based on SCr of 0.81 mg/dL). Liver Function Tests: Recent Labs  Lab 04/26/17 1937  AST 36  ALT 24  ALKPHOS 84  BILITOT 0.5  PROT 8.8*  ALBUMIN 3.7   No results for input(s): LIPASE, AMYLASE in the last 168 hours. Recent Labs  Lab 04/26/17 1937  AMMONIA 18   Coagulation Profile: Recent Labs  Lab 04/26/17 1937  INR 1.11   Cardiac Enzymes: No results for input(s): CKTOTAL, CKMB, CKMBINDEX, TROPONINI in the last 168 hours. BNP (last 3 results) No results for input(s): PROBNP in the last 8760 hours. HbA1C: No results for input(s): HGBA1C in the last 72 hours. CBG: Recent Labs  Lab 04/26/17 1954  GLUCAP 144*   Lipid  Profile: No results for input(s): CHOL, HDL, LDLCALC, TRIG, CHOLHDL, LDLDIRECT in the last 72 hours. Thyroid Function Tests: No results for input(s): TSH, T4TOTAL, FREET4, T3FREE, THYROIDAB in the last 72 hours. Anemia Panel: No results for input(s): VITAMINB12, FOLATE, FERRITIN, TIBC, IRON, RETICCTPCT in the last 72 hours. Urine analysis:    Component Value Date/Time   COLORURINE YELLOW 04/26/2017 1933   APPEARANCEUR CLOUDY (A) 04/26/2017 1933   LABSPEC 1.012 04/26/2017 1933   PHURINE 5.0 04/26/2017 1933   GLUCOSEU NEGATIVE 04/26/2017 1933   HGBUR MODERATE (A) 04/26/2017 1933   BILIRUBINUR NEGATIVE 04/26/2017 1933   KETONESUR NEGATIVE 04/26/2017 1933   PROTEINUR NEGATIVE 04/26/2017 1933   NITRITE NEGATIVE 04/26/2017 1933   LEUKOCYTESUR MODERATE (A) 04/26/2017 1933   Sepsis Labs: @LABRCNTIP (procalcitonin:4,lacticidven:4) ) Recent Results (from the past 240 hour(s))  MRSA PCR Screening     Status: None   Collection Time: 04/27/17 12:56 AM  Result Value Ref Range Status   MRSA by PCR NEGATIVE NEGATIVE Final    Comment:        The GeneXpert MRSA Assay (FDA approved for NASAL specimens only), is one component of a comprehensive MRSA colonization surveillance program. It is not intended to diagnose MRSA infection nor to guide or monitor treatment for MRSA infections.      Radiological Exams on Admission: Dg Chest 2 View  Result Date: 04/26/2017 CLINICAL DATA:  Fatigue.  Mental status changes. EXAM: CHEST  2 VIEW COMPARISON:  None. FINDINGS: Moderately degraded lateral view secondary to patient arm position and obliquity. Mildly oblique frontal radiograph. Moderate right hemidiaphragm elevation. Midline trachea. Normal heart size. Atherosclerosis in the transverse aorta. No pleural effusion or pneumothorax. Mildly low lung volumes. Increased density projecting over the right upper lobe may represent osseous summation shadow and vascular crowding. IMPRESSION: Possible right  upper lobe opacity which could represent infection or be secondary to osseous summation shadow and vascular crowding. If possible, consider PA radiograph. If not possible, consider repeat frontal AP radiograph without obliquity. Aortic Atherosclerosis (ICD10-I70.0). Electronically Signed   By: Jeronimo Greaves M.D.   On: 04/26/2017 21:10   Ct Head Wo Contrast  Result Date: 04/26/2017 CLINICAL DATA:  Altered level of consciousness EXAM: CT HEAD WITHOUT CONTRAST TECHNIQUE: Contiguous axial images were obtained from the base of the skull through the vertex without intravenous contrast. COMPARISON:  03/27/2015 FINDINGS: Brain: No acute territorial infarction, hemorrhage or intracranial mass is visualized. Mild atrophy. Minimal small vessel ischemic changes of the white matter. Stable ventricle size Vascular: No hyperdense vessels.  Carotid artery calcification. Skull: Normal. Negative for fracture or focal lesion. Sinuses/Orbits: No acute finding. Other: None IMPRESSION: No CT evidence for acute intracranial abnormality. Electronically Signed   By: Jasmine PangKim  Fujinaga M.D.   On: 04/26/2017 21:26   Ct Abdomen Pelvis W Contrast  Result Date: 04/26/2017 CLINICAL DATA:  Severe abdominal distension. Altered mental status. History of constipation, alcohol abuse, diabetes. EXAM: CT ABDOMEN AND PELVIS WITH CONTRAST TECHNIQUE: Multidetector CT imaging of the abdomen and pelvis was performed using the standard protocol following bolus administration of intravenous contrast. CONTRAST:  100mL ISOVUE-300 IOPAMIDOL (ISOVUE-300) INJECTION 61% COMPARISON:  CT abdomen and pelvis August 25, 2015 FINDINGS: LOWER CHEST: RIGHT lung base atelectasis. Included heart size is normal. No pericardial effusion. HEPATOBILIARY: Distended gallbladder with multiple subcentimeter gallstones. Dilated Common bile duct at 13 mm, no choledocholithiasis. No CT findings of acute cholecystitis. Normal liver. PANCREAS: Atrophic pancreas with multiple coarse  calcifications. Unchanged 16 mm peripherally calcified cystic mass in distal body, additional smaller cysts most compatible with pseudocyst. Chronic pancreatic duct dilatation. SPLEEN: Normal. ADRENALS/URINARY TRACT: Kidneys are orthotopic, demonstrating symmetric enhancement. No nephrolithiasis, hydronephrosis or solid renal masses. The unopacified ureters are normal in course and caliber. Delayed imaging through the kidneys demonstrates symmetric prompt contrast excretion within the proximal urinary collecting system. Urinary bladder is distended with lobulated urinary bladder with multiple small diverticula seen with neurogenic bladder, similar to prior CT. Normal adrenal glands. STOMACH/BOWEL: Small hiatal hernia. Stool distended rectum at 7.9 cm with large amount of retained large bowel stool. Small amount of small bowel feces compatible with chronic stasis. Normal appendix. VASCULAR/LYMPHATIC: Aortoiliac vessels are normal in course and caliber. Mild intimal thickening calcific atherosclerosis. No lymphadenopathy by CT size criteria. REPRODUCTIVE: Status post hysterectomy. OTHER: No intraperitoneal free fluid or free air. MUSCULOSKELETAL: Nonacute. Mild rectus abdominis diastases. 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 Osteopenia. IMPRESSION: 1. Large amount of retained large bowel stool, stool distended rectum associated with fecal impaction. 2. Sequelae of chronic pancreatitis with multiple small pancreatic cysts most compatible with pseudocyst. 3. Cholelithiasis and extra hepatic biliary dilatation without choledocholithiasis or CT findings of acute cholecystitis. 4. Distended urinary bladder seen with neurogenic bladder/bladder outlet obstruction. Recommend correlation with voiding. 5. 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 Aortic Atherosclerosis (ICD10-I70.0). Electronically Signed   By: Awilda Metroourtnay  Bloomer M.D.   On:  04/26/2017 23:35     EKG: Independently reviewed.  Sinus rhythm, QTC 450, mild T-wave inversion in inferior leads and V3-V6. 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 Elevated lactic acid 5.14. Currently hemodynamically stable. Source of infection include urinary tract infection given positive urinalysis and possible aspiration pneumonia given right upper lobe opacity and altered mental status.  -will admit to telemetry bed as inpatient -IV vancomycin and Zosyn started -Blood culture, urine culture, sputum culture --will get Procalcitonin and trend lactic  acid levels per sepsis protocol. -IVF: 2L of NS bolus in ED, followed by 100 cc/h  -get SLP   Acute metabolic encephalopathy: Likely due to sepsis and UTI Frequent neuro check -hold all oral meds until mental status improves.  Abdominal distension: CT scan showed large amount of retained large bowel stool, stool distended rectum associated with fecal impaction. -FLeet Enema  Hx of Alcohol abuse and tobacco abuse: pt is from SNF, probably not drinking alcohol currently. 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 -prn ativan IV for agitation  Diabetes mellitus with neurological manifestations (HCC): Last A1c 7.8 on 03/21/17, poorly controled. Patient is taking metformin and tradjenta at home -SSI  DVT (deep vein thrombosis): -switch xarelto to iv heparin  Hx of CHF (congestive heart failure) (HCC): No 2 echo on  record, not sure which type of CHF, possibly diastolic congestive heart failure. Patient has trace leg edema, no JVD. CHF seems to be compensated. Hold Lasix due to sepsis -Check BNP  Hypokalemia: K=3.3 on admission. - Repleted - Check Mg level   DVT ppx: on IV Heparin Code Status: Full code Family Communication: None at bed side.  Disposition Plan:  Anticipate discharge back to previous SNF Consults called:   Admission status:  Inpatient/tele       Date of Service 04/27/2017    Lorretta HarpXilin Edana Aguado [facility] Pager [phone]  If 7PM-7AM, please contact night-coverage [email or web address] Password [email or web address] 04/27/2017, 7:10 AM

## 2017-04-27 ENCOUNTER — Encounter (HOSPITAL_COMMUNITY): Payer: Self-pay | Admitting: *Deleted

## 2017-04-27 ENCOUNTER — Other Ambulatory Visit: Payer: Self-pay

## 2017-04-27 DIAGNOSIS — I509 Heart failure, unspecified: Secondary | ICD-10-CM

## 2017-04-27 DIAGNOSIS — A419 Sepsis, unspecified organism: Principal | ICD-10-CM

## 2017-04-27 DIAGNOSIS — J690 Pneumonitis due to inhalation of food and vomit: Secondary | ICD-10-CM

## 2017-04-27 DIAGNOSIS — G9341 Metabolic encephalopathy: Secondary | ICD-10-CM

## 2017-04-27 DIAGNOSIS — I825Y2 Chronic embolism and thrombosis of unspecified deep veins of left proximal lower extremity: Secondary | ICD-10-CM

## 2017-04-27 DIAGNOSIS — N390 Urinary tract infection, site not specified: Secondary | ICD-10-CM

## 2017-04-27 LAB — GLUCOSE, CAPILLARY
GLUCOSE-CAPILLARY: 165 mg/dL — AB (ref 65–99)
GLUCOSE-CAPILLARY: 92 mg/dL (ref 65–99)
Glucose-Capillary: 113 mg/dL — ABNORMAL HIGH (ref 65–99)
Glucose-Capillary: 250 mg/dL — ABNORMAL HIGH (ref 65–99)

## 2017-04-27 LAB — PROCALCITONIN

## 2017-04-27 LAB — CBC WITH DIFFERENTIAL/PLATELET
BASOS ABS: 0 10*3/uL (ref 0.0–0.1)
BASOS PCT: 0 %
EOS ABS: 0.3 10*3/uL (ref 0.0–0.7)
EOS PCT: 2 %
HCT: 29.3 % — ABNORMAL LOW (ref 36.0–46.0)
Hemoglobin: 9.7 g/dL — ABNORMAL LOW (ref 12.0–15.0)
Lymphocytes Relative: 32 %
Lymphs Abs: 4.8 10*3/uL — ABNORMAL HIGH (ref 0.7–4.0)
MCH: 28.7 pg (ref 26.0–34.0)
MCHC: 33.1 g/dL (ref 30.0–36.0)
MCV: 86.7 fL (ref 78.0–100.0)
Monocytes Absolute: 1 10*3/uL (ref 0.1–1.0)
Monocytes Relative: 7 %
Neutro Abs: 8.7 10*3/uL — ABNORMAL HIGH (ref 1.7–7.7)
Neutrophils Relative %: 59 %
PLATELETS: 229 10*3/uL (ref 150–400)
RBC: 3.38 MIL/uL — AB (ref 3.87–5.11)
RDW: 15.7 % — ABNORMAL HIGH (ref 11.5–15.5)
WBC: 14.7 10*3/uL — AB (ref 4.0–10.5)

## 2017-04-27 LAB — APTT
APTT: 51 s — AB (ref 24–36)
aPTT: 42 seconds — ABNORMAL HIGH (ref 24–36)

## 2017-04-27 LAB — BASIC METABOLIC PANEL
Anion gap: 8 (ref 5–15)
BUN: 17 mg/dL (ref 6–20)
CALCIUM: 8.7 mg/dL — AB (ref 8.9–10.3)
CO2: 27 mmol/L (ref 22–32)
CREATININE: 0.81 mg/dL (ref 0.44–1.00)
Chloride: 106 mmol/L (ref 101–111)
GFR calc Af Amer: >60 mL/min (ref >60)
GLUCOSE: 119 mg/dL — AB (ref 65–99)
Potassium: 3.6 mmol/L (ref 3.5–5.1)
Sodium: 141 mmol/L (ref 135–145)

## 2017-04-27 LAB — CBC
HCT: 30.9 % — ABNORMAL LOW (ref 36.0–46.0)
Hemoglobin: 10.1 g/dL — ABNORMAL LOW (ref 12.0–15.0)
MCH: 28.6 pg (ref 26.0–34.0)
MCHC: 32.7 g/dL (ref 30.0–36.0)
MCV: 87.5 fL (ref 78.0–100.0)
PLATELETS: 258 10*3/uL (ref 150–400)
RBC: 3.53 MIL/uL — ABNORMAL LOW (ref 3.87–5.11)
RDW: 16 % — AB (ref 11.5–15.5)
WBC: 13.5 10*3/uL — ABNORMAL HIGH (ref 4.0–10.5)

## 2017-04-27 LAB — LACTIC ACID, PLASMA
LACTIC ACID, VENOUS: 1.5 mmol/L (ref 0.5–1.9)
Lactic Acid, Venous: 1.3 mmol/L (ref 0.5–1.9)

## 2017-04-27 LAB — HEPARIN LEVEL (UNFRACTIONATED)
HEPARIN UNFRACTIONATED: 0.31 [IU]/mL (ref 0.30–0.70)
HEPARIN UNFRACTIONATED: 0.41 [IU]/mL (ref 0.30–0.70)

## 2017-04-27 LAB — HIV ANTIBODY (ROUTINE TESTING W REFLEX): HIV SCREEN 4TH GENERATION: NONREACTIVE

## 2017-04-27 LAB — MRSA PCR SCREENING: MRSA BY PCR: NEGATIVE

## 2017-04-27 MED ORDER — PIPERACILLIN-TAZOBACTAM 3.375 G IVPB
3.3750 g | Freq: Three times a day (TID) | INTRAVENOUS | Status: DC
  Administered 2017-04-27 – 2017-04-30 (×9): 3.375 g via INTRAVENOUS
  Filled 2017-04-27 (×9): qty 50

## 2017-04-27 MED ORDER — NYSTATIN 100000 UNIT/ML MT SUSP
5.0000 mL | Freq: Four times a day (QID) | OROMUCOSAL | Status: DC
  Administered 2017-04-27 – 2017-04-30 (×9): 500000 [IU] via ORAL
  Filled 2017-04-27 (×10): qty 5

## 2017-04-27 MED ORDER — LORAZEPAM 2 MG/ML IJ SOLN
1.0000 mg | Freq: Once | INTRAMUSCULAR | Status: AC
  Administered 2017-04-27: 1 mg via INTRAMUSCULAR

## 2017-04-27 MED ORDER — OLANZAPINE 10 MG PO TABS
10.0000 mg | ORAL_TABLET | Freq: Every day | ORAL | Status: DC
  Filled 2017-04-27 (×3): qty 1

## 2017-04-27 MED ORDER — POLYETHYLENE GLYCOL 3350 17 G PO PACK
17.0000 g | PACK | Freq: Every day | ORAL | Status: DC
  Administered 2017-04-27 – 2017-04-30 (×4): 17 g via ORAL
  Filled 2017-04-27 (×4): qty 1

## 2017-04-27 MED ORDER — MORPHINE SULFATE (PF) 4 MG/ML IV SOLN
1.0000 mg | INTRAVENOUS | Status: DC | PRN
  Administered 2017-04-29 (×2): 1 mg via INTRAVENOUS
  Filled 2017-04-27 (×2): qty 1

## 2017-04-27 MED ORDER — INSULIN ASPART 100 UNIT/ML ~~LOC~~ SOLN
0.0000 [IU] | Freq: Every day | SUBCUTANEOUS | Status: DC
  Administered 2017-04-29: 2 [IU] via SUBCUTANEOUS

## 2017-04-27 MED ORDER — INSULIN ASPART 100 UNIT/ML ~~LOC~~ SOLN
0.0000 [IU] | Freq: Three times a day (TID) | SUBCUTANEOUS | Status: DC
  Administered 2017-04-27: 2 [IU] via SUBCUTANEOUS
  Administered 2017-04-27: 3 [IU] via SUBCUTANEOUS
  Administered 2017-04-28: 1 [IU] via SUBCUTANEOUS
  Administered 2017-04-29: 2 [IU] via SUBCUTANEOUS
  Administered 2017-04-29 – 2017-04-30 (×2): 1 [IU] via SUBCUTANEOUS

## 2017-04-27 MED ORDER — POTASSIUM CHLORIDE 20 MEQ/15ML (10%) PO SOLN
40.0000 meq | Freq: Once | ORAL | Status: AC
  Administered 2017-04-27: 40 meq via ORAL
  Filled 2017-04-27: qty 30

## 2017-04-27 MED ORDER — SENNOSIDES-DOCUSATE SODIUM 8.6-50 MG PO TABS
1.0000 | ORAL_TABLET | Freq: Two times a day (BID) | ORAL | Status: DC
  Administered 2017-04-27 – 2017-04-30 (×4): 1 via ORAL
  Filled 2017-04-27 (×7): qty 1

## 2017-04-27 MED ORDER — VITAMIN B-1 100 MG PO TABS
100.0000 mg | ORAL_TABLET | Freq: Every day | ORAL | Status: DC
  Administered 2017-04-27 – 2017-04-30 (×3): 100 mg via ORAL
  Filled 2017-04-27 (×3): qty 1

## 2017-04-27 MED ORDER — FLEET ENEMA 7-19 GM/118ML RE ENEM
1.0000 | ENEMA | Freq: Once | RECTAL | Status: AC
Start: 2017-04-27 — End: 2017-04-27
  Administered 2017-04-27: 1 via RECTAL
  Filled 2017-04-27: qty 1

## 2017-04-27 MED ORDER — SORBITOL 70 % SOLN
960.0000 mL | TOPICAL_OIL | Freq: Once | ORAL | Status: DC
  Filled 2017-04-27: qty 473

## 2017-04-27 NOTE — Evaluation (Signed)
Clinical/Bedside Swallow Evaluation Patient Details  Name: Sarah Mckay MRN: [id number] Date of Birth: 07/27/1955  Today's Date: 04/27/2017 Time: SLP Start Time (ACUTE ONLY): 1335 SLP Stop Time (ACUTE ONLY): 1400 SLP Time Calculation (min) (ACUTE ONLY): 25 min  Past Medical History:  Past Medical History:  Diagnosis Date  . Acute deep vein thrombosis (DVT) of left femoral vein (HCC) 02/22/2015  . Alcohol abuse   . Arthritis   . Chronic constipation 08/26/2015  . Dementia with behavioral problem   . Diabetes mellitus without complication (HCC)   . Dysphagia    and aspiration risk  . 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 Seizures (HCC)    Past Surgical History: History reviewed. No pertinent surgical history. HPI:  61 [facility] = diagnosed with sepsis.  Per imaging study, pt may have bowel obstruction.  Swallow evaluation ordered.  Pt has h/o aspiration, dysphagia and is on a mechnical soft diet at her facility.     Assessment / Plan / Recommendation Clinical Impression  Pt with functional oropharyngeal swallow based on clinical swallow evaluation.  Pt is edentulous and did demonstrate decreased oral control/mastication with solids.  She does not however have oral residuals.  Subtle throat clear x1 with sequential large boluses of thin.  Given pt has possible right lobe infiltrate, SLP will follow up x1 to assure tolerance and/or indication for instrumental swallow evaluation.  Recommend dys3/ground meat/thin.  Thanks.  SLP Visit Diagnosis: Dysphagia, oropharyngeal phase (R13.12)    Aspiration Risk  Moderate aspiration risk    Diet Recommendation Dysphagia 3 (Mech soft);Thin liquid   Liquid Administration via: Cup;Straw Medication Administration: Whole meds with puree Compensations: Minimize environmental distractions;Slow rate;Small sips/bites(clear throat if voice gurgly) Postural Changes: Seated upright at 90 degrees;Remain upright for at least 30 minutes  after po intake    Other  Recommendations Oral Care Recommendations: Oral care BID   Follow up Recommendations   tbd    Frequency and Duration min 1 x/week  1 week       Prognosis Prognosis for Safe Diet Advancement: Fair Barriers to Reach Goals: Cognitive deficits      Swallow Study   General Date of Onset: 04/27/17 HPI: 61 [facility] = diagnosed with sepsis.  Per imaging study, pt may have bowel obstruction.  Swallow evaluation ordered.  Pt has h/o aspiration, dysphagia and is on a mechnical soft diet at her facility.   Type of Study: Bedside Swallow Evaluation Diet Prior to this Study: NPO Temperature Spikes Noted: No Respiratory Status: Room air History of Recent Intubation: No Behavior/Cognition: Alert;Doesn't follow directions Oral Cavity Assessment: Within Functional Limits Oral Care Completed by SLP: Yes Oral Cavity - Dentition: Edentulous(does not have dentures) Vision: Functional for self-feeding Self-Feeding Abilities: Able to feed self Patient Positioning: Upright in bed Baseline Vocal Quality: Normal Volitional Cough: Cognitively unable to elicit Volitional Swallow: Unable to elicit    Oral/Motor/Sensory Function Overall Oral Motor/Sensory Function: Mild impairment   Ice Chips Ice chips: Not tested   Thin Liquid Other Comments: voice wet x1 with sequential swallows, small boluses tolerated well    Nectar Thick Nectar Thick Liquid: Not tested   Honey Thick Honey Thick Liquid: Not tested   Puree Puree: Within functional limits Presentation: Spoon   Solid   GO   Solid: Impaired Presentation: Self Fed Oral Phase Impairments: Impaired mastication;Reduced labial seal Oral Phase Functional Implications: Prolonged oral transit  [name], Louella Medaglia Ann 04/27/2017,2:12 PM   Donavan Burnetamara Ross Hefferan, MS [facility] SLP [phone]

## 2017-04-27 NOTE — Progress Notes (Signed)
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)   Diabetes mellitus with neurological manifestations (HCC)   UTI (urinary tract infection)   Upper leg DVT (deep venous thromboembolism), chronic, left (HCC)   Acute metabolic encephalopathy   Aspiration pneumonia (HCC)   Abdominal distension   CHF (congestive heart failure) (HCC)  Sepsis presented on admission with Encephalopathy , h/o severe dementia/schizophrenia (due to h/o alcohol use per snf note) -She is a long term snf resident -SNF staff reports patient was not her self in the last two days, she is more lethargic and less talkative -on admission , she has sinus tachycardia, hypothermia, leukocytosis and lactic acidosis with uti/aspiration pneumonia -she is improving on  ivf,  vanc and zosyn, blood culture/urine culture  in process, mrsa screening negative -Per chart review, patient has history of ampicillin resistant E. coli in the urine, will continue Zosyn for now.    Acute urinary retention: Bladder scan 555 cc, foley ordered on 11/30  UTI?  Urine culture pending, she has h/o ampicillin resistance ecoli in urine in the past, on zosyn   Severe fecal impaction/Constipation No result with fleets enema Ordered smog/miralax/ senokot -s  Aspiration pneumonia?  cxr on admission" Possible right upper lobe opacity which could represent infection or be secondary to osseous summation shadow and vascular crowding. If possible, consider PA radiograph. If not possible, consider repeat frontal AP radiograph without obliquity." On room air Dysphagia 3 diet/ground meat/thin liquid per Swallow eval  Aspiration precaution Continue zosyn  Oral  thrush: topical nystatin  Hypokalemia, replace K  noninsulin dependent dm2 Recent A1c in October was 7.8 Hold metformin and linagliptin Start on SSI   H/o DVT on chronic anticoagulation  History of lower extremity edema on Demadex, Demadex held in the setting of infection. Hx of CHF (congestive heart failure) (HCC): per nursing home records. No 2 echo on record, not sure which type of CHF, possibly diastolic congestive heart failure. Patient has trace leg edema, no JVD. CHF seems to be compensated.    h/o severe dementia/schizophrenia , Long term SNF resident, total care, need lift for transfer  Continue with Zyprexa and thiamine   Code Status: full  Family Communication: patient   Disposition Plan: return to snf when medically stable   Consultants:  none  Procedures:  none  Antibiotics:  Vanc from admission to 11/30  zosyn from admission   Objective: BP (!) 119/59 (BP Location: Right Arm)   Pulse (!) 105   Temp 98.9 F (37.2 C) (Oral)   Resp (!) 21   LMP 03/25/2015 (Approximate)   SpO2 99%   Intake/Output Summary (Last 24 hours) at 04/27/2017 1109 Last data filed at 04/27/2017 0600 Gross per 24 hour  Intake 2103.33 ml  Output -  Net 2103.33 ml   There were no vitals filed for this visit.  Exam: Patient is examined daily including today on 04/27/2017, exams remain the same as of yesterday except that has changed    General:  Demented elderly only oriented to self, sitting up in chair, NAD, does follow commands when I saw her  Cardiovascular: RRR  Respiratory: CTABL  Abdomen: Soft/ND/NT, positive BS  Musculoskeletal: ? Trace lower extremity Edema, left toe ray  amputation  Neuro: alert, oriented to self only  Data Reviewed: Basic Metabolic Panel: Recent Labs  Lab 04/26/17 1937 04/26/17 2207 04/27/17 0601  NA 139  --  141  K 3.3*  --  3.6  CL 96*  --  106  CO2 27  --  27  GLUCOSE 149*  --  119*  BUN 24*  --  17  CREATININE 0.99   --  0.81  CALCIUM 9.6  --  8.7*  MG  --  1.7  --    Liver Function Tests: Recent Labs  Lab 04/26/17 1937  AST 36  ALT 24  ALKPHOS 84  BILITOT 0.5  PROT 8.8*  ALBUMIN 3.7   No results for input(s): LIPASE, AMYLASE in the last 168 hours. Recent Labs  Lab 04/26/17 1937  AMMONIA 18   CBC: Recent Labs  Lab 04/26/17 1937 04/27/17 0130 04/27/17 0601  WBC 18.5* 14.7* 13.5*  NEUTROABS  --  8.7*  --   HGB 11.9* 9.7* 10.1*  HCT 35.9* 29.3* 30.9*  MCV 86.3 86.7 87.5  PLT 297 229 258   Cardiac Enzymes:   No results for input(s): CKTOTAL, CKMB, CKMBINDEX, TROPONINI in the last 168 hours. BNP (last 3 results) Recent Labs    04/26/17 2210  BNP 52.2    ProBNP (last 3 results) No results for input(s): PROBNP in the last 8760 hours.  CBG: Recent Labs  Lab 04/26/17 1954 04/27/17 0745  GLUCAP 144* 113*    Recent Results (from the past 240 hour(s))  MRSA PCR Screening     Status: None   Collection Time: 04/27/17 12:56 AM  Result Value Ref Range Status   MRSA by PCR NEGATIVE NEGATIVE Final    Comment:        The GeneXpert MRSA Assay (FDA approved for NASAL specimens only), is one component of a comprehensive MRSA colonization surveillance program. It is not intended to diagnose MRSA infection nor to guide or monitor treatment for MRSA infections.      Studies: Dg Chest 2 View  Result Date: 04/26/2017 CLINICAL DATA:  Fatigue.  Mental status changes. EXAM: CHEST  2 VIEW COMPARISON:  None. FINDINGS: Moderately degraded lateral view secondary to patient arm position and obliquity. Mildly oblique frontal radiograph. Moderate right hemidiaphragm elevation. Midline trachea. Normal heart size. Atherosclerosis in the transverse aorta. No pleural effusion or pneumothorax. Mildly low lung volumes. Increased density projecting over the right upper lobe may represent osseous summation shadow and vascular crowding. IMPRESSION: Possible right upper lobe opacity which could  represent infection or be secondary to osseous summation shadow and vascular crowding. If possible, consider PA radiograph. If not possible, consider repeat frontal AP radiograph without obliquity. Aortic Atherosclerosis (ICD10-I70.0). Electronically Signed   By: Jeronimo Greaves M.D.   On: 04/26/2017 21:10   Ct Head Wo Contrast  Result Date: 04/26/2017 CLINICAL DATA:  Altered level of consciousness EXAM: CT HEAD WITHOUT CONTRAST TECHNIQUE: Contiguous axial images were obtained from the base of the skull through the vertex without intravenous contrast. COMPARISON:  03/27/2015 FINDINGS: Brain: No acute territorial infarction, hemorrhage or intracranial mass is visualized. Mild atrophy. Minimal small vessel ischemic changes of the white matter. Stable ventricle size Vascular: No hyperdense vessels.  Carotid artery calcification. Skull: Normal. Negative for fracture or focal lesion. Sinuses/Orbits: No acute finding. Other: None IMPRESSION: No CT evidence for acute intracranial abnormality. Electronically Signed   By: Jasmine Pang M.D.   On: 04/26/2017 21:26   Ct Abdomen Pelvis W Contrast  Result Date: 04/26/2017 CLINICAL DATA:  Severe abdominal distension. Altered mental status. History of constipation, alcohol abuse, diabetes. EXAM: CT ABDOMEN AND PELVIS WITH CONTRAST TECHNIQUE: Multidetector CT imaging of the abdomen and pelvis was performed using the standard protocol following bolus administration of intravenous contrast. CONTRAST:  100mL ISOVUE-300 IOPAMIDOL (ISOVUE-300) INJECTION 61% COMPARISON:  CT abdomen and pelvis August 25, 2015 FINDINGS: LOWER CHEST: RIGHT lung base atelectasis. Included heart size is normal. No pericardial effusion. HEPATOBILIARY: Distended gallbladder with multiple subcentimeter gallstones. Dilated Common bile duct at 13 mm, no choledocholithiasis. No CT findings of acute cholecystitis. Normal liver. PANCREAS: Atrophic pancreas with multiple coarse calcifications. Unchanged 16 mm  peripherally calcified cystic mass in distal body, additional smaller cysts most compatible with pseudocyst. Chronic pancreatic duct dilatation. SPLEEN: Normal. ADRENALS/URINARY TRACT: Kidneys are orthotopic, demonstrating symmetric enhancement. No nephrolithiasis, hydronephrosis or solid renal masses. The unopacified ureters are normal in course and caliber. Delayed imaging through the kidneys demonstrates symmetric prompt contrast excretion within the proximal urinary collecting system. Urinary bladder is distended with lobulated urinary bladder with multiple small diverticula seen with neurogenic bladder, similar to prior CT. Normal adrenal glands. STOMACH/BOWEL: Small hiatal hernia. Stool distended rectum at 7.9 cm with large amount of retained large bowel stool. Small amount of small bowel feces compatible with chronic stasis. Normal appendix. VASCULAR/LYMPHATIC: Aortoiliac vessels are normal in course and caliber. Mild intimal thickening calcific atherosclerosis. No lymphadenopathy by CT size criteria. REPRODUCTIVE: Status post hysterectomy. OTHER: No intraperitoneal free fluid or free air. MUSCULOSKELETAL: Nonacute. Mild rectus abdominis diastases. 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 Osteopenia. IMPRESSION: 1. Large amount of retained large bowel stool, stool distended rectum associated with fecal impaction. 2. Sequelae of chronic pancreatitis with multiple small pancreatic cysts most compatible with pseudocyst. 3. Cholelithiasis and extra hepatic biliary dilatation without choledocholithiasis or CT findings of acute cholecystitis. 4. Distended urinary bladder seen with neurogenic bladder/bladder outlet obstruction. Recommend correlation with voiding. 5. 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 Aortic Atherosclerosis (ICD10-I70.0). Electronically Signed   By: Awilda Metroourtnay  Bloomer M.D.   On: 04/26/2017 23:35    Scheduled  Meds: . insulin aspart  0-5 Units Subcutaneous QHS  . insulin aspart  0-9 Units Subcutaneous TID WC  . nicotine  21 mg Transdermal Daily  . sorbitol, milk of mag, mineral oil, glycerin (SMOG) enema  960 mL Rectal Once    Continuous Infusions: . sodium chloride 100 mL/hr at 04/26/17 2228  . heparin 1,100 Units/hr (04/27/17 0803)  . piperacillin-tazobactam (ZOSYN)  IV 3.375 g (04/27/17 0930)  . vancomycin       Time spent: 35mins I have personally reviewed and interpreted on  04/27/2017 daily labs, tele strips, imagings as discussed above under date review session and assessment and plans.  I reviewed all nursing notes, pharmacy notes, consultant notes,  vitals, pertinent old records  I have discussed plan of care as described above with RN , patient on 04/27/2017   Albertine GratesFang Cami Delawder MD, PhD  [facility] Pager [phone]. If 7PM-7AM, please contact night-coverage at [email or web address], password [email or web address] 04/27/2017, 11:09 AM  LOS: 1 day

## 2017-04-27 NOTE — Progress Notes (Signed)
ANTICOAGULATION CONSULT NOTE - Follow Up Consult  Pharmacy Consult for heparin Indication: hx of DVT, on xarelto  No Known Allergies  Patient Measurements:   Heparin Dosing Weight: 64.4 kg  Vital Signs: Temp: 98.9 F (37.2 C) (11/30 0639) Temp Source: Oral (11/30 0639) BP: 119/59 (11/30 0639) Pulse Rate: 105 (11/30 0639)  Labs: Recent Labs    04/26/17 1937 04/26/17 2000 04/27/17 0130 04/27/17 0601  HGB 11.9*  --  9.7* 10.1*  HCT 35.9*  --  29.3* 30.9*  PLT 297  --  229 258  APTT 30  --   --  42*  LABPROT 14.2  --   --   --   INR 1.11  --   --   --   HEPARINUNFRC  --  0.26*  --  0.31  CREATININE 0.99  --   --  0.81    Estimated Creatinine Clearance: 67.5 mL/min (by C-G formula based on SCr of 0.81 mg/dL).    Assessment: 61 y.o. female with medical history significant of diabetes mellitus, seizure, alcohol abuse, tobacco abuse, dementia, DVT on Xarelto, psychosis, CHF who presents with altered mental status and abdominal distention.  Per MAR last dose of xarelto was 11/28 at 2000.  Pharmacy consulted to dose heparin while pt is NPO.  04/27/2017 Heparin level 0.31 APTT 42 subtherapeutic H/H low, Plts WNL No bleeding reported per RN   Goal of Therapy:  Heparin level 0.3-0.7 units/ml  APTT goal 66-102 Monitor platelets by anticoagulation protocol   Plan:  -Increase heparin rate to 1100 units/hr -check heparin and APTT in 6 hours, continue to check APTT levels until correlates with heparin level -Daily CBC and heparin level  Arley PhenixEllen Breylin Dom RPh 04/27/2017, 7:37 AM Pager [phone]

## 2017-04-27 NOTE — Evaluation (Signed)
{  EVAL NOTES:3SLP Cancellation Note  Patient Details Name: Sarah OxfordFrieda Mckay MRN: [id number] DOB: 01/09/1956   Cancelled treatment:       Reason Eval/Treat Not Completed: (pt npo, per RN - ? if pt NPO for surgery, RN to get clarification; note pt with possible ileus)   [name], Metta Koranda Ann 04/27/2017, 10:39 AM  Donavan Burnetamara Amariona Rathje, MS [facility] SLP [phone]

## 2017-04-27 NOTE — Progress Notes (Signed)
When attempting to given patient smog enema, enema tube was unable to get past impacted stool.  Lubrication was used around the perimeter of impaction, and patient was able to push out extremely large, long bowel movement.

## 2017-04-27 NOTE — Progress Notes (Signed)
Spoke with Ace in the lab, regarding the status of a urine culture.  Specimen was received and is in process.

## 2017-04-27 NOTE — Progress Notes (Signed)
ANTICOAGULATION CONSULT NOTE - Follow Up Consult  Pharmacy Consult for heparin Indication: hx of DVT, on xarelto  No Known Allergies  Patient Measurements:   Heparin Dosing Weight: 64.4 kg  Vital Signs: Temp: 98.7 F (37.1 C) (11/30 1323) Temp Source: Oral (11/30 1323) BP: 121/67 (11/30 1323) Pulse Rate: 105 (11/30 1323)  Labs: Recent Labs    04/26/17 04/26/17 1937 04/26/17 2000 04/27/17 0130 04/27/17 0601 04/27/17 1344  HGB 12.4 11.9*  --  9.7* 10.1*  --   HCT 39 35.9*  --  29.3* 30.9*  --   PLT 290 297  --  229 258  --   APTT  --  30  --   --  42* 51*  LABPROT  --  14.2  --   --   --   --   INR  --  1.11  --   --   --   --   HEPARINUNFRC  --   --  0.26*  --  0.31 0.41  CREATININE 0.8 0.99  --   --  0.81  --     Estimated Creatinine Clearance: 67.5 mL/min (by C-G formula based on SCr of 0.81 mg/dL).    Assessment: 61 y.o. female with medical history significant of diabetes mellitus, seizure, alcohol abuse, tobacco abuse, dementia, DVT on Xarelto, psychosis, CHF who presents with altered mental status and abdominal distention.  Per MAR last dose of xarelto was 11/28 at 2000.  Pharmacy consulted to dose heparin while pt is NPO.  04/27/2017 2nd level today Heparin level 0.41 APTT 51 subtherapeutic H/H low, Plts WNL No bleeding reported per RN   Goal of Therapy:  Heparin level 0.3-0.7 units/ml  APTT goal 66-102 Monitor platelets by anticoagulation protocol   Plan:  -Increase heparin rate to 1250 units/hr -check heparin and APTT in 6 hours, continue to check APTT levels until correlates with heparin level -Daily CBC and heparin level  Arley PhenixEllen Sulo Janczak RPh 04/27/2017, 3:09 PM Pager [phone]

## 2017-04-27 NOTE — Progress Notes (Signed)
ANTICOAGULATION CONSULT NOTE - Follow Up Consult  Pharmacy Consult for Heparin Indication: Hx of DVT; PTA Xarelto on hold  Medications:  Infusions:  . sodium chloride 100 mL/hr at 04/26/17 2228  . heparin 1,250 Units/hr (04/27/17 1820)  . piperacillin-tazobactam (ZOSYN)  IV 3.375 g (04/27/17 1841)    Assessment: 61 y.o.femalewith medical history significant ofdiabetes mellitus, seizure, alcohol abuse, tobacco abuse, dementia, DVT onXarelto,psychosis, CHF whopresents with altered mental status and abdominal distention.  Per MAR last dose of xarelto was 11/28 at 2000.  Pharmacy consulted to dose heparin while pt is NPO.   RN called to report that pt pulled out IV at 19:20 and heparin infusion was d/c. Scheduled labs at 2100 (Heparin level and APTT) were rescheduled. No bleeding or complications reported.   Goal of Therapy:  Heparin level 0.3-0.7 units/ml Monitor platelets by anticoagulation protocol: Yes   Plan:  Resume heparin IV infusion at 1250 units/hr APTT and Heparin level 6 hours after re-starting Continue to check APTT levels until correlates with heparin level  Lynann Beaverhristine Rishaan Gunner PharmD, BCPS Pager [phone] 04/27/2017 8:41 PM

## 2017-04-28 ENCOUNTER — Inpatient Hospital Stay (HOSPITAL_COMMUNITY): Payer: Medicaid Other

## 2017-04-28 DIAGNOSIS — F0150 Vascular dementia without behavioral disturbance: Secondary | ICD-10-CM

## 2017-04-28 DIAGNOSIS — I509 Heart failure, unspecified: Secondary | ICD-10-CM

## 2017-04-28 LAB — CBC
HCT: 26.5 % — ABNORMAL LOW (ref 36.0–46.0)
HEMOGLOBIN: 8.7 g/dL — AB (ref 12.0–15.0)
MCH: 28.5 pg (ref 26.0–34.0)
MCHC: 32.8 g/dL (ref 30.0–36.0)
MCV: 86.9 fL (ref 78.0–100.0)
Platelets: 206 10*3/uL (ref 150–400)
RBC: 3.05 MIL/uL — ABNORMAL LOW (ref 3.87–5.11)
RDW: 15.6 % — ABNORMAL HIGH (ref 11.5–15.5)
WBC: 12.9 10*3/uL — ABNORMAL HIGH (ref 4.0–10.5)

## 2017-04-28 LAB — ECHOCARDIOGRAM COMPLETE
HEIGHTINCHES: 63 in
WEIGHTICAEL: 2582.03 [oz_av]

## 2017-04-28 LAB — COMPREHENSIVE METABOLIC PANEL
ALK PHOS: 57 U/L (ref 38–126)
ALT: 21 U/L (ref 14–54)
ANION GAP: 3 — AB (ref 5–15)
AST: 22 U/L (ref 15–41)
Albumin: 2.7 g/dL — ABNORMAL LOW (ref 3.5–5.0)
BUN: 7 mg/dL (ref 6–20)
CALCIUM: 8.6 mg/dL — AB (ref 8.9–10.3)
CO2: 25 mmol/L (ref 22–32)
CREATININE: 0.61 mg/dL (ref 0.44–1.00)
Chloride: 116 mmol/L — ABNORMAL HIGH (ref 101–111)
Glucose, Bld: 110 mg/dL — ABNORMAL HIGH (ref 65–99)
Potassium: 3.5 mmol/L (ref 3.5–5.1)
SODIUM: 144 mmol/L (ref 135–145)
TOTAL PROTEIN: 5.9 g/dL — AB (ref 6.5–8.1)
Total Bilirubin: 0.5 mg/dL (ref 0.3–1.2)

## 2017-04-28 LAB — MAGNESIUM: MAGNESIUM: 1.8 mg/dL (ref 1.7–2.4)

## 2017-04-28 LAB — GLUCOSE, CAPILLARY
GLUCOSE-CAPILLARY: 142 mg/dL — AB (ref 65–99)
Glucose-Capillary: 115 mg/dL — ABNORMAL HIGH (ref 65–99)
Glucose-Capillary: 121 mg/dL — ABNORMAL HIGH (ref 65–99)
Glucose-Capillary: 115 mg/dL — ABNORMAL HIGH (ref 65–99)

## 2017-04-28 LAB — HEPARIN LEVEL (UNFRACTIONATED)
Heparin Unfractionated: 0.76 IU/mL — ABNORMAL HIGH (ref 0.30–0.70)
Heparin Unfractionated: 0.72 IU/mL — ABNORMAL HIGH (ref 0.30–0.70)

## 2017-04-28 LAB — APTT: aPTT: 95 seconds — ABNORMAL HIGH (ref 24–36)

## 2017-04-28 LAB — AMMONIA: AMMONIA: 14 umol/L (ref 9–35)

## 2017-04-28 LAB — LIPASE, BLOOD: LIPASE: 16 U/L (ref 11–51)

## 2017-04-28 MED ORDER — POTASSIUM CHLORIDE 20 MEQ/15ML (10%) PO SOLN
40.0000 meq | Freq: Once | ORAL | Status: AC
  Administered 2017-04-28: 40 meq via ORAL
  Filled 2017-04-28: qty 30

## 2017-04-28 MED ORDER — HEPARIN (PORCINE) IN NACL 100-0.45 UNIT/ML-% IJ SOLN
1200.0000 [IU]/h | INTRAMUSCULAR | Status: DC
  Administered 2017-04-28: 1200 [IU]/h via INTRAVENOUS
  Filled 2017-04-28: qty 250

## 2017-04-28 MED ORDER — ENOXAPARIN SODIUM 80 MG/0.8ML ~~LOC~~ SOLN
70.0000 mg | Freq: Two times a day (BID) | SUBCUTANEOUS | Status: DC
  Administered 2017-04-28 – 2017-04-29 (×2): 70 mg via SUBCUTANEOUS
  Filled 2017-04-28 (×2): qty 0.8

## 2017-04-28 MED ORDER — HALOPERIDOL LACTATE 5 MG/ML IJ SOLN
0.5000 mg | Freq: Four times a day (QID) | INTRAMUSCULAR | Status: DC | PRN
  Administered 2017-04-28 – 2017-04-29 (×2): 0.5 mg via INTRAVENOUS
  Filled 2017-04-28 (×2): qty 1

## 2017-04-28 MED ORDER — HEPARIN (PORCINE) IN NACL 100-0.45 UNIT/ML-% IJ SOLN
1100.0000 [IU]/h | INTRAMUSCULAR | Status: DC
  Filled 2017-04-28: qty 250

## 2017-04-28 NOTE — Progress Notes (Signed)
ANTICOAGULATION CONSULT NOTE - Follow Up Consult  Pharmacy Consult for Heparin Indication: Hx of DVT; PTA Xarelto on hold  Medications:  Infusions:  . sodium chloride 100 mL/hr at 04/26/17 2228  . heparin    . piperacillin-tazobactam (ZOSYN)  IV 3.375 g (04/27/17 1841)    Assessment: 61 y.o.femalewith medical history significant ofdiabetes mellitus, seizure, alcohol abuse, tobacco abuse, dementia, DVT onXarelto,psychosis, CHF whopresents with altered mental status and abdominal distention.  Per MAR last dose of xarelto was 11/28 at 2000.  Pharmacy consulted to dose heparin while pt is NPO.   RN called to report that pt pulled out IV at 19:20 and heparin infusion was d/c. Scheduled labs at 2100 (Heparin level and APTT) were rescheduled. No bleeding or complications reported. Today, 12/1 0256 HL=0.76 and aptt=95  Both at high end of goal, no bleeding/infusion issues per RN.   Goal of Therapy:  Heparin level 0.3-0.7 units/ml Monitor platelets by anticoagulation protocol: Yes   Plan:  Decrease heparin drip to 1200 units/hr Recheck HL in 6 hours Will stop checking aptt as levels are correlating  [name], Ameena Vesey R 04/28/2017 3:55 AM

## 2017-04-28 NOTE — Progress Notes (Signed)
  Echocardiogram 2D Echocardiogram has been performed.  [employer]aya Mcjunkins L Androw 04/28/2017, 2:18 PM

## 2017-04-28 NOTE — Progress Notes (Signed)
Pt agitated, non-redirectable, PRN medication given for agitation and ineffective. Pt moved to chair to try to calm pt but no effect and unsafe. MD paged. Nursing staff pulled off floor until new orders received. Justin Mendaudle, Billi Bright H, RN

## 2017-04-28 NOTE — Progress Notes (Addendum)
ANTICOAGULATION CONSULT NOTE - Follow Up Consult  Pharmacy Consult for Heparin --> Lovenox Indication: Hx of DVT; PTA Xarelto on hold  Medications:  Infusions:  . sodium chloride 100 mL/hr at 04/26/17 2228  . heparin 1,200 Units/hr (04/28/17 0359)  . piperacillin-tazobactam (ZOSYN)  IV 3.375 g (04/28/17 [id number])    Assessment: 61 y.o.femalewith medical history significant ofdiabetes mellitus, seizure, alcohol abuse, tobacco abuse, dementia, DVT onXarelto,psychosis, CHF whopresents with altered mental status and abdominal distention.  Per MAR last dose of xarelto was 11/28 at 2000.  Pharmacy consulted to dose heparin while pt is NPO.  Today, 12/1 - 1038 HL=0.72, supratherapeutic with infusion at 1200 units/hr.  - Hgb decrease to 8.7, platelets WNL. No bleeding per RN.  Goal of Therapy:  Heparin level 0.3-0.7 units/ml Monitor platelets by anticoagulation protocol: Yes   Plan:  Decrease heparin drip to 1100 units/hr Recheck HL in 6 hours Daily HL and CBC while on heparin infusion.  Clance BollRunyon, Tonisha Silvey 04/28/2017 11:27 AM  ADDENDUM 04/28/2017 5:56 PM Received consult to transition patient to Lovenox.  Plan: Stop heparin infusion. Start Lovenox ~1 hour following discontinuation of heparin infusion. Lovenox 70 mg (1 mg/kg) q12h. Plan was communicated to patient's RN. Check CBC at least q72h.  Clance BollAmanda Beatris Belen, PharmD, BCPS Pager: [phone] 04/28/2017 5:58 PM

## 2017-04-28 NOTE — Progress Notes (Signed)
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)   Diabetes mellitus with neurological manifestations (HCC)   UTI (urinary tract infection)   Upper leg DVT (deep venous thromboembolism), chronic, left (HCC)   Acute metabolic encephalopathy   Aspiration pneumonia (HCC)   Abdominal distension   CHF (congestive heart failure) (HCC)  Sepsis presented on admission with Encephalopathy , h/o severe dementia/schizophrenia (due to h/o alcohol use per snf note) -She is a long term snf resident -SNF staff reports patient was not her self in the last two days, she is more lethargic and less talkative -on admission , she has sinus tachycardia, hypothermia, leukocytosis and lactic acidosis with uti/aspiration pneumonia - blood culture no growth, urine culture  in process, mrsa screening negative -she is improving on  ivf,  vanc and zosyn,Per chart review, patient has history of ampicillin resistant E. coli in the urine, vanc d/ced on 11/30, continue Zosyn for now, awaiting urine culture result. D/c ivf.   Acute urinary retention: Bladder scan 555 cc, foley ordered on 11/30  UTI?  Urine culture pending, she has h/o ampicillin resistance ecoli in urine in the past, on zosyn   Severe fecal impaction/Constipation Started to have bm after smog/miralax/ senokot -s  Aspiration pneumonia?  cxr on admission" Possible right upper lobe opacity which could represent infection or be secondary to osseous summation shadow and vascular crowding. If possible, consider PA radiograph. If not possible, consider repeat frontal AP  radiograph without obliquity." On room air Dysphagia 3 diet/ground meat/thin liquid per Swallow eval  Aspiration precaution Continue zosyn  Oral thrush: topical nystatin, improved  Hypokalemia, replace K  noninsulin dependent dm2 Recent A1c in October was 7.8 Hold metformin and linagliptin Start on SSI   H/o DVT on chronic anticoagulation Oral meds held, she is on heparin drip since admission  History of lower extremity edema on Demadex, Demadex held in the setting of infection. Hx of CHF (congestive heart failure) (HCC): per nursing home records. No 2 echo on record, not sure which type of CHF, possibly diastolic congestive heart failure.  She has bilateral lower extremity pitting edema, d/c ivf, echo ordered   Anemia:  New, no obvious sign of bleed Anemia work up Stop heparin drip if hgb continue to drop  h/o severe dementia/schizophrenia , Long term SNF resident, total care, need lift for transfer  Continue with Zyprexa and thiamine   Code Status: full  Family Communication: patient   Disposition Plan: return to snf when medically stable   Consultants:  none  Procedures:  none  Antibiotics:  Vanc from admission to 11/30  zosyn from admission   Objective: BP (!) 98/53 (BP Location: Left Arm)   Pulse 95   Temp 98 F (36.7 C) (Axillary)   Resp 18   Wt 73.2 kg (161 lb 6 oz)   LMP 03/25/2015 (Approximate)   SpO2 100%   BMI 28.59 kg/m   Intake/Output Summary (Last 24 hours) at 04/28/2017 1156 Last data filed at 04/28/2017 1110 Gross per 24 hour  Intake 4001.68 ml  Output 1600 ml  Net 2401.68 ml   [employer]  04/28/17 0651  Weight: 73.2 kg (161 lb 6 oz)    Exam: Patient is examined daily including today on 04/28/2017, exams remain the same as of yesterday except that has changed    General:  Demented elderly only oriented to self, very frail, falling into sleep during conversation. does follow commands when awake  Cardiovascular:  RRR  Respiratory: CTABL  Abdomen: Soft/ND/NT, positive BS  Musculoskeletal: bilateral  lower extremity pitting Edema, left toe ray amputation  Neuro: alert, oriented to self only  Data Reviewed: Basic Metabolic Panel: Recent Labs  Lab 04/26/17 04/26/17 1937 04/26/17 2207 04/27/17 0601 04/28/17 0253  NA 144 139  --  141 144  K 3.8 3.3*  --  3.6 3.5  CL  --  96*  --  106 116*  CO2  --  27  --  27 25  GLUCOSE  --  149*  --  119* 110*  BUN 22* 24*  --  17 7  CREATININE 0.8 0.99  --  0.81 0.61  CALCIUM  --  9.6  --  8.7* 8.6*  MG  --   --  1.7  --  1.8   Liver Function Tests: Recent Labs  Lab 04/26/17 04/26/17 1937 04/28/17 0253  AST 22 36 22  ALT 23 24 21   ALKPHOS 92 84 57  BILITOT  --  0.5 0.5  PROT  --  8.8* 5.9*  ALBUMIN  --  3.7 2.7*   Recent Labs  Lab 04/28/17 0253  LIPASE 16   Recent Labs  Lab 04/26/17 1937 04/28/17 0253  AMMONIA 18 14   CBC: Recent Labs  Lab 04/26/17 04/26/17 1937 04/27/17 0130 04/27/17 0601 04/28/17 0253  WBC 16.9 18.5* 14.7* 13.5* 12.9*  NEUTROABS 12  --  8.7*  --   --   HGB 12.4 11.9* 9.7* 10.1* 8.7*  HCT 39 35.9* 29.3* 30.9* 26.5*  MCV  --  86.3 86.7 87.5 86.9  PLT 290 297 229 258 206   Cardiac Enzymes:   No results for input(s): CKTOTAL, CKMB, CKMBINDEX, TROPONINI in the last 168 hours. BNP (last 3 results) Recent Labs    04/26/17 2210  BNP 52.2    ProBNP (last 3 results) No results for input(s): PROBNP in the last 8760 hours.  CBG: Recent Labs  Lab 04/27/17 1155 04/27/17 1646 04/27/17 2156 04/28/17 0735 04/28/17 1125  GLUCAP 250* 165* 92 115* 121*    Recent Results (from the past 240 hour(s))  Culture, blood (Routine X 2) w Reflex to ID Panel     Status: None (Preliminary result)   Collection Time: 04/26/17  7:55 PM  Result Value Ref Range Status   Specimen Description BLOOD LEFT ARM  Final   Special Requests   Final    BOTTLES DRAWN AEROBIC AND ANAEROBIC Blood Culture adequate volume   Culture    Final    NO GROWTH 2 DAYS Performed at [facility], [address] [place], [place] [address]    Report Status PENDING  Incomplete  Culture, blood (Routine X 2) w Reflex to ID Panel     Status: None (Preliminary result)   Collection Time: 04/26/17  8:10 PM  Result Value Ref Range Status   Specimen Description BLOOD RIGHT ARM  Final   Special Requests   Final    BOTTLES DRAWN AEROBIC AND ANAEROBIC Blood Culture adequate volume   Culture   Final    NO GROWTH 2 DAYS Performed at [facility], [address] [place], [place] [address]    Report Status PENDING  Incomplete  MRSA PCR Screening     Status: None   Collection Time: 04/27/17 12:56 AM  Result Value Ref Range Status   MRSA by PCR NEGATIVE NEGATIVE Final    Comment:        The GeneXpert MRSA Assay (FDA approved for NASAL specimens only), is one component of a comprehensive MRSA colonization surveillance program. It is not intended to diagnose MRSA infection nor to guide or monitor treatment for MRSA infections.      Studies: No results found.  Scheduled Meds: . insulin aspart  0-5 Units Subcutaneous QHS  . insulin aspart  0-9 Units Subcutaneous TID WC  . nicotine  21 mg Transdermal Daily  . nystatin  5 mL Oral QID  . OLANZapine  10 mg Oral QHS  . polyethylene glycol  17 g Oral Daily  . senna-docusate  1 tablet Oral BID  . sorbitol, milk of mag, mineral oil, glycerin (SMOG) enema  960 mL Rectal Once  . thiamine  100 mg Oral Daily    Continuous Infusions: . sodium chloride 100 mL/hr at 04/28/17 0715  . heparin 1,100 Units/hr (04/28/17 1150)  . piperacillin-tazobactam (ZOSYN)  IV 3.375 g (04/28/17 6045)     Time spent: I have personally reviewed and interpreted on  04/28/2017 daily labs, tele strips, imagings as discussed above under date review session and assessment and plans.  I reviewed all nursing notes, pharmacy notes, vitals, pertinent old records  I have discussed plan of care  as described above with RN , patient on 04/28/2017   Albertine Grates MD, PhD  [facility] Pager [phone]. If 7PM-7AM, please contact night-coverage at [email or web address], password [facility] 04/28/2017, 11:56 AM  LOS: 2 days

## 2017-04-29 ENCOUNTER — Inpatient Hospital Stay (HOSPITAL_COMMUNITY): Payer: Medicaid Other

## 2017-04-29 DIAGNOSIS — I5042 Chronic combined systolic (congestive) and diastolic (congestive) heart failure: Secondary | ICD-10-CM

## 2017-04-29 DIAGNOSIS — R310 Gross hematuria: Secondary | ICD-10-CM

## 2017-04-29 DIAGNOSIS — E876 Hypokalemia: Secondary | ICD-10-CM

## 2017-04-29 DIAGNOSIS — L8990 Pressure ulcer of unspecified site, unspecified stage: Secondary | ICD-10-CM

## 2017-04-29 LAB — URINALYSIS, ROUTINE W REFLEX MICROSCOPIC
Bilirubin Urine: NEGATIVE
GLUCOSE, UA: NEGATIVE mg/dL
Ketones, ur: NEGATIVE mg/dL
Leukocytes, UA: NEGATIVE
Nitrite: NEGATIVE
PH: 6 (ref 5.0–8.0)
Protein, ur: 100 mg/dL — AB
SPECIFIC GRAVITY, URINE: 1.005 (ref 1.005–1.030)
SQUAMOUS EPITHELIAL / LPF: NONE SEEN

## 2017-04-29 LAB — GLUCOSE, CAPILLARY
GLUCOSE-CAPILLARY: 125 mg/dL — AB (ref 65–99)
GLUCOSE-CAPILLARY: 180 mg/dL — AB (ref 65–99)
Glucose-Capillary: 212 mg/dL — ABNORMAL HIGH (ref 65–99)

## 2017-04-29 LAB — BASIC METABOLIC PANEL
Anion gap: 9 (ref 5–15)
CHLORIDE: 112 mmol/L — AB (ref 101–111)
CO2: 21 mmol/L — ABNORMAL LOW (ref 22–32)
Calcium: 8.6 mg/dL — ABNORMAL LOW (ref 8.9–10.3)
Creatinine, Ser: 0.63 mg/dL (ref 0.44–1.00)
GFR calc Af Amer: >60 mL/min (ref >60)
GFR calc non Af Amer: >60 mL/min (ref >60)
Glucose, Bld: 101 mg/dL — ABNORMAL HIGH (ref 65–99)
POTASSIUM: 3.2 mmol/L — AB (ref 3.5–5.1)
SODIUM: 142 mmol/L (ref 135–145)

## 2017-04-29 LAB — CBC
HCT: 26.5 % — ABNORMAL LOW (ref 36.0–46.0)
HEMOGLOBIN: 8.6 g/dL — AB (ref 12.0–15.0)
MCH: 28.1 pg (ref 26.0–34.0)
MCHC: 32.5 g/dL (ref 30.0–36.0)
MCV: 86.6 fL (ref 78.0–100.0)
Platelets: 269 10*3/uL (ref 150–400)
RBC: 3.06 MIL/uL — ABNORMAL LOW (ref 3.87–5.11)
RDW: 15.6 % — AB (ref 11.5–15.5)
WBC: 10 10*3/uL (ref 4.0–10.5)

## 2017-04-29 LAB — URINE CULTURE: Culture: >=100000 — AB

## 2017-04-29 LAB — TYPE AND SCREEN
ABO/RH(D): O POS
Antibody Screen: NEGATIVE

## 2017-04-29 LAB — CORTISOL: CORTISOL PLASMA: 14.4 ug/dL

## 2017-04-29 MED ORDER — POTASSIUM CHLORIDE 20 MEQ/15ML (10%) PO SOLN
40.0000 meq | Freq: Once | ORAL | Status: AC
  Administered 2017-04-29: 40 meq via ORAL
  Filled 2017-04-29: qty 30

## 2017-04-29 MED ORDER — FUROSEMIDE 10 MG/ML IJ SOLN
40.0000 mg | Freq: Once | INTRAMUSCULAR | Status: AC
  Administered 2017-04-29: 40 mg via INTRAVENOUS
  Filled 2017-04-29: qty 4

## 2017-04-29 MED ORDER — HALOPERIDOL LACTATE 5 MG/ML IJ SOLN: 0.5000 mg | Freq: Four times a day (QID) | INTRAMUSCULAR | Status: DC | PRN

## 2017-04-29 NOTE — Progress Notes (Signed)
PROGRESS NOTE  Crisoforo OxfordFrieda Silvers ZOX:[id number] DOB: 01/10/1956 DOA: 04/26/2017 PCP: Kirt Boysarter, Monica, DO  HPI/Recap of past 24 hours:  Advance dementia only oriented to self, doing better around people, getting agitated when left alone She is awake and interactive today  she is getting fed now  Gross hematuria in foley, which is new, no fever, she does not seem in pain.  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)   Diabetes mellitus with neurological manifestations (HCC)   UTI (urinary tract infection)   Upper leg DVT (deep venous thromboembolism), chronic, left (HCC)   Acute metabolic encephalopathy   Aspiration pneumonia (HCC)   Abdominal distension   CHF (congestive heart failure) (HCC)   Vascular dementia without behavioral disturbance  Sepsis presented on admission with Encephalopathy , h/o severe dementia/schizophrenia (due to h/o alcohol use per snf note) -She is a long term snf resident -SNF staff reports patient was not her self in the last two days, she is more lethargic and less talkative -on admission , she has sinus tachycardia, hypothermia, leukocytosis and lactic acidosis with uti/aspiration pneumonia - blood culture no growth, mrsa screening negative -she is improving on  ivf,  vanc and zosyn,Per chart review, patient has history of ampicillin resistant E. coli in the urine, vanc d/ced on 11/30, continue Zosyn for now,   Acute urinary retention/UTI/gross hematuria: -Bladder scan 555 cc on presentation, foley placed 11/30 - urine culture  +ecoli, -patient developed gross hematuria on 12/2, repeat ua and urine culture, case discussed with urology Dr Annabell HowellsWrenn who recommend hold anticoagulation, keep foley in place, if gross hematuria does not improve , need to call urology for formal consult, if hematuria resolved, need to keep foley in and outpatient urology follow up   Aspiration pneumonia?   -cxr on admission" Possible right upper lobe opacity which could represent infection or be secondary to osseous summation shadow and vascular crowding. If possible, consider PA radiograph. If not possible, consider repeat frontal AP radiograph without obliquity." -On room air -Dysphagia 3 diet/ground meat/thin liquid per Swallow eval  --Aspiration precaution -Continue zosyn -get ct chest  Anemia:  Developed gross hematuria on 12/2 Stool FOBT pending Anemia work up Stop anticoagulation with gross hematuria and drop of  hgb   H/o DVT on chronic anticoagulation Oral meds held, she is on heparin drip since admission Anticoagulation held on 12/2 due to gross hematuria and drop of hgb  History of lower extremity edema on Demadex, Demadex held in the setting of infection. Hx of CHF (congestive heart failure) (HCC): per nursing home records. No 2 echo on record,  She has bilateral lower extremity pitting edema, d/c ivf,  echo lvef 20-25%, grade 2 diastolic dysfunction Iv lasix   Oral thrush: topical nystatin, improved  Hypokalemia, replace K  Severe fecal impaction/Constipation (presented on admission) Started to have bm after smog/miralax/ senokot -s   noninsulin dependent dm2 Recent A1c in October was 7.8 Hold metformin and linagliptin Start on SSI  h/o severe dementia/schizophrenia , Long term SNF resident, total care, need lift for transfer  Continue with Zyprexa and thiamine   Code Status: full  Family Communication: patient   Disposition Plan: return to snf when medically stable   Consultants:  Phone conversation with urology Dr Annabell HowellsWrenn  Procedures:  none  Antibiotics:  Vanc from admission to 11/30  zosyn from admission   Objective: BP 113/62 (BP Location: Left Arm)   Pulse Marland Kitchen(!)  102   Temp 98.5 F (36.9 C) (Oral)   Resp 18   Wt 73.2 kg (161 lb 6 oz)   LMP 03/25/2015 (Approximate)   SpO2 100%   BMI 28.59 kg/m   Intake/Output Summary (Last 24  hours) at 04/29/2017 1213 Last data filed at 04/29/2017 1610 Gross per 24 hour  Intake 1060.23 ml  Output 450 ml  Net 610.23 ml   Filed Weights   04/28/17 0651  Weight: 73.2 kg (161 lb 6 oz)    Exam: Patient is examined daily including today on 04/29/2017, exams remain the same as of yesterday except that has changed    General:  Demented only oriented to self, very frail, appear older than stated age, she does follow commands  Cardiovascular: RRR  Respiratory: diminished at bassis  Abdomen: Soft/ND/NT, positive BS  Musculoskeletal: bilateral  lower extremity pitting Edema, left toe ray amputation  Neuro: alert, oriented to self only  Data Reviewed: Basic Metabolic Panel: Recent Labs  Lab 04/26/17 04/26/17 1937 04/26/17 2207 04/27/17 0601 04/28/17 0253 04/29/17 0615  NA 144 139  --  141 144 142  K 3.8 3.3*  --  3.6 3.5 3.2*  CL  --  96*  --  106 116* 112*  CO2  --  27  --  27 25 21*  GLUCOSE  --  149*  --  119* 110* 101*  BUN 22* 24*  --  17 7 <5*  CREATININE 0.8 0.99  --  0.81 0.61 0.63  CALCIUM  --  9.6  --  8.7* 8.6* 8.6*  MG  --   --  1.7  --  1.8  --    Liver Function Tests: Recent Labs  Lab 04/26/17 04/26/17 1937 04/28/17 0253  AST 22 36 22  ALT 23 24 21   ALKPHOS 92 84 57  BILITOT  --  0.5 0.5  PROT  --  8.8* 5.9*  ALBUMIN  --  3.7 2.7*   Recent Labs  Lab 04/28/17 0253  LIPASE 16   Recent Labs  Lab 04/26/17 1937 04/28/17 0253  AMMONIA 18 14   CBC: Recent Labs  Lab 04/26/17 04/26/17 1937 04/27/17 0130 04/27/17 0601 04/28/17 0253 04/29/17 0615  WBC 16.9 18.5* 14.7* 13.5* 12.9* 10.0  NEUTROABS 12  --  8.7*  --   --   --   HGB 12.4 11.9* 9.7* 10.1* 8.7* 8.6*  HCT 39 35.9* 29.3* 30.9* 26.5* 26.5*  MCV  --  86.3 86.7 87.5 86.9 86.6  PLT 290 297 229 258 206 269   Cardiac Enzymes:   No results for input(s): CKTOTAL, CKMB, CKMBINDEX, TROPONINI in the last 168 hours. BNP (last 3 results) Recent Labs    04/26/17 2210  BNP 52.2     ProBNP (last 3 results) No results for input(s): PROBNP in the last 8760 hours.  CBG: Recent Labs  Lab 04/28/17 1125 04/28/17 1707 04/28/17 2138 04/29/17 0753 04/29/17 1134  GLUCAP 121* 115* 142* 125* 180*    Recent Results (from the past 240 hour(s))  Culture, blood (Routine X 2) w Reflex to ID Panel     Status: None (Preliminary result)   Collection Time: 04/26/17  7:55 PM  Result Value Ref Range Status   Specimen Description BLOOD LEFT ARM  Final   Special Requests   Final    BOTTLES DRAWN AEROBIC AND ANAEROBIC Blood Culture adequate volume   Culture   Final    NO GROWTH 2 DAYS Performed at [facility], [address] [place], [place] [address]    Report Status PENDING  Incomplete  Culture, blood (Routine X 2) w Reflex to ID Panel     Status: None (Preliminary result)   Collection Time: 04/26/17  8:10 PM  Result Value Ref Range Status   Specimen Description BLOOD RIGHT ARM  Final   Special Requests   Final    BOTTLES DRAWN AEROBIC AND ANAEROBIC Blood Culture adequate volume   Culture   Final    NO GROWTH 2 DAYS Performed at [facility], [address] [place], [place] [address]    Report Status PENDING  Incomplete  Urine Culture     Status: Abnormal   Collection Time: 04/27/17 12:18 AM  Result Value Ref Range Status   Specimen Description URINE, RANDOM  Final   Special Requests NONE  Final   Culture >=100,000 COLONIES/mL ESCHERICHIA COLI (A)  Final   Report Status 04/29/2017 FINAL  Final   Organism ID, Bacteria ESCHERICHIA COLI (A)  Final      Susceptibility   Escherichia coli - MIC*    AMPICILLIN 16 INTERMEDIATE Intermediate     CEFAZOLIN <=4 SENSITIVE Sensitive     CEFTRIAXONE <=1 SENSITIVE Sensitive     CIPROFLOXACIN <=0.25 SENSITIVE Sensitive     GENTAMICIN 2 SENSITIVE Sensitive     IMIPENEM <=0.25 SENSITIVE Sensitive     NITROFURANTOIN <=16 SENSITIVE Sensitive     TRIMETH/SULFA >=320 RESISTANT Resistant     AMPICILLIN/SULBACTAM 4  SENSITIVE Sensitive     PIP/TAZO <=4 SENSITIVE Sensitive     Extended ESBL NEGATIVE Sensitive     * >=100,000 COLONIES/mL ESCHERICHIA COLI  MRSA PCR Screening     Status: None   Collection Time: 04/27/17 12:56 AM  Result Value Ref Range Status   MRSA by PCR NEGATIVE NEGATIVE Final    Comment:        The GeneXpert MRSA Assay (FDA approved for NASAL specimens only), is one component of a comprehensive MRSA colonization surveillance program. It is not intended to diagnose MRSA infection nor to guide or monitor treatment for MRSA infections.      Studies: No results found.  Scheduled Meds: . insulin aspart  0-5 Units Subcutaneous QHS  . insulin aspart  0-9 Units Subcutaneous TID WC  . nicotine  21 mg Transdermal Daily  . nystatin  5 mL Oral QID  . OLANZapine  10 mg Oral QHS  . polyethylene glycol  17 g Oral Daily  . senna-docusate  1 tablet Oral BID  . sorbitol, milk of mag, mineral oil, glycerin (SMOG) enema  960 mL Rectal Once  . thiamine  100 mg Oral Daily    Continuous Infusions: . piperacillin-tazobactam (ZOSYN)  IV 3.375 g (04/29/17 [id number])     Time spent: 35mins, case discussed with urology Dr Annabell HowellsWrenn I have personally reviewed and interpreted on  04/29/2017 daily labs, tele strips, imagings as discussed above under date review session and assessment and plans.  I reviewed all nursing notes, pharmacy notes, vitals, pertinent old records  I have discussed plan of care as described above with RN , patient on 04/29/2017   Albertine GratesFang Karsen Nakanishi MD, PhD  [facility] Pager [phone]. If 7PM-7AM, please contact night-coverage at [email or web address], password [email or web address] 04/29/2017, 12:13 PM  LOS: 3 days

## 2017-04-29 NOTE — Progress Notes (Signed)
Pharmacy Antibiotic Note  Sarah Mckay is a 61 y.o. female admitted on 04/26/2017 with UTI and possible aspiration pneumonia.  Pharmacy has been consulted for Zosyn dosing.  Day #4 Zosyn 3.375g IV q8h (4 hour infusion time).  WBC normalized.  Urine growing E.coli sensitive to Unasyn.  Renal function WNL.  Plan: Continue Zosyn 3.375g IV q8h (4 hour infusion time).  Consider narrowing to Unasyn for treatment of E.coli UTI and continued coverage of possible aspiration pneumonia.  Dosage for Zosyn remains stable and need for further dosage adjustment appears unlikely at present.  Pharmacy will sign off at this time.  Please reconsult if a change in clinical status warrants re-evaluation of dosage.  Weight: 161 lb 6 oz (73.2 kg)  Temp (24hrs), Avg:98.6 F (37 C), Min:98.2 F (36.8 C), Max:99.2 F (37.3 C)  Recent Labs  Lab 04/26/17 04/26/17 1937 04/26/17 1957 04/27/17 0126 04/27/17 0130 04/27/17 0601 04/28/17 0253 04/29/17 0615  WBC 16.9 18.5*  --   --  14.7* 13.5* 12.9* 10.0  CREATININE 0.8 0.99  --   --   --  0.81 0.61 0.63  LATICACIDVEN  --   --  5.14* 1.3  --  1.5  --   --     Estimated Creatinine Clearance: 72.6 mL/min (by C-G formula based on SCr of 0.63 mg/dL).    No Known Allergies  Antimicrobials this admission: 11/29 zosyn >>  11/29 vancomycin >> 11/30  Microbiology results: 11/29 BCx: ngtd 11/30 UCx: E.coli (I- amp, R- bactrim, otherwise sensitive) 11/30 MRSA PCR: neg  Thank you for allowing pharmacy to be a part of this patient's care.  Clance BollRunyon, Latiqua Daloia 04/29/2017 11:32 AM

## 2017-04-30 DIAGNOSIS — A4151 Sepsis due to Escherichia coli [E. coli]: Secondary | ICD-10-CM

## 2017-04-30 DIAGNOSIS — R319 Hematuria, unspecified: Secondary | ICD-10-CM

## 2017-04-30 DIAGNOSIS — I5022 Chronic systolic (congestive) heart failure: Secondary | ICD-10-CM

## 2017-04-30 LAB — URINE CULTURE: Culture: NO GROWTH

## 2017-04-30 LAB — CBC WITH DIFFERENTIAL/PLATELET
BASOS ABS: 0 10*3/uL (ref 0.0–0.1)
Basophils Relative: 0 %
Eosinophils Absolute: 0.3 10*3/uL (ref 0.0–0.7)
Eosinophils Relative: 3 %
HEMATOCRIT: 28.2 % — AB (ref 36.0–46.0)
Hemoglobin: 9.4 g/dL — ABNORMAL LOW (ref 12.0–15.0)
LYMPHS PCT: 21 %
Lymphs Abs: 2.2 10*3/uL (ref 0.7–4.0)
MCH: 28.6 pg (ref 26.0–34.0)
MCHC: 33.3 g/dL (ref 30.0–36.0)
MCV: 85.7 fL (ref 78.0–100.0)
Monocytes Absolute: 0.4 10*3/uL (ref 0.1–1.0)
Monocytes Relative: 4 %
NEUTROS ABS: 7.7 10*3/uL (ref 1.7–7.7)
NEUTROS PCT: 72 %
Platelets: 282 10*3/uL (ref 150–400)
RBC: 3.29 MIL/uL — AB (ref 3.87–5.11)
RDW: 15.8 % — ABNORMAL HIGH (ref 11.5–15.5)
WBC: 10.5 10*3/uL (ref 4.0–10.5)

## 2017-04-30 LAB — BASIC METABOLIC PANEL
ANION GAP: 9 (ref 5–15)
BUN: <5 mg/dL — ABNORMAL LOW (ref 6–20)
CO2: 23 mmol/L (ref 22–32)
Calcium: 8.6 mg/dL — ABNORMAL LOW (ref 8.9–10.3)
Chloride: 112 mmol/L — ABNORMAL HIGH (ref 101–111)
Creatinine, Ser: 0.63 mg/dL (ref 0.44–1.00)
GFR calc Af Amer: >60 mL/min (ref >60)
Glucose, Bld: 110 mg/dL — ABNORMAL HIGH (ref 65–99)
POTASSIUM: 3 mmol/L — AB (ref 3.5–5.1)
SODIUM: 144 mmol/L (ref 135–145)

## 2017-04-30 LAB — IRON AND TIBC
IRON: 43 ug/dL (ref 28–170)
Saturation Ratios: 18 % (ref 10.4–31.8)
TIBC: 235 ug/dL — ABNORMAL LOW (ref 250–450)
UIBC: 192 ug/dL

## 2017-04-30 LAB — VITAMIN B12: VITAMIN B 12: 1282 pg/mL — AB (ref 180–914)

## 2017-04-30 LAB — FOLATE: FOLATE: 33 ng/mL (ref >5.9)

## 2017-04-30 LAB — RETICULOCYTES
RBC.: 3.25 MIL/uL — ABNORMAL LOW (ref 3.87–5.11)
Retic Count, Absolute: 42.3 10*3/uL (ref 19.0–186.0)
Retic Ct Pct: 1.3 % (ref 0.4–3.1)

## 2017-04-30 LAB — ABO/RH: ABO/RH(D): O POS

## 2017-04-30 LAB — GLUCOSE, CAPILLARY
GLUCOSE-CAPILLARY: 116 mg/dL — AB (ref 65–99)
GLUCOSE-CAPILLARY: 148 mg/dL — AB (ref 65–99)
Glucose-Capillary: 91 mg/dL (ref 65–99)

## 2017-04-30 LAB — MAGNESIUM: MAGNESIUM: 1.8 mg/dL (ref 1.7–2.4)

## 2017-04-30 MED ORDER — POTASSIUM CHLORIDE 20 MEQ/15ML (10%) PO SOLN
40.0000 meq | Freq: Once | ORAL | Status: AC
  Administered 2017-04-30: 40 meq via ORAL
  Filled 2017-04-30: qty 30

## 2017-04-30 MED ORDER — RIVAROXABAN 20 MG PO TABS: 20.0000 mg | ORAL_TABLET | Freq: Every day | ORAL | 0 refills | Status: DC

## 2017-04-30 MED ORDER — AMOXICILLIN-POT CLAVULANATE 875-125 MG PO TABS: 1.0000 | ORAL_TABLET | Freq: Two times a day (BID) | ORAL | Status: DC

## 2017-04-30 MED ORDER — COLLAGENASE 250 UNIT/GM EX OINT
TOPICAL_OINTMENT | Freq: Every day | CUTANEOUS | Status: DC
  Administered 2017-04-30: 15:00:00 via TOPICAL
  Filled 2017-04-30: qty 90

## 2017-04-30 MED ORDER — COLLAGENASE 250 UNIT/GM EX OINT: TOPICAL_OINTMENT | Freq: Every day | CUTANEOUS | 0 refills | Status: DC

## 2017-04-30 MED ORDER — SENNOSIDES-DOCUSATE SODIUM 8.6-50 MG PO TABS: 1.0000 | ORAL_TABLET | Freq: Two times a day (BID) | ORAL | 0 refills | Status: AC

## 2017-04-30 MED ORDER — AMOXICILLIN-POT CLAVULANATE 875-125 MG PO TABS: 1.0000 | ORAL_TABLET | Freq: Two times a day (BID) | ORAL | 0 refills | Status: DC

## 2017-04-30 NOTE — Discharge Summary (Signed)
Discharge Summary  Kamorah Nevils OZH:[id number] DOB: 06/08/1955  PCP: Kirt Boys, DO  Admit date: 04/26/2017 Discharge date: 04/30/2017  Time spent: >55mins, more than 50% time spent on coordination of care  Recommendations for Outpatient Follow-up:  1. F/u with SNF MD  for hospital discharge follow up, repeat cbc/bmp at follow up 2. F/u with urology within a week for urinary retention, hematuria, hold xarelto until hematuria resolves. 3. Continue wound care 4. Aspiration precaution   Discharge Diagnoses:  Active Hospital Problems   Diagnosis Date Noted  . Sepsis (HCC) 09/16/2015  . Pressure injury of skin 04/29/2017  . Vascular dementia without behavioral disturbance   . CHF (congestive heart failure) (HCC) 04/27/2017  . Acute metabolic encephalopathy 04/26/2017  . Aspiration pneumonia (HCC) 04/26/2017  . Abdominal distension 04/26/2017  . Upper leg DVT (deep venous thromboembolism), chronic, left (HCC) 10/19/2016  . UTI (urinary tract infection) 08/26/2015  . Diabetes mellitus with neurological manifestations (HCC) 10/22/2014  . Alcohol abuse   . 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    Discharge Condition: stable  Diet recommendation: heart healthy/carb modified  Dysphagia 3 (Mech soft);Thin liquid   Liquid Administration via: Cup;Straw Medication Administration: Whole meds with puree Compensations: Minimize environmental distractions;Slow rate;Small sips/bites(clear throat if voice gurgly) Postural Changes: Seated upright at 90 degrees;Remain upright for at least 30 minutes after po intake   Filed Weights   04/28/17 0651 04/30/17 0617  Weight: 73.2 kg (161 lb 6 oz) 66.9 kg (147 lb 7.8 oz)    History of present illness:  PCP: Kirt Boys, DO   Patient coming from:  The patient is coming from SNF.  At baseline, pt is dependent for most of ADL.  Chief Complaint: AMS and abdominal  distention  HPI: Dawt Reeb is a 61 y.o. female with medical history significant of diabetes mellitus, seizure, alcohol abuse, tobacco abuse, dementia, DVT on Xarelto, psychosis, CHF (not sure which type of CHF, no 2-D echo available), who presents with altered mental status and abdominal distention  Patient has AMS and is unable to provide any medical history, therefore, most of the history is obtained by discussing the case with ED physician, per EMS report, and with the nursing staff. Per report, pt was found to be more confused than her baseline since early this afternoon. "she slumped to one side". When I saw pt in ED, she is not oriented x 3, and cannot provide any medical history. Patient was noted to have very extended abdomen. No active cough, respiratory distress, nausea, vomiting, diarrhea noted. She moves all extremities.  ED Course: pt was found to have WBC 18.5, lactic acid of 5.14, INR 1.11, creatinine normal, posteriorly urinalysis with moderate amount of leukocytes, potassium 3.3, temperature 97.4, tachycardia, tachypnea, oxygen saturation 85-100% on room air, chest x-ray showed right upper lobe opacity, CT head negative for acute intracranial abnormalities patient is admitted to telemetry bed as inpatient.  CT-abdomen/pelvis showed:  1. Large amount of retained large bowel stool, stool distended rectum associated with fecal impaction. 2. Sequelae of chronic pancreatitis with multiple small pancreatic cysts most compatible with pseudocyst. 3. Cholelithiasis and extra hepatic biliary dilatation without choledocholithiasis or CT findings of acute cholecystitis. 4. Distended urinary bladder seen with neurogenic bladder/bladder outlet obstruction. Recommend correlation with voiding. 5. 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 6. Aortic Atherosclerosis (ICD10-I70.0).    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)   Diabetes mellitus with neurological manifestations (HCC)   UTI (urinary tract infection)   Upper leg DVT (deep venous thromboembolism), chronic, left (HCC)   Acute metabolic encephalopathy   Aspiration pneumonia (HCC)   Abdominal distension   CHF (congestive heart failure) (HCC)   Vascular dementia without behavioral disturbance   Pressure injury of skin  Sepsis presented on admission with Encephalopathy , h/o severe dementia/schizophrenia (due to h/o alcohol use per snf note) -She is a long term snf resident -SNF staff reports patient was not her self in the last two days, she is more lethargic and less talkative -on admission , she has sinus tachycardia, hypothermia, leukocytosis and lactic acidosis with uti/aspiration pneumonia - blood culture no growth, mrsa screening negative -she is improving on  ivf,  vanc and zosyn, vanc d/ced on 11/30, she received Zosyn in the hospital -she is discharged on augmentin   Acute urinary retention/UTI/gross hematuria: -Bladder scan 555 cc on presentation, foley placed 11/30 - urine culture  +ecoli, (resistant to bactrim, intermediate to ampicillin, but sensitive to the other abx tested including ampicillin/sulbactam) -patient developed gross hematuria on 12/2, repeat ua and urine culture, case discussed with urology Dr Annabell HowellsWrenn who recommend hold anticoagulation, keep foley in place, need to keep foley in and outpatient urology follow up -hematuria is resolving, hgb stable at discharge, she is to follow up with urology within a week.   Aspiration pneumonia?  -cxr on admission" Possible right upper lobe opacity which could represent infection or be secondary to osseous summation shadow and vascular crowding. If possible, consider PA radiograph. If not possible, consider repeat frontal AP radiograph without obliquity." -On room air -Dysphagia 3 diet/ground meat/thin liquid per Swallow  eval  --Aspiration precaution -received zosyn,  -ct chest on 12/2  No pneumonia.  6 mm nodule in the right lung base Non-contrast chest CT at 6-12 months is recommended. If the nodule is stable at time of repeat CT, then future CT at 18-24 months (from today's scan) is considered optional for low-risk patients, but is recommended for high-risk patients.  Anemia:  She did not require blood transfusion, hgb stable for the last several days Developed gross hematuria on 12/2 Stool FOBT not  Collected  Tsh/b12/folate/iron study unremarkable, retic count inappropriately low. SNF to perform FOBT to r/o gi loss.  Stop anticoagulation with gross hematuria  Resume anticoagulation once hematuria resolves.  H/o DVT on chronic anticoagulation Oral meds held, she is on heparin drip since admission Anticoagulation held on 12/2 due to gross hematuria and drop of hgb Hematuria improved, hgb stable , 9.4 at discharge Continue hold anticoagulation until hematuria resolves.  History of lower extremity edema on Demadex, Demadex held in the setting of infection. Hx ofCHF (congestive heart failure) (HCC):per nursing home records. Previous echo records  She has bilateral lower extremity pitting edema, received iv lasix echo done this hospitalization showed lvef 20-25%, grade 2 diastolic dysfunction Resume demadex at discharge. SNF MD to monitor volume status, she is euvolemic at discharge.   Oral thrush: topical nystatin, resolved.  Hypokalemia, replace K replaced in the hospital,  snf md to monitor k level and supplement as needed  Severe fecal impaction/Constipation (presented on admission) Started to have bm after smog/miralax/ senokot -s Continue bowel regiment, prevent constipation at SNF.   noninsulin dependent dm2 Recent A1c in October was 7.8 Hold metformin and linagliptin held in the hospital, resumed at discharge  h/o severe dementia/schizophrenia , Long term SNF  resident, total care, need lift for transfer  Continue with Zyprexa and thiamine   Reason for Consult:Unstageable PrI to sacrum Wound type:Pressure Pressure Injury POA: Yes Measurement:2cm x 1.2cm with depth unable to be determine due to the presence of yellow fibrinous slough that is firmly adherent  Wound bed:As described above Drainage (amount, consistency, odor) None Periwound:intact with evidence of previous wound healing Dressing procedure/placement/frequency:I will today being application of an enzymatic debriding agent (collagenase) to the Unstageable pressure injury, simultaneously, I will be using turning and repositioning to offload the affected area.  Left LE with resolving edema, but bilateral heels are at risk for pressure injury, so I will provide pressure redistribution heel boots. Patient prefers the supine position,  She is educated about this being deleterious to her sacral ulcer and indicates understanding    Code Status: full  Family Communication: patient   Disposition Plan: return to snf    Consultants:  Phone conversation with urology Dr Annabell Howells  Procedures:  Foley insertion  Antibiotics:  Vanc from admission to 11/30  zosyn from admission   Discharge Exam: BP 122/64 (BP Location: Left Arm)   Pulse 88   Temp 98.8 F (37.1 C) (Oral)   Resp 20   Wt 66.9 kg (147 lb 7.8 oz)   LMP 03/25/2015 (Approximate)   SpO2 100%   BMI 26.13 kg/m    General:  Demented only oriented to self, very frail, appear older than stated age, she does follow commands and is calm and interactive today  Cardiovascular: RRR  Respiratory: diminished at bassis  Abdomen: Soft/ND/NT, positive BS  Musculoskeletal: bilateral  lower extremity pitting Edema has improved, left toe ray amputation  Neuro: alert, oriented to self only   Discharge Instructions You were cared for by a hospitalist during your hospital stay. If you have any questions about your  discharge medications or the care you received while you were in the hospital after you are discharged, you can call the unit and asked to speak with the hospitalist on call if the hospitalist that took care of you is not available. Once you are discharged, your primary care physician will handle any further medical issues. Please note that NO REFILLS for any discharge medications will be authorized once you are discharged, as it is imperative that you return to your primary care physician (or establish a relationship with a primary care physician if you do not have one) for your aftercare needs so that they can reassess your need for medications and monitor your lab values.  Discharge Instructions    Diet - low sodium heart healthy   Complete by:  As directed    Carb modified   Discharge instructions   Complete by:  As directed    Dysphagia 3 (Mech soft);Thin liquid   Liquid Administration via: Cup;Straw Medication Administration: Whole meds with puree Compensations: Minimize environmental distractions;Slow rate;Small sips/bites(clear throat if voice gurgly) Postural Changes: Seated upright at 90 degrees;Remain upright for at least 30 minutes after po intake   Discharge instructions   Complete by:  As directed    Keep foley in place, urology follow up in one week for voiding trial.  Can resume anticoagulation once hematuria resolves.   Increase activity slowly   Complete by:  As directed      Allergies as of 04/30/2017   No Known Allergies     Medication List    STOP taking these medications   senna 8.6 MG  Tabs tablet Commonly known as:  SENOKOT     TAKE these medications   amoxicillin-clavulanate 875-125 MG tablet Commonly known as:  AUGMENTIN Take 1 tablet by mouth every 12 (twelve) hours for 3 days.   cholecalciferol 1000 units tablet Commonly known as:  VITAMIN D Take 1,000 Units by mouth daily. Reported on 10/28/2015   collagenase ointment Commonly known as:   SANTYL Apply topically daily.   DECUBI-VITE Caps Take 2 capsules by mouth at bedtime.   diclofenac sodium 1 % Gel Commonly known as:  VOLTAREN Apply 2 g topically 4 (four) times daily. To both knees   feeding supplement (PRO-STAT SUGAR FREE 64) Liqd Take 30 mLs by mouth 2 (two) times daily.   gabapentin 300 MG capsule Commonly known as:  NEURONTIN Take 3 capsules (900 mg total) by mouth 3 (three) times daily.   linagliptin 5 MG Tabs tablet Commonly known as:  TRADJENTA Take 5 mg by mouth daily.   Melatonin 3 MG Tabs Take 3 mg by mouth at bedtime as needed (sleep).   metFORMIN 500 MG 24 hr tablet Commonly known as:  GLUCOPHAGE-XR Take 1,000 mg by mouth daily with breakfast.   NUTRITIONAL SUPPLEMENT PO House Supplement - Give nutritional treat by mouth with meals daily for caloric and weight support What changed:  Another medication with the same name was removed. Continue taking this medication, and follow the directions you see here.   OLANZapine 10 MG tablet Commonly known as:  ZYPREXA Take 10 mg by mouth at bedtime.   polyethylene glycol packet Commonly known as:  MIRALAX / GLYCOLAX Take 17 g by mouth 2 (two) times daily.   potassium chloride SA 20 MEQ tablet Commonly known as:  K-DUR,KLOR-CON Take 20 mEq by mouth 2 (two) times daily.   rivaroxaban 20 MG Tabs tablet Commonly known as:  XARELTO Take 1 tablet (20 mg total) by mouth at bedtime. 20 mg one tablet by mouth daily at bedtime  May resume in one week if hematuria resolves. Start taking on:  05/07/2017 What changed:    additional instructions  These instructions start on 05/07/2017. If you are unsure what to do until then, ask your doctor or other care provider.   senna-docusate 8.6-50 MG tablet Commonly known as:  Senokot-S Take 1 tablet by mouth 2 (two) times daily.   thiamine 100 MG tablet Take 100 mg by mouth daily.   torsemide 20 MG tablet Commonly known as:  DEMADEX Give 2 tablets (40mg )  by mouth two times daily   traMADol 50 MG tablet Commonly known as:  ULTRAM Take 1 tablet (50 mg total) by mouth every 6 (six) hours.      No Known Allergies Follow-up Information    [facility] Follow up in 1 week(s).   Why:  for urinary retention, hematuria keep foley in until evaluated by urology in one week. Contact information: [address] [place] [address] [place] [address] [phone]           The results of significant diagnostics from this hospitalization (including imaging, microbiology, ancillary and laboratory) are listed below for reference.    Significant Diagnostic Studies: Dg Chest 2 View  Result Date: 04/26/2017 CLINICAL DATA:  Fatigue.  Mental status changes. EXAM: CHEST  2 VIEW COMPARISON:  None. FINDINGS: Moderately degraded lateral view secondary to patient arm position and obliquity. Mildly oblique frontal radiograph. Moderate right hemidiaphragm elevation. Midline trachea. Normal heart size. Atherosclerosis in the transverse aorta. No pleural effusion or  pneumothorax. Mildly low lung volumes. Increased density projecting over the right upper lobe may represent osseous summation shadow and vascular crowding. IMPRESSION: Possible right upper lobe opacity which could represent infection or be secondary to osseous summation shadow and vascular crowding. If possible, consider PA radiograph. If not possible, consider repeat frontal AP radiograph without obliquity. Aortic Atherosclerosis (ICD10-I70.0). Electronically Signed   By: Jeronimo Greaves M.D.   On: 04/26/2017 21:10   Ct Head Wo Contrast  Result Date: 04/26/2017 CLINICAL DATA:  Altered level of consciousness EXAM: CT HEAD WITHOUT CONTRAST TECHNIQUE: Contiguous axial images were obtained from the base of the skull through the vertex without intravenous contrast. COMPARISON:  03/27/2015 FINDINGS: Brain: No acute territorial infarction, hemorrhage or intracranial mass is visualized. Mild  atrophy. Minimal small vessel ischemic changes of the white matter. Stable ventricle size Vascular: No hyperdense vessels.  Carotid artery calcification. Skull: Normal. Negative for fracture or focal lesion. Sinuses/Orbits: No acute finding. Other: None IMPRESSION: No CT evidence for acute intracranial abnormality. Electronically Signed   By: Jasmine Pang M.D.   On: 04/26/2017 21:26   Ct Chest Wo Contrast  Result Date: 04/29/2017 CLINICAL DATA:  Follow-up opacity seen on recent chest x-ray. EXAM: CT CHEST WITHOUT CONTRAST TECHNIQUE: Multidetector CT imaging of the chest was performed following the standard protocol without IV contrast. COMPARISON:  Chest x-ray and April 26, 2017 FINDINGS: Cardiovascular: The heart size is normal. No coronary artery calcifications identified. The thoracic aorta is normal in caliber without significant atherosclerotic change. The central pulmonary arteries are unremarkable. Mediastinum/Nodes: The thyroid and esophagus are normal. No adenopathy in the chest. No pericardial effusion. Lungs/Pleura: Central airways are normal. No pneumothorax. Evaluation of the lungs is limited due to respiratory motion. There is a nodule in the lateral right lung base on series 7, image 63 measuring 6.2 mm. No other nodules or masses. Small bilateral pleural effusions, right greater than left, are identified with mild atelectasis. No CT correlate for the recent chest x-ray finding is noted. This could represent infiltrate which has resolved in the interval or artifact. No evidence of current pneumonia or infiltrate. Upper Abdomen: No acute abnormality. Musculoskeletal: No chest wall mass or suspicious bone lesions identified. IMPRESSION: 1. There is a 6 mm nodule in the right lung base. Non-contrast chest CT at 6-12 months is recommended. If the nodule is stable at time of repeat CT, then future CT at 18-24 months (from today's scan) is considered optional for low-risk patients, but is  recommended for high-risk patients. This recommendation follows the consensus statement: Guidelines for Management of Incidental Pulmonary Nodules Detected on CT Images: From the Fleischner Society 2017; Radiology 2017; 284:228-243. 2. No CT correlate for the recent chest x-ray finding in the right mid lung. 3. No other abnormalities. Electronically Signed   By: Gerome Sam III M.D   On: 04/29/2017 14:48   Ct Abdomen Pelvis W Contrast  Result Date: 04/26/2017 CLINICAL DATA:  Severe abdominal distension. Altered mental status. History of constipation, alcohol abuse, diabetes. EXAM: CT ABDOMEN AND PELVIS WITH CONTRAST TECHNIQUE: Multidetector CT imaging of the abdomen and pelvis was performed using the standard protocol following bolus administration of intravenous contrast. CONTRAST:  ISOVUE-300 IOPAMIDOL (ISOVUE-300) INJECTION 61% COMPARISON:  CT abdomen and pelvis August 25, 2015 FINDINGS: LOWER CHEST: RIGHT lung base atelectasis. Included heart size is normal. No pericardial effusion. HEPATOBILIARY: Distended gallbladder with multiple subcentimeter gallstones. Dilated Common bile duct at 13 mm, no choledocholithiasis. No CT findings of acute cholecystitis. Normal liver.  PANCREAS: Atrophic pancreas with multiple coarse calcifications. Unchanged 16 mm peripherally calcified cystic mass in distal body, additional smaller cysts most compatible with pseudocyst. Chronic pancreatic duct dilatation. SPLEEN: Normal. ADRENALS/URINARY TRACT: Kidneys are orthotopic, demonstrating symmetric enhancement. No nephrolithiasis, hydronephrosis or solid renal masses. The unopacified ureters are normal in course and caliber. Delayed imaging through the kidneys demonstrates symmetric prompt contrast excretion within the proximal urinary collecting system. Urinary bladder is distended with lobulated urinary bladder with multiple small diverticula seen with neurogenic bladder, similar to prior CT. Normal adrenal glands.  STOMACH/BOWEL: Small hiatal hernia. Stool distended rectum at 7.9 cm with large amount of retained large bowel stool. Small amount of small bowel feces compatible with chronic stasis. Normal appendix. VASCULAR/LYMPHATIC: Aortoiliac vessels are normal in course and caliber. Mild intimal thickening calcific atherosclerosis. No lymphadenopathy by CT size criteria. REPRODUCTIVE: Status post hysterectomy. OTHER: No intraperitoneal free fluid or free air. MUSCULOSKELETAL: Nonacute. Mild rectus abdominis diastases. 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 Osteopenia. IMPRESSION: 1. Large amount of retained large bowel stool, stool distended rectum associated with fecal impaction. 2. Sequelae of chronic pancreatitis with multiple small pancreatic cysts most compatible with pseudocyst. 3. Cholelithiasis and extra hepatic biliary dilatation without choledocholithiasis or CT findings of acute cholecystitis. 4. Distended urinary bladder seen with neurogenic bladder/bladder outlet obstruction. Recommend correlation with voiding. 5. 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 Aortic Atherosclerosis (ICD10-I70.0). Electronically Signed   By: Awilda Metroourtnay  Bloomer M.D.   On: 04/26/2017 23:35    Microbiology: Recent Results (from the past 240 hour(s))  Culture, blood (Routine X 2) w Reflex to ID Panel     Status: None (Preliminary result)   Collection Time: 04/26/17  7:55 PM  Result Value Ref Range Status   Specimen Description BLOOD LEFT ARM  Final   Special Requests   Final    BOTTLES DRAWN AEROBIC AND ANAEROBIC Blood Culture adequate volume   Culture   Final    NO GROWTH 4 DAYS Performed at [facility], [address] [place], [place] [address]    Report Status PENDING  Incomplete  Culture, blood (Routine X 2) w Reflex to ID Panel     Status: None (Preliminary result)   Collection Time: 04/26/17  8:10 PM  Result Value Ref Range  Status   Specimen Description BLOOD RIGHT ARM  Final   Special Requests   Final    BOTTLES DRAWN AEROBIC AND ANAEROBIC Blood Culture adequate volume   Culture   Final    NO GROWTH 4 DAYS Performed at [facility], [address] [place], [place] [address]    Report Status PENDING  Incomplete  Urine Culture     Status: Abnormal   Collection Time: 04/27/17 12:18 AM  Result Value Ref Range Status   Specimen Description URINE, RANDOM  Final   Special Requests NONE  Final   Culture >=100,000 COLONIES/mL ESCHERICHIA COLI (A)  Final   Report Status 04/29/2017 FINAL  Final   Organism ID, Bacteria ESCHERICHIA COLI (A)  Final      Susceptibility   Escherichia coli - MIC*    AMPICILLIN 16 INTERMEDIATE Intermediate     CEFAZOLIN <=4 SENSITIVE Sensitive     CEFTRIAXONE <=1 SENSITIVE Sensitive     CIPROFLOXACIN <=0.25 SENSITIVE Sensitive     GENTAMICIN 2 SENSITIVE Sensitive     IMIPENEM <=0.25 SENSITIVE Sensitive     NITROFURANTOIN <=16 SENSITIVE Sensitive  TRIMETH/SULFA >=320 RESISTANT Resistant     AMPICILLIN/SULBACTAM 4 SENSITIVE Sensitive     PIP/TAZO <=4 SENSITIVE Sensitive     Extended ESBL NEGATIVE Sensitive     * >=100,000 COLONIES/mL ESCHERICHIA COLI  MRSA PCR Screening     Status: None   Collection Time: 04/27/17 12:56 AM  Result Value Ref Range Status   MRSA by PCR NEGATIVE NEGATIVE Final    Comment:        The GeneXpert MRSA Assay (FDA approved for NASAL specimens only), is one component of a comprehensive MRSA colonization surveillance program. It is not intended to diagnose MRSA infection nor to guide or monitor treatment for MRSA infections.      Labs: Basic Metabolic Panel: Recent Labs  Lab 04/26/17 1937 04/26/17 2207 04/27/17 0601 04/28/17 0253 04/29/17 0615 04/30/17 0547  NA 139  --  141 144 142 144  K 3.3*  --  3.6 3.5 3.2* 3.0*  CL 96*  --  106 116* 112* 112*  CO2 27  --  27 25 21* 23  GLUCOSE 149*  --  119* 110* 101* 110*  BUN 24*  --   17 7 <5* <5*  CREATININE 0.99  --  0.81 0.61 0.63 0.63  CALCIUM 9.6  --  8.7* 8.6* 8.6* 8.6*  MG  --  1.7  --  1.8  --  1.8   Liver Function Tests: Recent Labs  Lab 04/26/17 04/26/17 1937 04/28/17 0253  AST 22 36 22  ALT 23 24 21   ALKPHOS 92 84 57  BILITOT  --  0.5 0.5  PROT  --  8.8* 5.9*  ALBUMIN  --  3.7 2.7*   Recent Labs  Lab 04/28/17 0253  LIPASE 16   Recent Labs  Lab 04/26/17 1937 04/28/17 0253  AMMONIA 18 14   CBC: Recent Labs  Lab 04/26/17  04/27/17 0130 04/27/17 0601 04/28/17 0253 04/29/17 0615 04/30/17 0901  WBC 16.9   < > 14.7* 13.5* 12.9* 10.0 10.5  NEUTROABS 12  --  8.7*  --   --   --  7.7  HGB 12.4   < > 9.7* 10.1* 8.7* 8.6* 9.4*  HCT 39   < > 29.3* 30.9* 26.5* 26.5* 28.2*  MCV  --    < > 86.7 87.5 86.9 86.6 85.7  PLT 290   < > 229 258 206 269 282   < > = values in this interval not displayed.   Cardiac Enzymes: No results for input(s): CKTOTAL, CKMB, CKMBINDEX, TROPONINI in the last 168 hours. BNP: BNP (last 3 results) Recent Labs    04/26/17 2210  BNP 52.2    ProBNP (last 3 results) No results for input(s): PROBNP in the last 8760 hours.  CBG: Recent Labs  Lab 04/29/17 1134 04/29/17 1643 04/29/17 2058 04/30/17 0721 04/30/17 1204  GLUCAP 180* 91 212* 116* 148*       Signed:  Albertine Grates MD, PhD  [facility] 04/30/2017, 1:30 PM

## 2017-04-30 NOTE — Progress Notes (Signed)
Date: April 30, 2017 Chart review for discharge needs:  None found for case management. Patient has no questions concerning post hospital care. 

## 2017-04-30 NOTE — NC FL2 (Signed)
[place] MEDICAID FL2 LEVEL OF CARE SCREENING TOOL     IDENTIFICATION  Patient Name: Sarah Mckay Birthdate: 02/05/1956 Sex: female Admission Date (Current Location): 04/26/2017  [facility] [place]   Facility and Address:  [facility],  [address], [place] [id number]      Provider Number: [id number]  Attending Physician Name and Address:  Albertine GratesXu, Fang, MD  Relative Name and Phone Number:       Current Level of Care: Hospital Recommended Level of Care: Skilled Nursing Facility Prior Approval Number:    Date Approved/Denied:   PASRR Number: [id number]  Discharge Plan: SNF    Current Diagnoses: Patient Active Problem List   Diagnosis Date Noted  . Pressure injury of skin 04/29/2017  . Vascular dementia without behavioral disturbance   . CHF (congestive heart failure) (HCC) 04/27/2017  . Acute metabolic encephalopathy 04/26/2017  . Aspiration pneumonia (HCC) 04/26/2017  . Abdominal distension 04/26/2017  . Weight loss, abnormal 02/25/2017  . Upper leg DVT (deep venous thromboembolism), chronic, left (HCC) 10/19/2016  . 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 Bilateral lower extremity edema 12/21/2015  . Sepsis (HCC) 09/16/2015  . Anxiety 09/16/2015  . Chronic constipation 08/26/2015  . Anemia 08/26/2015  . UTI (urinary tract infection) 08/26/2015  . Polyneuropathy due to type 2 diabetes mellitus (HCC) 03/25/2015  . Diabetes mellitus with neurological manifestations (HCC) 10/22/2014  . Primary osteoarthritis of both knees 06/03/2014  . Chronic pain 05/13/2014  . Alcohol abuse   . Dysphagia   . 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)     Orientation RESPIRATION BLADDER Height & Weight     Self  Normal Incontinent Weight: 147 lb 7.8 oz (66.9 kg) Height:     BEHAVIORAL SYMPTOMS/MOOD NEUROLOGICAL BOWEL NUTRITION STATUS      Continent Diet(See DC summary)  AMBULATORY STATUS COMMUNICATION OF NEEDS Skin Reason for  Consult:Unstageable PrI to sacrum Wound type:Pressure Pressure Injury POA: Yes Measurement:2cm x 1.2cm with depth unable to be determine due to the presence of yellow fibrinous slough that is firmly adherent  Wound bed:As described above Drainage (amount, consistency, odor) None Periwound:intact with evidence of previous wound healing Dressing procedure/placement/frequency:I will today being application of an enzymatic debriding agent (collagenase) to the Unstageable pressure injury, simultaneously, I will be using turning and repositioning to offload the affected area.  Left LE with resolving edema, but bilateral heels are at risk for pressure injury, so I will provide pressure redistribution heel boots. Patient prefers the supine position,  She is educated about this being deleterious to her sacral ulcer and indicates understanding.     Extensive Assist Verbally PU Stage and Appropriate Care                       Personal Care Assistance Level of Assistance  Bathing, Feeding, Dressing Bathing Assistance: Limited assistance Feeding assistance: Independent Dressing Assistance: Limited assistance     Functional Limitations Info  Sight, Hearing, Speech Sight Info: Adequate Hearing Info: Adequate Speech Info: Adequate    SPECIAL CARE FACTORS FREQUENCY                       Contractures Contractures Info: Not present    Additional Factors Info  Code Status, Allergies Code Status Info: Full Allergies Info: NKA           Current Medications (04/30/2017):  This is the current hospital active medication list Current Facility-Administered Medications  Medication  Dose Route Frequency Provider Last Rate Last Dose  . acetaminophen (TYLENOL) suppository 650 mg  650 mg Rectal Q6H PRN Lorretta HarpNiu, Xilin, MD      . amoxicillin-clavulanate (AUGMENTIN) 875-125 MG per tablet 1 tablet  1 tablet Oral Q12H Albertine GratesXu, Fang, MD      . collagenase (SANTYL) ointment   Topical Daily Albertine GratesXu, Fang, MD       . haloperidol lactate (HALDOL) injection 0.5 mg  0.5 mg Intravenous Q6H PRN Albertine GratesXu, Fang, MD      . insulin aspart (novoLOG) injection 0-5 Units  0-5 Units Subcutaneous QHS Lorretta HarpNiu, Xilin, MD   2 Units at 04/29/17 2230  . insulin aspart (novoLOG) injection 0-9 Units  0-9 Units Subcutaneous TID WC Lorretta HarpNiu, Xilin, MD   1 Units at 04/30/17 1315  . LORazepam (ATIVAN) injection 0.5 mg  0.5 mg Intravenous Q6H PRN Lorretta HarpNiu, Xilin, MD   0.5 mg at 04/29/17 1152  . LORazepam (ATIVAN) injection 1 mg  1 mg Intravenous Q2H PRN Lorretta HarpNiu, Xilin, MD   1 mg at 04/28/17 [id number]  . morphine 4 MG/ML injection 1 mg  1 mg Intravenous Q3H PRN Lorretta HarpNiu, Xilin, MD   1 mg at 04/29/17 [id number]  . nicotine (NICODERM CQ - dosed in mg/24 hours) patch 21 mg  21 mg Transdermal Daily Lorretta HarpNiu, Xilin, MD   21 mg at 04/30/17 1001  . nystatin (MYCOSTATIN) 100000 UNIT/ML suspension 500,000 Units  5 mL Oral QID Albertine GratesXu, Fang, MD   500,000 Units at 04/30/17 1002  . OLANZapine (ZYPREXA) tablet 10 mg  10 mg Oral QHS Albertine GratesXu, Fang, MD      . ondansetron [facility](ZOFRAN) injection 4 mg  4 mg Intravenous Q8H PRN Lorretta HarpNiu, Xilin, MD   4 mg at 04/29/17 1150  . polyethylene glycol (MIRALAX / GLYCOLAX) packet 17 g  17 g Oral Daily Albertine GratesXu, Fang, MD   17 g at 04/30/17 1002  . potassium chloride 20 MEQ/15ML (10%) solution 40 mEq  40 mEq Oral Once Albertine GratesXu, Fang, MD      . senna-docusate (Senokot-S) tablet 1 tablet  1 tablet Oral BID Albertine GratesXu, Fang, MD   1 tablet at 04/30/17 1002  . sorbitol, milk of mag, mineral oil, glycerin (SMOG) enema  960 mL Rectal Once Albertine GratesXu, Fang, MD      . thiamine (VITAMIN B-1) tablet 100 mg  100 mg Oral Daily Albertine GratesXu, Fang, MD   100 mg at 04/30/17 1002     Discharge Medications: STOP taking these medications   senna 8.6 MG Tabs tablet Commonly known as:  SENOKOT     TAKE these medications   amoxicillin-clavulanate 875-125 MG tablet Commonly known as:  AUGMENTIN Take 1 tablet by mouth every 12 (twelve) hours for 3 days.   cholecalciferol 1000 units tablet Commonly known as:  VITAMIN D Take  1,000 Units by mouth daily. Reported on 10/28/2015   collagenase ointment Commonly known as:  SANTYL Apply topically daily.   DECUBI-VITE Caps Take 2 capsules by mouth at bedtime.   diclofenac sodium 1 % Gel Commonly known as:  VOLTAREN Apply 2 g topically 4 (four) times daily. To both knees   feeding supplement (PRO-STAT SUGAR FREE 64) Liqd Take 30 mLs by mouth 2 (two) times daily.   gabapentin 300 MG capsule Commonly known as:  NEURONTIN Take 3 capsules (900 mg total) by mouth 3 (three) times daily.   linagliptin 5 MG Tabs tablet Commonly known as:  TRADJENTA Take 5 mg by mouth daily.   Melatonin 3 MG Tabs Take  3 mg by mouth at bedtime as needed (sleep).   metFORMIN 500 MG 24 hr tablet Commonly known as:  GLUCOPHAGE-XR Take 1,000 mg by mouth daily with breakfast.   NUTRITIONAL SUPPLEMENT PO House Supplement - Give nutritional treat by mouth with meals daily for caloric and weight support What changed:  Another medication with the same name was removed. Continue taking this medication, and follow the directions you see here.   OLANZapine 10 MG tablet Commonly known as:  ZYPREXA Take 10 mg by mouth at bedtime.   polyethylene glycol packet Commonly known as:  MIRALAX / GLYCOLAX Take 17 g by mouth 2 (two) times daily.   potassium chloride SA 20 MEQ tablet Commonly known as:  K-DUR,KLOR-CON Take 20 mEq by mouth 2 (two) times daily.   rivaroxaban 20 MG Tabs tablet Commonly known as:  XARELTO Take 1 tablet (20 mg total) by mouth at bedtime. 20 mg one tablet by mouth daily at bedtime  May resume in one week if hematuria resolves. Start taking on:  05/07/2017 What changed:    additional instructions  These instructions start on 05/07/2017. If you are unsure what to do until then, ask your doctor or other care provider.   senna-docusate 8.6-50 MG tablet Commonly known as:  Senokot-S Take 1 tablet by mouth 2 (two) times daily.   thiamine 100 MG  tablet Take 100 mg by mouth daily.   torsemide 20 MG tablet Commonly known as:  DEMADEX Give 2 tablets (40mg ) by mouth two times daily   traMADol 50 MG tablet Commonly known as:  ULTRAM Take 1 tablet (50 mg total) by mouth every 6 (six) hours.      Relevant Imaging Results:  Relevant Lab Results:   Additional Information SSN: [id number]  Coralyn Helling, LCSW

## 2017-04-30 NOTE — Progress Notes (Signed)
Date:  April 30, 2017 Chart reviewed for concurrent status and case management needs.  Will continue to follow patient progress.  Discharge Planning: following for needs  Expected discharge date: May 03, 2017  Tamar Lipscomb, BSN, RN3, CCM   [phone]  

## 2017-04-30 NOTE — Consult Note (Signed)
WOC Nurse wound consult note Reason for Consult:Unstageable PrI to sacrum Wound type:Pressure Pressure Injury POA: Yes Measurement:2cm x 1.2cm with depth unable to be determine due to the presence of yellow fibrinous slough that is firmly adherent  Wound bed:As described above Drainage (amount, consistency, odor) None Periwound:intact with evidence of previous wound healing Dressing procedure/placement/frequency:I will today being application of an enzymatic debriding agent (collagenase) to the Unstageable pressure injury, simultaneously, I will be using turning and repositioning to offload the affected area.  Left LE with resolving edema, but bilateral heels are at risk for pressure injury, so I will provide pressure redistribution heel boots. Patient prefers the supine position,  She is educated about this being deleterious to her sacral ulcer and indicates understanding. WOC nursing team will not follow, but will remain available to this patient, the nursing and medical teams.  Please re-consult if needed. Thanks, Ladona MowLaurie Darria Corvera, MSN, RN, GNP, Hans EdenCWOCN, CWON-AP, FAAN  Pager# [phone]

## 2017-04-30 NOTE — Progress Notes (Signed)
LCSW following for return to facility.  No formal consult. [facility] and has been dc'd.  LCSW confirmed return with patient and facility.  Patient will transport by [facility].   LCSW attempted to reach brother, Berna SpareMarcus by phone with no success.   LCSW faxed dc paper via hub.  RN report # [phone]  LCSW signing off. No further CSW needs.   Beulah GandyBernette Ardythe Klute, LSCW [facility] [phone]

## 2017-05-01 ENCOUNTER — Encounter: Payer: Self-pay | Admitting: Adult Health

## 2017-05-01 ENCOUNTER — Non-Acute Institutional Stay (SKILLED_NURSING_FACILITY): Payer: Medicaid Other | Admitting: Adult Health

## 2017-05-01 DIAGNOSIS — R339 Retention of urine, unspecified: Secondary | ICD-10-CM | POA: Diagnosis not present

## 2017-05-01 DIAGNOSIS — F1027 Alcohol dependence with alcohol-induced persisting dementia: Secondary | ICD-10-CM | POA: Diagnosis not present

## 2017-05-01 DIAGNOSIS — F10959 Alcohol use, unspecified with alcohol-induced psychotic disorder, unspecified: Secondary | ICD-10-CM

## 2017-05-01 DIAGNOSIS — M170 Bilateral primary osteoarthritis of knee: Secondary | ICD-10-CM

## 2017-05-01 DIAGNOSIS — I825Y2 Chronic embolism and thrombosis of unspecified deep veins of left proximal lower extremity: Secondary | ICD-10-CM | POA: Diagnosis not present

## 2017-05-01 DIAGNOSIS — E1142 Type 2 diabetes mellitus with diabetic polyneuropathy: Secondary | ICD-10-CM | POA: Diagnosis not present

## 2017-05-01 DIAGNOSIS — K5909 Other constipation: Secondary | ICD-10-CM | POA: Diagnosis not present

## 2017-05-01 LAB — CULTURE, BLOOD (ROUTINE X 2)
CULTURE: NO GROWTH
Culture: NO GROWTH
SPECIAL REQUESTS: ADEQUATE
Special Requests: ADEQUATE

## 2017-05-01 NOTE — Progress Notes (Signed)
Location:   [facility] Room Number: 204 B Place of Service:  SNF (31)   CODE STATUS: Full Code  No Known Allergies  Chief Complaint  Patient presents with  . Hospitalization Follow-up    Hospital Follow up    HPI:  She is a 61 year old long term resident of this facility being seen after being hospitalized for sepsis with encephalopathy; acute urinary rentention and questionable aspiration pneumonia. She does have a foley in place due to her retention and will need to follow up with urology. She will need to complete her abt.  Her ct scan of abdomen showed constipationShe will continue to be followed for her chronic illnesses: including: dvt; dementia due to alcohol; and chf. She is unable to fully participate in the hpi or ros. There are no reports of uncontrolled pain; behavioral issues or sleep disturbance. There are no nursing concerns at this time.   Past Medical History:  Diagnosis Date  . Acute deep vein thrombosis (DVT) of left femoral vein (HCC) 02/22/2015  . Alcohol abuse   . Arthritis   . Chronic constipation 08/26/2015  . Dementia with behavioral problem   . Diabetes mellitus without complication (HCC)   . Dysphagia    and aspiration risk  . 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 Seizures (HCC)     History reviewed. No pertinent surgical history.  Social History   Socioeconomic History  . Marital status: Unknown    Spouse name: Not on file  . Number of children: Not on file  . Years of education: Not on file  . Highest education level: Not on file  Social Needs  . Financial resource strain: Not on file  . Food insecurity - worry: Not on file  . Food insecurity - inability: Not on file  . Transportation needs - medical: Not on file  . Transportation needs - non-medical: Not on file  Occupational History  . Not on file  Tobacco Use  . Smoking status: Current Every Day Smoker    Packs/day: 0.50    Years: 15.00    Pack years: 7.50    Types:  Cigarettes  . Smokeless tobacco: Never Used  Substance and Sexual Activity  . Alcohol use: Yes    Alcohol/week: 1.2 oz    Types: 2 Cans of beer per week    Comment: occ  . Drug use: No  . Sexual activity: Not Currently  Other Topics Concern  . Not on file  Social History Narrative  . Not on file   History reviewed. No pertinent family history.    VITAL SIGNS BP 110/62   Pulse (!) 102   Temp 97.9 F (36.6 C)   Resp 14   Ht 5\' 3"  (1.6 m)   Wt 141 lb 14.4 oz (64.4 kg)   LMP 03/25/2015 (Approximate)   SpO2 95%   BMI 25.14 kg/m   Outpatient Encounter Medications as of 05/01/2017  Medication Sig  . Amino Acids-Protein Hydrolys (FEEDING SUPPLEMENT, PRO-STAT SUGAR FREE 64,) LIQD Take 30 mLs by mouth 2 (two) times daily.  Marland Kitchen. amoxicillin-clavulanate (AUGMENTIN) 875-125 MG tablet Take 1 tablet by mouth every 12 (twelve) hours for 3 days.  Marland Kitchen. CALCIUM ALGINATE EX Apply to coccyx wound daily  . cholecalciferol (VITAMIN D) 1000 UNITS tablet Take 1,000 Units by mouth daily. Reported on 10/28/2015  . diclofenac sodium (VOLTAREN) 1 % GEL Apply 2 g topically 4 (four) times daily. To both knees  . gabapentin (NEURONTIN) 300  MG capsule Take 3 capsules (900 mg total) by mouth 3 (three) times daily.  Marland Kitchen linagliptin (TRADJENTA) 5 MG TABS tablet Take 5 mg by mouth daily.  . Melatonin 3 MG TABS Take 3 mg by mouth at bedtime as needed (sleep).   . metFORMIN (GLUCOPHAGE-XR) 500 MG 24 hr tablet Take 1,000 mg by mouth daily with breakfast.   . Multiple Vitamins-Minerals (DECUBI-VITE) CAPS Take 2 capsules by mouth at bedtime.  . Nutritional Supplements (NUTRITIONAL SUPPLEMENT PO) House Supplement - Give house supplement with meals daily for caloric and weight support  . OLANZapine (ZYPREXA) 10 MG tablet Take 10 mg by mouth at bedtime.  . polyethylene glycol (MIRALAX / GLYCOLAX) packet Take 17 g by mouth 2 (two) times daily.  . potassium chloride SA (K-DUR,KLOR-CON) 20 MEQ tablet Take 20 mEq by mouth 2 (two)  times daily.   Melene Muller ON 05/07/2017] rivaroxaban (XARELTO) 20 MG TABS tablet Take 1 tablet (20 mg total) by mouth at bedtime. 20 mg one tablet by mouth daily at bedtime  May resume in one week if hematuria resolves.  . senna-docusate (SENOKOT-S) 8.6-50 MG tablet Take 1 tablet by mouth 2 (two) times daily.  Marland Kitchen thiamine 100 MG tablet Take 100 mg by mouth daily.  Marland Kitchen torsemide (DEMADEX) 20 MG tablet Give 2 tablets (40mg ) by mouth two times daily  . traMADol (ULTRAM) 50 MG tablet Take 1 tablet (50 mg total) by mouth every 6 (six) hours.  . [DISCONTINUED] collagenase (SANTYL) ointment Apply topically daily. (Patient not taking: Reported on 05/01/2017)   No facility-administered encounter medications on file as of 05/01/2017.      SIGNIFICANT DIAGNOSTIC EXAMS  PREVIOUS  10-02-14: left leg doppler: +dvt  10-08-14: bilateral hip /pelvic fracture: right hip with marked osteoarthritis  04-23-15: right distal ulna and forearm: no acute fracture   10-28-15: left lower extremity doppler: negative for dvt  08-08-16: left lower extremity doppler: + DVT  01-10-17: chest x-ray: no discernable pneumonia or congestive heart failure  01-18-17: 2-d echo: EF 55-60%; left ventricle hypertrophy; mild relaxation abnormality   02-28-17: KUB: constipation  TODAY:   04-26-17: chest x-ray: Possible right upper lobe opacity which could represent infection or be secondary to osseous summation shadow and vascular crowding. If possible, consider PA radiograph. If not possible, consider repeat frontal AP radiograph without obliquity. Aortic Atherosclerosis  04-26-17: ct of abdomen and pelvis: 1. Large amount of retained large bowel stool, stool distended rectum associated with fecal impaction. 2. Sequelae of chronic pancreatitis with multiple small pancreatic cysts most compatible with pseudocyst. 3. Cholelithiasis and extra hepatic biliary dilatation without choledocholithiasis or CT findings of acute cholecystitis. 4.  Distended urinary bladder seen with neurogenic bladder/bladder outlet obstruction. Recommend correlation with voiding. 5. 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 Aortic Atherosclerosis  04-26-17: ct of head: No CT evidence for acute intracranial abnormality.   04-28-17: 2-d echo:   - Left ventricle: The cavity size was mildly dilated. Wall thickness was normal. Systolic function was severely reduced. The estimated ejection fraction was in the range of 20% to 25%. Severe diffuse hypokinesis with regional variations. Possible disproportionately severe hypokinesis of the inferolateral, inferior, and inferoseptal myocardium. Features are consistent with a pseudonormal left ventricular filling pattern, with concomitant abnormal relaxation and increased filling pressure (grade 2 diastolic dysfunction).  04-29-17: ct of chest: 1. There is a 6 mm nodule in the right lung base. Non-contrast chest CT at 6-12 months is recommended. If the nodule is stable  at time of repeat CT, then future CT at 18-24 months (from today's scan) is considered optional for low-risk patients, but is recommended for high-risk patients. :  2. No CT correlate for the recent chest x-ray finding in the right mid lung. 3. No other abnormalities.   LABS REVIEWED: PREVIOUS     07-27-16: hgb a1c 7.1; chol 153; ldl 66; trig 164; hdl 55  1-61-09: wbc 12.1; hgb 11.1; hct 34.8; mcv 86.6; plt 282; glucose 106; bun 14.0; creat 0.55; k+ 3.8; na++ 140; ca 10.1; liver normal albumin 4.4  01-04-17: wbc 10.7; hgb 12.3; hct 37.3; mcv 83.6; plt 227; glucose 120; bun 13.6; creat 0.58; k+ 3.7; na++ 140; ca 9.4  01-11-17: BNP 339.10 02-16-17: tsh 1.39; vit B 12: 1092; folic 17.6  03-21-17: hgb a1c 7.8   TODAY:   04-26-17: wbc 18.5; hgb 11.9; hct 35.9; mcv 86.;3 plt 297; glucose 149; bun 24;creat 0.99; k+ 3.3; na++ 139; ca 9.6; liver normal albumin 3.7; ammonia 18; blood culture: no growth 04-27-17: urine culture:  e-coli 04-28-17: wbc 12.9; hgb 8.7; hct 26.5; mcv 86.9; plt 206; glucose 110; bun 7; creat 0.61; k+ 3.5; na++ 144; ca 8.6; liver normal albumin 2.7 04-30-17: glucose 110; bun <5; creat 0.63; k+ 3.0; na++ 144; ca 8.6; mag 1.8; vit B 12: 1282; folate 33.0; iron 43; tibc 235.          Review of Systems  Unable to perform ROS: Dementia (confused)   Physical Exam  Constitutional: No distress.  Frail   Cardiovascular: Normal rate, regular rhythm, normal heart sounds and intact distal pulses.  Pedal pulses faint   Pulmonary/Chest: Effort normal and breath sounds normal.  Abdominal: Soft. Bowel sounds are normal. She exhibits no distension. There is no tenderness.  Genitourinary:  Genitourinary Comments: Has foley   Musculoskeletal: She exhibits edema.  Able to move all extremities  Leans to left in wheelchair Has 2+ lower extremity edema bilaterally with the left worse than right   Legs are tender to touch       Neurological: She is alert.  Skin: Skin is warm and dry. She is not diaphoretic.  Psychiatric: She has a normal mood and affect.   ASSESSMENT/ PLAN:   TODAY   1. Diabetes: without change  hgb a1c is 7.8 (previous 7.1)  Urine for micro-albumin is 7.4;   Will continue metformin xr  1 gm daily;  Will begin tradjenta 5 mg daily and will monitor  is not on statin her ldl is 66; is not on ace/arb   2 .Chronic pain with osteoarthritis of both knees: she is  getting adequate pain relief: will continue voltaren gel 2 gm to both knees four times daily; will continue neurontin  900 mg three times daily; and will continue ultram  50 mg every 6 hours routinely    3. Psychosis; due to alcohol: she is presently without change in status; will continue zyprexa 10 mg daily; this dose was adjusted by psych services in Oct 2018 thiamine and folic acid daily. She is not on a prn benzo at this time.   4. 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 Her current weight is 141 pounds her weight in July 2018: 154 pounds with this disease process weight loss is an expected outcome.   5. Weight loss: her current weight is 141; she has completed her remeron therapy.  Her weight is stable   6. DVT left leg:  (08-08-16; #2) stable  will continue xarelto 20 mg nightly she is on long term therapy  will monitor this at this time.      7. Bilateral lower extremity edema: no change in status:  will continue demadex  20 mg twice daily with k+ 20 meq daily   8. Constipation:  Worse will continue senna twice daily and will begin linzess 72 mcg daily   9. Anemia: hgb 8.7; worse; will get fobt  10. Urine retention: has foley will have her follow up with urology   11. Right lung nodule: 6 mm will repeat ct scan of chest in June 2019  12. Chronic pancreatitis:  Sequelae of chronic pancreatitis with multiple small pancreatic cysts most compatible with pseudocyst. Will not make changes  13. Sepsis: will complete abt and will monitor   Will get cbc; cmp   MD is aware of resident's narcotic use and is in agreement with current plan of care. We will attempt to wean resident as apropriate     Synthia Innocenteborah Lamiyah Schlotter NP [facility]  Contact [phone] Monday through Friday 8am- 5pm  After hours call [phone]

## 2017-05-02 ENCOUNTER — Telehealth: Payer: Self-pay

## 2017-05-02 LAB — CBC AND DIFFERENTIAL
HEMATOCRIT: 35 — AB (ref 36–46)
Hemoglobin: 11.9 — AB (ref 12.0–16.0)
Platelets: 453 — AB (ref 150–399)
WBC: 12.4

## 2017-05-02 LAB — HEPATIC FUNCTION PANEL
ALT: 63 — AB (ref 7–35)
AST: 62 — AB (ref 13–35)
Alkaline Phosphatase: 103 (ref 25–125)
Bilirubin, Total: 0.2

## 2017-05-02 LAB — BASIC METABOLIC PANEL
BUN: 8 (ref 4–21)
CREATININE: 0.6 (ref 0.5–1.1)
GLUCOSE: 120
POTASSIUM: 4.2 (ref 3.4–5.3)
SODIUM: 141 (ref 137–147)

## 2017-05-02 NOTE — Telephone Encounter (Signed)
Possible re-admission to facility. This is a patient you were seeing at [facility]. TOC - Hospital F/U is needed if patient was re-admitted to facility upon discharge. Hospital discharge from [facility] on 04/30/2017  

## 2017-05-03 ENCOUNTER — Non-Acute Institutional Stay (SKILLED_NURSING_FACILITY): Payer: Medicaid Other | Admitting: Internal Medicine

## 2017-05-03 ENCOUNTER — Encounter: Payer: Self-pay | Admitting: Adult Health

## 2017-05-03 ENCOUNTER — Encounter: Payer: Self-pay | Admitting: Internal Medicine

## 2017-05-03 DIAGNOSIS — E1142 Type 2 diabetes mellitus with diabetic polyneuropathy: Secondary | ICD-10-CM | POA: Diagnosis not present

## 2017-05-03 DIAGNOSIS — G8929 Other chronic pain: Secondary | ICD-10-CM | POA: Diagnosis not present

## 2017-05-03 DIAGNOSIS — R600 Localized edema: Secondary | ICD-10-CM | POA: Diagnosis not present

## 2017-05-03 DIAGNOSIS — R339 Retention of urine, unspecified: Secondary | ICD-10-CM | POA: Diagnosis not present

## 2017-05-03 DIAGNOSIS — F1027 Alcohol dependence with alcohol-induced persisting dementia: Secondary | ICD-10-CM

## 2017-05-03 DIAGNOSIS — R945 Abnormal results of liver function studies: Secondary | ICD-10-CM | POA: Diagnosis not present

## 2017-05-03 DIAGNOSIS — F10959 Alcohol use, unspecified with alcohol-induced psychotic disorder, unspecified: Secondary | ICD-10-CM | POA: Diagnosis not present

## 2017-05-03 DIAGNOSIS — I825Y2 Chronic embolism and thrombosis of unspecified deep veins of left proximal lower extremity: Secondary | ICD-10-CM | POA: Diagnosis not present

## 2017-05-03 DIAGNOSIS — D649 Anemia, unspecified: Secondary | ICD-10-CM

## 2017-05-03 DIAGNOSIS — R911 Solitary pulmonary nodule: Secondary | ICD-10-CM | POA: Diagnosis not present

## 2017-05-03 DIAGNOSIS — R7989 Other specified abnormal findings of blood chemistry: Secondary | ICD-10-CM

## 2017-05-03 NOTE — Progress Notes (Deleted)
Provider:  DR Elmon KirschnerMONICA S CARTER Location:  [facility] Room Number: 204 B Place of Service:  SNF (31)  PCP: Kirt Boysarter, Monica, DO Patient Care Team: Kirt Boysarter, Monica, DO as PCP - General (Internal Medicine) Chilton SiGreen, Chong Sicilianeborah S, NP as Nurse Practitioner (Nurse Practitioner) Center[facility] (Skilled Nursing Facility)  Extended Emergency Contact Information Primary Emergency Contact: Rock[place],Marcus Address: [address]          [place], [place] [address] [place] Home Phone: [phone] Mobile Phone: [phone] Relation: Brother Secondary Emergency Contact: Dewaine Oatsarpenter,Marie  [place] Home Phone: [phone] Relation: Niece  Code Status: *** Goals of Care: Advanced Directive information Advanced Directives 05/03/2017  Does Patient Have a Medical Advance Directive? Yes  Type of Advance Directive Out of facility DNR (pink MOST or yellow form)  Does patient want to make changes to medical advance directive? No - Patient declined  Copy of Healthcare Power of Attorney in Chart? -  Would patient like information on creating a medical advance directive? -  Pre-existing out of facility DNR order (yellow form or pink MOST form) Pink MOST form placed in chart (order not valid for inpatient use)      Chief Complaint  Patient presents with  . Readmit To SNF    Readmit to [facility]    HPI: Patient is a 61 y.o. female seen today for admission to  Past Medical History:  Diagnosis Date  . Acute deep vein thrombosis (DVT) of left femoral vein (HCC) 02/22/2015  . Alcohol abuse   . Arthritis   . Chronic constipation 08/26/2015  . Dementia with behavioral problem   . Diabetes mellitus without complication (HCC)   . Dysphagia    and aspiration risk  . 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 Seizures (HCC)    History reviewed. No pertinent surgical history.  reports that she has been smoking cigarettes.  She has a 7.50 pack-year  smoking history. she has never used smokeless tobacco. She reports that she drinks about 1.2 oz of alcohol per week. She reports that she does not use drugs. Social History   Socioeconomic History  . Marital status: Unknown    Spouse name: Not on file  . Number of children: Not on file  . Years of education: Not on file  . Highest education level: Not on file  Social Needs  . Financial resource strain: Not on file  . Food insecurity - worry: Not on file  . Food insecurity - inability: Not on file  . Transportation needs - medical: Not on file  . Transportation needs - non-medical: Not on file  Occupational History  . Not on file  Tobacco Use  . Smoking status: Current Every Day Smoker    Packs/day: 0.50    Years: 15.00    Pack years: 7.50    Types: Cigarettes  . Smokeless tobacco: Never Used  Substance and Sexual Activity  . Alcohol use: Yes    Alcohol/week: 1.2 oz    Types: 2 Cans of beer per week    Comment: occ  . Drug use: No  . Sexual activity: Not Currently  Other Topics Concern  . Not on file  Social History Narrative  . Not on file    Functional Status Survey:    History reviewed. No pertinent family history.  Health Maintenance  Topic Date Due  . URINE MICROALBUMIN  05/18/2017 (Originally 03/23/2017)  . PAP SMEAR  07/26/2017 (Originally [birth year]/04/1979)  . OPHTHALMOLOGY  EXAM  10/19/2017 (Originally 09/11/2017)  . Hepatitis C Screening  10/19/2017 (Originally [birth year])  . HEMOGLOBIN A1C  09/19/2017  . FOOT EXAM  01/09/2018  . PNEUMOCOCCAL POLYSACCHARIDE VACCINE (2) 06/17/2019  . TETANUS/TDAP  01/19/2024  . INFLUENZA VACCINE  Completed  . HIV Screening  Completed    No Known Allergies  Outpatient Encounter Medications as of 05/03/2017  Medication Sig  . Amino Acids-Protein Hydrolys (FEEDING SUPPLEMENT, PRO-STAT SUGAR FREE 64,) LIQD Take 30 mLs by mouth 2 (two) times daily.  Marland Kitchen CALCIUM ALGINATE EX Apply to coccyx wound daily  . cholecalciferol (VITAMIN D)  1000 UNITS tablet Take 1,000 Units by mouth daily. Reported on 10/28/2015  . diclofenac sodium (VOLTAREN) 1 % GEL Apply 2 g topically 4 (four) times daily. To both knees  . gabapentin (NEURONTIN) 300 MG capsule Take 3 capsules (900 mg total) by mouth 3 (three) times daily.  Marland Kitchen linaclotide (LINZESS) 72 MCG capsule Take 72 mcg by mouth daily before breakfast.  . linagliptin (TRADJENTA) 5 MG TABS tablet Take 5 mg by mouth daily.  . Melatonin 3 MG TABS Take 3 mg by mouth at bedtime.   . metFORMIN (GLUCOPHAGE-XR) 500 MG 24 hr tablet Take 1,000 mg by mouth daily with breakfast.   . Multiple Vitamins-Minerals (DECUBI-VITE) CAPS Take 2 capsules by mouth at bedtime.  . Nutritional Supplements (NUTRITIONAL SUPPLEMENT PO) House Supplement - Give house supplement with meals daily for caloric and weight support  . OLANZapine (ZYPREXA) 10 MG tablet Take 10 mg by mouth at bedtime.  . polyethylene glycol (MIRALAX / GLYCOLAX) packet Take 17 g by mouth 2 (two) times daily.  . potassium chloride SA (K-DUR,KLOR-CON) 20 MEQ tablet Take 20 mEq by mouth 2 (two) times daily.   . rivaroxaban (XARELTO) 20 MG TABS tablet Take 20 mg by mouth at bedtime.  . senna-docusate (SENOKOT-S) 8.6-50 MG tablet Take 1 tablet by mouth 2 (two) times daily.  Marland Kitchen thiamine 100 MG tablet Take 100 mg by mouth daily.  Marland Kitchen torsemide (DEMADEX) 20 MG tablet Give 2 tablets (40mg ) by mouth two times daily  . traMADol (ULTRAM) 50 MG tablet Take 1 tablet (50 mg total) by mouth every 6 (six) hours.  . [DISCONTINUED] amoxicillin-clavulanate (AUGMENTIN) 875-125 MG tablet Take 1 tablet by mouth every 12 (twelve) hours for 3 days. (Patient not taking: Reported on 05/03/2017)  . [DISCONTINUED] rivaroxaban (XARELTO) 20 MG TABS tablet Take 1 tablet (20 mg total) by mouth at bedtime. 20 mg one tablet by mouth daily at bedtime  May resume in one week if hematuria resolves. (Patient taking differently: Take 20 mg by mouth at bedtime. )   No facility-administered  encounter medications on file as of 05/03/2017.     Review of Systems  Vitals:   05/03/17 1117  BP: 110/62  Pulse: (!) 102  Resp: 14  Temp: 97.9 F (36.6 C)  TempSrc: Oral  SpO2: (!) 65%  Weight: 141 lb 14.4 oz (64.4 kg)  Height: 5\' 3"  (1.6 m)   Body mass index is 25.14 kg/m. Physical Exam  Labs reviewed: Basic Metabolic Panel: Recent Labs    04/26/17 2207  04/28/17 0253 04/29/17 0615 04/30/17 0547 05/02/17  NA  --    < > 144 142 144 141  K  --    < > 3.5 3.2* 3.0* 4.2  CL  --    < > 116* 112* 112*  --   CO2  --    < > 25 21* 23  --  GLUCOSE  --    < > 110* 101* 110*  --   BUN  --    < > 7 <5* <5* 8  CREATININE  --    < > 0.61 0.63 0.63 0.6  CALCIUM  --    < > 8.6* 8.6* 8.6*  --   MG 1.7  --  1.8  --  1.8  --    < > = values in this interval not displayed.   Liver Function Tests: Recent Labs    04/26/17 1937 04/28/17 0253 05/02/17  AST 36 22 62*  ALT 24 21 63*  ALKPHOS 84 57 103  BILITOT 0.5 0.5  --   PROT 8.8* 5.9*  --   ALBUMIN 3.7 2.7*  --    Recent Labs    04/28/17 0253  LIPASE 16   Recent Labs    04/26/17 1937 04/28/17 0253  AMMONIA 18 14   CBC: Recent Labs    04/26/17  04/27/17 0130  04/28/17 0253 04/29/17 0615 04/30/17 0901 05/02/17  WBC 16.9   < > 14.7*   < > 12.9* 10.0 10.5 12.4  NEUTROABS 12  --  8.7*  --   --   --  7.7  --   HGB 12.4   < > 9.7*   < > 8.7* 8.6* 9.4* 11.9*  HCT 39   < > 29.3*   < > 26.5* 26.5* 28.2* 35*  MCV  --    < > 86.7   < > 86.9 86.6 85.7  --   PLT 290   < > 229   < > 206 269 282 453*   < > = values in this interval not displayed.   Cardiac Enzymes: No results for input(s): CKTOTAL, CKMB, CKMBINDEX, TROPONINI in the last 8760 hours. BNP: Invalid input(s): POCBNP Lab Results  Component Value Date   HGBA1C 7.8 03/21/2017   Lab Results  Component Value Date   TSH 1.39 02/16/2017   Lab Results  Component Value Date   VITAMINB12 1,282 (H) 04/30/2017   Lab Results  Component Value Date   FOLATE  33.0 04/30/2017   Lab Results  Component Value Date   IRON 43 04/30/2017   TIBC 235 (L) 04/30/2017    Imaging and Procedures obtained prior to SNF admission: Dg Chest 2 View  Result Date: 04/26/2017 CLINICAL DATA:  Fatigue.  Mental status changes. EXAM: CHEST  2 VIEW COMPARISON:  None. FINDINGS: Moderately degraded lateral view secondary to patient arm position and obliquity. Mildly oblique frontal radiograph. Moderate right hemidiaphragm elevation. Midline trachea. Normal heart size. Atherosclerosis in the transverse aorta. No pleural effusion or pneumothorax. Mildly low lung volumes. Increased density projecting over the right upper lobe may represent osseous summation shadow and vascular crowding. IMPRESSION: Possible right upper lobe opacity which could represent infection or be secondary to osseous summation shadow and vascular crowding. If possible, consider PA radiograph. If not possible, consider repeat frontal AP radiograph without obliquity. Aortic Atherosclerosis (ICD10-I70.0). Electronically Signed   By: Jeronimo Greaves M.D.   On: 04/26/2017 21:10   Ct Head Wo Contrast  Result Date: 04/26/2017 CLINICAL DATA:  Altered level of consciousness EXAM: CT HEAD WITHOUT CONTRAST TECHNIQUE: Contiguous axial images were obtained from the base of the skull through the vertex without intravenous contrast. COMPARISON:  03/27/2015 FINDINGS: Brain: No acute territorial infarction, hemorrhage or intracranial mass is visualized. Mild atrophy. Minimal small vessel ischemic changes of the white matter. Stable ventricle size Vascular: No hyperdense vessels.  Carotid artery calcification. Skull: Normal. Negative for fracture or focal lesion. Sinuses/Orbits: No acute finding. Other: None IMPRESSION: No CT evidence for acute intracranial abnormality. Electronically Signed   By: Jasmine PangKim  Fujinaga M.D.   On: 04/26/2017 21:26   Ct Abdomen Pelvis W Contrast  Result Date: 04/26/2017 CLINICAL DATA:  Severe abdominal  distension. Altered mental status. History of constipation, alcohol abuse, diabetes. EXAM: CT ABDOMEN AND PELVIS WITH CONTRAST TECHNIQUE: Multidetector CT imaging of the abdomen and pelvis was performed using the standard protocol following bolus administration of intravenous contrast. CONTRAST:  100mL ISOVUE-300 IOPAMIDOL (ISOVUE-300) INJECTION 61% COMPARISON:  CT abdomen and pelvis August 25, 2015 FINDINGS: LOWER CHEST: RIGHT lung base atelectasis. Included heart size is normal. No pericardial effusion. HEPATOBILIARY: Distended gallbladder with multiple subcentimeter gallstones. Dilated Common bile duct at 13 mm, no choledocholithiasis. No CT findings of acute cholecystitis. Normal liver. PANCREAS: Atrophic pancreas with multiple coarse calcifications. Unchanged 16 mm peripherally calcified cystic mass in distal body, additional smaller cysts most compatible with pseudocyst. Chronic pancreatic duct dilatation. SPLEEN: Normal. ADRENALS/URINARY TRACT: Kidneys are orthotopic, demonstrating symmetric enhancement. No nephrolithiasis, hydronephrosis or solid renal masses. The unopacified ureters are normal in course and caliber. Delayed imaging through the kidneys demonstrates symmetric prompt contrast excretion within the proximal urinary collecting system. Urinary bladder is distended with lobulated urinary bladder with multiple small diverticula seen with neurogenic bladder, similar to prior CT. Normal adrenal glands. STOMACH/BOWEL: Small hiatal hernia. Stool distended rectum at 7.9 cm with large amount of retained large bowel stool. Small amount of small bowel feces compatible with chronic stasis. Normal appendix. VASCULAR/LYMPHATIC: Aortoiliac vessels are normal in course and caliber. Mild intimal thickening calcific atherosclerosis. No lymphadenopathy by CT size criteria. REPRODUCTIVE: Status post hysterectomy. OTHER: No intraperitoneal free fluid or free air. MUSCULOSKELETAL: Nonacute. Mild rectus abdominis  diastases. 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 Osteopenia. IMPRESSION: 1. Large amount of retained large bowel stool, stool distended rectum associated with fecal impaction. 2. Sequelae of chronic pancreatitis with multiple small pancreatic cysts most compatible with pseudocyst. 3. Cholelithiasis and extra hepatic biliary dilatation without choledocholithiasis or CT findings of acute cholecystitis. 4. Distended urinary bladder seen with neurogenic bladder/bladder outlet obstruction. Recommend correlation with voiding. 5. 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 Aortic Atherosclerosis (ICD10-I70.0). Electronically Signed   By: Awilda Metroourtnay  Bloomer M.D.   On: 04/26/2017 23:35    Assessment/Plan    Family/ staff Communication:   Labs/tests ordered:    Bertis RuddyMonica S. Ancil Linseyarter, D. O., F. A. C. O. I.  [facility] [address] [place], [place] [address] [phone] Cell (Monday-Friday 8 AM - 5 PM) [phone] After 5 PM and follow prompts

## 2017-05-03 NOTE — Progress Notes (Signed)
Patient ID: Sarah Mckay, female   DOB: 01-May-1956, 60 y.o.   MRN: [id number]  Provider:  DR Arletha Grippe Location:  [facility] Room Number: [place] of Service:  SNF (31)  PCP: Gildardo Cranker, DO Patient Care Team: Gildardo Cranker, DO as PCP - General (Internal Medicine) Nyoka Cowden Phylis Bougie, NP as Nurse Practitioner (Nurse Practitioner) [facility] [place] ([place])  Extended Emergency Contact Information Primary Emergency Contact: Patient,Marcus Address: [address]          [place], [place] [address] [place] Phone: [phone] Mobile Phone: [phone] Relation: Brother Secondary Emergency Contact: Lonni Fix [place] Phone: [phone] Relation: Niece  Code Status: DNR Goals of Care: Advanced Directive information Advanced Directives 05/03/2017  Does Patient Have a Medical Advance Directive? Yes  Type of Advance Directive Out of facility DNR (pink MOST or yellow form)  Does patient want to make changes to medical advance directive? No - Patient declined  Copy of [place] in Chart? -  Would patient like information on creating a medical advance directive? -  Pre-existing out of facility DNR order (yellow form or pink MOST form) Pink MOST form placed in chart (order not valid for inpatient use)      Chief Complaint  Patient presents with  . Readmit To SNF    Readmit to [facility]    HPI: Patient is a 61 y.o. female seen today for readmission to SNF following hospital stay for sepsis 2/2 UTI, abdominal distension 2/2 fecal impaction, pressure injury, RLL 80m  pulmonary nodule, acute metabolic encephalopathy, CHF, vascular dementia, Etoh abuse, psychosis 2/2 Etoh, anemia, DM. She presented to the ED with change in MS. Blood cx neg growth to date. She was given IVF, IV vanco/zosyn-->zosyn -->Augmentin. Bladder scan revealed 555 cc -->foley cath placed.  Urology consulted. Urine cx (+) E coli. CT chest neg PNA. Her anticoagulant was stopped 2/2 gross hematuria. EF 20-25% on 2D echo. K 3; albumin 2.7; alk phos 57 at d/c. She presents to SNF for long term care.  Today she has no concerns. Appetite ok and sleeps well. No nursing issues. No falls. She is a poor historian due to dementia and psych d/o. Hx obtained from chart. SNF labs reveal Cr 0.57; Hgb 11.9; Plts 453K; WBC 12.4K with abs Neutrophils 7.1K; ALT 63 (prev 21); AST 62 (prev 22); albumin 4.3; alk phos 103.  DM - borderline controlled. A1c 7.8% (previous 7.1%);  Urine for micro-albumin 7.4. Takes metformin xr  1 gm daily; tradjenta 5 mg daily. LDL 66 without statin. 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 She gets nutritional supplements per facility protocol  DVT left leg - dx 08-08-16 (#2) stable on xarelto 20 mg nightly long term  Bilateral lower extremity edema - stable on demadex  20 mg twice daily with k+ 20 meq daily   Chronic Constipation - stable on senna twice daily and linzess 72 mcg daily  Hx Anemia - stable. Hgb 11.9  Chronic pancreatitis - Stable. 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    Past Medical History:  Diagnosis Date  . Acute deep vein thrombosis (DVT) of left femoral vein ([place]) 02/22/2015  . Alcohol abuse   . Arthritis   . Chronic constipation 08/26/2015  . Dementia with behavioral problem   . Diabetes mellitus without  complication ([place])   . Dysphagia    and aspiration risk  . 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 ([place])   . Seizures ([place])    History reviewed. No pertinent surgical history.  reports that she has been smoking cigarettes.  She has a 7.50 pack-year smoking history. she has never used smokeless tobacco. She reports that she drinks about 1.2 oz of alcohol per week. She reports that she does not use drugs. Social History   Socioeconomic History  . Marital status: Unknown    Spouse name: Not on file  . Number of children: Not on file  . Years of education: Not on file  . Highest education level: Not on file  Social Needs  . Financial resource strain: Not on file  . Food insecurity - worry: Not on file  . Food insecurity - inability: Not on file  . Transportation needs - medical: Not on file  . Transportation needs - non-medical: Not on file  Occupational History  . Not on file  Tobacco Use  . Smoking status: Current Every Day Smoker    Packs/day: 0.50    Years: 15.00    Pack years: 7.50    Types: Cigarettes  . Smokeless tobacco: Never Used  Substance and Sexual Activity  . Alcohol use: Yes    Alcohol/week: 1.2 oz    Types: 2 Cans of beer per week    Comment: occ  . Drug use: No  . Sexual activity: Not Currently  Other Topics Concern  . Not on file  Social History Narrative  . Not on file    Functional Status Survey:    History reviewed. No pertinent family history.  Health Maintenance  Topic Date Due  . URINE MICROALBUMIN  05/18/2017 (Originally 03/23/2017)  . PAP SMEAR  07/26/2017 (Originally 03/04/1979)  . OPHTHALMOLOGY EXAM  10/19/2017 (Originally 09/11/2017)  . Hepatitis C Screening  10/19/2017 (Originally 04/06/1956)  . HEMOGLOBIN A1C  09/19/2017  . FOOT EXAM  01/09/2018  . PNEUMOCOCCAL POLYSACCHARIDE VACCINE (2) 06/17/2019  . TETANUS/TDAP  01/19/2024  . INFLUENZA VACCINE  Completed  . HIV Screening  Completed    No Known Allergies  Outpatient Encounter Medications as  of 05/03/2017  Medication Sig  . Amino Acids-Protein Hydrolys (FEEDING SUPPLEMENT, PRO-STAT SUGAR FREE 64,) LIQD Take 30 mLs by mouth 2 (two) times daily.  Marland Kitchen CALCIUM ALGINATE EX Apply to coccyx wound daily  . cholecalciferol (VITAMIN D) 1000 UNITS tablet Take 1,000 Units by mouth daily. Reported on 10/28/2015  . diclofenac sodium (VOLTAREN) 1 % GEL Apply 2 g topically 4 (four) times daily. To both knees  . gabapentin (NEURONTIN) 300 MG capsule Take 3 capsules (900 mg total) by mouth 3 (three) times daily.  Marland Kitchen linaclotide (LINZESS) 72 MCG capsule Take 72 mcg by mouth daily before breakfast.  . linagliptin (TRADJENTA) 5 MG TABS tablet Take 5 mg by mouth daily.  . Melatonin 3 MG TABS Take 3 mg by mouth at bedtime.   . metFORMIN (GLUCOPHAGE-XR) 500 MG 24 hr tablet Take 1,000 mg by mouth daily with breakfast.   .  Multiple Vitamins-Minerals (DECUBI-VITE) CAPS Take 2 capsules by mouth at bedtime.  . Nutritional Supplements (NUTRITIONAL SUPPLEMENT PO) House Supplement - Give house supplement with meals daily for caloric and weight support  . OLANZapine (ZYPREXA) 10 MG tablet Take 10 mg by mouth at bedtime.  . polyethylene glycol (MIRALAX / GLYCOLAX) packet Take 17 g by mouth 2 (two) times daily.  . potassium chloride SA (K-DUR,KLOR-CON) 20 MEQ tablet Take 20 mEq by mouth 2 (two) times daily.   . rivaroxaban (XARELTO) 20 MG TABS tablet Take 20 mg by mouth at bedtime.  . senna-docusate (SENOKOT-S) 8.6-50 MG tablet Take 1 tablet by mouth 2 (two) times daily.  Marland Kitchen thiamine 100 MG tablet Take 100 mg by mouth daily.  Marland Kitchen torsemide (DEMADEX) 20 MG tablet Give 2 tablets (44m) by mouth two times daily  . traMADol (ULTRAM) 50 MG tablet Take 1 tablet (50 mg total) by mouth every 6 (six) hours.  . [DISCONTINUED] amoxicillin-clavulanate (AUGMENTIN) 875-125 MG tablet Take 1 tablet by mouth every 12 (twelve) hours for 3 days. (Patient not taking: Reported on 05/03/2017)  . [DISCONTINUED] rivaroxaban (XARELTO) 20 MG TABS  tablet Take 1 tablet (20 mg total) by mouth at bedtime. 20 mg one tablet by mouth daily at bedtime  May resume in one week if hematuria resolves. (Patient taking differently: Take 20 mg by mouth at bedtime. )   No facility-administered encounter medications on file as of 05/03/2017.     Review of Systems  Unable to perform ROS: Psychiatric disorder    Vitals:   05/03/17 1117  BP: 110/62  Pulse: (!) 102  Resp: 14  Temp: 97.9 F (36.6 C)  TempSrc: Oral  SpO2: (!) 65%  Weight: 141 lb 14.4 oz (64.4 kg)  Height: _0  (1.6 m)   Body mass index is 25.14 kg/m. Physical Exam  Constitutional: She appears well-developed.  Frail appearing, sitting in w/c in NAD  HENT:  Mouth/Throat: Oropharynx is clear and moist. No oropharyngeal exudate.  MMM; no oral thrush  Eyes: Pupils are equal, round, and reactive to light. No scleral icterus.  Neck: Neck supple. Carotid bruit is not present. No tracheal deviation present. No thyromegaly present.  Cardiovascular: Regular rhythm and intact distal pulses. Tachycardia present. Exam reveals no gallop and no friction rub.  Murmur heard.  Systolic murmur is present with a grade of 1/6. +1 pitting LE edema b/l. No calf TTP  Pulmonary/Chest: Effort normal and breath sounds normal. No stridor. No respiratory distress. She has no wheezes. She has no rales.  Abdominal: Soft. Normal appearance and bowel sounds are normal. She exhibits distension. She exhibits no mass. There is no hepatomegaly. There is no tenderness. There is no rigidity, no rebound and no guarding. No hernia.  Genitourinary:  Genitourinary Comments: Foley DTG clear yellow urine  Musculoskeletal: She exhibits edema and deformity (left TMA).  Lymphadenopathy:    She has no cervical adenopathy.  Neurological: She is alert.  Skin: Skin is warm and dry. No rash noted.  Pressure injury followed by facility wound care provider  Psychiatric: She has a normal mood and affect. Her behavior is  normal.    Labs reviewed: Basic Metabolic Panel: Recent Labs    04/26/17 2207  04/28/17 0253 04/29/17 0615 04/30/17 0547 05/02/17  NA  --    < > 144 142 144 141  K  --    < > 3.5 3.2* 3.0* 4.2  CL  --    < > 116* 112* 112*  --  CO2  --    < > 25 21* 23  --   GLUCOSE  --    < > 110* 101* 110*  --   BUN  --    < > 7 <5* <5* 8  CREATININE  --    < > 0.61 0.63 0.63 0.6  CALCIUM  --    < > 8.6* 8.6* 8.6*  --   MG 1.7  --  1.8  --  1.8  --    < > = values in this interval not displayed.   Liver Function Tests: Recent Labs    04/26/17 1937 04/28/17 0253 05/02/17  AST 36 22 62*  ALT 24 21 63*  ALKPHOS 84 57 103  BILITOT 0.5 0.5  --   PROT 8.8* 5.9*  --   ALBUMIN 3.7 2.7*  --    Recent Labs    04/28/17 0253  LIPASE 16   Recent Labs    04/26/17 1937 04/28/17 0253  AMMONIA 18 14   CBC: Recent Labs    04/26/17  04/27/17 0130  04/28/17 0253 04/29/17 0615 04/30/17 0901 05/02/17  WBC 16.9   < > 14.7*   < > 12.9* 10.0 10.5 12.4  NEUTROABS 12  --  8.7*  --   --   --  7.7  --   HGB 12.4   < > 9.7*   < > 8.7* 8.6* 9.4* 11.9*  HCT 39   < > 29.3*   < > 26.5* 26.5* 28.2* 35*  MCV  --    < > 86.7   < > 86.9 86.6 85.7  --   PLT 290   < > 229   < > 206 269 282 453*   < > = values in this interval not displayed.   Cardiac Enzymes: No results for input(s): CKTOTAL, CKMB, CKMBINDEX, TROPONINI in the last 8760 hours. BNP: Invalid input(s): POCBNP Lab Results  Component Value Date   HGBA1C 7.8 03/21/2017   Lab Results  Component Value Date   TSH 1.39 02/16/2017   Lab Results  Component Value Date   VOHYWVPX10 6,269 (H) 04/30/2017   Lab Results  Component Value Date   FOLATE 33.0 04/30/2017   Lab Results  Component Value Date   IRON 43 04/30/2017   TIBC 235 (L) 04/30/2017    Imaging and Procedures obtained prior to SNF admission: Dg Chest 2 View  Result Date: 04/26/2017 CLINICAL DATA:  Fatigue.  Mental status changes. EXAM: CHEST  2 VIEW COMPARISON:  None.  FINDINGS: Moderately degraded lateral view secondary to patient arm position and obliquity. Mildly oblique frontal radiograph. Moderate right hemidiaphragm elevation. Midline trachea. Normal heart size. Atherosclerosis in the transverse aorta. No pleural effusion or pneumothorax. Mildly low lung volumes. Increased density projecting over the right upper lobe may represent osseous summation shadow and vascular crowding. IMPRESSION: Possible right upper lobe opacity which could represent infection or be secondary to osseous summation shadow and vascular crowding. If possible, consider PA radiograph. If not possible, consider repeat frontal AP radiograph without obliquity. Aortic Atherosclerosis (ICD10-I70.0). Electronically Signed   By: Abigail Miyamoto M.D.   On: 04/26/2017 21:10   Ct Head Wo Contrast  Result Date: 04/26/2017 CLINICAL DATA:  Altered level of consciousness EXAM: CT HEAD WITHOUT CONTRAST TECHNIQUE: Contiguous axial images were obtained from the base of the skull through the vertex without intravenous contrast. COMPARISON:  03/27/2015 FINDINGS: Brain: No acute territorial infarction, hemorrhage or intracranial mass is visualized. Mild atrophy. Minimal  small vessel ischemic changes of the white matter. Stable ventricle size Vascular: No hyperdense vessels.  Carotid artery calcification. Skull: Normal. Negative for fracture or focal lesion. Sinuses/Orbits: No acute finding. Other: None IMPRESSION: No CT evidence for acute intracranial abnormality. Electronically Signed   By: Donavan Foil M.D.   On: 04/26/2017 21:26   Ct Abdomen Pelvis W Contrast  Result Date: 04/26/2017 CLINICAL DATA:  Severe abdominal distension. Altered mental status. History of constipation, alcohol abuse, diabetes. EXAM: CT ABDOMEN AND PELVIS WITH CONTRAST TECHNIQUE: Multidetector CT imaging of the abdomen and pelvis was performed using the standard protocol following bolus administration of intravenous contrast. CONTRAST:   177m ISOVUE-300 IOPAMIDOL (ISOVUE-300) INJECTION 61% COMPARISON:  CT abdomen and pelvis August 25, 2015 FINDINGS: LOWER CHEST: RIGHT lung base atelectasis. Included heart size is normal. No pericardial effusion. HEPATOBILIARY: Distended gallbladder with multiple subcentimeter gallstones. Dilated Common bile duct at 13 mm, no choledocholithiasis. No CT findings of acute cholecystitis. Normal liver. PANCREAS: Atrophic pancreas with multiple coarse calcifications. Unchanged 16 mm peripherally calcified cystic mass in distal body, additional smaller cysts most compatible with pseudocyst. Chronic pancreatic duct dilatation. SPLEEN: Normal. ADRENALS/URINARY TRACT: Kidneys are orthotopic, demonstrating symmetric enhancement. No nephrolithiasis, hydronephrosis or solid renal masses. The unopacified ureters are normal in course and caliber. Delayed imaging through the kidneys demonstrates symmetric prompt contrast excretion within the proximal urinary collecting system. Urinary bladder is distended with lobulated urinary bladder with multiple small diverticula seen with neurogenic bladder, similar to prior CT. Normal adrenal glands. STOMACH/BOWEL: Small hiatal hernia. Stool distended rectum at 7.9 cm with large amount of retained large bowel stool. Small amount of small bowel feces compatible with chronic stasis. Normal appendix. VASCULAR/LYMPHATIC: Aortoiliac vessels are normal in course and caliber. Mild intimal thickening calcific atherosclerosis. No lymphadenopathy by CT size criteria. REPRODUCTIVE: Status post hysterectomy. OTHER: No intraperitoneal free fluid or free air. MUSCULOSKELETAL: Nonacute. Mild rectus abdominis diastases. 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 Osteopenia. IMPRESSION: 1. Large amount of retained large bowel stool, stool distended rectum associated with fecal impaction. 2. Sequelae of chronic pancreatitis with multiple small pancreatic cysts most  compatible with pseudocyst. 3. Cholelithiasis and extra hepatic biliary dilatation without choledocholithiasis or CT findings of acute cholecystitis. 4. Distended urinary bladder seen with neurogenic bladder/bladder outlet obstruction. Recommend correlation with voiding. 5. 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 Aortic Atherosclerosis (ICD10-I70.0). Electronically Signed   By: CElon AlasM.D.   On: 04/26/2017 23:35    Assessment/Plan   ICD-10-CM   1. Urine retention R33.9   2. Type 2 diabetes mellitus with diabetic polyneuropathy, without long-term current use of insulin (HCC) E11.42   3. Dementia associated with alcoholism with behavioral disturbance ([place] F10.27   4. Upper leg DVT (deep venous thromboembolism), chronic, left (HCC) I82.5Y2   5. Other chronic pain G89.29   6. Anemia, unspecified type D64.9   7. Alcohol-induced psychosis with complication (HCC) FZ85.885  8. Bilateral lower extremity edema R60.0   9. Elevated LFTs R94.5   10. Pulmonary nodule R91.1    6 mm RLL in Dec 2018     GGT pending; follow CMP  Cont current meds as ordered  PT/OT/ST as indicated  Cont nutritional supplements as ordered  Foley cath care as indicated  Wound care as ordered  GOAL: short term rehab then long term care. Communicated with pt and nursing.  Labs/tests ordered: check GGT; repeat CT chest in 6 mos    [facility]  Wells Guiles  [facility] [address] [place], [place] [address] [phone] Cell (Monday-Friday 8 AM - 5 PM) [phone] After 5 PM and follow prompts

## 2017-05-09 ENCOUNTER — Encounter: Payer: Self-pay | Admitting: Adult Health

## 2017-05-09 ENCOUNTER — Non-Acute Institutional Stay (SKILLED_NURSING_FACILITY): Payer: Medicaid Other | Admitting: Adult Health

## 2017-05-09 DIAGNOSIS — R634 Abnormal weight loss: Secondary | ICD-10-CM | POA: Diagnosis not present

## 2017-05-09 DIAGNOSIS — F10959 Alcohol use, unspecified with alcohol-induced psychotic disorder, unspecified: Secondary | ICD-10-CM | POA: Diagnosis not present

## 2017-05-09 DIAGNOSIS — E1142 Type 2 diabetes mellitus with diabetic polyneuropathy: Secondary | ICD-10-CM | POA: Diagnosis not present

## 2017-05-09 DIAGNOSIS — I509 Heart failure, unspecified: Secondary | ICD-10-CM

## 2017-05-09 DIAGNOSIS — R627 Adult failure to thrive: Secondary | ICD-10-CM | POA: Diagnosis not present

## 2017-05-09 NOTE — Progress Notes (Signed)
Location:   [facility] Room Number: 204 B Place of Service:  SNF (31)   CODE STATUS: Full Code  No Known Allergies  Chief Complaint  Patient presents with  . Acute Visit    Weight Loss    HPI:  She has had a consistent weight loss her weight on 04-26-17: 141.9 pounds; with her weight on 05-09-17 of 132.2 pounds. She is unable to fully participate in the hpi or ros; but did tell me that she is not hungry. There are no reports of behavioral issues and no reports of sleep disturbances. She is having increased difficulty holding her self upright in there wheelchair. She continues to have a foley. Staff reports that she is having increased tremors.    Past Medical History:  Diagnosis Date  . Acute deep vein thrombosis (DVT) of left femoral vein (HCC) 02/22/2015  . Alcohol abuse   . Arthritis   . Chronic constipation 08/26/2015  . Dementia with behavioral problem   . Diabetes mellitus without complication (HCC)   . Dysphagia    and aspiration risk  . 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 Seizures (HCC)     History reviewed. No pertinent surgical history.  Social History   Socioeconomic History  . Marital status: Unknown    Spouse name: Not on file  . Number of children: Not on file  . Years of education: Not on file  . Highest education level: Not on file  Social Needs  . Financial resource strain: Not on file  . Food insecurity - worry: Not on file  . Food insecurity - inability: Not on file  . Transportation needs - medical: Not on file  . Transportation needs - non-medical: Not on file  Occupational History  . Not on file  Tobacco Use  . Smoking status: Current Every Day Smoker    Packs/day: 0.50    Years: 15.00    Pack years: 7.50    Types: Cigarettes  . Smokeless tobacco: Never Used  Substance and Sexual Activity  . Alcohol use: Yes    Alcohol/week: 1.2 oz    Types: 2 Cans of beer per week    Comment: occ  . Drug use: No  . Sexual activity: Not  Currently  Other Topics Concern  . Not on file  Social History Narrative  . Not on file   History reviewed. No pertinent family history.    VITAL SIGNS BP 110/62   Pulse (!) 102   Temp 97.9 F (36.6 C)   Resp 14   Ht 5\' 3"  (1.6 m)   Wt 132 lb 3.2 oz (60 kg)   LMP 03/25/2015 (Approximate)   SpO2 95%   BMI 23.42 kg/m   Outpatient Encounter Medications as of 05/09/2017  Medication Sig  . Amino Acids-Protein Hydrolys (FEEDING SUPPLEMENT, PRO-STAT SUGAR FREE 64,) LIQD Take 30 mLs by mouth 2 (two) times daily.  . cholecalciferol (VITAMIN D) 1000 UNITS tablet Take 1,000 Units by mouth daily. Reported on 10/28/2015  . diclofenac sodium (VOLTAREN) 1 % GEL Apply 2 g topically 4 (four) times daily. To both knees  . gabapentin (NEURONTIN) 300 MG capsule Take 3 capsules (900 mg total) by mouth 3 (three) times daily.  Marland Kitchen linaclotide (LINZESS) 72 MCG capsule Take 72 mcg by mouth daily before breakfast.  . linagliptin (TRADJENTA) 5 MG TABS tablet Take 5 mg by mouth daily.  . Melatonin 3 MG TABS Take 3 mg by mouth at bedtime.   Marland Kitchen  metFORMIN (GLUCOPHAGE-XR) 500 MG 24 hr tablet Take 1,000 mg by mouth daily with breakfast.   . Multiple Vitamins-Minerals (DECUBI-VITE) CAPS Take 2 capsules by mouth at bedtime.  . Nutritional Supplements (NUTRITIONAL SUPPLEMENT PO) House Supplement - Give house supplement with meals daily for caloric and weight support  . OLANZapine (ZYPREXA) 10 MG tablet Take 10 mg by mouth at bedtime.  . polyethylene glycol (MIRALAX / GLYCOLAX) packet Take 17 g by mouth 2 (two) times daily.  . potassium chloride SA (K-DUR,KLOR-CON) 20 MEQ tablet Take 20 mEq by mouth 2 (two) times daily.   . rivaroxaban (XARELTO) 20 MG TABS tablet Take 20 mg by mouth at bedtime.  . senna-docusate (SENOKOT-S) 8.6-50 MG tablet Take 1 tablet by mouth 2 (two) times daily.  Marland Kitchen. thiamine 100 MG tablet Take 100 mg by mouth daily.  Marland Kitchen. torsemide (DEMADEX) 20 MG tablet Give 2 tablets (40mg ) by mouth two times  daily  . traMADol (ULTRAM) 50 MG tablet Take 1 tablet (50 mg total) by mouth every 6 (six) hours.  . [DISCONTINUED] CALCIUM ALGINATE EX Apply to coccyx wound daily   No facility-administered encounter medications on file as of 05/09/2017.      SIGNIFICANT DIAGNOSTIC EXAMS  PREVIOUS  10-02-14: left leg doppler: +dvt  08-08-16: left lower extremity doppler: + DVT  01-18-17: 2-d echo: EF 55-60%; left ventricle hypertrophy; mild relaxation abnormality   04-26-17: chest x-ray: Possible right upper lobe opacity which could represent infection or be secondary to osseous summation shadow and vascular crowding. If possible, consider PA radiograph. If not possible, consider repeat frontal AP radiograph without obliquity. Aortic Atherosclerosis  04-26-17: ct of abdomen and pelvis: 1. Large amount of retained large bowel stool, stool distended rectum associated with fecal impaction. 2. Sequelae of chronic pancreatitis with multiple small pancreatic cysts most compatible with pseudocyst. 3. Cholelithiasis and extra hepatic biliary dilatation without choledocholithiasis or CT findings of acute cholecystitis. 4. Distended urinary bladder seen with neurogenic bladder/bladder outlet obstruction. Recommend correlation with voiding. 5. 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 Aortic Atherosclerosis  04-26-17: ct of head: No CT evidence for acute intracranial abnormality.   04-28-17: 2-d echo:   - Left ventricle: The cavity size was mildly dilated. Wall thickness was normal. Systolic function was severely reduced. The estimated ejection fraction was in the range of 20% to 25%. Severe diffuse hypokinesis with regional variations. Possible disproportionately severe hypokinesis of the inferolateral, inferior, and inferoseptal myocardium. Features are consistent with a pseudonormal left ventricular filling pattern, with concomitant abnormal relaxation and increased filling  pressure (grade 2 diastolic dysfunction).  04-29-17: ct of chest: 1. There is a 6 mm nodule in the right lung base. Non-contrast chest CT at 6-12 months is recommended. If the nodule is stable at time of repeat CT, then future CT at 18-24 months (from today's scan) is considered optional for low-risk patients, but is recommended for high-risk patients. :  2. No CT correlate for the recent chest x-ray finding in the right mid lung. 3. No other abnormalities.  NO NEW EXAMS    LABS REVIEWED: PREVIOUS     07-27-16: hgb a1c 7.1; chol 153; ldl 66; trig 164; hdl 55  1-61-094-27-18: wbc 12.1; hgb 11.1; hct 34.8; mcv 86.6; plt 282; glucose 106; bun 14.0; creat 0.55; k+ 3.8; na++ 140; ca 10.1; liver normal albumin 4.4  01-04-17: wbc 10.7; hgb 12.3; hct 37.3; mcv 83.6; plt 227; glucose 120; bun 13.6; creat 0.58; k+ 3.7; na++ 140; ca  9.4  01-11-17: BNP 339.10 02-16-17: tsh 1.39; vit B 12: 1092; folic 17.6  03-21-17: hgb a1c 7.8  04-26-17: wbc 18.5; hgb 11.9; hct 35.9; mcv 86.;3 plt 297; glucose 149; bun 24;creat 0.99; k+ 3.3; na++ 139; ca 9.6; liver normal albumin 3.7; ammonia 18; blood culture: no growth 04-27-17: urine culture: e-coli 04-28-17: wbc 12.9; hgb 8.7; hct 26.5; mcv 86.9; plt 206; glucose 110; bun 7; creat 0.61; k+ 3.5; na++ 144; ca 8.6; liver normal albumin 2.7 04-30-17: glucose 110; bun <5; creat 0.63; k+ 3.0; na++ 144; ca 8.6; mag 1.8; vit B 12: 1282; folate 33.0; iron 43; tibc 235.  NO NEW LABS          Review of Systems  Unable to perform ROS: Dementia (is confused )    Physical Exam  Constitutional: No distress.  Frail   Neck: No thyromegaly present.  Cardiovascular: Normal rate, regular rhythm, normal heart sounds and intact distal pulses.  Pedal pulses faint   Pulmonary/Chest: Effort normal and breath sounds normal. No respiratory distress.  Abdominal: Soft. Bowel sounds are normal. She exhibits no distension. There is no tenderness.  Genitourinary:  Genitourinary Comments: Has foley     Musculoskeletal: She exhibits edema.  Able to move all extremities  Leans further to the  left in wheelchair Has 2+ lower extremity edema bilaterally with the left worse than right   Legs are tender to touch      Lymphadenopathy:    She has no cervical adenopathy.  Neurological: She is alert.  Has bilateral hand tremors   Skin: Skin is warm and dry. She is not diaphoretic.  Psychiatric: She has a normal mood and affect.     ASSESSMENT/ PLAN:  1. Diabetes mellitus with neurological manifestations 2. Failure to thrive in adult with weight loss 3.  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 Bilateral lower extremity edema   Will stop metformin due to weight loss Will lower her zyprexa to 7.5 mg nightly due to hand tremors Will lower her demadex to 20 mg daily Will weight her three times weekly Will begin remeron 7.5 mg nightly for 30 days for her weight loss  Will monitor her status.   MD is aware of resident's narcotic use and is in agreement with current plan of care. We will attempt to wean resident as apropriate   Synthia Innocenteborah Berkeley Vanaken NP [facility]  Contact [phone] Monday through Friday 8am- 5pm  After hours call [phone]

## 2017-05-10 IMAGING — CT CT HEAD W/O CM
2 series · 16 of 30 positions shown, 20 images · non-contrast
Comparison: 08/04/2014

CLINICAL DATA: Left frontal hematoma and laceration status post
fall.

EXAM:
CT HEAD WITHOUT CONTRAST
TECHNIQUE: Contiguous axial images were obtained from the base of the skull
through the vertex without intravenous contrast.

[Series 2: head w/o · axial · non-contrast · 0.45mm/px · z∈[-165,-30]mm · 13 of 33 slices shown, 17 images]
[im 3/33  brain]
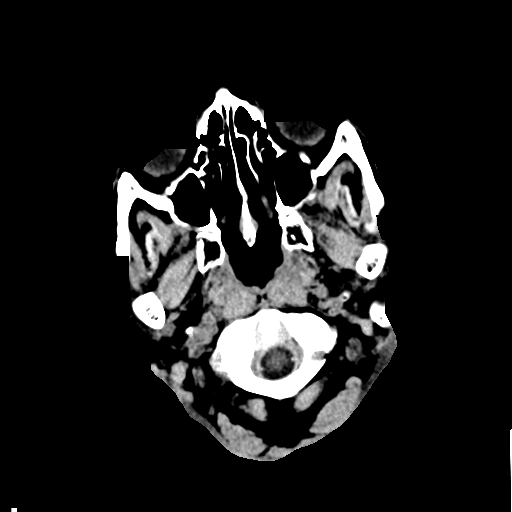
[im 3/33  bone]
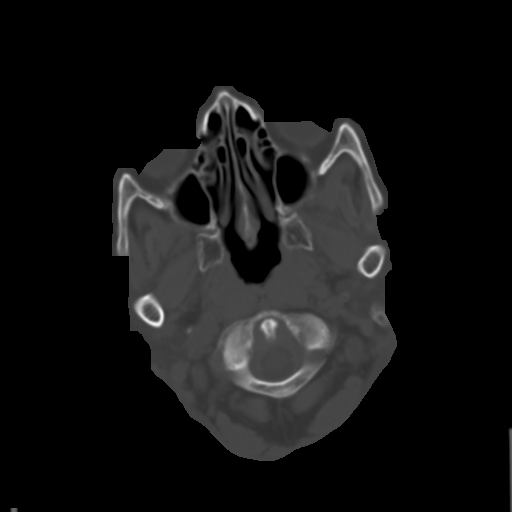
[im 5/33  brain]
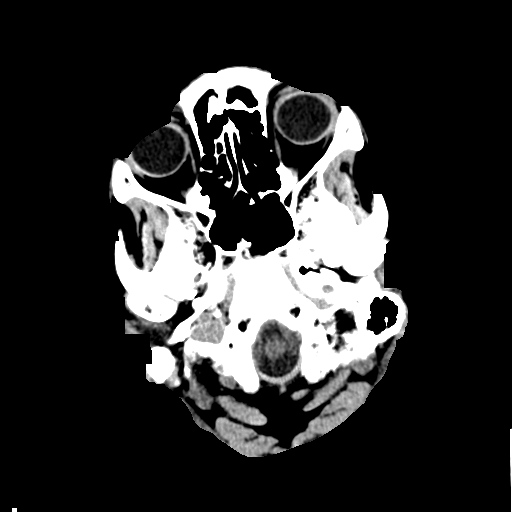
[im 7/33  brain]
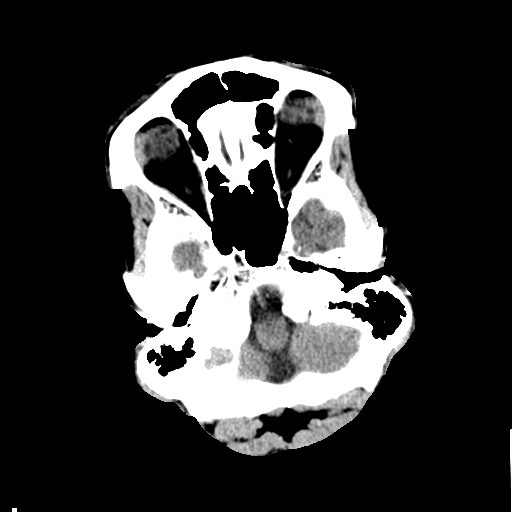
[im 10/33  brain]
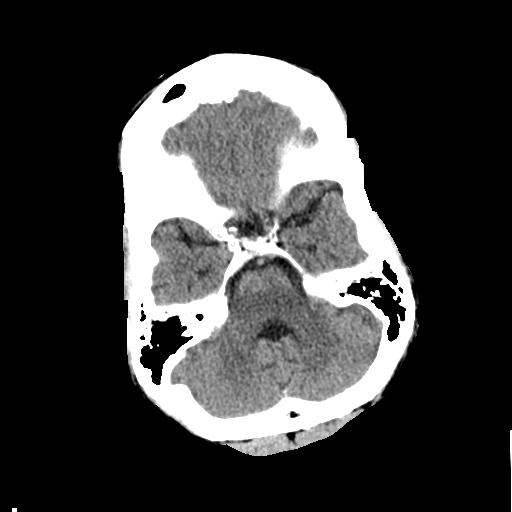
[im 12/33  brain]
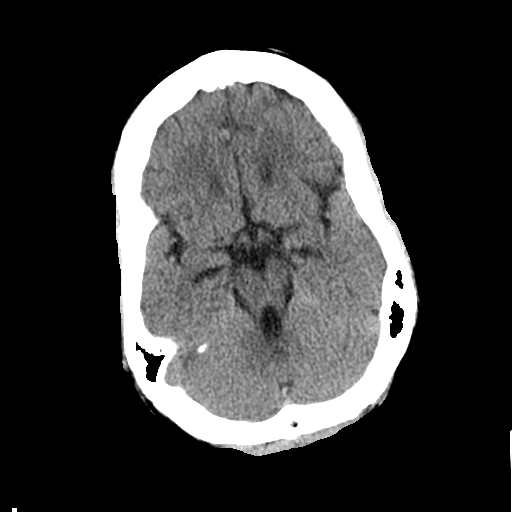
[im 12/33  bone]
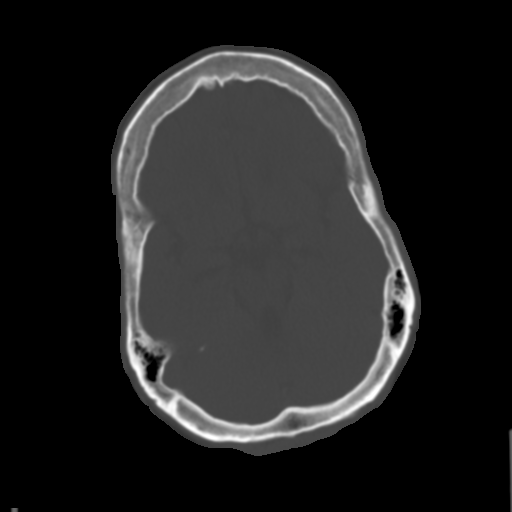
[im 14/33  brain]
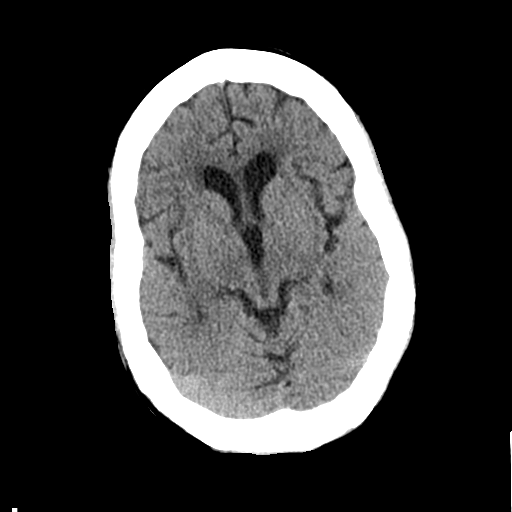
[im 17/33  brain]
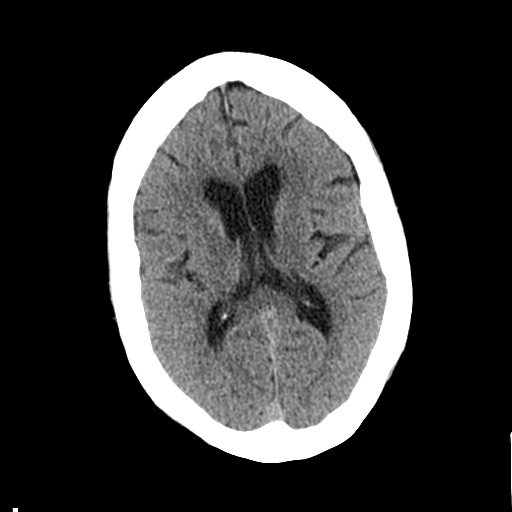
[im 19/33  brain]
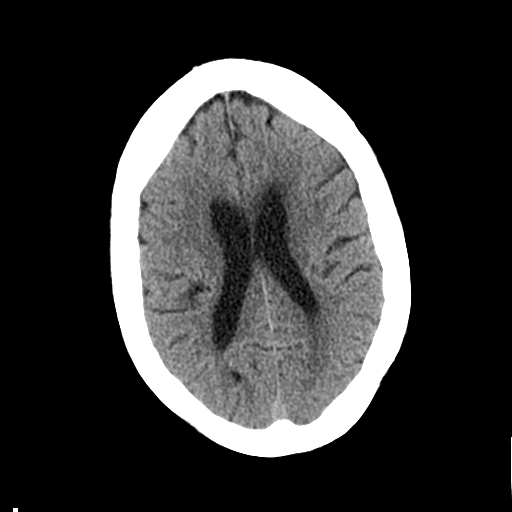
[im 21/33  brain]
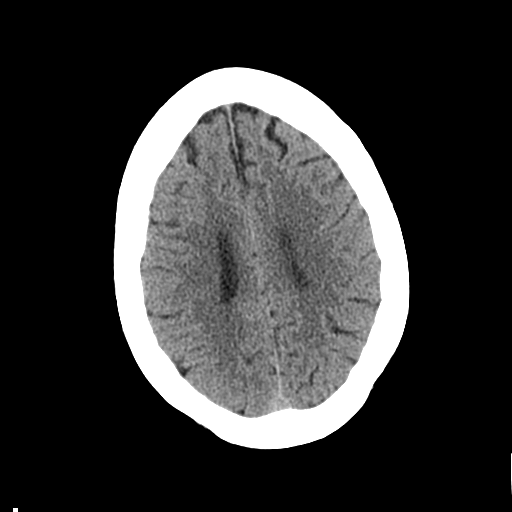
[im 21/33  bone]
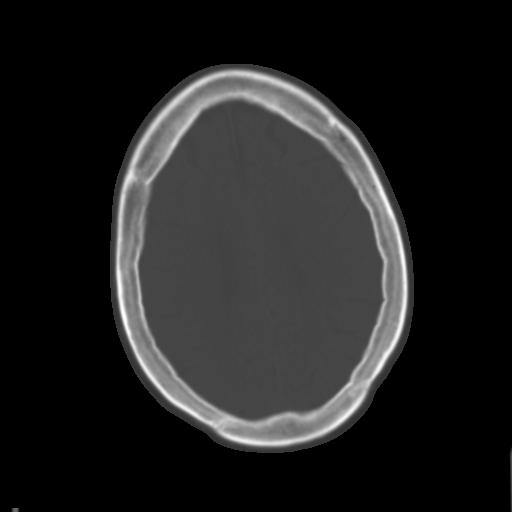
[im 23/33  brain]
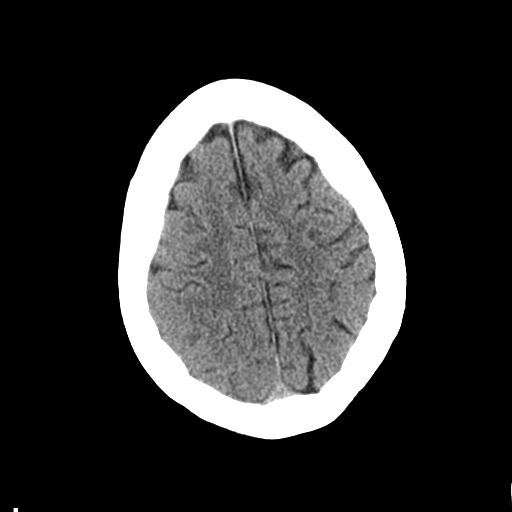
[im 26/33  brain]
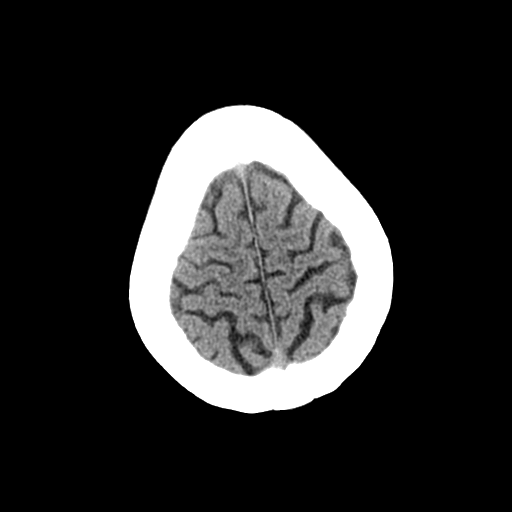
[im 28/33  brain]
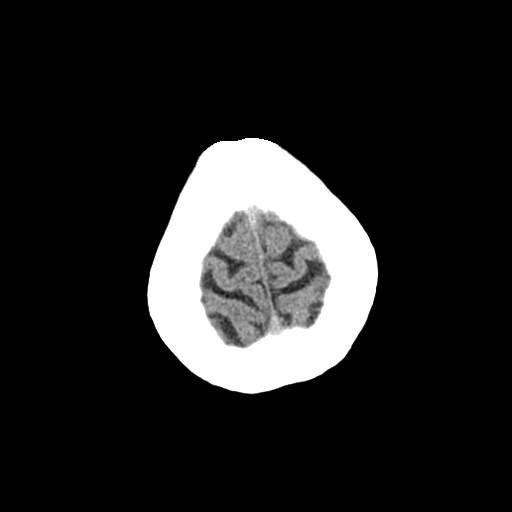
[im 30/33  brain]
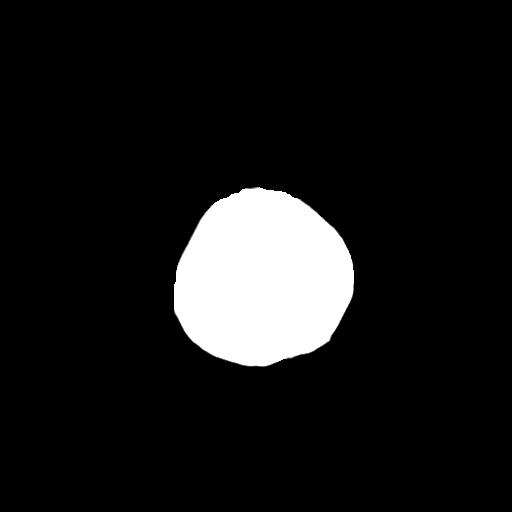
[im 30/33  bone]
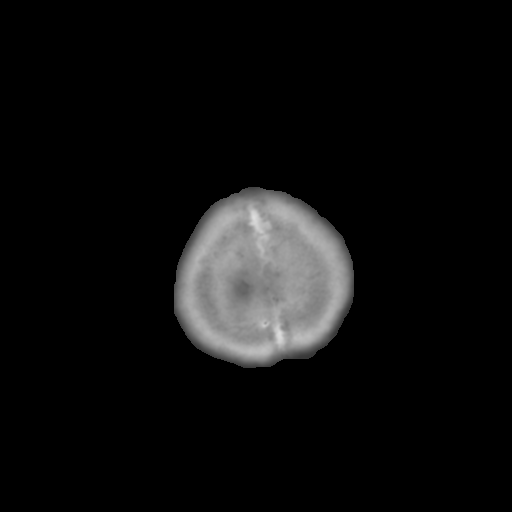

[Series 3: bone windows · axial · 0.45mm/px · z∈[-165,-120]mm · 3 of 33 slices shown]
[im 3/33  bone]
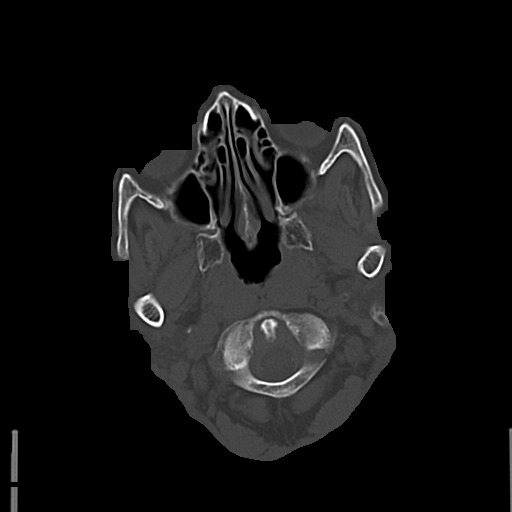
[im 7/33  bone]
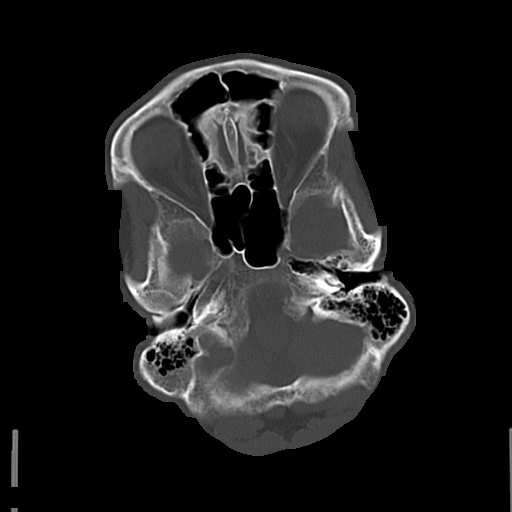
[im 12/33  bone]
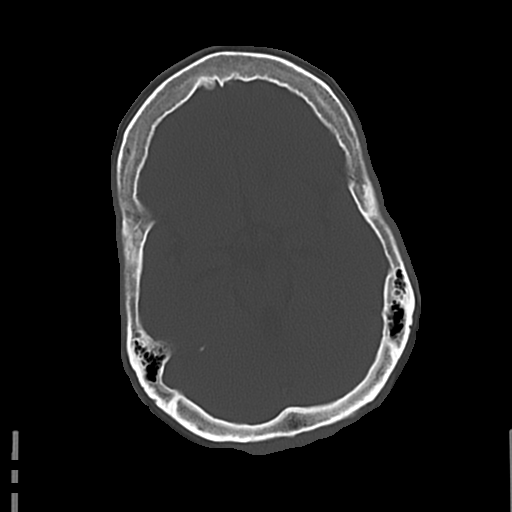

[16 of 30 positions shown; findings below may reference images not displayed]

FINDINGS: No mass effect or midline shift. No evidence of acute intracranial
hemorrhage, or infarction. No abnormal extra-axial fluid
collections. Gray-white matter differentiation is normal. Basal
cisterns are preserved. There is mild brain parenchymal atrophy and
chronic small vessel disease changes.

No depressed skull fractures. Visualized paranasal sinuses and
mastoid air cells are not opacified.
IMPRESSION: No acute intracranial abnormality.

Mild brain parenchymal atrophy, chronic microvascular disease.

## 2017-05-12 LAB — FECAL OCCULT BLOOD, GUAIAC: FECAL OCCULT BLD: NEGATIVE

## 2017-05-16 DIAGNOSIS — R339 Retention of urine, unspecified: Secondary | ICD-10-CM | POA: Insufficient documentation

## 2017-05-17 ENCOUNTER — Encounter: Payer: Self-pay | Admitting: Adult Health

## 2017-05-17 ENCOUNTER — Non-Acute Institutional Stay (SKILLED_NURSING_FACILITY): Payer: Medicaid Other | Admitting: Adult Health

## 2017-05-17 DIAGNOSIS — F0150 Vascular dementia without behavioral disturbance: Secondary | ICD-10-CM

## 2017-05-17 DIAGNOSIS — G2401 Drug induced subacute dyskinesia: Secondary | ICD-10-CM

## 2017-05-17 DIAGNOSIS — F10959 Alcohol use, unspecified with alcohol-induced psychotic disorder, unspecified: Secondary | ICD-10-CM

## 2017-05-17 DIAGNOSIS — T43505A Adverse effect of unspecified antipsychotics and neuroleptics, initial encounter: Secondary | ICD-10-CM

## 2017-05-17 LAB — HEPATIC FUNCTION PANEL
ALT: 32 (ref 7–35)
AST: 23 (ref 13–35)
Alkaline Phosphatase: 112 (ref 25–125)
Bilirubin, Total: 0.3

## 2017-05-17 LAB — BASIC METABOLIC PANEL
BUN: 14 (ref 4–21)
CREATININE: 0.6 (ref 0.5–1.1)
GLUCOSE: 140
POTASSIUM: 3.8 (ref 3.4–5.3)
Sodium: 137 (ref 137–147)

## 2017-05-17 LAB — HM HEPATITIS C SCREENING LAB: HM HEPATITIS C SCREENING: NEGATIVE

## 2017-05-17 NOTE — Progress Notes (Signed)
Location:   [facility] Room Number: 204 B Place of Service:  SNF (31)   CODE STATUS: Full Code  No Known Allergies  Chief Complaint  Patient presents with  . Acute Visit    Change in status    HPI:  Staff reports that for the past several they has noticed that her tremors are getting worse. She does have bilateral hand tremors are present and are interfering in her ability to pick up items such has her coffee cup. She continues to have difficulty holding herself upright as well.  She is unable to fully participate in the hpi or ros. She has been on long term zyprexa for her psychosis due to alcohol.   Past Medical History:  Diagnosis Date  . Acute deep vein thrombosis (DVT) of left femoral vein (HCC) 02/22/2015  . Alcohol abuse   . Arthritis   . Chronic constipation 08/26/2015  . Dementia with behavioral problem   . Diabetes mellitus without complication (HCC)   . Dysphagia    and aspiration risk  . 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 Seizures (HCC)     History reviewed. No pertinent surgical history.  Social History   Socioeconomic History  . Marital status: Unknown    Spouse name: Not on file  . Number of children: Not on file  . Years of education: Not on file  . Highest education level: Not on file  Social Needs  . Financial resource strain: Not on file  . Food insecurity - worry: Not on file  . Food insecurity - inability: Not on file  . Transportation needs - medical: Not on file  . Transportation needs - non-medical: Not on file  Occupational History  . Not on file  Tobacco Use  . Smoking status: Current Every Day Smoker    Packs/day: 0.50    Years: 15.00    Pack years: 7.50    Types: Cigarettes  . Smokeless tobacco: Never Used  Substance and Sexual Activity  . Alcohol use: Yes    Alcohol/week: 1.2 oz    Types: 2 Cans of beer per week    Comment: occ  . Drug use: No  . Sexual activity: Not Currently  Other Topics Concern  . Not on  file  Social History Narrative  . Not on file   History reviewed. No pertinent family history.    VITAL SIGNS BP 129/75   Pulse (!) 109   Temp (!) 95.3 F (35.2 C) Comment: axillary  Resp 16   Ht 5\' 3"  (1.6 m)   Wt 135 lb (61.2 kg)   LMP 03/25/2015 (Approximate)   SpO2 96%   BMI 23.91 kg/m   Outpatient Encounter Medications as of 05/17/2017  Medication Sig  . Amino Acids-Protein Hydrolys (FEEDING SUPPLEMENT, PRO-STAT SUGAR FREE 64,) LIQD Take 30 mLs by mouth 2 (two) times daily.  . cholecalciferol (VITAMIN D) 1000 UNITS tablet Take 1,000 Units by mouth daily. Reported on 10/28/2015  . diclofenac sodium (VOLTAREN) 1 % GEL Apply 2 g topically 4 (four) times daily. To both knees  . gabapentin (NEURONTIN) 300 MG capsule Take 3 capsules (900 mg total) by mouth 3 (three) times daily.  Marland Kitchen. linaclotide (LINZESS) 72 MCG capsule Take 72 mcg by mouth daily before breakfast.  . linagliptin (TRADJENTA) 5 MG TABS tablet Take 5 mg by mouth daily.  . Melatonin 3 MG TABS Take 3 mg by mouth at bedtime.   . mirtazapine (REMERON) 15 MG tablet Take  7.5 mg by mouth at bedtime.  . Multiple Vitamins-Minerals (DECUBI-VITE) CAPS Take 2 capsules by mouth at bedtime.  . Nutritional Supplements (NUTRITIONAL SUPPLEMENT PO) House Supplement - Give house supplement with meals daily for caloric and weight support  . OLANZapine (ZYPREXA) 10 MG tablet Take 10 mg by mouth at bedtime.  . polyethylene glycol (MIRALAX / GLYCOLAX) packet Take 17 g by mouth 2 (two) times daily.  . potassium chloride SA (K-DUR,KLOR-CON) 20 MEQ tablet Take 20 mEq by mouth 2 (two) times daily.   . rivaroxaban (XARELTO) 20 MG TABS tablet Take 20 mg by mouth at bedtime.  . senna-docusate (SENOKOT-S) 8.6-50 MG tablet Take 1 tablet by mouth 2 (two) times daily.  Marland Kitchen thiamine 100 MG tablet Take 100 mg by mouth daily.  Marland Kitchen torsemide (DEMADEX) 20 MG tablet Take 20 mg by mouth daily.   . traMADol (ULTRAM) 50 MG tablet Take 1 tablet (50 mg total) by  mouth every 6 (six) hours.  . [DISCONTINUED] metFORMIN (GLUCOPHAGE-XR) 500 MG 24 hr tablet Take 1,000 mg by mouth daily with breakfast.    No facility-administered encounter medications on file as of 05/17/2017.      SIGNIFICANT DIAGNOSTIC EXAMS  PREVIOUS  10-02-14: left leg doppler: +dvt  08-08-16: left lower extremity doppler: + DVT  01-18-17: 2-d echo: EF 55-60%; left ventricle hypertrophy; mild relaxation abnormality   04-26-17: chest x-ray: Possible right upper lobe opacity which could represent infection or be secondary to osseous summation shadow and vascular crowding. If possible, consider PA radiograph. If not possible, consider repeat frontal AP radiograph without obliquity. Aortic Atherosclerosis  04-26-17: ct of abdomen and pelvis: 1. Large amount of retained large bowel stool, stool distended rectum associated with fecal impaction. 2. Sequelae of chronic pancreatitis with multiple small pancreatic cysts most compatible with pseudocyst. 3. Cholelithiasis and extra hepatic biliary dilatation without choledocholithiasis or CT findings of acute cholecystitis. 4. Distended urinary bladder seen with neurogenic bladder/bladder outlet obstruction. Recommend correlation with voiding. 5. 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 Aortic Atherosclerosis  04-26-17: ct of head: No CT evidence for acute intracranial abnormality.   04-28-17: 2-d echo:   - Left ventricle: The cavity size was mildly dilated. Wall thickness was normal. Systolic function was severely reduced. The estimated ejection fraction was in the range of 20% to 25%. Severe diffuse hypokinesis with regional variations. Possible disproportionately severe hypokinesis of the inferolateral, inferior, and inferoseptal myocardium. Features are consistent with a pseudonormal left ventricular filling pattern, with concomitant abnormal relaxation and increased filling pressure (grade 2 diastolic  dysfunction).  04-29-17: ct of chest: 1. There is a 6 mm nodule in the right lung base. Non-contrast chest CT at 6-12 months is recommended. If the nodule is stable at time of repeat CT, then future CT at 18-24 months (from today's scan) is considered optional for low-risk patients, but is recommended for high-risk patients. :  2. No CT correlate for the recent chest x-ray finding in the right mid lung. 3. No other abnormalities.  NO NEW EXAMS    LABS REVIEWED: PREVIOUS     07-27-16: hgb a1c 7.1; chol 153; ldl 66; trig 164; hdl 55  1-61-09: wbc 12.1; hgb 11.1; hct 34.8; mcv 86.6; plt 282; glucose 106; bun 14.0; creat 0.55; k+ 3.8; na++ 140; ca 10.1; liver normal albumin 4.4  01-04-17: wbc 10.7; hgb 12.3; hct 37.3; mcv 83.6; plt 227; glucose 120; bun 13.6; creat 0.58; k+ 3.7; na++ 140; ca 9.4  01-11-17: BNP  339.10 02-16-17: tsh 1.39; vit B 12: 1092; folic 17.6  03-21-17: hgb a1c 7.8  04-26-17: wbc 18.5; hgb 11.9; hct 35.9; mcv 86.;3 plt 297; glucose 149; bun 24;creat 0.99; k+ 3.3; na++ 139; ca 9.6; liver normal albumin 3.7; ammonia 18; blood culture: no growth 04-27-17: urine culture: e-coli 04-28-17: wbc 12.9; hgb 8.7; hct 26.5; mcv 86.9; plt 206; glucose 110; bun 7; creat 0.61; k+ 3.5; na++ 144; ca 8.6; liver normal albumin 2.7 04-30-17: glucose 110; bun <5; creat 0.63; k+ 3.0; na++ 144; ca 8.6; mag 1.8; vit B 12: 1282; folate 33.0; iron 43; tibc 235.  NO NEW LABS          Review of Systems  Unable to perform ROS: Dementia (confused )    Physical Exam  Constitutional: No distress.  Frail   Neck: No thyromegaly present.  Cardiovascular: Normal rate, regular rhythm, normal heart sounds and intact distal pulses.  Pedal pulses faint   Pulmonary/Chest: Effort normal and breath sounds normal. No respiratory distress.  Abdominal: Soft. Bowel sounds are normal. She exhibits no distension. There is no tenderness.  Genitourinary:  Genitourinary Comments: Has foley   Musculoskeletal: She exhibits  edema.  Able to move all extremities  Leans further to the  left in wheelchair Has 2+ lower extremity edema bilaterally with the left worse than right   Legs are tender to touch   Lymphadenopathy:    She has no cervical adenopathy.  Neurological: She is alert.  Has bilateral resting hand tremors present; which are significant   Skin: Skin is warm and dry. She is not diaphoretic.  Psychiatric: She has a normal mood and affect.    ASSESSMENT/ PLAN:  TODAY:   1. 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 Tardive dyskinesia  Will begin cogentin 0.5 mg nightly and will monitor her status.   MD is aware of resident's narcotic use and is in agreement with current plan of care. We will attempt to wean resident as apropriate   Synthia Innocenteborah Green NP [facility]  Contact [phone] Monday through Friday 8am- 5pm  After hours call [phone]

## 2017-05-24 ENCOUNTER — Other Ambulatory Visit: Payer: Self-pay

## 2017-05-24 MED ORDER — TRAMADOL HCL 50 MG PO TABS: 50.0000 mg | ORAL_TABLET | Freq: Four times a day (QID) | ORAL | 0 refills | Status: DC

## 2017-05-24 NOTE — Telephone Encounter (Signed)
RX faxed to [facility] @ [phone], phone number [phone] 

## 2017-05-27 DIAGNOSIS — G2401 Drug induced subacute dyskinesia: Secondary | ICD-10-CM | POA: Insufficient documentation

## 2017-05-27 DIAGNOSIS — T43505A Adverse effect of unspecified antipsychotics and neuroleptics, initial encounter: Secondary | ICD-10-CM

## 2017-05-31 ENCOUNTER — Non-Acute Institutional Stay (SKILLED_NURSING_FACILITY): Payer: Medicaid Other | Admitting: Adult Health

## 2017-05-31 ENCOUNTER — Encounter: Payer: Self-pay | Admitting: Adult Health

## 2017-05-31 DIAGNOSIS — F10959 Alcohol use, unspecified with alcohol-induced psychotic disorder, unspecified: Secondary | ICD-10-CM

## 2017-05-31 DIAGNOSIS — G8929 Other chronic pain: Secondary | ICD-10-CM

## 2017-05-31 DIAGNOSIS — E1142 Type 2 diabetes mellitus with diabetic polyneuropathy: Secondary | ICD-10-CM

## 2017-05-31 DIAGNOSIS — M170 Bilateral primary osteoarthritis of knee: Secondary | ICD-10-CM

## 2017-05-31 DIAGNOSIS — F1027 Alcohol dependence with alcohol-induced persisting dementia: Secondary | ICD-10-CM

## 2017-05-31 DIAGNOSIS — F0150 Vascular dementia without behavioral disturbance: Secondary | ICD-10-CM | POA: Diagnosis not present

## 2017-05-31 NOTE — Progress Notes (Signed)
Location:   [facility] Room Number: 204 B Place of Service:  SNF (31)   CODE STATUS: Full Code  No Known Allergies  Chief Complaint  Patient presents with  . Medical Management of Chronic Issues    Diabetes; psychosis; dementia; osteoarthritis of knees and chronic pain     HPI:  She is a 62 year old long tem resident of this facility being seen for the management of her chronic illnesses: diabetes; psychosis; dementia; osteoarthritis of knees; and chronic pain. She is unable to participate in the hpi or ros. There are no reports of uncontrolled pain; worsening behaviors; changes in appetite. There are no nursing concerns at this time.    Past Medical History:  Diagnosis Date  . Acute deep vein thrombosis (DVT) of left femoral vein (HCC) 02/22/2015  . Alcohol abuse   . Arthritis   . Chronic constipation 08/26/2015  . Dementia with behavioral problem   . Diabetes mellitus without complication (HCC)   . Dysphagia    and aspiration risk  . 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 Seizures (HCC)     History reviewed. No pertinent surgical history.  Social History   Socioeconomic History  . Marital status: Unknown    Spouse name: Not on file  . Number of children: Not on file  . Years of education: Not on file  . Highest education level: Not on file  Social Needs  . Financial resource strain: Not on file  . Food insecurity - worry: Not on file  . Food insecurity - inability: Not on file  . Transportation needs - medical: Not on file  . Transportation needs - non-medical: Not on file  Occupational History  . Not on file  Tobacco Use  . Smoking status: Current Every Day Smoker    Packs/day: 0.50    Years: 15.00    Pack years: 7.50    Types: Cigarettes  . Smokeless tobacco: Never Used  Substance and Sexual Activity  . Alcohol use: Yes    Alcohol/week: 1.2 oz    Types: 2 Cans of beer per week    Comment: occ  . Drug use: No  . Sexual activity: Not  Currently  Other Topics Concern  . Not on file  Social History Narrative  . Not on file   History reviewed. No pertinent family history.    VITAL SIGNS BP (!) 152/76   Pulse 78   Temp 97.9 F (36.6 C)   Resp 16   Ht 5\' 3"  (1.6 m)   Wt 137 lb 11.2 oz (62.5 kg)   LMP 03/25/2015 (Approximate)   SpO2 95%   BMI 24.39 kg/m   Outpatient Encounter Medications as of 05/31/2017  Medication Sig  . Amino Acids-Protein Hydrolys (FEEDING SUPPLEMENT, PRO-STAT SUGAR FREE 64,) LIQD Take 30 mLs by mouth 2 (two) times daily.  . benztropine (COGENTIN) 0.5 MG tablet Take 0.5 mg by mouth at bedtime.  . cholecalciferol (VITAMIN D) 1000 UNITS tablet Take 1,000 Units by mouth daily. Reported on 10/28/2015  . diclofenac sodium (VOLTAREN) 1 % GEL Apply 2 g topically 4 (four) times daily. To both knees  . gabapentin (NEURONTIN) 300 MG capsule Take 3 capsules (900 mg total) by mouth 3 (three) times daily.  Marland Kitchen linaclotide (LINZESS) 72 MCG capsule Take 72 mcg by mouth daily before breakfast.  . linagliptin (TRADJENTA) 5 MG TABS tablet Take 5 mg by mouth daily.  . Melatonin 3 MG TABS Take 3 mg by mouth  at bedtime.   . mirtazapine (REMERON) 15 MG tablet Take 7.5 mg by mouth at bedtime.  . Multiple Vitamins-Minerals (DECUBI-VITE) CAPS Take 2 capsules by mouth at bedtime.  . Nutritional Supplements (NUTRITIONAL SUPPLEMENT PO) House Supplement - Give house supplement with meals daily for caloric and weight support  . OLANZapine (ZYPREXA) 10 MG tablet Take 10 mg by mouth at bedtime.  . polyethylene glycol (MIRALAX / GLYCOLAX) packet Take 17 g by mouth 2 (two) times daily.  . potassium chloride SA (K-DUR,KLOR-CON) 20 MEQ tablet Take 20 mEq by mouth 2 (two) times daily.   . rivaroxaban (XARELTO) 20 MG TABS tablet Take 20 mg by mouth at bedtime.  . senna-docusate (SENOKOT-S) 8.6-50 MG tablet Take 1 tablet by mouth 2 (two) times daily.  Marland Kitchen. thiamine 100 MG tablet Take 100 mg by mouth daily.  Marland Kitchen. torsemide (DEMADEX) 20 MG  tablet Take 20 mg by mouth daily.   . traMADol (ULTRAM) 50 MG tablet Take 1 tablet (50 mg total) by mouth every 6 (six) hours.   No facility-administered encounter medications on file as of 05/31/2017.      SIGNIFICANT DIAGNOSTIC EXAMS  PREVIOUS  10-02-14: left leg doppler: +dvt  08-08-16: left lower extremity doppler: + DVT  01-18-17: 2-d echo: EF 55-60%; left ventricle hypertrophy; mild relaxation abnormality   04-26-17: chest x-ray: Possible right upper lobe opacity which could represent infection or be secondary to osseous summation shadow and vascular crowding. If possible, consider PA radiograph. If not possible, consider repeat frontal AP radiograph without obliquity. Aortic Atherosclerosis  04-26-17: ct of abdomen and pelvis: 1. Large amount of retained large bowel stool, stool distended rectum associated with fecal impaction. 2. Sequelae of chronic pancreatitis with multiple small pancreatic cysts most compatible with pseudocyst. 3. Cholelithiasis and extra hepatic biliary dilatation without choledocholithiasis or CT findings of acute cholecystitis. 4. Distended urinary bladder seen with neurogenic bladder/bladder outlet obstruction. Recommend correlation with voiding. 5. 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 Aortic Atherosclerosis  04-26-17: ct of head: No CT evidence for acute intracranial abnormality.   04-28-17: 2-d echo:   - Left ventricle: The cavity size was mildly dilated. Wall thickness was normal. Systolic function was severely reduced. The estimated ejection fraction was in the range of 20% to 25%. Severe diffuse hypokinesis with regional variations. Possible disproportionately severe hypokinesis of the inferolateral, inferior, and inferoseptal myocardium. Features are consistent with a pseudonormal left ventricular filling pattern, with concomitant abnormal relaxation and increased filling pressure (grade 2 diastolic  dysfunction).  04-29-17: ct of chest: 1. There is a 6 mm nodule in the right lung base. Non-contrast chest CT at 6-12 months is recommended. If the nodule is stable at time of repeat CT, then future CT at 18-24 months (from today's scan) is considered optional for low-risk patients, but is recommended for high-risk patients. :  2. No CT correlate for the recent chest x-ray finding in the right mid lung. 3. No other abnormalities.  NO NEW EXAMS    LABS REVIEWED: PREVIOUS     07-27-16: hgb a1c 7.1; chol 153; ldl 66; trig 164; hdl 55  1-61-094-27-18: wbc 12.1; hgb 11.1; hct 34.8; mcv 86.6; plt 282; glucose 106; bun 14.0; creat 0.55; k+ 3.8; na++ 140; ca 10.1; liver normal albumin 4.4  01-04-17: wbc 10.7; hgb 12.3; hct 37.3; mcv 83.6; plt 227; glucose 120; bun 13.6; creat 0.58; k+ 3.7; na++ 140; ca 9.4  01-11-17: BNP 339.10 02-16-17: tsh 1.39; vit B 12: 1092;  folic 17.6  03-21-17: hgb a1c 7.8  04-20-17: wbc 12.4; hgb 11.9; hct 35.9; mcv 85.3; plt 268; glucose 136; bun 16.5; creat 0.60 ;k+ 3.3; na++ 138; liver normal albumin 4.1  04-26-17: wbc 18.5; hgb 11.9; hct 35.9; mcv 86.;3 plt 297; glucose 149; bun 24;creat 0.99; k+ 3.3; na++ 139; ca 9.6; liver normal albumin 3.7; ammonia 18; blood culture: no growth 04-27-17: urine culture: e-coli 04-28-17: wbc 12.9; hgb 8.7; hct 26.5; mcv 86.9; plt 206; glucose 110; bun 7; creat 0.61; k+ 3.5; na++ 144; ca 8.6; liver normal albumin 2.7 04-30-17: glucose 110; bun <5; creat 0.63; k+ 3.0; na++ 144; ca 8.6; mag 1.8; vit B 12: 1282; folate 33.0; iron 43; tibc 235.  TODAY:   05-02-17: wbc 12.4; hgb 11.9; hct 35.1; mcv 86.1 plt 453; glucose 120; bun 7.5; creat 0.57; k+ 4.2; na++ 141; ca 9.7; ast 63; alt 62; albumin 4.3;      Review of Systems  Unable to perform ROS: Dementia (confused )     Physical Exam  Constitutional: No distress.  Frail   Neck: No thyromegaly present.  Cardiovascular: Normal rate, regular rhythm, normal heart sounds and intact distal pulses.   Pulmonary/Chest: Effort normal and breath sounds normal. No respiratory distress.  Abdominal: Soft. Bowel sounds are normal. She exhibits distension. There is no tenderness.  Genitourinary:  Genitourinary Comments: Has foley   Musculoskeletal: She exhibits edema.  ble to move all extremities  Leans further to the  left in wheelchair Has 2+ lower extremity edema bilaterally with the left worse than right   Legs are tender to touch    Lymphadenopathy:    She has no cervical adenopathy.  Neurological: She is alert.  Has bilateral hand tremors   Skin: Skin is warm and dry. She is not diaphoretic.  Unstaged coccyx: 2.6 x 0.7 x 0.2 cm  Psychiatric: She has a normal mood and affect.    ASSESSMENT/ PLAN:  TODAY   1. Diabetes mellitus with neurological manifestations: without change  hgb a1c is 7.8 (previous 7.1)  Urine for micro-albumin is 7.4;   Will continue metformin xr  500 mg  daily;   tradjenta 5 mg daily and will monitor  is not on statin her ldl is 66; is not on ace/arb   2 .Chronic pain with osteoarthritis of both knees: she is  getting adequate pain relief: will continue voltaren gel 2 gm to both knees four times daily; will continue neurontin  900 mg three times daily; and will continue ultram  50 mg every 6 hours routinely    3. Psychosis; due to alcohol: she is presently without change in status; will continue zyprexa 10 mg daily; this dose was adjusted by psych services in Oct 2018 thiamine and folic acid daily. She is not on a prn benzo at this time.   4. 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 Her current weight is 137 pounds her weight in July 2018: 154 pounds with this disease process weight loss is an expected outcome. Is taking remeron 7.5 mg nightly to help with appetite   PREVIOUS   5. Weight loss: her current weight is 137: she is  slowly trending downward; unfortunately weight loss is an expected outcome with advanced stages of dementia.   6. DVT left leg:  (08-08-16; #2) stable  will continue xarelto 20 mg nightly she is on long term  therapy  will monitor this at this time.      7. Bilateral lower extremity edema: no change in status:  will continue demadex  20 mg daily with k+ 20 meq twice daily   8. Constipation:  Worse will continue senna twice daily; miralax twice daily and  linzess 72 mcg daily   9. Urine retention: has foley will have her follow up with urology   10. Right lung nodule: 6 mm will repeat ct scan of chest in June 2019  11. Chronic pancreatitis:  Sequelae of chronic pancreatitis with multiple small pancreatic cysts most compatible with pseudocyst. Will not make changes  12. Neuroleptic-induced tardive dyskinesia: is stable will continue cogentin 0.5 mg nightly     MD is aware of resident's narcotic use and is in agreement with current plan of care. We will attempt to wean resident as apropriate    Synthia Innocent NP [facility]  Contact [phone] Monday through Friday 8am- 5pm  After hours call [phone]

## 2017-06-07 ENCOUNTER — Encounter: Payer: Self-pay | Admitting: Adult Health

## 2017-06-07 ENCOUNTER — Non-Acute Institutional Stay (SKILLED_NURSING_FACILITY): Payer: Medicaid Other | Admitting: Adult Health

## 2017-06-07 DIAGNOSIS — D649 Anemia, unspecified: Secondary | ICD-10-CM | POA: Diagnosis not present

## 2017-06-07 NOTE — Progress Notes (Signed)
Location:   [facility] Room Number: 204 B Place of Service:  SNF (31)   CODE STATUS: Full Code  No Known Allergies  Chief Complaint  Patient presents with  . Acute Visit    Lab Follow up    HPI:  She has had fobt negative in Dec and now has had one that is positive. Her hgb is stable. She dos have some external hemorrhoids present. She is unable to fully participate in the hpi or ros. There are no nursing concerns at this time. There are no reports of chest pain; shortness of breath or increased weakness present.   Past Medical History:  Diagnosis Date  . Acute deep vein thrombosis (DVT) of left femoral vein (HCC) 02/22/2015  . Alcohol abuse   . Arthritis   . Chronic constipation 08/26/2015  . Dementia with behavioral problem   . Diabetes mellitus without complication (HCC)   . Dysphagia    and aspiration risk  . 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 Seizures (HCC)     History reviewed. No pertinent surgical history.  Social History   Socioeconomic History  . Marital status: Unknown    Spouse name: Not on file  . Number of children: Not on file  . Years of education: Not on file  . Highest education level: Not on file  Social Needs  . Financial resource strain: Not on file  . Food insecurity - worry: Not on file  . Food insecurity - inability: Not on file  . Transportation needs - medical: Not on file  . Transportation needs - non-medical: Not on file  Occupational History  . Not on file  Tobacco Use  . Smoking status: Current Every Day Smoker    Packs/day: 0.50    Years: 15.00    Pack years: 7.50    Types: Cigarettes  . Smokeless tobacco: Never Used  Substance and Sexual Activity  . Alcohol use: Yes    Alcohol/week: 1.2 oz    Types: 2 Cans of beer per week    Comment: occ  . Drug use: No  . Sexual activity: Not Currently  Other Topics Concern  . Not on file  Social History Narrative  . Not on file   History reviewed. No pertinent family  history.    VITAL SIGNS BP (!) 142/70   Pulse 97   Temp 98.6 F (37 C)   Resp 18   Ht 5\' 3"  (1.6 m)   Wt 136 lb (61.7 kg)   LMP 03/25/2015 (Approximate)   SpO2 97%   BMI 24.09 kg/m   Outpatient Encounter Medications as of 06/07/2017  Medication Sig  . Amino Acids-Protein Hydrolys (FEEDING SUPPLEMENT, PRO-STAT SUGAR FREE 64,) LIQD Take 30 mLs by mouth 2 (two) times daily.  . benztropine (COGENTIN) 0.5 MG tablet Take 0.5 mg by mouth at bedtime.  . cholecalciferol (VITAMIN D) 1000 UNITS tablet Take 1,000 Units by mouth daily. Reported on 10/28/2015  . diclofenac sodium (VOLTAREN) 1 % GEL Apply 2 g topically 4 (four) times daily. To both knees  . gabapentin (NEURONTIN) 300 MG capsule Take 3 capsules (900 mg total) by mouth 3 (three) times daily.  Marland Kitchen. linaclotide (LINZESS) 72 MCG capsule Take 72 mcg by mouth daily before breakfast.  . linagliptin (TRADJENTA) 5 MG TABS tablet Take 5 mg by mouth daily.  . Melatonin 3 MG TABS Take 3 mg by mouth at bedtime.   . metFORMIN (GLUCOPHAGE) 500 MG tablet Take 500 mg by  mouth daily.  . mirtazapine (REMERON) 15 MG tablet Take 7.5 mg by mouth at bedtime.  . Multiple Vitamins-Minerals (DECUBI-VITE) CAPS Take 2 capsules by mouth at bedtime.  . Nutritional Supplements (NUTRITIONAL SUPPLEMENT PO) House Supplement - Give house supplement with meals daily for caloric and weight support  . OLANZapine (ZYPREXA) 10 MG tablet Take 10 mg by mouth at bedtime.  . polyethylene glycol (MIRALAX / GLYCOLAX) packet Take 17 g by mouth 2 (two) times daily.  . potassium chloride SA (K-DUR,KLOR-CON) 20 MEQ tablet Take 20 mEq by mouth 2 (two) times daily.   . rivaroxaban (XARELTO) 20 MG TABS tablet Take 20 mg by mouth at bedtime.  . senna-docusate (SENOKOT-S) 8.6-50 MG tablet Take 1 tablet by mouth 2 (two) times daily.  Marland Kitchen thiamine 100 MG tablet Take 100 mg by mouth daily.  Marland Kitchen torsemide (DEMADEX) 20 MG tablet Take 20 mg by mouth daily.   . traMADol (ULTRAM) 50 MG tablet Take 1  tablet (50 mg total) by mouth every 6 (six) hours.   No facility-administered encounter medications on file as of 06/07/2017.      SIGNIFICANT DIAGNOSTIC EXAMS   PREVIOUS  10-02-14: left leg doppler: +dvt  08-08-16: left lower extremity doppler: + DVT  01-18-17: 2-d echo: EF 55-60%; left ventricle hypertrophy; mild relaxation abnormality   04-26-17: chest x-ray: Possible right upper lobe opacity which could represent infection or be secondary to osseous summation shadow and vascular crowding. If possible, consider PA radiograph. If not possible, consider repeat frontal AP radiograph without obliquity. Aortic Atherosclerosis  04-26-17: ct of abdomen and pelvis: 1. Large amount of retained large bowel stool, stool distended rectum associated with fecal impaction. 2. Sequelae of chronic pancreatitis with multiple small pancreatic cysts most compatible with pseudocyst. 3. Cholelithiasis and extra hepatic biliary dilatation without choledocholithiasis or CT findings of acute cholecystitis. 4. Distended urinary bladder seen with neurogenic bladder/bladder outlet obstruction. Recommend correlation with voiding. 5. 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 Aortic Atherosclerosis  04-26-17: ct of head: No CT evidence for acute intracranial abnormality.   04-28-17: 2-d echo:   - Left ventricle: The cavity size was mildly dilated. Wall thickness was normal. Systolic function was severely reduced. The estimated ejection fraction was in the range of 20% to 25%. Severe diffuse hypokinesis with regional variations. Possible disproportionately severe hypokinesis of the inferolateral, inferior, and inferoseptal myocardium. Features are consistent with a pseudonormal left ventricular filling pattern, with concomitant abnormal relaxation and increased filling pressure (grade 2 diastolic dysfunction).  04-29-17: ct of chest: 1. There is a 6 mm nodule in the right lung  base. Non-contrast chest CT at 6-12 months is recommended. If the nodule is stable at time of repeat CT, then future CT at 18-24 months (from today's scan) is considered optional for low-risk patients, but is recommended for high-risk patients. :  2. No CT correlate for the recent chest x-ray finding in the right mid lung. 3. No other abnormalities.  NO NEW EXAMS    LABS REVIEWED: PREVIOUS     07-27-16: hgb a1c 7.1; chol 153; ldl 66; trig 164; hdl 55  1-61-09: wbc 12.1; hgb 11.1; hct 34.8; mcv 86.6; plt 282; glucose 106; bun 14.0; creat 0.55; k+ 3.8; na++ 140; ca 10.1; liver normal albumin 4.4  01-04-17: wbc 10.7; hgb 12.3; hct 37.3; mcv 83.6; plt 227; glucose 120; bun 13.6; creat 0.58; k+ 3.7; na++ 140; ca 9.4  01-11-17: BNP 339.10 02-16-17: tsh 1.39; vit B 12: 1092;  folic 17.6  03-21-17: hgb a1c 7.8  04-20-17: wbc 12.4; hgb 11.9; hct 35.9; mcv 85.3; plt 268; glucose 136; bun 16.5; creat 0.60 ;k+ 3.3; na++ 138; liver normal albumin 4.1  04-26-17: wbc 18.5; hgb 11.9; hct 35.9; mcv 86.;3 plt 297; glucose 149; bun 24;creat 0.99; k+ 3.3; na++ 139; ca 9.6; liver normal albumin 3.7; ammonia 18; blood culture: no growth 04-27-17: urine culture: e-coli 04-28-17: wbc 12.9; hgb 8.7; hct 26.5; mcv 86.9; plt 206; glucose 110; bun 7; creat 0.61; k+ 3.5; na++ 144; ca 8.6; liver normal albumin 2.7 04-30-17: glucose 110; bun <5; creat 0.63; k+ 3.0; na++ 144; ca 8.6; mag 1.8; vit B 12: 1282; folate 33.0; iron 43; tibc 235. 05-02-17: wbc 12.4; hgb 11.9; hct 35.1; mcv 86.1 plt 453; glucose 120; bun 7.5; creat 0.57; k+ 4.2; na++ 141; ca 9.7; ast 63; alt 62; albumin 4.3;   TODAY:   05-12-17: FOBT: neg 05-17-17: glucose 140; bun 13.5; creat 0.56; k+ 3.8; na++ 137; ca 9.9; liver normal albumin 4.1  06-05-17: FOBT positive; c-diff: neg     Review of Systems  Unable to perform ROS: Dementia (confused )    Physical Exam  Constitutional: No distress.  Frail   Neck: No thyromegaly present.  Cardiovascular: Regular  rhythm, normal heart sounds and intact distal pulses.  Pulmonary/Chest: Effort normal and breath sounds normal. No respiratory distress.  Abdominal: Soft. Bowel sounds are normal. She exhibits distension. There is no tenderness.  Genitourinary:  Genitourinary Comments: Has foley   Musculoskeletal: She exhibits edema.  able to move all extremities  Leans further to the  left in wheelchair Has 2+ lower extremity edema bilaterally with the left worse than right   Legs are tender to touch     Lymphadenopathy:    She has no cervical adenopathy.  Neurological: She is alert.  Has bilateral hand tremors   Skin: Skin is warm and dry. She is not diaphoretic.  Psychiatric: She has a normal mood and affect.      ASSESSMENT/ PLAN:  TODAY:   1. Anemia: she is presently stable. Her hgb 11.9. Will repeat FOBT in one week and will repeat cbc.   MD is aware of resident's narcotic use and is in agreement with current plan of care. We will attempt to wean resident as apropriate   Synthia Innocent NP [facility]  Contact [phone] Monday through Friday 8am- 5pm  After hours call [phone]

## 2017-06-08 LAB — CBC AND DIFFERENTIAL
HCT: 32 — AB (ref 36–46)
Hemoglobin: 10.7 — AB (ref 12.0–16.0)
NEUTROS ABS: 8
PLATELETS: 381 (ref 150–399)
WBC: 11

## 2017-06-09 ENCOUNTER — Encounter: Payer: Self-pay | Admitting: Internal Medicine

## 2017-06-09 DIAGNOSIS — R911 Solitary pulmonary nodule: Secondary | ICD-10-CM | POA: Insufficient documentation

## 2017-06-21 ENCOUNTER — Non-Acute Institutional Stay (SKILLED_NURSING_FACILITY): Payer: Medicaid Other | Admitting: Adult Health

## 2017-06-21 ENCOUNTER — Encounter: Payer: Self-pay | Admitting: Adult Health

## 2017-06-21 DIAGNOSIS — Z20828 Contact with and (suspected) exposure to other viral communicable diseases: Secondary | ICD-10-CM

## 2017-07-02 ENCOUNTER — Non-Acute Institutional Stay (SKILLED_NURSING_FACILITY): Payer: Medicaid Other | Admitting: Internal Medicine

## 2017-07-02 DIAGNOSIS — Z79899 Other long term (current) drug therapy: Secondary | ICD-10-CM

## 2017-07-02 DIAGNOSIS — F10959 Alcohol use, unspecified with alcohol-induced psychotic disorder, unspecified: Secondary | ICD-10-CM

## 2017-07-02 DIAGNOSIS — R600 Localized edema: Secondary | ICD-10-CM | POA: Diagnosis not present

## 2017-07-02 DIAGNOSIS — R339 Retention of urine, unspecified: Secondary | ICD-10-CM | POA: Diagnosis not present

## 2017-07-02 DIAGNOSIS — R627 Adult failure to thrive: Secondary | ICD-10-CM

## 2017-07-02 DIAGNOSIS — Z86718 Personal history of other venous thrombosis and embolism: Secondary | ICD-10-CM

## 2017-07-02 DIAGNOSIS — F0150 Vascular dementia without behavioral disturbance: Secondary | ICD-10-CM | POA: Diagnosis not present

## 2017-07-02 DIAGNOSIS — E43 Unspecified severe protein-calorie malnutrition: Secondary | ICD-10-CM

## 2017-07-03 LAB — CBC AND DIFFERENTIAL
HCT: 34 — AB (ref 36–46)
HCT: 34 — AB (ref 36–46)
HEMOGLOBIN: 11.1 — AB (ref 12.0–16.0)
Hemoglobin: 11.1 — AB (ref 12.0–16.0)
PLATELETS: 295 (ref 150–399)
Platelets: 295 (ref 150–399)
WBC: 11.6
WBC: 11.6

## 2017-07-03 LAB — BASIC METABOLIC PANEL
BUN: 13 (ref 4–21)
CREATININE: 0.5 (ref 0.5–1.1)
Glucose: 126
Potassium: 3.8 (ref 3.4–5.3)
Sodium: 138 (ref 137–147)

## 2017-07-03 LAB — HEMOGLOBIN A1C: Hemoglobin A1C: 7.2

## 2017-07-04 ENCOUNTER — Other Ambulatory Visit: Payer: Self-pay

## 2017-07-04 MED ORDER — TRAMADOL HCL 50 MG PO TABS: 50.0000 mg | ORAL_TABLET | Freq: Four times a day (QID) | ORAL | 0 refills | Status: DC

## 2017-07-07 NOTE — Progress Notes (Signed)
Location:   [facility] Room Number: 204 B Place of Service:  SNF (31)   CODE STATUS: full code   No Known Allergies  Chief Complaint  Patient presents with  . Acute Visit    Flu Like Symptoms    HPI:  She has been exposed to influenza A as there is an outbreak on this unit. She is unable to unable to fully participate in the hpi or ros; but does deny any cough or body aches. There are no reports of fever present; there are no reports of changes in appetite present.   Past Medical History:  Diagnosis Date  . Acute deep vein thrombosis (DVT) of left femoral vein (HCC) 02/22/2015  . Alcohol abuse   . Arthritis   . Chronic constipation 08/26/2015  . Dementia with behavioral problem   . Diabetes mellitus without complication (HCC)   . Dysphagia    and aspiration risk  . 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 Seizures (HCC)     History reviewed. No pertinent surgical history.  Social History   Socioeconomic History  . Marital status: Unknown    Spouse name: Not on file  . Number of children: Not on file  . Years of education: Not on file  . Highest education level: Not on file  Social Needs  . Financial resource strain: Not on file  . Food insecurity - worry: Not on file  . Food insecurity - inability: Not on file  . Transportation needs - medical: Not on file  . Transportation needs - non-medical: Not on file  Occupational History  . Not on file  Tobacco Use  . Smoking status: Current Every Day Smoker    Packs/day: 0.50    Years: 15.00    Pack years: 7.50    Types: Cigarettes  . Smokeless tobacco: Never Used  Substance and Sexual Activity  . Alcohol use: Yes    Alcohol/week: 1.2 oz    Types: 2 Cans of beer per week    Comment: occ  . Drug use: No  . Sexual activity: Not Currently  Other Topics Concern  . Not on file  Social History Narrative  . Not on file   History reviewed. No pertinent family history.    VITAL SIGNS BP 110/80   Pulse  88   Temp 98.8 F (37.1 C)   Resp 16   Ht 5\' 3"  (1.6 m)   Wt 136 lb (61.7 kg)   LMP 03/25/2015 (Approximate)   SpO2 95%   BMI 24.09 kg/m   Outpatient Encounter Medications as of 06/21/2017  Medication Sig  . Amino Acids-Protein Hydrolys (FEEDING SUPPLEMENT, PRO-STAT SUGAR FREE 64,) LIQD Take 30 mLs by mouth 2 (two) times daily.  . benztropine (COGENTIN) 0.5 MG tablet Take 0.5 mg by mouth at bedtime.  . cholecalciferol (VITAMIN D) 1000 UNITS tablet Take 1,000 Units by mouth daily. Reported on 10/28/2015  . linaclotide (LINZESS) 72 MCG capsule Take 72 mcg by mouth daily before breakfast.  . linagliptin (TRADJENTA) 5 MG TABS tablet Take 5 mg by mouth daily.  . Melatonin 3 MG TABS Take 3 mg by mouth at bedtime.   . metFORMIN (GLUCOPHAGE) 500 MG tablet Take 500 mg by mouth daily.  . Multiple Vitamins-Minerals (DECUBI-VITE) CAPS Take 2 capsules by mouth at bedtime.  Marland Kitchen OLANZapine (ZYPREXA) 7.5 MG tablet Take 7.5 mg by mouth at bedtime.   . polyethylene glycol (MIRALAX / GLYCOLAX) packet Take 17 g by mouth 2 (  two) times daily.  . potassium chloride SA (K-DUR,KLOR-CON) 20 MEQ tablet Take 20 mEq by mouth 2 (two) times daily.   . rivaroxaban (XARELTO) 20 MG TABS tablet Take 20 mg by mouth at bedtime.  . senna-docusate (SENOKOT-S) 8.6-50 MG tablet Take 1 tablet by mouth 2 (two) times daily.  Marland Kitchen. thiamine 100 MG tablet Take 100 mg by mouth daily.  Marland Kitchen. torsemide (DEMADEX) 20 MG tablet Take 20 mg by mouth daily.   . Nutritional Supplements (NUTRITIONAL SUPPLEMENT PO) House Supplement - Give house supplement with meals daily for caloric and weight support   No facility-administered encounter medications on file as of 06/21/2017.      SIGNIFICANT DIAGNOSTIC EXAMS  PREVIOUS  10-02-14: left leg doppler: +dvt  08-08-16: left lower extremity doppler: + DVT  01-18-17: 2-d echo: EF 55-60%; left ventricle hypertrophy; mild relaxation abnormality   04-26-17: chest x-ray: Possible right upper lobe opacity  which could represent infection or be secondary to osseous summation shadow and vascular crowding. If possible, consider PA radiograph. If not possible, consider repeat frontal AP radiograph without obliquity. Aortic Atherosclerosis  04-26-17: ct of abdomen and pelvis: 1. Large amount of retained large bowel stool, stool distended rectum associated with fecal impaction. 2. Sequelae of chronic pancreatitis with multiple small pancreatic cysts most compatible with pseudocyst. 3. Cholelithiasis and extra hepatic biliary dilatation without choledocholithiasis or CT findings of acute cholecystitis. 4. Distended urinary bladder seen with neurogenic bladder/bladder outlet obstruction. Recommend correlation with voiding. 5. 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 Aortic Atherosclerosis  04-26-17: ct of head: No CT evidence for acute intracranial abnormality.   04-28-17: 2-d echo:   - Left ventricle: The cavity size was mildly dilated. Wall thickness was normal. Systolic function was severely reduced. The estimated ejection fraction was in the range of 20% to 25%. Severe diffuse hypokinesis with regional variations. Possible disproportionately severe hypokinesis of the inferolateral, inferior, and inferoseptal myocardium. Features are consistent with a pseudonormal left ventricular filling pattern, with concomitant abnormal relaxation and increased filling pressure (grade 2 diastolic dysfunction).  04-29-17: ct of chest: 1. There is a 6 mm nodule in the right lung base. Non-contrast chest CT at 6-12 months is recommended. If the nodule is stable at time of repeat CT, then future CT at 18-24 months (from today's scan) is considered optional for low-risk patients, but is recommended for high-risk patients. :  2. No CT correlate for the recent chest x-ray finding in the right mid lung. 3. No other abnormalities.  NO NEW EXAMS    LABS REVIEWED: PREVIOUS     07-27-16:  hgb a1c 7.1; chol 153; ldl 66; trig 164; hdl 55  4-09-814-27-18: wbc 12.1; hgb 11.1; hct 34.8; mcv 86.6; plt 282; glucose 106; bun 14.0; creat 0.55; k+ 3.8; na++ 140; ca 10.1; liver normal albumin 4.4  01-04-17: wbc 10.7; hgb 12.3; hct 37.3; mcv 83.6; plt 227; glucose 120; bun 13.6; creat 0.58; k+ 3.7; na++ 140; ca 9.4  01-11-17: BNP 339.10 02-16-17: tsh 1.39; vit B 12: 1092; folic 17.6  03-21-17: hgb a1c 7.8  04-20-17: wbc 12.4; hgb 11.9; hct 35.9; mcv 85.3; plt 268; glucose 136; bun 16.5; creat 0.60 ;k+ 3.3; na++ 138; liver normal albumin 4.1  04-26-17: wbc 18.5; hgb 11.9; hct 35.9; mcv 86.;3 plt 297; glucose 149; bun 24;creat 0.99; k+ 3.3; na++ 139; ca 9.6; liver normal albumin 3.7; ammonia 18; blood culture: no growth 04-27-17: urine culture: e-coli 04-28-17: wbc 12.9; hgb 8.7; hct 26.5;  mcv 86.9; plt 206; glucose 110; bun 7; creat 0.61; k+ 3.5; na++ 144; ca 8.6; liver normal albumin 2.7 04-30-17: glucose 110; bun <5; creat 0.63; k+ 3.0; na++ 144; ca 8.6; mag 1.8; vit B 12: 1282; folate 33.0; iron 43; tibc 235. 05-02-17: wbc 12.4; hgb 11.9; hct 35.1; mcv 86.1 plt 453; glucose 120; bun 7.5; creat 0.57; k+ 4.2; na++ 141; ca 9.7; ast 63; alt 62; albumin 4.3;  05-12-17: FOBT: neg 05-17-17: glucose 140; bun 13.5; creat 0.56; k+ 3.8; na++ 137; ca 9.9; liver normal albumin 4.1  06-05-17: FOBT positive; c-diff: neg  NO NEW LABS      Review of Systems  Unable to perform ROS: Dementia (confused )   Physical Exam  Constitutional: No distress.  Frail   Neck: No thyromegaly present.  Cardiovascular: Normal rate, regular rhythm, normal heart sounds and intact distal pulses.  Pulmonary/Chest: Effort normal and breath sounds normal. No respiratory distress.  Abdominal: Soft. Bowel sounds are normal. She exhibits distension. There is no tenderness.  Genitourinary:  Genitourinary Comments: Has foley   Musculoskeletal: She exhibits edema.  able to move all extremities  Leans further to the  left in  wheelchair Has 2+ lower extremity edema bilaterally with the left worse than right   Legs are tender to touch      Lymphadenopathy:    She has no cervical adenopathy.  Neurological: She is alert.  Skin: Skin is warm. She is not diaphoretic.  Psychiatric: She has a normal mood and affect.     ASSESSMENT/ PLAN:  TODAY:   1. Exposure to influenza: will begin tamiflu 75 mg daily for 10 days and will monitor her status.   MD is aware of resident's narcotic use and is in agreement with current plan of care. We will attempt to wean resident as apropriate   Synthia Innocent NP [facility]  Contact [phone] Monday through Friday 8am- 5pm  After hours call [phone]

## 2017-07-11 ENCOUNTER — Encounter: Payer: Self-pay | Admitting: Adult Health

## 2017-07-11 ENCOUNTER — Non-Acute Institutional Stay (SKILLED_NURSING_FACILITY): Payer: Medicaid Other | Admitting: Adult Health

## 2017-07-11 DIAGNOSIS — F1027 Alcohol dependence with alcohol-induced persisting dementia: Secondary | ICD-10-CM | POA: Diagnosis not present

## 2017-07-11 DIAGNOSIS — F10959 Alcohol use, unspecified with alcohol-induced psychotic disorder, unspecified: Secondary | ICD-10-CM

## 2017-07-11 DIAGNOSIS — R410 Disorientation, unspecified: Secondary | ICD-10-CM

## 2017-07-11 LAB — CBC AND DIFFERENTIAL
HCT: 33 — AB (ref 36–46)
HEMOGLOBIN: 11.1 — AB (ref 12.0–16.0)
Neutrophils Absolute: 6
PLATELETS: 397 (ref 150–399)
WBC: 11.6

## 2017-07-11 LAB — BASIC METABOLIC PANEL
BUN: 11 (ref 4–21)
CREATININE: 0.6 (ref 0.5–1.1)
Glucose: 90
POTASSIUM: 3.7 (ref 3.4–5.3)
Sodium: 141 (ref 137–147)

## 2017-07-11 LAB — HEPATIC FUNCTION PANEL
ALK PHOS: 134 — AB (ref 25–125)
ALT: 16 (ref 7–35)
AST: 22 (ref 13–35)
Bilirubin, Total: 0.3

## 2017-07-11 NOTE — Progress Notes (Signed)
Location:   [facility] Room Number: 204 Place of Service:  SNF (31)   CODE STATUS: full code   No Known Allergies  Chief Complaint  Patient presents with  . Acute Visit    agitation     HPI:  For the past 48 hours she has become more agitated; trying to leave facility; fearful of being hurt and is trying to find her brother. She cannot be consoled; and cannot be redirected. She is unable to focus on anything or anyone around her except to find her brother. She is unable to participate in the hpi or ros. There are no reports of fever present; she is tachycardic and she is shaking more than normal.   Past Medical History:  Diagnosis Date  . Acute deep vein thrombosis (DVT) of left femoral vein (HCC) 02/22/2015  . Alcohol abuse   . Arthritis   . Chronic constipation 08/26/2015  . Dementia with behavioral problem   . Diabetes mellitus without complication (HCC)   . Dysphagia    and aspiration risk  . 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 Seizures (HCC)     History reviewed. No pertinent surgical history.  Social History   Socioeconomic History  . Marital status: Unknown    Spouse name: Not on file  . Number of children: Not on file  . Years of education: Not on file  . Highest education level: Not on file  Social Needs  . Financial resource strain: Not on file  . Food insecurity - worry: Not on file  . Food insecurity - inability: Not on file  . Transportation needs - medical: Not on file  . Transportation needs - non-medical: Not on file  Occupational History  . Not on file  Tobacco Use  . Smoking status: Current Every Day Smoker    Packs/day: 0.50    Years: 15.00    Pack years: 7.50    Types: Cigarettes  . Smokeless tobacco: Never Used  Substance and Sexual Activity  . Alcohol use: Yes    Alcohol/week: 1.2 oz    Types: 2 Cans of beer per week    Comment: occ  . Drug use: No  . Sexual activity: Not Currently  Other Topics Concern  . Not  on file  Social History Narrative  . Not on file   History reviewed. No pertinent family history.    VITAL SIGNS BP 130/78   Pulse (!) 120   Temp (!) 96.9 F (36.1 C)   Resp 18   Ht 5\' 3"  (1.6 m)   Wt 146 lb (66.2 kg)   LMP 03/25/2015 (Approximate)   SpO2 95%   BMI 25.86 kg/m    Outpatient Encounter Medications as of 07/11/2017  Medication Sig  . Amino Acids-Protein Hydrolys (FEEDING SUPPLEMENT, PRO-STAT SUGAR FREE 64,) LIQD Take 30 mLs by mouth 2 (two) times daily.  . benztropine (COGENTIN) 0.5 MG tablet Take 0.5 mg by mouth at bedtime.  . cholecalciferol (VITAMIN D) 1000 UNITS tablet Take 1,000 Units by mouth daily. Reported on 10/28/2015  . linaclotide (LINZESS) 72 MCG capsule Take 72 mcg by mouth daily before breakfast.  . linagliptin (TRADJENTA) 5 MG TABS tablet Take 5 mg by mouth daily.  . Melatonin 3 MG TABS Take 3 mg by mouth at bedtime.   . metFORMIN (GLUCOPHAGE) 500 MG tablet Take 500 mg by mouth daily.  . Multiple Vitamins-Minerals (DECUBI-VITE) CAPS Take 2 capsules by mouth at bedtime.  Marland Kitchen  Nutritional Supplements (NUTRITIONAL SUPPLEMENT PO) House Supplement - Give house supplement with meals daily for caloric and weight support  . OLANZapine (ZYPREXA) 7.5 MG tablet Take 7.5 mg by mouth at bedtime.   . polyethylene glycol (MIRALAX / GLYCOLAX) packet Take 17 g by mouth 2 (two) times daily.  . potassium chloride SA (K-DUR,KLOR-CON) 20 MEQ tablet Take 20 mEq by mouth 2 (two) times daily.   . rivaroxaban (XARELTO) 20 MG TABS tablet Take 20 mg by mouth at bedtime.  . senna-docusate (SENOKOT-S) 8.6-50 MG tablet Take 1 tablet by mouth 2 (two) times daily.  Marland Kitchen. thiamine 100 MG tablet Take 100 mg by mouth daily.  Marland Kitchen. torsemide (DEMADEX) 20 MG tablet Take 20 mg by mouth daily.   . traMADol (ULTRAM) 50 MG tablet Take 1 tablet (50 mg total) by mouth every 6 (six) hours.   No facility-administered encounter medications on file as of 07/11/2017.      SIGNIFICANT DIAGNOSTIC  EXAMS   PREVIOUS  10-02-14: left leg doppler: +dvt  08-08-16: left lower extremity doppler: + DVT  04-26-17: chest x-ray: Possible right upper lobe opacity which could represent infection or be secondary to osseous summation shadow and vascular crowding. If possible, consider PA radiograph. If not possible, consider repeat frontal AP radiograph without obliquity. Aortic Atherosclerosis  04-26-17: ct of abdomen and pelvis: 1. Large amount of retained large bowel stool, stool distended rectum associated with fecal impaction. 2. Sequelae of chronic pancreatitis with multiple small pancreatic cysts most compatible with pseudocyst. 3. Cholelithiasis and extra hepatic biliary dilatation without choledocholithiasis or CT findings of acute cholecystitis. 4. Distended urinary bladder seen with neurogenic bladder/bladder outlet obstruction. Recommend correlation with voiding. 5. 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 Aortic Atherosclerosis  04-26-17: ct of head: No CT evidence for acute intracranial abnormality.   04-28-17: 2-d echo:   - Left ventricle: The cavity size was mildly dilated. Wall thickness was normal. Systolic function was severely reduced. The estimated ejection fraction was in the range of 20% to 25%. Severe diffuse hypokinesis with regional variations. Possible disproportionately severe hypokinesis of the inferolateral, inferior, and inferoseptal myocardium. Features are consistent with a pseudonormal left ventricular filling pattern, with concomitant abnormal relaxation and increased filling pressure (grade 2 diastolic dysfunction).  04-29-17: ct of chest: 1. There is a 6 mm nodule in the right lung base. Non-contrast chest CT at 6-12 months is recommended. If the nodule is stable at time of repeat CT, then future CT at 18-24 months (from today's scan) is considered optional for low-risk patients, but is recommended for high-risk patients. :  2.  No CT correlate for the recent chest x-ray finding in the right mid lung. 3. No other abnormalities.  NO NEW EXAMS    LABS REVIEWED: PREVIOUS     07-27-16: hgb a1c 7.1; chol 153; ldl 66; trig 164; hdl 55  1-61-094-27-18: wbc 12.1; hgb 11.1; hct 34.8; mcv 86.6; plt 282; glucose 106; bun 14.0; creat 0.55; k+ 3.8; na++ 140; ca 10.1; liver normal albumin 4.4  01-04-17: wbc 10.7; hgb 12.3; hct 37.3; mcv 83.6; plt 227; glucose 120; bun 13.6; creat 0.58; k+ 3.7; na++ 140; ca 9.4  01-11-17: BNP 339.10 02-16-17: tsh 1.39; vit B 12: 1092; folic 17.6  03-21-17: hgb a1c 7.8  04-20-17: wbc 12.4; hgb 11.9; hct 35.9; mcv 85.3; plt 268; glucose 136; bun 16.5; creat 0.60 ;k+ 3.3; na++ 138; liver normal albumin 4.1  04-26-17: wbc 18.5; hgb 11.9; hct 35.9; mcv 86.;3  plt 297; glucose 149; bun 24;creat 0.99; k+ 3.3; na++ 139; ca 9.6; liver normal albumin 3.7; ammonia 18; blood culture: no growth 04-27-17: urine culture: e-coli 04-28-17: wbc 12.9; hgb 8.7; hct 26.5; mcv 86.9; plt 206; glucose 110; bun 7; creat 0.61; k+ 3.5; na++ 144; ca 8.6; liver normal albumin 2.7 04-30-17: glucose 110; bun <5; creat 0.63; k+ 3.0; na++ 144; ca 8.6; mag 1.8; vit B 12: 1282; folate 33.0; iron 43; tibc 235. 05-02-17: wbc 12.4; hgb 11.9; hct 35.1; mcv 86.1 plt 453; glucose 120; bun 7.5; creat 0.57; k+ 4.2; na++ 141; ca 9.7; ast 63; alt 62; albumin 4.3;  05-12-17: FOBT: neg 05-17-17: glucose 140; bun 13.5; creat 0.56; k+ 3.8; na++ 137; ca 9.9; liver normal albumin 4.1  06-05-17: FOBT positive; c-diff: neg  TODAY:   06-08-17: wbc 11.0; hgb 10.7; hct 31.8; mcv 83.0; plt 381     Review of Systems  Unable to perform ROS: Dementia (confused )    Physical Exam  Constitutional: She appears distressed.  Frail   Neck: Neck supple. No thyromegaly present.  Cardiovascular: Normal rate, regular rhythm and intact distal pulses.  Murmur heard. 1/6 systolic   Pulmonary/Chest: Effort normal and breath sounds normal. No respiratory distress.   Abdominal: Soft. Bowel sounds are normal. She exhibits no distension. There is no tenderness.  Genitourinary:  Genitourinary Comments: Has foley   Musculoskeletal: She exhibits edema.  Has 2+ lower extremity edema Is able to move all extremities Leans to the left when in wheelchair   Lymphadenopathy:    She has no cervical adenopathy.  Neurological: She is alert.  Skin: Skin is warm and dry. She is not diaphoretic.  Unstaged coccyx: 1.9 x 2.3 x 0.2 cm   Psychiatric:  Is agitated      ASSESSMENT/ PLAN:  TODAY:   1. 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 Acute delirium   Will begin her on ativan 0.5 mg twice daily to help her with agitation and anxiety Will increase her zyprexa to 10 mg nightly   Due to acute delirium will get ua/c&s; cbc; cmp   MD is aware of resident's narcotic use and is in agreement with current plan of care. We will attempt to wean resident as apropriate   Synthia Innocent NP [facility]  Contact [phone] Monday through Friday 8am- 5pm  After hours call [phone]

## 2017-07-13 ENCOUNTER — Non-Acute Institutional Stay (SKILLED_NURSING_FACILITY): Payer: Medicaid Other | Admitting: Adult Health

## 2017-07-13 ENCOUNTER — Encounter: Payer: Self-pay | Admitting: Adult Health

## 2017-07-13 DIAGNOSIS — E1142 Type 2 diabetes mellitus with diabetic polyneuropathy: Secondary | ICD-10-CM

## 2017-07-13 DIAGNOSIS — R600 Localized edema: Secondary | ICD-10-CM

## 2017-07-13 DIAGNOSIS — F419 Anxiety disorder, unspecified: Secondary | ICD-10-CM

## 2017-07-13 DIAGNOSIS — F10950 Alcohol use, unspecified with alcohol-induced psychotic disorder with delusions: Secondary | ICD-10-CM | POA: Diagnosis not present

## 2017-07-13 NOTE — Progress Notes (Signed)
Location:  [facility] ([facility] Room Number: 204 B Place of Service:  SNF (31)   CODE STATUS: Full Code  No Known Allergies  Chief Complaint  Patient presents with  . Acute Visit    Resident is being seen for diabetes management    HPI:  Her cbgs are good. She continues to have frequent episodes of acting out; becoming agitated; yelling; unable to be redirected and consoled. She is tolerating her increased zyprexa without difficulty. There are no reports of missed medications. The nursing staff and I have spoken and feel as though her ativan dose is not enough to help with her anxiety.   Past Medical History:  Diagnosis Date  . Acute deep vein thrombosis (DVT) of left femoral vein ([place]) 02/22/2015  . Alcohol abuse   . Arthritis   . Chronic constipation 08/26/2015  . Dementia with behavioral problem   . Diabetes mellitus without complication ([place])   . Dysphagia    and aspiration risk  . 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 Seizures ([place])     History reviewed. No pertinent surgical history.  Social History   Socioeconomic History  . Marital status: Unknown    Spouse name: Not on file  . Number of children: Not on file  . Years of education: Not on file  . Highest education level: Not on file  Social Needs  . Financial resource strain: Not on file  . Food insecurity - worry: Not on file  . Food insecurity - inability: Not on file  . Transportation needs - medical: Not on file  . Transportation needs - non-medical: Not on file  Occupational History  . Not on file  Tobacco Use  . Smoking status: Former Smoker    Packs/day: 0.50    Years: 15.00    Pack years: 7.50    Types: Cigarettes  . Smokeless tobacco: Never Used  Substance and Sexual Activity  . Alcohol use: Yes    Alcohol/week: 1.2 oz    Types: 2 Cans of beer per week    Comment: occ  . Drug use: No  . Sexual activity: Not Currently  Other Topics Concern  . Not on file  Social History Narrative    . Not on file   History reviewed. No pertinent family history.    VITAL SIGNS BP 110/80   Pulse 88   Temp 98.8 F (37.1 C)   Resp 16   Ht 5' 3"  (1.6 m)   Wt 146 lb (66.2 kg)   LMP 03/25/2015 (Approximate)   SpO2 98%   BMI 25.86 kg/m    Outpatient Encounter Medications as of 07/13/2017  Medication Sig  . Amino Acids-Protein Hydrolys (FEEDING SUPPLEMENT, PRO-STAT SUGAR FREE 64,) LIQD Take 30 mLs by mouth 2 (two) times daily.  . benztropine (COGENTIN) 0.5 MG tablet Take 0.5 mg by mouth at bedtime.  . cholecalciferol (VITAMIN D) 1000 UNITS tablet Take 1,000 Units by mouth daily. Reported on 10/28/2015  . clonazePAM (KLONOPIN) 1 MG tablet Take 1 mg by mouth 2 (two) times daily.  . diclofenac sodium (VOLTAREN) 1 % GEL Apply to both knees topically four times a day for pain.  Marland Kitchen gabapentin (NEURONTIN) 300 MG capsule Take 300 mg by mouth 3 (three) times daily.  Marland Kitchen linaclotide (LINZESS) 72 MCG capsule Take 72 mcg by mouth daily before breakfast.  . linagliptin (TRADJENTA) 5 MG TABS tablet Take 5 mg by mouth daily.  Marland Kitchen LORazepam (ATIVAN) 0.5 MG tablet Take 0.5  mg by mouth 2 (two) times daily.  . Melatonin 3 MG TABS Take 3 mg by mouth at bedtime.   . metFORMIN (GLUCOPHAGE) 500 MG tablet Take 500 mg by mouth daily.  . Multiple Vitamins-Minerals (DECUBI-VITE) CAPS Take 2 capsules by mouth at bedtime.  . Nutritional Supplements (NUTRITIONAL SUPPLEMENT PO) House Supplement - Give house supplement with meals daily for caloric and weight support  . OLANZapine (ZYPREXA) 7.5 MG tablet Take 7.5 mg by mouth at bedtime.   . polyethylene glycol (MIRALAX / GLYCOLAX) packet Take 17 g by mouth 2 (two) times daily.  . potassium chloride SA (K-DUR,KLOR-CON) 20 MEQ tablet Take 20 mEq by mouth 2 (two) times daily.   . rivaroxaban (XARELTO) 20 MG TABS tablet Take 20 mg by mouth at bedtime.  . senna-docusate (SENOKOT-S) 8.6-50 MG tablet Take 1 tablet by mouth 2 (two) times daily.  Marland Kitchen thiamine 100 MG tablet Take  100 mg by mouth daily.  Marland Kitchen torsemide (DEMADEX) 20 MG tablet Take 20 mg by mouth daily.   . traMADol (ULTRAM) 50 MG tablet Take 1 tablet (50 mg total) by mouth every 6 (six) hours.   No facility-administered encounter medications on file as of 07/13/2017.      SIGNIFICANT DIAGNOSTIC EXAMS  PREVIOUS  10-02-14: left leg doppler: +dvt  08-08-16: left lower extremity doppler: + DVT  04-26-17: chest x-ray: Possible right upper lobe opacity which could represent infection or be secondary to osseous summation shadow and vascular crowding. If possible, consider PA radiograph. If not possible, consider repeat frontal AP radiograph without obliquity. Aortic Atherosclerosis  04-26-17: ct of abdomen and pelvis: 1. Large amount of retained large bowel stool, stool distended rectum associated with fecal impaction. 2. Sequelae of chronic pancreatitis with multiple small pancreatic cysts most compatible with pseudocyst. 3. Cholelithiasis and extra hepatic biliary dilatation without choledocholithiasis or CT findings of acute cholecystitis. 4. Distended urinary bladder seen with neurogenic bladder/bladder outlet obstruction. Recommend correlation with voiding. 5. 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 Aortic Atherosclerosis  04-26-17: ct of head: No CT evidence for acute intracranial abnormality.   04-28-17: 2-d echo:   - Left ventricle: The cavity size was mildly dilated. Wall thickness was normal. Systolic function was severely reduced. The estimated ejection fraction was in the range of 20% to 25%. Severe diffuse hypokinesis with regional variations. Possible disproportionately severe hypokinesis of the inferolateral, inferior, and inferoseptal myocardium. Features are consistent with a pseudonormal left ventricular filling pattern, with concomitant abnormal relaxation and increased filling pressure (grade 2 diastolic dysfunction).  04-29-17: ct of chest: 1. There  is a 6 mm nodule in the right lung base. Non-contrast chest CT at 6-12 months is recommended. If the nodule is stable at time of repeat CT, then future CT at 18-24 months (from today's scan) is considered optional for low-risk patients, but is recommended for high-risk patients. :  2. No CT correlate for the recent chest x-ray finding in the right mid lung. 3. No other abnormalities.  NO NEW EXAMS    LABS REVIEWED: PREVIOUS     07-27-16: hgb a1c 7.1; chol 153; ldl 66; trig 164; hdl 55  09-22-16: wbc 12.1; hgb 11.1; hct 34.8; mcv 86.6; plt 282; glucose 106; bun 14.0; creat 0.55; k+ 3.8; na++ 140; ca 10.1; liver normal albumin 4.4  01-04-17: wbc 10.7; hgb 12.3; hct 37.3; mcv 83.6; plt 227; glucose 120; bun 13.6; creat 0.58; k+ 3.7; na++ 140; ca 9.4  01-11-17: BNP 339.10 02-16-17: tsh  1.39; vit B 12: 0511; folic 02.1  11-73-56: hgb a1c 7.8  04-20-17: wbc 12.4; hgb 11.9; hct 35.9; mcv 85.3; plt 268; glucose 136; bun 16.5; creat 0.60 ;k+ 3.3; na++ 138; liver normal albumin 4.1  04-26-17: wbc 18.5; hgb 11.9; hct 35.9; mcv 86.;3 plt 297; glucose 149; bun 24;creat 0.99; k+ 3.3; na++ 139; ca 9.6; liver normal albumin 3.7; ammonia 18; blood culture: no growth 04-27-17: urine culture: e-coli 04-28-17: wbc 12.9; hgb 8.7; hct 26.5; mcv 86.9; plt 206; glucose 110; bun 7; creat 0.61; k+ 3.5; na++ 144; ca 8.6; liver normal albumin 2.7 04-30-17: glucose 110; bun <5; creat 0.63; k+ 3.0; na++ 144; ca 8.6; mag 1.8; vit B 12: 1282; folate 33.0; iron 43; tibc 235. 05-02-17: wbc 12.4; hgb 11.9; hct 35.1; mcv 86.1 plt 453; glucose 120; bun 7.5; creat 0.57; k+ 4.2; na++ 141; ca 9.7; ast 63; alt 62; albumin 4.3;  05-12-17: FOBT: neg 05-17-17: glucose 140; bun 13.5; creat 0.56; k+ 3.8; na++ 137; ca 9.9; liver normal albumin 4.1  06-05-17: FOBT positive; c-diff: neg 06-08-17: wbc 11.0; hgb 10.7; hct 31.8; mcv 83.0; plt 381  TODAY:   07-11-17: wbc 11.4; hgb 11.1; hct 32.5 ;mcv 79.6; plt 397; glucose 90; bun 10.7; creat 0.62; k+  3.7; na++ 141; ca 9.4; alk phos 134; albumin 4.2; urine culture: 65,000 multiple bacteria    Lymph# 4.2    Review of Systems  Unable to perform ROS: Dementia (confused )     Physical Exam  Constitutional: No distress.  Frail   Neck: No thyromegaly present.  Cardiovascular: Normal rate, regular rhythm and intact distal pulses.  Murmur heard. Frail   Pulmonary/Chest: Effort normal and breath sounds normal. No respiratory distress.  Abdominal: Soft. Bowel sounds are normal. She exhibits no distension.  Genitourinary:  Genitourinary Comments: Has foley   Musculoskeletal: She exhibits edema.  Has 2+ lower extremity edema Is able to move all extremities Leans to the left when in wheelchair    Lymphadenopathy:    She has no cervical adenopathy.  Neurological: She is alert.  Skin: Skin is warm and dry. She is not diaphoretic.  Unstaged coccyx: 1.3 x 2.2 x 0.1 cm   Psychiatric: She has a normal mood and affect.     ASSESSMENT/ PLAN:  TODAY:   1. Diabetes mellitus with neurological manifestations 2. 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 Anxiety  4. Bilateral lower extremity edema  Will use knee high ted hose Will check cbg daily Will begin lipitor 10 mg daily for dyslipidemia Will check vdrl; lipids; sed rate crp urine for micro-albumin     MD is aware of resident's narcotic use and is in agreement with current plan of care. We will attempt to wean resident as apropriate   Ok Edwards NP [facility]  Contact [phone] Monday through Friday 8am- 5pm  After hours call [phone]

## 2017-07-16 LAB — LIPID PANEL
Cholesterol: 132 (ref 0–200)
HDL: 56 (ref 35–70)
LDL CALC: 60
Triglycerides: 81 (ref 40–160)

## 2017-07-16 LAB — MICROALBUMIN, URINE
MICROALB UR: 367.8
Microalb, Ur: 64.5

## 2017-07-19 ENCOUNTER — Encounter: Payer: Self-pay | Admitting: Adult Health

## 2017-07-19 ENCOUNTER — Non-Acute Institutional Stay (SKILLED_NURSING_FACILITY): Payer: Medicaid Other | Admitting: Adult Health

## 2017-07-19 DIAGNOSIS — E1129 Type 2 diabetes mellitus with other diabetic kidney complication: Secondary | ICD-10-CM

## 2017-07-19 DIAGNOSIS — R809 Proteinuria, unspecified: Secondary | ICD-10-CM | POA: Diagnosis not present

## 2017-07-19 NOTE — Progress Notes (Signed)
Location:   [place] Room Number: 269 SWNIO of Service:  SNF (31)   CODE STATUS: full code   No Known Allergies  Chief Complaint  Patient presents with  . Acute Visit    follow up lab work     HPI:    Past Medical History:  Diagnosis Date  . Acute deep vein thrombosis (DVT) of left femoral vein ([place]) 02/22/2015  . Alcohol abuse   . Arthritis   . Chronic constipation 08/26/2015  . Dementia with behavioral problem   . Diabetes mellitus without complication ([place])   . Dysphagia    and aspiration risk  . 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 Seizures ([place])     History reviewed. No pertinent surgical history.  Social History   Socioeconomic History  . Marital status: Unknown    Spouse name: Not on file  . Number of children: Not on file  . Years of education: Not on file  . Highest education level: Not on file  Social Needs  . Financial resource strain: Not on file  . Food insecurity - worry: Not on file  . Food insecurity - inability: Not on file  . Transportation needs - medical: Not on file  . Transportation needs - non-medical: Not on file  Occupational History  . Not on file  Tobacco Use  . Smoking status: Former Smoker    Packs/day: 0.50    Years: 15.00    Pack years: 7.50    Types: Cigarettes  . Smokeless tobacco: Never Used  Substance and Sexual Activity  . Alcohol use: Yes    Alcohol/week: 1.2 oz    Types: 2 Cans of beer per week    Comment: occ  . Drug use: No  . Sexual activity: Not Currently  Other Topics Concern  . Not on file  Social History Narrative  . Not on file   History reviewed. No pertinent family history.    VITAL SIGNS BP 110/70   Pulse 78   Temp 97.8 F (36.6 C)   Resp 16   Ht 5' 3"  (1.6 m)   Wt 136 lb (61.7 kg)   LMP 03/25/2015 (Approximate)   SpO2 98%   BMI 24.09 kg/m    Outpatient Encounter Medications as of 07/19/2017  Medication Sig  . Amino Acids-Protein Hydrolys (FEEDING SUPPLEMENT,  PRO-STAT SUGAR FREE 64,) LIQD Take 30 mLs by mouth 2 (two) times daily.  . benztropine (COGENTIN) 0.5 MG tablet Take 0.5 mg by mouth at bedtime.  . cholecalciferol (VITAMIN D) 1000 UNITS tablet Take 1,000 Units by mouth daily. Reported on 10/28/2015  . diclofenac sodium (VOLTAREN) 1 % GEL Apply 2 g topically 4 (four) times daily. Apply to both knees topically four times a day for pain.   Marland Kitchen gabapentin (NEURONTIN) 300 MG capsule Take 300 mg by mouth 3 (three) times daily.  Marland Kitchen linaclotide (LINZESS) 72 MCG capsule Take 72 mcg by mouth daily before breakfast.  . linagliptin (TRADJENTA) 5 MG TABS tablet Take 5 mg by mouth daily.  Marland Kitchen LORazepam (ATIVAN) 1 MG tablet Take 1 mg by mouth 2 (two) times daily.  . Melatonin 3 MG TABS Take 3 mg by mouth at bedtime.   . metFORMIN (GLUCOPHAGE) 500 MG tablet Take 500 mg by mouth daily.  . Multiple Vitamins-Minerals (DECUBI-VITE) CAPS Take 2 capsules by mouth at bedtime.  . Nutritional Supplements (NUTRITIONAL SUPPLEMENT PO) House Supplement - Give house supplement with meals daily for caloric and  weight support  . OLANZapine (ZYPREXA) 10 MG tablet Take 10 mg by mouth at bedtime.  . polyethylene glycol (MIRALAX / GLYCOLAX) packet Take 17 g by mouth 2 (two) times daily.  . potassium chloride SA (K-DUR,KLOR-CON) 20 MEQ tablet Take 20 mEq by mouth 2 (two) times daily.   . rivaroxaban (XARELTO) 20 MG TABS tablet Take 20 mg by mouth at bedtime.  . senna-docusate (SENOKOT-S) 8.6-50 MG tablet Take 1 tablet by mouth 2 (two) times daily.  Marland Kitchen thiamine 100 MG tablet Take 100 mg by mouth daily.  Marland Kitchen torsemide (DEMADEX) 20 MG tablet Take 20 mg by mouth daily.   . traMADol (ULTRAM) 50 MG tablet Take 1 tablet (50 mg total) by mouth every 6 (six) hours.  . [DISCONTINUED] clonazePAM (KLONOPIN) 1 MG tablet Take 1 mg by mouth 2 (two) times daily.  . [DISCONTINUED] LORazepam (ATIVAN) 0.5 MG tablet Take 0.5 mg by mouth 2 (two) times daily.  . [DISCONTINUED] OLANZapine (ZYPREXA) 7.5 MG tablet  Take 7.5 mg by mouth at bedtime.    No facility-administered encounter medications on file as of 07/19/2017.      SIGNIFICANT DIAGNOSTIC EXAMS  PREVIOUS  10-02-14: left leg doppler: +dvt  08-08-16: left lower extremity doppler: + DVT  04-26-17: chest x-ray: Possible right upper lobe opacity which could represent infection or be secondary to osseous summation shadow and vascular crowding. If possible, consider PA radiograph. If not possible, consider repeat frontal AP radiograph without obliquity. Aortic Atherosclerosis  04-26-17: ct of abdomen and pelvis: 1. Large amount of retained large bowel stool, stool distended rectum associated with fecal impaction. 2. Sequelae of chronic pancreatitis with multiple small pancreatic cysts most compatible with pseudocyst. 3. Cholelithiasis and extra hepatic biliary dilatation without choledocholithiasis or CT findings of acute cholecystitis. 4. Distended urinary bladder seen with neurogenic bladder/bladder outlet obstruction. Recommend correlation with voiding. 5. 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 Aortic Atherosclerosis  04-26-17: ct of head: No CT evidence for acute intracranial abnormality.   04-28-17: 2-d echo:   - Left ventricle: The cavity size was mildly dilated. Wall thickness was normal. Systolic function was severely reduced. The estimated ejection fraction was in the range of 20% to 25%. Severe diffuse hypokinesis with regional variations. Possible disproportionately severe hypokinesis of the inferolateral, inferior, and inferoseptal myocardium. Features are consistent with a pseudonormal left ventricular filling pattern, with concomitant abnormal relaxation and increased filling pressure (grade 2 diastolic dysfunction).  04-29-17: ct of chest: 1. There is a 6 mm nodule in the right lung base. Non-contrast chest CT at 6-12 months is recommended. If the nodule is stable at time of repeat CT, then  future CT at 18-24 months (from today's scan) is considered optional for low-risk patients, but is recommended for high-risk patients. :  2. No CT correlate for the recent chest x-ray finding in the right mid lung. 3. No other abnormalities.  NO NEW EXAMS    LABS REVIEWED: PREVIOUS     07-27-16: hgb a1c 7.1; chol 153; ldl 66; trig 164; hdl 55  09-22-16: wbc 12.1; hgb 11.1; hct 34.8; mcv 86.6; plt 282; glucose 106; bun 14.0; creat 0.55; k+ 3.8; na++ 140; ca 10.1; liver normal albumin 4.4  01-04-17: wbc 10.7; hgb 12.3; hct 37.3; mcv 83.6; plt 227; glucose 120; bun 13.6; creat 0.58; k+ 3.7; na++ 140; ca 9.4  01-11-17: BNP 339.10 02-16-17: tsh 1.39; vit B 12: 7078; folic 67.5  44-92-01: hgb a1c 7.8  04-20-17: wbc 12.4; hgb  11.9; hct 35.9; mcv 85.3; plt 268; glucose 136; bun 16.5; creat 0.60 ;k+ 3.3; na++ 138; liver normal albumin 4.1  04-26-17: wbc 18.5; hgb 11.9; hct 35.9; mcv 86.;3 plt 297; glucose 149; bun 24;creat 0.99; k+ 3.3; na++ 139; ca 9.6; liver normal albumin 3.7; ammonia 18; blood culture: no growth 04-27-17: urine culture: e-coli 04-28-17: wbc 12.9; hgb 8.7; hct 26.5; mcv 86.9; plt 206; glucose 110; bun 7; creat 0.61; k+ 3.5; na++ 144; ca 8.6; liver normal albumin 2.7 04-30-17: glucose 110; bun <5; creat 0.63; k+ 3.0; na++ 144; ca 8.6; mag 1.8; vit B 12: 1282; folate 33.0; iron 43; tibc 235. 05-02-17: wbc 12.4; hgb 11.9; hct 35.1; mcv 86.1 plt 453; glucose 120; bun 7.5; creat 0.57; k+ 4.2; na++ 141; ca 9.7; ast 63; alt 62; albumin 4.3;  05-12-17: FOBT: neg 05-17-17: glucose 140; bun 13.5; creat 0.56; k+ 3.8; na++ 137; ca 9.9; liver normal albumin 4.1  06-05-17: FOBT positive; c-diff: neg 06-08-17: wbc 11.0; hgb 10.7; hct 31.8; mcv 83.0; plt 381 07-11-17: wbc 11.6; hgb 11.1; hct 32.5 ;mcv 79.6; plt 397; glucose 90; bun 10.7; creat 0.62; k+ 3.7; na++ 141; ca 9.4; alk phos 134; albumin 4.2; urine culture: 65,000 multiple bacteria    Lymph# 4.2   TODAY:  07-16-17: urine micro-albumin 64.5;  micro/creat ratio: 367.8    Review of Systems  Unable to perform ROS: Dementia (confused )    Physical Exam  Constitutional: No distress.  Frail   Neck: No thyromegaly present.  Cardiovascular: Normal rate, regular rhythm and intact distal pulses.  Murmur heard. 1/6  Pulmonary/Chest: Effort normal and breath sounds normal.  Abdominal: Soft. Bowel sounds are normal. She exhibits no distension. There is no tenderness.  Genitourinary:  Genitourinary Comments: Foley   Musculoskeletal: She exhibits edema.  2+ lower extremity edema Able to move all extremities  Leans to left when in wheelchair   Lymphadenopathy:    She has no cervical adenopathy.  Neurological: She is alert.  Skin: Skin is warm and dry. She is not diaphoretic.  Unstaged coccyx: 1.3 x 2.2 x 0.1 cm   Psychiatric: She has a normal mood and affect.    ASSESSMENT/ PLAN:  TODAY:   1.  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 Will check bmp in 2 weeks.    MD is aware of resident's narcotic use and is in agreement with current plan of care. We will attempt to wean resident as apropriate   Ok Edwards NP [facility]  Contact [phone] Monday through Friday 8am- 5pm  After hours call [phone]

## 2017-07-30 ENCOUNTER — Encounter: Payer: Self-pay | Admitting: Adult Health

## 2017-07-30 ENCOUNTER — Non-Acute Institutional Stay (SKILLED_NURSING_FACILITY): Payer: Medicaid Other | Admitting: Adult Health

## 2017-07-30 DIAGNOSIS — Z960 Presence of urogenital implants: Secondary | ICD-10-CM

## 2017-07-30 DIAGNOSIS — I825Y2 Chronic embolism and thrombosis of unspecified deep veins of left proximal lower extremity: Secondary | ICD-10-CM | POA: Diagnosis not present

## 2017-07-30 DIAGNOSIS — K5909 Other constipation: Secondary | ICD-10-CM

## 2017-07-30 DIAGNOSIS — R600 Localized edema: Secondary | ICD-10-CM | POA: Diagnosis not present

## 2017-07-30 DIAGNOSIS — R339 Retention of urine, unspecified: Secondary | ICD-10-CM

## 2017-07-30 DIAGNOSIS — Z978 Presence of other specified devices: Secondary | ICD-10-CM | POA: Insufficient documentation

## 2017-07-30 DIAGNOSIS — Z9289 Personal history of other medical treatment: Secondary | ICD-10-CM

## 2017-07-30 NOTE — Progress Notes (Signed)
Location:   [facility] Room Number: 204 B Place of Service:  SNF (31)   CODE STATUS: Full Code  No Known Allergies  Chief Complaint  Patient presents with  . Medical Management of Chronic Issues    Dvt; constipation; urine retention; edema    HPI:  She is a 62 year old long term resident of this facility being seen for the management of her chronic illnesses: dvt; constipation; urine retention; edema. She is unable to fully participate in the hpi or ros. She has been seen by urology and will require her foley long term. There are no reports of changes in appetite; her behavior is stable after her medication adjustments. There are no nursing concerns at this time.   Past Medical History:  Diagnosis Date  . Acute deep vein thrombosis (DVT) of left femoral vein ([place]) 02/22/2015  . Alcohol abuse   . Arthritis   . Chronic constipation 08/26/2015  . Dementia with behavioral problem   . Diabetes mellitus without complication ([place])   . Dysphagia    and aspiration risk  . 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 Seizures ([place])     History reviewed. No pertinent surgical history.  Social History   Socioeconomic History  . Marital status: Unknown    Spouse name: Not on file  . Number of children: Not on file  . Years of education: Not on file  . Highest education level: Not on file  Social Needs  . Financial resource strain: Not on file  . Food insecurity - worry: Not on file  . Food insecurity - inability: Not on file  . Transportation needs - medical: Not on file  . Transportation needs - non-medical: Not on file  Occupational History  . Not on file  Tobacco Use  . Smoking status: Former Smoker    Packs/day: 0.50    Years: 15.00    Pack years: 7.50    Types: Cigarettes  . Smokeless tobacco: Never Used  Substance and Sexual Activity  . Alcohol use: Yes    Alcohol/week: 1.2 oz    Types: 2 Cans of beer per week    Comment: occ  . Drug use: No  . Sexual  activity: Not Currently  Other Topics Concern  . Not on file  Social History Narrative  . Not on file   History reviewed. No pertinent family history.    VITAL SIGNS BP 132/68   Ht _0  (1.6 m)   Wt 147 lb 3.2 oz (66.8 kg)   LMP 03/25/2015 (Approximate)   BMI 26.08 kg/m   Outpatient Encounter Medications as of 07/30/2017  Medication Sig  . Amino Acids-Protein Hydrolys (FEEDING SUPPLEMENT, PRO-STAT SUGAR FREE 64,) LIQD Take 30 mLs by mouth 2 (two) times daily.  Marland Kitchen atorvastatin (LIPITOR) 10 MG tablet Take 10 mg by mouth daily.  . benztropine (COGENTIN) 0.5 MG tablet Take 0.5 mg by mouth at bedtime.  . cholecalciferol (VITAMIN D) 1000 UNITS tablet Take 1,000 Units by mouth daily. Reported on 10/28/2015  . Cranberry 400 MG TABS Take 1 tablet by mouth 2 (two) times daily.  . diclofenac sodium (VOLTAREN) 1 % GEL Apply 2 g topically 4 (four) times daily. Apply to both knees topically four times a day for pain.   Marland Kitchen ENSURE (ENSURE) Give 90cc by mouth three times a day in between meals for weight support  . gabapentin (NEURONTIN) 300 MG capsule Take 300 mg by mouth 3 (three) times daily.  Marland Kitchen  linaclotide (LINZESS) 72 MCG capsule Take 72 mcg by mouth daily before breakfast.  . linagliptin (TRADJENTA) 5 MG TABS tablet Take 5 mg by mouth daily.  Marland Kitchen lisinopril (PRINIVIL,ZESTRIL) 2.5 MG tablet Take 2.5 mg by mouth daily. Hold for SBP < 110  . LORazepam (ATIVAN) 1 MG tablet Take 1 mg by mouth 2 (two) times daily.  . Melatonin 3 MG TABS Take 3 mg by mouth at bedtime.   . metFORMIN (GLUCOPHAGE) 500 MG tablet Take 500 mg by mouth daily.  . Multiple Vitamins-Minerals (DECUBI-VITE) CAPS Take 2 capsules by mouth at bedtime.  . Nutritional Supplements (NUTRITIONAL SUPPLEMENT PO) Take by mouth. HSG NAS Diet -  Mech soft texture, regular consistency  . OLANZapine (ZYPREXA) 10 MG tablet Take 10 mg by mouth at bedtime.  . polyethylene glycol (MIRALAX / GLYCOLAX) packet Take 17 g by mouth 2 (two) times daily.  .  potassium chloride SA (K-DUR,KLOR-CON) 20 MEQ tablet Take 20 mEq by mouth 2 (two) times daily.   . rivaroxaban (XARELTO) 20 MG TABS tablet Take 20 mg by mouth at bedtime.  . senna-docusate (SENOKOT-S) 8.6-50 MG tablet Take 1 tablet by mouth 2 (two) times daily.  Marland Kitchen thiamine 100 MG tablet Take 100 mg by mouth daily.  Marland Kitchen torsemide (DEMADEX) 20 MG tablet Take 20 mg by mouth daily.   . traMADol (ULTRAM) 50 MG tablet Take 1 tablet (50 mg total) by mouth every 6 (six) hours.  . [DISCONTINUED] Nutritional Supplements (NUTRITIONAL SUPPLEMENT PO) House Supplement - Give house supplement with meals daily for caloric and weight support   No facility-administered encounter medications on file as of 07/30/2017.      SIGNIFICANT DIAGNOSTIC EXAMS  PREVIOUS  10-02-14: left leg doppler: +dvt  08-08-16: left lower extremity doppler: + DVT  04-26-17: chest x-ray: Possible right upper lobe opacity which could represent infection or be secondary to osseous summation shadow and vascular crowding. If possible, consider PA radiograph. If not possible, consider repeat frontal AP radiograph without obliquity. Aortic Atherosclerosis  04-26-17: ct of abdomen and pelvis: 1. Large amount of retained large bowel stool, stool distended rectum associated with fecal impaction. 2. Sequelae of chronic pancreatitis with multiple small pancreatic cysts most compatible with pseudocyst. 3. Cholelithiasis and extra hepatic biliary dilatation without choledocholithiasis or CT findings of acute cholecystitis. 4. Distended urinary bladder seen with neurogenic bladder/bladder outlet obstruction. Recommend correlation with voiding. 5. 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 Aortic Atherosclerosis  04-26-17: ct of head: No CT evidence for acute intracranial abnormality.   04-28-17: 2-d echo:   - Left ventricle: The cavity size was mildly dilated. Wall thickness was normal. Systolic function was  severely reduced. The estimated ejection fraction was in the range of 20% to 25%. Severe diffuse hypokinesis with regional variations. Possible disproportionately severe hypokinesis of the inferolateral, inferior, and inferoseptal myocardium. Features are consistent with a pseudonormal left ventricular filling pattern, with concomitant abnormal relaxation and increased filling pressure (grade 2 diastolic dysfunction).  04-29-17: ct of chest: 1. There is a 6 mm nodule in the right lung base. Non-contrast chest CT at 6-12 months is recommended. If the nodule is stable at time of repeat CT, then future CT at 18-24 months (from today's scan) is considered optional for low-risk patients, but is recommended for high-risk patients. :  2. No CT correlate for the recent chest x-ray finding in the right mid lung. 3. No other abnormalities.  NO NEW EXAMS    LABS REVIEWED: PREVIOUS  07-27-16: hgb a1c 7.1; chol 153; ldl 66; trig 164; hdl 55  09-22-16: wbc 12.1; hgb 11.1; hct 34.8; mcv 86.6; plt 282; glucose 106; bun 14.0; creat 0.55; k+ 3.8; na++ 140; ca 10.1; liver normal albumin 4.4  01-04-17: wbc 10.7; hgb 12.3; hct 37.3; mcv 83.6; plt 227; glucose 120; bun 13.6; creat 0.58; k+ 3.7; na++ 140; ca 9.4  01-11-17: BNP 339.10 02-16-17: tsh 1.39; vit B 12: 0539; folic 76.7  34-19-37: hgb a1c 7.8  04-20-17: wbc 12.4; hgb 11.9; hct 35.9; mcv 85.3; plt 268; glucose 136; bun 16.5; creat 0.60 ;k+ 3.3; na++ 138; liver normal albumin 4.1  04-26-17: wbc 18.5; hgb 11.9; hct 35.9; mcv 86.;3 plt 297; glucose 149; bun 24;creat 0.99; k+ 3.3; na++ 139; ca 9.6; liver normal albumin 3.7; ammonia 18; blood culture: no growth 04-27-17: urine culture: e-coli 04-28-17: wbc 12.9; hgb 8.7; hct 26.5; mcv 86.9; plt 206; glucose 110; bun 7; creat 0.61; k+ 3.5; na++ 144; ca 8.6; liver normal albumin 2.7 04-30-17: glucose 110; bun <5; creat 0.63; k+ 3.0; na++ 144; ca 8.6; mag 1.8; vit B 12: 1282; folate 33.0; iron 43; tibc 235. 05-02-17: wbc  12.4; hgb 11.9; hct 35.1; mcv 86.1 plt 453; glucose 120; bun 7.5; creat 0.57; k+ 4.2; na++ 141; ca 9.7; ast 63; alt 62; albumin 4.3;  05-12-17: FOBT: neg 05-17-17: glucose 140; bun 13.5; creat 0.56; k+ 3.8; na++ 137; ca 9.9; liver normal albumin 4.1  06-05-17: FOBT positive; c-diff: neg 06-08-17: wbc 11.0; hgb 10.7; hct 31.8; mcv 83.0; plt 381 07-11-17: wbc 11.6; hgb 11.1; hct 32.5 ;mcv 79.6; plt 397; glucose 90; bun 10.7; creat 0.62; k+ 3.7; na++ 141; ca 9.4; alk phos 134; albumin 4.2; urine culture: 65,000 multiple bacteria    Lymph# 4.2  07-16-17: urine micro-albumin 64.5; micro/creat ratio: 367.8   NO NEW LABS.    Review of Systems  Unable to perform ROS: Dementia (confused )   Physical Exam  Constitutional: No distress.  Frail   Neck: No thyromegaly present.  Cardiovascular: Normal rate, regular rhythm and intact distal pulses.  Murmur heard. 1/6  Pulmonary/Chest: Effort normal and breath sounds normal. No respiratory distress.  Abdominal: Soft. Bowel sounds are normal. She exhibits no distension. There is no tenderness.  Genitourinary:  Genitourinary Comments: Foley   Musculoskeletal: She exhibits edema.  2+ lower extremity edema Able to move all extremities  Leans to left when in wheelchair    Lymphadenopathy:    She has no cervical adenopathy.  Neurological: She is alert.  Skin: Skin is warm and dry. She is not diaphoretic.  Unstaged: coccyx: 1.4 x 0.5 x 0.2 cm She does spend a great deal of her day out of bed   Psychiatric: She has a normal mood and affect.    ASSESSMENT/ PLAN:  TODAY   1. Upper leg DVT chronic left:  (08-08-16; #2) stable  will continue xarelto 20 mg nightly she is on long term therapy  will monitor this at this time.      2. Bilateral lower extremity edema: no change in status:  will continue demadex  20 mg daily with k+ 20 meq twice daily   3. Chronic constipation: stable  will continue senna twice daily; miralax twice daily and  linzess 72 mcg  daily   4. Urine retention: has chronic indwelling foley catheter: stable  will have has been seen by urology; will need foley long term    PREVIOUS   5. Right lung nodule: 6 mm will  repeat ct scan of chest in June 2019  6. Chronic pancreatitis:  Sequelae of chronic pancreatitis with multiple small pancreatic cysts most compatible with pseudocyst. Will not make changes  7. Neuroleptic-induced tardive dyskinesia: is stable will continue cogentin 0.5 mg nightly   8. Hypertension associated with diabetes type 2 : is stable b/p 132/68 will continue lisinopril 2.5 mg daily   9. Dyslipidemia associated with diabetes type 2: stable will continue lipitor 10 mg daily  11. Unstaged coccyx ulceration: will continue nutritional support and will continue treatment plan for her wound is seen by wound dr.   Claude Manges. Diabetes mellitus with neurological manifestations: without change  hgb a1c is 7.8 (previous 7.1)  Urine for micro-albumin is 7.4;   Will continue metformin xr  500 mg  daily;   tradjenta 5 mg daily and will monitor  is on statin and ace   13 .Chronic pain with osteoarthritis of both knees: she is  getting adequate pain relief: will continue voltaren gel 2 gm to both knees four times daily; will continue neurontin  300 mg three times daily; and will continue ultram  50 mg every 6 hours routinely    14. Psychosis; due to alcohol: she is presently without change in status; will continue zyprexa 10 mg daily; thiamine and folic acid daily. Is on ativan 1 mg twice daily for anxiety    15. 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 Her current weight is 147 pounds her weight in July 2018: 154 pounds with this disease process weight loss is an expected outcome.   MD is aware of resident's narcotic use and is in agreement with current plan of care. We will attempt to wean  resident as apropriate   Ok Edwards NP [facility]  Contact [phone] Monday through Friday 8am- 5pm  After hours call [phone] '

## 2017-08-02 LAB — BASIC METABOLIC PANEL
BUN: 32 — AB (ref 4–21)
Creatinine: 1.3 — AB (ref 0.5–1.1)
GLUCOSE: 114
Potassium: 4.1 (ref 3.4–5.3)
Sodium: 139 (ref 137–147)

## 2017-08-08 ENCOUNTER — Other Ambulatory Visit: Payer: Self-pay

## 2017-08-08 MED ORDER — TRAMADOL HCL 50 MG PO TABS: 50.0000 mg | ORAL_TABLET | Freq: Four times a day (QID) | ORAL | 0 refills | Status: DC

## 2017-08-08 NOTE — Telephone Encounter (Signed)
RX faxed to [facility] @ [phone], phone number [phone] 

## 2017-08-12 ENCOUNTER — Emergency Department (HOSPITAL_COMMUNITY): Payer: Medicaid Other

## 2017-08-12 ENCOUNTER — Inpatient Hospital Stay (HOSPITAL_COMMUNITY)
Admission: EM | Admit: 2017-08-12 | Discharge: 2017-08-19 | DRG: 698 | Disposition: A | Discharge status: Skilled Nursing Facility | Payer: Medicaid Other | Attending: Family Medicine | Admitting: Family Medicine

## 2017-08-12 DIAGNOSIS — Z515 Encounter for palliative care: Secondary | ICD-10-CM

## 2017-08-12 DIAGNOSIS — N390 Urinary tract infection, site not specified: Secondary | ICD-10-CM

## 2017-08-12 DIAGNOSIS — Z7901 Long term (current) use of anticoagulants: Secondary | ICD-10-CM

## 2017-08-12 DIAGNOSIS — Z79899 Other long term (current) drug therapy: Secondary | ICD-10-CM

## 2017-08-12 DIAGNOSIS — G2401 Drug induced subacute dyskinesia: Secondary | ICD-10-CM | POA: Diagnosis present

## 2017-08-12 DIAGNOSIS — K5909 Other constipation: Secondary | ICD-10-CM | POA: Diagnosis present

## 2017-08-12 DIAGNOSIS — I82502 Chronic embolism and thrombosis of unspecified deep veins of left lower extremity: Secondary | ICD-10-CM | POA: Diagnosis present

## 2017-08-12 DIAGNOSIS — A4151 Sepsis due to Escherichia coli [E. coli]: Secondary | ICD-10-CM | POA: Diagnosis present

## 2017-08-12 DIAGNOSIS — Z79891 Long term (current) use of opiate analgesic: Secondary | ICD-10-CM

## 2017-08-12 DIAGNOSIS — Z87891 Personal history of nicotine dependence: Secondary | ICD-10-CM

## 2017-08-12 DIAGNOSIS — Z6822 Body mass index (BMI) 22.0-22.9, adult: Secondary | ICD-10-CM

## 2017-08-12 DIAGNOSIS — T83518A Infection and inflammatory reaction due to other urinary catheter, initial encounter: Principal | ICD-10-CM | POA: Diagnosis present

## 2017-08-12 DIAGNOSIS — R6521 Severe sepsis with septic shock: Secondary | ICD-10-CM | POA: Diagnosis present

## 2017-08-12 DIAGNOSIS — F10959 Alcohol use, unspecified with alcohol-induced psychotic disorder, unspecified: Secondary | ICD-10-CM | POA: Diagnosis present

## 2017-08-12 DIAGNOSIS — N179 Acute kidney failure, unspecified: Secondary | ICD-10-CM

## 2017-08-12 DIAGNOSIS — Y846 Urinary catheterization as the cause of abnormal reaction of the patient, or of later complication, without mention of misadventure at the time of the procedure: Secondary | ICD-10-CM | POA: Diagnosis present

## 2017-08-12 DIAGNOSIS — Z89422 Acquired absence of other left toe(s): Secondary | ICD-10-CM

## 2017-08-12 DIAGNOSIS — Z7984 Long term (current) use of oral hypoglycemic drugs: Secondary | ICD-10-CM

## 2017-08-12 DIAGNOSIS — E876 Hypokalemia: Secondary | ICD-10-CM | POA: Diagnosis present

## 2017-08-12 DIAGNOSIS — I110 Hypertensive heart disease with heart failure: Secondary | ICD-10-CM | POA: Diagnosis present

## 2017-08-12 DIAGNOSIS — L89152 Pressure ulcer of sacral region, stage 2: Secondary | ICD-10-CM | POA: Diagnosis present

## 2017-08-12 DIAGNOSIS — R627 Adult failure to thrive: Secondary | ICD-10-CM | POA: Diagnosis present

## 2017-08-12 DIAGNOSIS — I5042 Chronic combined systolic (congestive) and diastolic (congestive) heart failure: Secondary | ICD-10-CM | POA: Diagnosis present

## 2017-08-12 DIAGNOSIS — R911 Solitary pulmonary nodule: Secondary | ICD-10-CM | POA: Diagnosis present

## 2017-08-12 DIAGNOSIS — G9341 Metabolic encephalopathy: Secondary | ICD-10-CM | POA: Diagnosis present

## 2017-08-12 DIAGNOSIS — E1142 Type 2 diabetes mellitus with diabetic polyneuropathy: Secondary | ICD-10-CM | POA: Diagnosis present

## 2017-08-12 DIAGNOSIS — F0391 Unspecified dementia with behavioral disturbance: Secondary | ICD-10-CM | POA: Diagnosis present

## 2017-08-12 DIAGNOSIS — T83511A Infection and inflammatory reaction due to indwelling urethral catheter, initial encounter: Secondary | ICD-10-CM

## 2017-08-12 DIAGNOSIS — L8940 Pressure ulcer of contiguous site of back, buttock and hip, unspecified stage: Secondary | ICD-10-CM

## 2017-08-12 DIAGNOSIS — G8929 Other chronic pain: Secondary | ICD-10-CM | POA: Diagnosis present

## 2017-08-12 DIAGNOSIS — A419 Sepsis, unspecified organism: Secondary | ICD-10-CM

## 2017-08-12 LAB — URINALYSIS, ROUTINE W REFLEX MICROSCOPIC
GLUCOSE, UA: NEGATIVE mg/dL
Ketones, ur: NEGATIVE mg/dL
NITRITE: NEGATIVE
PH: 5 (ref 5.0–8.0)
Protein, ur: >=300 mg/dL — AB
Specific Gravity, Urine: 1.018 (ref 1.005–1.030)
Squamous Epithelial / LPF: NONE SEEN

## 2017-08-12 LAB — I-STAT CG4 LACTIC ACID, ED: LACTIC ACID, VENOUS: 1.48 mmol/L (ref 0.5–1.9)

## 2017-08-12 NOTE — ED Provider Notes (Addendum)
[facility] EMERGENCY DEPARTMENT Provider Note   CSN: [id number] Arrival date & time: 08/12/17  2241     History   Chief Complaint Chief Complaint  Patient presents with  . Altered Mental Status    HPI Sarah Mckay is a 62 y.o. female.  Patient is a 62 year old female with past medical history of dementia, psychosis due to alcohol abuse, seizures, and diabetes.  She was brought from her extended care facility for evaluation of increased confusion and somnolence over the past 2 days.  According to the nursing home staff and EMS personnel, she is not "quite herself".  The patient adds no additional history secondary to her underlying dementia and medical issues.   The history is provided by the patient and the EMS personnel.  Altered Mental Status   This is a new problem. The problem has been gradually worsening. Associated symptoms include confusion, somnolence and weakness.    Past Medical History:  Diagnosis Date  . Acute deep vein thrombosis (DVT) of left femoral vein ([facility]) 02/22/2015  . Alcohol abuse   . Arthritis   . Chronic constipation 08/26/2015  . Dementia with behavioral problem   . Diabetes mellitus without complication ([facility])   . Dysphagia    and aspiration risk  . 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 Seizures [facility])     Patient Active Problem List   Diagnosis Date Noted  . Chronic indwelling Foley catheter 07/30/2017  . Type II diabetes mellitus with renal manifestations ([facility]) 07/19/2017  . Pulmonary nodule 06/09/2017  . Neuroleptic-induced tardive dyskinesia 05/27/2017  . Urine retention 05/16/2017  . Pressure injury of skin 04/29/2017  . Vascular dementia without behavioral disturbance   . CHF (congestive heart failure) ([facility]) 04/27/2017  . Acute metabolic encephalopathy 04/26/2017  . Aspiration pneumonia ([facility]) 04/26/2017  . Abdominal distension 04/26/2017  . Weight loss, abnormal 02/25/2017  . Upper leg DVT (deep venous  thromboembolism), chronic, left ([facility]) 10/19/2016  . 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 Bilateral lower extremity edema 12/21/2015  . Sepsis ([facility]) 09/16/2015  . Anxiety 09/16/2015  . Chronic constipation 08/26/2015  . Anemia 08/26/2015  . UTI (urinary tract infection) 08/26/2015  . Failure to thrive in adult 07/05/2015  . Polyneuropathy due to type 2 diabetes mellitus ([facility]) 03/25/2015  . Diabetes mellitus with neurological manifestations ([facility]) 10/22/2014  . Primary osteoarthritis of both knees 06/03/2014  . Chronic pain 05/13/2014  . Alcohol abuse   . 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  OB History    No data available       Home Medications    Prior to Admission medications   Medication Sig Start Date End Date Taking? Authorizing Provider  Amino Acids-Protein Hydrolys (FEEDING SUPPLEMENT, PRO-STAT SUGAR FREE 64,) LIQD Take 30 mLs by mouth 2 (two) times daily.    [provider]  atorvastatin (LIPITOR) 10 MG tablet Take 10 mg by mouth daily. 07/14/17   [provider]  benztropine (COGENTIN) 0.5 MG tablet Take 0.5 mg by mouth at bedtime.    [provider]  cholecalciferol (VITAMIN D) 1000 UNITS tablet Take 1,000 Units by mouth daily. Reported on 10/28/2015    [provider]  Cranberry 400 MG TABS Take 1 tablet by mouth 2 (two) times daily. 07/26/17   [provider]  diclofenac sodium (VOLTAREN) 1 % GEL Apply 2 g topically 4 (four) times daily. Apply to both knees topically four  times a day for pain.     [provider]  ENSURE (ENSURE) Give 90cc by mouth three times a day in between meals for weight support    [provider]  gabapentin (NEURONTIN) 300 MG capsule Take 300 mg by mouth 3 (three) times daily.    [provider]  linaclotide (LINZESS) 72 MCG capsule Take 72 mcg by mouth daily before breakfast.    [provider]  linagliptin (TRADJENTA) 5 MG TABS  tablet Take 5 mg by mouth daily.    [provider]  lisinopril (PRINIVIL,ZESTRIL) 2.5 MG tablet Take 2.5 mg by mouth daily. Hold for SBP < 110    [provider]  LORazepam (ATIVAN) 1 MG tablet Take 1 mg by mouth 2 (two) times daily.    [provider]  Melatonin 3 MG TABS Take 3 mg by mouth at bedtime.     [provider]  metFORMIN (GLUCOPHAGE) 500 MG tablet Take 500 mg by mouth daily. 06/01/17   [provider]  Multiple Vitamins-Minerals (DECUBI-VITE) CAPS Take 2 capsules by mouth at bedtime.    [provider]  Nutritional Supplements (NUTRITIONAL SUPPLEMENT PO) Take by mouth. HSG NAS Diet -  Mech soft texture, regular consistency    [provider]  OLANZapine (ZYPREXA) 10 MG tablet Take 10 mg by mouth at bedtime.    [provider]  polyethylene glycol (MIRALAX / GLYCOLAX) packet Take 17 g by mouth 2 (two) times daily.    [provider]  potassium chloride SA (K-DUR,KLOR-CON) 20 MEQ tablet Take 20 mEq by mouth 2 (two) times daily.     [provider]  rivaroxaban (XARELTO) 20 MG TABS tablet Take 20 mg by mouth at bedtime.    [provider]  senna-docusate (SENOKOT-S) 8.6-50 MG tablet Take 1 tablet by mouth 2 (two) times daily. 04/30/17   Albertine Grates, MD  thiamine 100 MG tablet Take 100 mg by mouth daily.    [provider]  torsemide (DEMADEX) 20 MG tablet Take 20 mg by mouth daily.  05/10/17   [provider]  traMADol (ULTRAM) 50 MG tablet Take 1 tablet (50 mg total) by mouth every 6 (six) hours. 08/08/17   Sharee Holster, NP    Family History No family history on file.  Social History Social History   Tobacco Use  . Smoking status: Former Smoker    Packs/day: 0.50    Years: 15.00    Pack years: 7.50    Types: Cigarettes  . Smokeless tobacco: Never Used  Substance Use Topics  . Alcohol use: Yes    Alcohol/week: 1.2 oz    Types: 2 Cans of beer per week     Comment: occ  . Drug use: No     Allergies   Patient has no known allergies.   Review of Systems Review of Systems  Neurological: Positive for weakness.  Psychiatric/Behavioral: Positive for confusion.  All other systems reviewed and are negative.    Physical Exam Updated Vital Signs BP (!) 104/43   Resp (!) 29   LMP 03/25/2015 (Approximate)   Physical Exam  Constitutional:  Patient is a 62 year old female who appears older than stated age.  She appears chronically ill.  She is somnolent, but arousable.  Speech is incomprehensible.  HENT:  Head: Normocephalic and atraumatic.  Eyes: Pupils are equal, round, and reactive to light.  Neck: Normal range of motion. Neck supple.  Cardiovascular: Normal rate and regular rhythm. Exam  reveals no gallop and no friction rub.  No murmur heard. Pulmonary/Chest: Effort normal and breath sounds normal. No respiratory distress. She has no wheezes.  Abdominal: Soft. Bowel sounds are normal. She exhibits no distension. There is no tenderness.  Musculoskeletal: Normal range of motion. She exhibits no edema.  Neurological:  Neurologic exam is incomplete secondary to underlying medical condition.  She is awake and muttering incomprehensible sounds.  She does not follow commands.  Skin: Skin is warm and dry.  Nursing note and vitals reviewed.    ED Treatments / Results  Labs (all labs ordered are listed, but only abnormal results are displayed) Labs Reviewed  CULTURE, BLOOD (ROUTINE X 2)  CULTURE, BLOOD (ROUTINE X 2)  URINE CULTURE  COMPREHENSIVE METABOLIC PANEL  CBC WITH DIFFERENTIAL/PLATELET  PROTIME-INR  URINALYSIS, ROUTINE W REFLEX MICROSCOPIC  I-STAT CG4 LACTIC ACID, ED    EKG  EKG Interpretation  Date/Time:  Monday August 13 2017 05:37:25 EDT Ventricular Rate:  98 PR Interval:    QRS Duration: 100 QT Interval:  378 QTC Calculation: 483 R Axis:   3 Text Interpretation:  Sinus rhythm Low voltage, precordial leads  Borderline T abnormalities, anterior leads Confirmed by Geoffery Lyons ([id number]) on 08/13/2017 5:42:38 AM       Radiology No results found.  Procedures Procedures (including critical care time)  Medications Ordered in ED Medications - No data to display   Initial Impression / Assessment and Plan / ED Course  I have reviewed the triage vital signs and the nursing notes.  Pertinent labs & imaging results that were available during my care of the patient were reviewed by me and considered in my medical decision making (see chart for details).  Patient is a 62 year old female with history of dementia brought from her extended care facility for evaluation of decreased responsiveness and altered mental status.  She was initially with blood pressure in the 100s upon arrival.  Shortly after laboratory studies were obtained, she became hypotensive.  She then returned with a white count of 19,000 and metabolic panel showing acute renal failure.  The nurse then made me aware that her blood pressure was in the 60s systolic.  At this point I initiated a code sepsis and administered appropriate fluid bolus and antibiotics.  She has an indwelling Foley catheter and what appears to be significantly contaminated urine.  She also has a foul-smelling, bothersome appearing decubitus ulcer.  I suspect 1 of these to be the source.  After fluids, she remained significantly hypotensive and a Levophed drip was initiated.  Her blood pressure did improve somewhat and critical care was consulted for admission.  I spoke with the patient's brother, Sarah Mckay who was informed me that the patient is a full code and does request resuscitation/intubation.  After evaluation by critical care, she will be admitted to their service.  CRITICAL CARE Performed by: Geoffery Lyons Total critical care time: 45 minutes Critical care time was exclusive of separately billable procedures and treating other patients. Critical  care was necessary to treat or prevent imminent or life-threatening deterioration. Critical care was time spent personally by me on the following activities: development of treatment plan with patient and/or surrogate as well as nursing, discussions with consultants, evaluation of patient's response to treatment, examination of patient, obtaining history from patient or surrogate, ordering and performing treatments and interventions, ordering and review of laboratory studies, ordering and review of radiographic studies, pulse oximetry and re-evaluation of patient's condition.   Final Clinical Impressions(s) /  ED Diagnoses   Final diagnoses:  None    ED Discharge Orders    None       Geoffery Lyonselo, Jancarlos Thrun, MD 08/13/17 [id number]    Geoffery Lyonselo, Dustin Burrill, MD 08/13/17 [phone]

## 2017-08-12 NOTE — ED Triage Notes (Signed)
Pt arrived via [facility] from [facility] after staff stated pt had been unusually altered for "2 days". Per the paramedic, pt is not as energetic as she has has been in the past with previous transports of this pt. Pt is nonverbal but alert and does not follow commands. Pt has hx of dementia, unspecified psych, and DM. EMS cbg 290, hr 110, #1 BP 85/58, #2 bp 110/76 (after NS bolus). Pt was soiled upon arrival to ED and had foley catheter in place with no output noted to bag but urine seen in tube. Skin ulcer noted to sacral area that has been bandaged however bandage was badly soiled/saturated.

## 2017-08-13 ENCOUNTER — Emergency Department (HOSPITAL_COMMUNITY): Payer: Medicaid Other

## 2017-08-13 DIAGNOSIS — R4182 Altered mental status, unspecified: Secondary | ICD-10-CM | POA: Insufficient documentation

## 2017-08-13 DIAGNOSIS — Z9289 Personal history of other medical treatment: Secondary | ICD-10-CM | POA: Diagnosis not present

## 2017-08-13 DIAGNOSIS — I110 Hypertensive heart disease with heart failure: Secondary | ICD-10-CM | POA: Diagnosis present

## 2017-08-13 DIAGNOSIS — A419 Sepsis, unspecified organism: Secondary | ICD-10-CM | POA: Diagnosis not present

## 2017-08-13 DIAGNOSIS — N179 Acute kidney failure, unspecified: Secondary | ICD-10-CM

## 2017-08-13 DIAGNOSIS — I82502 Chronic embolism and thrombosis of unspecified deep veins of left lower extremity: Secondary | ICD-10-CM | POA: Diagnosis present

## 2017-08-13 DIAGNOSIS — E1142 Type 2 diabetes mellitus with diabetic polyneuropathy: Secondary | ICD-10-CM | POA: Diagnosis present

## 2017-08-13 DIAGNOSIS — Z79891 Long term (current) use of opiate analgesic: Secondary | ICD-10-CM | POA: Diagnosis not present

## 2017-08-13 DIAGNOSIS — Z7901 Long term (current) use of anticoagulants: Secondary | ICD-10-CM | POA: Diagnosis not present

## 2017-08-13 DIAGNOSIS — G8929 Other chronic pain: Secondary | ICD-10-CM | POA: Diagnosis present

## 2017-08-13 DIAGNOSIS — I824Y9 Acute embolism and thrombosis of unspecified deep veins of unspecified proximal lower extremity: Secondary | ICD-10-CM | POA: Diagnosis not present

## 2017-08-13 DIAGNOSIS — G2401 Drug induced subacute dyskinesia: Secondary | ICD-10-CM | POA: Diagnosis present

## 2017-08-13 DIAGNOSIS — R911 Solitary pulmonary nodule: Secondary | ICD-10-CM | POA: Diagnosis present

## 2017-08-13 DIAGNOSIS — T83511A Infection and inflammatory reaction due to indwelling urethral catheter, initial encounter: Secondary | ICD-10-CM | POA: Diagnosis not present

## 2017-08-13 DIAGNOSIS — A4151 Sepsis due to Escherichia coli [E. coli]: Secondary | ICD-10-CM | POA: Diagnosis present

## 2017-08-13 DIAGNOSIS — Z7984 Long term (current) use of oral hypoglycemic drugs: Secondary | ICD-10-CM | POA: Diagnosis not present

## 2017-08-13 DIAGNOSIS — N390 Urinary tract infection, site not specified: Secondary | ICD-10-CM | POA: Diagnosis present

## 2017-08-13 DIAGNOSIS — T83518A Infection and inflammatory reaction due to other urinary catheter, initial encounter: Secondary | ICD-10-CM | POA: Diagnosis present

## 2017-08-13 DIAGNOSIS — F10959 Alcohol use, unspecified with alcohol-induced psychotic disorder, unspecified: Secondary | ICD-10-CM | POA: Diagnosis present

## 2017-08-13 DIAGNOSIS — L89152 Pressure ulcer of sacral region, stage 2: Secondary | ICD-10-CM | POA: Diagnosis present

## 2017-08-13 DIAGNOSIS — I5042 Chronic combined systolic (congestive) and diastolic (congestive) heart failure: Secondary | ICD-10-CM | POA: Diagnosis present

## 2017-08-13 DIAGNOSIS — F0391 Unspecified dementia with behavioral disturbance: Secondary | ICD-10-CM | POA: Diagnosis present

## 2017-08-13 DIAGNOSIS — E870 Hyperosmolality and hypernatremia: Secondary | ICD-10-CM | POA: Diagnosis not present

## 2017-08-13 DIAGNOSIS — L8940 Pressure ulcer of contiguous site of back, buttock and hip, unspecified stage: Secondary | ICD-10-CM | POA: Diagnosis not present

## 2017-08-13 DIAGNOSIS — R627 Adult failure to thrive: Secondary | ICD-10-CM | POA: Diagnosis present

## 2017-08-13 DIAGNOSIS — E876 Hypokalemia: Secondary | ICD-10-CM | POA: Diagnosis present

## 2017-08-13 DIAGNOSIS — R6521 Severe sepsis with septic shock: Secondary | ICD-10-CM | POA: Diagnosis present

## 2017-08-13 DIAGNOSIS — F1027 Alcohol dependence with alcohol-induced persisting dementia: Secondary | ICD-10-CM | POA: Diagnosis not present

## 2017-08-13 DIAGNOSIS — Z6822 Body mass index (BMI) 22.0-22.9, adult: Secondary | ICD-10-CM | POA: Diagnosis not present

## 2017-08-13 DIAGNOSIS — R000 Tachycardia, unspecified: Secondary | ICD-10-CM | POA: Diagnosis not present

## 2017-08-13 DIAGNOSIS — K5909 Other constipation: Secondary | ICD-10-CM | POA: Diagnosis present

## 2017-08-13 DIAGNOSIS — Y846 Urinary catheterization as the cause of abnormal reaction of the patient, or of later complication, without mention of misadventure at the time of the procedure: Secondary | ICD-10-CM | POA: Diagnosis present

## 2017-08-13 DIAGNOSIS — B9620 Unspecified Escherichia coli [E. coli] as the cause of diseases classified elsewhere: Secondary | ICD-10-CM | POA: Diagnosis not present

## 2017-08-13 DIAGNOSIS — G9341 Metabolic encephalopathy: Secondary | ICD-10-CM | POA: Diagnosis present

## 2017-08-13 LAB — COMPREHENSIVE METABOLIC PANEL
ALT: 24 U/L (ref 14–54)
AST: 26 U/L (ref 15–41)
Albumin: 3.5 g/dL (ref 3.5–5.0)
Alkaline Phosphatase: 92 U/L (ref 38–126)
Anion gap: 15 (ref 5–15)
BUN: 80 mg/dL — ABNORMAL HIGH (ref 6–20)
CHLORIDE: 100 mmol/L — AB (ref 101–111)
CO2: 25 mmol/L (ref 22–32)
Calcium: 9.5 mg/dL (ref 8.9–10.3)
Creatinine, Ser: 4.42 mg/dL — ABNORMAL HIGH (ref 0.44–1.00)
GFR calc non Af Amer: 10 mL/min — ABNORMAL LOW (ref >60)
GFR, EST AFRICAN AMERICAN: 12 mL/min — AB (ref >60)
Glucose, Bld: 126 mg/dL — ABNORMAL HIGH (ref 65–99)
POTASSIUM: 4.8 mmol/L (ref 3.5–5.1)
Sodium: 140 mmol/L (ref 135–145)
Total Bilirubin: 0.4 mg/dL (ref 0.3–1.2)
Total Protein: 9.4 g/dL — ABNORMAL HIGH (ref 6.5–8.1)

## 2017-08-13 LAB — BASIC METABOLIC PANEL
ANION GAP: 9 (ref 5–15)
Anion gap: 15 (ref 5–15)
BUN: 64 mg/dL — ABNORMAL HIGH (ref 6–20)
BUN: 37 mg/dL — ABNORMAL HIGH (ref 6–20)
CALCIUM: 7.6 mg/dL — AB (ref 8.9–10.3)
CHLORIDE: 108 mmol/L (ref 101–111)
CO2: 22 mmol/L (ref 22–32)
CO2: 27 mmol/L (ref 22–32)
CREATININE: 1.58 mg/dL — AB (ref 0.44–1.00)
Calcium: 8.9 mg/dL (ref 8.9–10.3)
Chloride: 112 mmol/L — ABNORMAL HIGH (ref 101–111)
Creatinine, Ser: 3.2 mg/dL — ABNORMAL HIGH (ref 0.44–1.00)
GFR, EST AFRICAN AMERICAN: 17 mL/min — AB (ref >60)
GFR, EST AFRICAN AMERICAN: 40 mL/min — AB (ref >60)
GFR, EST NON AFRICAN AMERICAN: 15 mL/min — AB (ref >60)
GFR, EST NON AFRICAN AMERICAN: 35 mL/min — AB (ref >60)
Glucose, Bld: 124 mg/dL — ABNORMAL HIGH (ref 65–99)
Glucose, Bld: 90 mg/dL (ref 65–99)
POTASSIUM: 4.3 mmol/L (ref 3.5–5.1)
Potassium: 4 mmol/L (ref 3.5–5.1)
SODIUM: 143 mmol/L (ref 135–145)
SODIUM: 150 mmol/L — AB (ref 135–145)

## 2017-08-13 LAB — CBC
HCT: 27.5 % — ABNORMAL LOW (ref 36.0–46.0)
HEMOGLOBIN: 8.7 g/dL — AB (ref 12.0–15.0)
MCH: 26 pg (ref 26.0–34.0)
MCHC: 31.6 g/dL (ref 30.0–36.0)
MCV: 82.3 fL (ref 78.0–100.0)
Platelets: 275 10*3/uL (ref 150–400)
RBC: 3.34 MIL/uL — ABNORMAL LOW (ref 3.87–5.11)
RDW: 16 % — ABNORMAL HIGH (ref 11.5–15.5)
WBC: 13.9 10*3/uL — AB (ref 4.0–10.5)

## 2017-08-13 LAB — CBC WITH DIFFERENTIAL/PLATELET
Basophils Absolute: 0 10*3/uL (ref 0.0–0.1)
Basophils Relative: 0 %
EOS ABS: 0.2 10*3/uL (ref 0.0–0.7)
Eosinophils Relative: 1 %
HCT: 37 % (ref 36.0–46.0)
HEMOGLOBIN: 12 g/dL (ref 12.0–15.0)
LYMPHS ABS: 3.7 10*3/uL (ref 0.7–4.0)
LYMPHS PCT: 20 %
MCH: 26.8 pg (ref 26.0–34.0)
MCHC: 32.4 g/dL (ref 30.0–36.0)
MCV: 82.8 fL (ref 78.0–100.0)
Monocytes Absolute: 1.3 10*3/uL — ABNORMAL HIGH (ref 0.1–1.0)
Monocytes Relative: 7 %
NEUTROS PCT: 72 %
Neutro Abs: 13.7 10*3/uL — ABNORMAL HIGH (ref 1.7–7.7)
Platelets: 414 10*3/uL — ABNORMAL HIGH (ref 150–400)
RBC: 4.47 MIL/uL (ref 3.87–5.11)
RDW: 16 % — ABNORMAL HIGH (ref 11.5–15.5)
WBC: 18.9 10*3/uL — ABNORMAL HIGH (ref 4.0–10.5)

## 2017-08-13 LAB — I-STAT ARTERIAL BLOOD GAS, ED
ACID-BASE DEFICIT: 3 mmol/L — AB (ref 0.0–2.0)
Bicarbonate: 23.1 mmol/L (ref 20.0–28.0)
O2 SAT: 73 %
PH ART: 7.344 — AB (ref 7.350–7.450)
PO2 ART: 39 mmHg — AB (ref 83.0–108.0)
Patient temperature: 96.7
TCO2: 24 mmol/L (ref 22–32)
pCO2 arterial: 41.9 mmHg (ref 32.0–48.0)

## 2017-08-13 LAB — CBG MONITORING, ED
GLUCOSE-CAPILLARY: 142 mg/dL — AB (ref 65–99)
GLUCOSE-CAPILLARY: 88 mg/dL (ref 65–99)
GLUCOSE-CAPILLARY: 94 mg/dL (ref 65–99)

## 2017-08-13 LAB — I-STAT CG4 LACTIC ACID, ED: LACTIC ACID, VENOUS: 1.63 mmol/L (ref 0.5–1.9)

## 2017-08-13 LAB — GLUCOSE, CAPILLARY
GLUCOSE-CAPILLARY: 224 mg/dL — AB (ref 65–99)
GLUCOSE-CAPILLARY: 79 mg/dL (ref 65–99)
Glucose-Capillary: 107 mg/dL — ABNORMAL HIGH (ref 65–99)

## 2017-08-13 LAB — PHOSPHORUS: PHOSPHORUS: 4.6 mg/dL (ref 2.5–4.6)

## 2017-08-13 LAB — PROTIME-INR
INR: 1.15
PROTHROMBIN TIME: 14.6 s (ref 11.4–15.2)

## 2017-08-13 LAB — MRSA PCR SCREENING: MRSA by PCR: NEGATIVE

## 2017-08-13 LAB — CORTISOL: CORTISOL PLASMA: 14 ug/dL

## 2017-08-13 LAB — MAGNESIUM: MAGNESIUM: 1.7 mg/dL (ref 1.7–2.4)

## 2017-08-13 LAB — PROCALCITONIN: PROCALCITONIN: 0.24 ng/mL

## 2017-08-13 LAB — ETHANOL: Alcohol, Ethyl (B): <10 mg/dL (ref <10)

## 2017-08-13 MED ORDER — FOLIC ACID 5 MG/ML IJ SOLN
1.0000 mg | Freq: Every day | INTRAMUSCULAR | Status: DC
  Administered 2017-08-13 – 2017-08-17 (×5): 1 mg via INTRAVENOUS
  Filled 2017-08-13 (×5): qty 0.2

## 2017-08-13 MED ORDER — SODIUM CHLORIDE 0.9 % IV SOLN
250.0000 mL | INTRAVENOUS | Status: DC | PRN
  Administered 2017-08-14: 250 mL via INTRAVENOUS

## 2017-08-13 MED ORDER — THIAMINE HCL 100 MG/ML IJ SOLN
100.0000 mg | Freq: Every day | INTRAMUSCULAR | Status: DC
  Administered 2017-08-13 – 2017-08-17 (×5): 100 mg via INTRAVENOUS
  Filled 2017-08-13 (×5): qty 2

## 2017-08-13 MED ORDER — COLLAGENASE 250 UNIT/GM EX OINT
TOPICAL_OINTMENT | Freq: Every day | CUTANEOUS | Status: DC
  Administered 2017-08-13 – 2017-08-15 (×3): via TOPICAL
  Administered 2017-08-16: 1 via TOPICAL
  Administered 2017-08-17 – 2017-08-19 (×3): via TOPICAL
  Filled 2017-08-13 (×2): qty 30

## 2017-08-13 MED ORDER — VANCOMYCIN HCL IN DEXTROSE 750-5 MG/150ML-% IV SOLN: 750.0000 mg | INTRAVENOUS | Status: DC

## 2017-08-13 MED ORDER — SODIUM CHLORIDE 0.9 % IV BOLUS (SEPSIS): 1000.0000 mL | Freq: Once | INTRAVENOUS | Status: DC

## 2017-08-13 MED ORDER — MAGNESIUM SULFATE 2 GM/50ML IV SOLN
2.0000 g | Freq: Once | INTRAVENOUS | Status: AC
  Administered 2017-08-13: 2 g via INTRAVENOUS
  Filled 2017-08-13: qty 50

## 2017-08-13 MED ORDER — SODIUM CHLORIDE 0.9 % IV BOLUS (SEPSIS)
1000.0000 mL | Freq: Once | INTRAVENOUS | Status: AC
  Administered 2017-08-13: 1000 mL via INTRAVENOUS

## 2017-08-13 MED ORDER — SODIUM CHLORIDE 0.9 % IV SOLN
INTRAVENOUS | Status: DC
  Administered 2017-08-13: 06:00:00 via INTRAVENOUS

## 2017-08-13 MED ORDER — SODIUM CHLORIDE 0.9 % IV BOLUS (SEPSIS)
1500.0000 mL | Freq: Once | INTRAVENOUS | Status: AC
  Administered 2017-08-13: 1500 mL via INTRAVENOUS

## 2017-08-13 MED ORDER — SODIUM BICARBONATE 8.4 % IV SOLN
50.0000 meq | Freq: Once | INTRAVENOUS | Status: AC
  Administered 2017-08-13: 50 meq via INTRAVENOUS
  Filled 2017-08-13: qty 50

## 2017-08-13 MED ORDER — VANCOMYCIN HCL IN DEXTROSE 1-5 GM/200ML-% IV SOLN
1000.0000 mg | Freq: Once | INTRAVENOUS | Status: AC
  Administered 2017-08-13: 1000 mg via INTRAVENOUS
  Filled 2017-08-13: qty 200

## 2017-08-13 MED ORDER — NOREPINEPHRINE BITARTRATE 1 MG/ML IV SOLN
0.0000 ug/min | Freq: Once | INTRAVENOUS | Status: DC
  Filled 2017-08-13: qty 4

## 2017-08-13 MED ORDER — NOREPINEPHRINE BITARTRATE 1 MG/ML IV SOLN
0.0000 ug/min | Freq: Once | INTRAVENOUS | Status: AC
  Administered 2017-08-13: 4 ug/min via INTRAVENOUS
  Filled 2017-08-13: qty 4

## 2017-08-13 MED ORDER — LACTATED RINGERS IV SOLN
INTRAVENOUS | Status: DC
Start: 2017-08-13 — End: 2017-08-13

## 2017-08-13 MED ORDER — SODIUM CHLORIDE 0.9 % IV SOLN: INTRAVENOUS | Status: DC | PRN

## 2017-08-13 MED ORDER — SODIUM CHLORIDE 0.9 % IV SOLN
1.0000 g | INTRAVENOUS | Status: AC
  Administered 2017-08-13 – 2017-08-17 (×5): 1 g via INTRAVENOUS
  Filled 2017-08-13 (×6): qty 10

## 2017-08-13 MED ORDER — NOREPINEPHRINE BITARTRATE 1 MG/ML IV SOLN
0.0000 ug/min | INTRAVENOUS | Status: DC
  Administered 2017-08-13: 2 ug/min via INTRAVENOUS
  Filled 2017-08-13 (×2): qty 4

## 2017-08-13 MED ORDER — HALOPERIDOL LACTATE 5 MG/ML IJ SOLN
3.0000 mg | Freq: Once | INTRAMUSCULAR | Status: AC
  Administered 2017-08-13: 3 mg via INTRAVENOUS
  Filled 2017-08-13: qty 1

## 2017-08-13 MED ORDER — INSULIN ASPART 100 UNIT/ML ~~LOC~~ SOLN
0.0000 [IU] | SUBCUTANEOUS | Status: DC
  Administered 2017-08-13: 5 [IU] via SUBCUTANEOUS
  Administered 2017-08-14: 2 [IU] via SUBCUTANEOUS
  Administered 2017-08-14 (×2): 3 [IU] via SUBCUTANEOUS
  Administered 2017-08-15 (×5): 2 [IU] via SUBCUTANEOUS
  Administered 2017-08-16 – 2017-08-18 (×2): 3 [IU] via SUBCUTANEOUS
  Administered 2017-08-18: 2 [IU] via SUBCUTANEOUS
  Administered 2017-08-19: 3 [IU] via SUBCUTANEOUS
  Administered 2017-08-19: 2 [IU] via SUBCUTANEOUS

## 2017-08-13 MED ORDER — HYDROCORTISONE NA SUCCINATE PF 100 MG IJ SOLR
100.0000 mg | Freq: Four times a day (QID) | INTRAMUSCULAR | Status: DC
  Administered 2017-08-13: 100 mg via INTRAVENOUS
  Filled 2017-08-13: qty 2

## 2017-08-13 MED ORDER — SODIUM CHLORIDE 0.9 % IV BOLUS (SEPSIS)
500.0000 mL | Freq: Once | INTRAVENOUS | Status: AC
  Administered 2017-08-13: 500 mL via INTRAVENOUS

## 2017-08-13 MED ORDER — PIPERACILLIN-TAZOBACTAM 3.375 G IVPB
3.3750 g | Freq: Once | INTRAVENOUS | Status: AC
  Administered 2017-08-13: 3.375 g via INTRAVENOUS
  Filled 2017-08-13: qty 50

## 2017-08-13 MED ORDER — STERILE WATER FOR INJECTION IV SOLN
INTRAVENOUS | Status: DC
  Administered 2017-08-13: 04:00:00 via INTRAVENOUS
  Filled 2017-08-13: qty 850

## 2017-08-13 MED ORDER — ORAL CARE MOUTH RINSE
15.0000 mL | Freq: Two times a day (BID) | OROMUCOSAL | Status: DC
  Administered 2017-08-13 – 2017-08-19 (×8): 15 mL via OROMUCOSAL

## 2017-08-13 MED ORDER — SODIUM CHLORIDE 0.9 % IV BOLUS (SEPSIS)
250.0000 mL | Freq: Once | INTRAVENOUS | Status: AC
  Administered 2017-08-13: 250 mL via INTRAVENOUS

## 2017-08-13 MED ORDER — HYDROCORTISONE NA SUCCINATE PF 100 MG IJ SOLR
50.0000 mg | Freq: Four times a day (QID) | INTRAMUSCULAR | Status: DC
  Administered 2017-08-13 – 2017-08-14 (×4): 50 mg via INTRAVENOUS
  Filled 2017-08-13 (×4): qty 2

## 2017-08-13 MED ORDER — HEPARIN (PORCINE) IN NACL 100-0.45 UNIT/ML-% IJ SOLN
1050.0000 [IU]/h | INTRAMUSCULAR | Status: AC
  Administered 2017-08-13: 850 [IU]/h via INTRAVENOUS
  Administered 2017-08-14: 1000 [IU]/h via INTRAVENOUS
  Administered 2017-08-15: 1050 [IU]/h via INTRAVENOUS
  Filled 2017-08-13 (×3): qty 250

## 2017-08-13 NOTE — Plan of Care (Signed)
  Progressing Clinical Measurements: Respiratory complications will improve 08/13/2017 1131 - Progressing by Larena Glassman E, RN Activity: Risk for activity intolerance will decrease 08/13/2017 1131 - Progressing by Boris Lown, RN Note PT eval ordered. Elimination: Will not experience complications related to bowel motility 08/13/2017 1131 - Progressing by Boris Lown, RN   Not Progressing Clinical Measurements: Ability to maintain clinical measurements within normal limits will improve 08/13/2017 1131 - Not Progressing by Boris Lown, RN Note Still requiring low dose levophed to maintain blood pressures. Nutrition: Adequate nutrition will be maintained 08/13/2017 1131 - Not Progressing by Boris Lown, RN Note Currently NPO. Speech eval ordered by MD due to history of dysphagia. Skin Integrity: Risk for impaired skin integrity will decrease 08/13/2017 1131 - Not Progressing by Boris Lown, RN Note Patient with an unstageable pressure ulcer on sacrum. WOC consulted and evaluated.   Not Met (add Reason) Elimination: Will not experience complications related to urinary retention 08/13/2017 1131 - Not Met (add Reason) by Boris Lown, RN Note Patient with chronic urinary retention/chronic foley catheter.

## 2017-08-13 NOTE — ED Notes (Signed)
Pt will wake up at times with painful stimulus. Will open her eyes and say OK, nothing more. Will not answer questions.  Then pt goes back to sleep.

## 2017-08-13 NOTE — ED Notes (Signed)
Brother to bedside. States that he saw her last about 2 months ago.

## 2017-08-13 NOTE — ED Notes (Signed)
Hives to left arm gone at this time. NS was the only fluid running in this arm at the start of the hives.

## 2017-08-13 NOTE — Progress Notes (Signed)
PCCM Interval Note   ED Nurse Informed that Systolic staying [id number] without improvement. Patient to be admitted to ICU. Levophed ordered for goal systolic >90. Stress dose steroids added.   Jovita KussmaulKatalina Jawanda Passey, AGACNP-BC [facility]  Pgr: [phone]  PCCM Pgr: [phone]

## 2017-08-13 NOTE — ED Notes (Addendum)
CBG 88. Was 290 at 2250 per EMS

## 2017-08-13 NOTE — Progress Notes (Signed)
PULMONARY / CRITICAL CARE MEDICINE   Name: Sarah OxfordFrieda Kercheval MRN: [id number] DOB: 01/16/1956    ADMISSION DATE:  08/12/2017 CONSULTATION DATE:  3/18/200109  REFERRING MD:  Dr. 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 Upon arrival BP 85/58. U/A positive. Crt 4.47. Patient given Vancomycin/Zosyn, 4L NS without improvement of BP and started on Levophed gtt.  Upon assessment in the ED patient systolic 130-150 on 10 mcg Levophed. Levophed titrated off. Systolic maintaining 90-100 systolic. Patient awakens to physical stimulation.    SUBJECTIVE:  No events overnight, continue to require levophed overnight  VITAL SIGNS: BP (!) 100/55 (BP Location: Left Arm)   Pulse (!) 103   Temp 97.7 F (36.5 C) (Oral)   Resp 12   Wt 114 lb 11.2 oz (52 kg)   LMP 03/25/2015 (Approximate)   SpO2 100%   BMI 20.32 kg/m    HEMODYNAMICS:    VENTILATOR SETTINGS:    INTAKE / OUTPUT: I/O last 3 completed shifts: In: 3360.6 [I.V.:360.6; IV Piggyback:3000] Out: 2100 [Urine:2100]  PHYSICAL EXAMINATION: General:  Chronically ill appearing female, NAD Neuro:  Awakens but lethargic HEENT:  [place]/AT, PERRL, EOM-I and MMM Cardiovascular:  Regular tachy, Nl S1/S2 and -M/R/G Lungs:  Decrease BS at the bases Abdomen:  Soft, NT, ND and +BS Musculoskeletal:  -edema  Skin:  Amputation of toes of left foot, Stage 2 sacral wound    LABS:  BMET Recent Labs  Lab 08/12/17 2231 08/13/17 0340  NA 140 143  K 4.8 4.0  CL 100* 112*  CO2 25 22  BUN 80* 64*  CREATININE 4.42* 3.20*  GLUCOSE 126* 124*   Electrolytes Recent Labs  Lab 08/12/17 2231 08/13/17 0340  CALCIUM 9.5 7.6*  MG  --  1.7  PHOS  --  4.6   CBC Recent Labs  Lab 08/12/17 2231 08/13/17 0340  WBC 18.9* 13.9*  HGB  12.0 8.7*  HCT 37.0 27.5*  PLT 414* 275   Coag's Recent Labs  Lab 08/12/17 2231  INR 1.15   Sepsis Markers Recent Labs  Lab 08/12/17 2243 08/13/17 0055 08/13/17 0340  LATICACIDVEN 1.48 1.63  --   PROCALCITON  --   --  0.24   ABG Recent Labs  Lab 08/13/17 0530  PHART 7.344*  PCO2ART 41.9  PO2ART 39.0*   Liver Enzymes Recent Labs  Lab 08/12/17 2231  AST 26  ALT 24  ALKPHOS 92  BILITOT 0.4  ALBUMIN 3.5   Cardiac Enzymes No results for input(s): TROPONINI, PROBNP in the last 168 hours.  Glucose Recent Labs  Lab 08/13/17 0029 08/13/17 0207 08/13/17 0539 08/13/17 0748  GLUCAP 88 142* 94 224*   Imaging Dg Chest 2 View  Result Date: 08/12/2017 CLINICAL DATA:  Generalized weakness EXAM: CHEST - 2 VIEW COMPARISON:  Chest CT April 29, 2017 and chest radiograph April 26, 2017 FINDINGS: There is no edema or consolidation. There is minimal right base atelectasis. Heart size and pulmonary vascularity are normal. No adenopathy. No evident bone lesions. IMPRESSION: Minimal right base atelectasis. No edema or consolidation. No adenopathy. Cardiac silhouette within normal limits. Electronically Signed   By: Bretta BangWilliam  Woodruff III M.D.   On: 08/12/2017 23:37   [place] Renal  Result Date: 08/13/2017 CLINICAL DATA:  Acute  kidney failure. EXAM: RENAL / URINARY TRACT ULTRASOUND COMPLETE COMPARISON:  CT 04/26/2017 FINDINGS: Right Kidney: Length: 10.7 cm. Echogenicity within normal limits. No mass or hydronephrosis visualized. Left Kidney: Length: 10.7 cm. Echogenicity within normal limits. No mass or hydronephrosis visualized. Bladder: Decompressed by Foley catheter. Gallstones are incidentally noted. IMPRESSION: Unremarkable sonographic appearance of the kidneys. Electronically Signed   By: Rubye Oaks M.D.   On: 08/13/2017 04:05   STUDIES:  CXR 3/17 > There is no edema or consolidation. There is minimal right base atelectasis. Heart size and pulmonary vascularity are normal.  No adenopathy. No evident bone lesions.  CULTURES: Blood 3/17 >> Urine 3/17 >>  ANTIBIOTICS: Zosyn 3/17>>>3/17 Vancomycin 3/17 Rocephin 3/18 >>  SIGNIFICANT EVENTS: 3/17 > Presents to ED   LINES/TUBES: PIV   DISCUSSION: 62 year old female with ETOH related dementia presents to ED from SNF with progressive AMS. Hypotensive on arrival. Found to have UTI. Given 4L NS without improvement of BP and started on Levophed gtt.   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  P:   Trend BMP  Replace electrolytes as indicated  Change out Foley Cath  Renal U/S pending  D/C NS and Bicarb drips  Chronic Left Upper Leg DVT  P:  Trend CBC  Heparin gtt until able to take PO, start at 6PM tonight  Urosepsis  Stage 2 Sacral Wound  H/O E.Coli UTI  P:   Trend WBC and Fever Curve Trend PCT Follow Culture Data  Rocephin and vanc Wound care consult appreciated    DM   P:   Trend glucose  SSI   Metabolic Encephalopathy    ETOH Dementia  P:   Monitor > Protecting airway, neuro status improving, baseline confusion but verbal  Folic acid/thiamine  D/C all sedating medications  FAMILY  - Updates: No family bedside.  - Inter-disciplinary family meet or Palliative Care meeting due by: 08/20/2017    The patient is critically ill with multiple organ systems failure and requires high complexity decision making for assessment and support, frequent evaluation and titration of therapies, application of advanced monitoring technologies and extensive interpretation of multiple databases.   Critical Care Time devoted to patient care services  described in this note is  35  Minutes. This time reflects time of care of this signee Dr Koren Bound. This critical care time does not reflect procedure time, or teaching time or supervisory time of PA/NP/Med student/Med Resident etc but could involve care discussion time.  Alyson Reedy, M.D. [facility]. Pager: [phone]. After hours pager: [phone].

## 2017-08-13 NOTE — ED Notes (Signed)
md to bedside. Pt having hives to lower right arm.

## 2017-08-13 NOTE — Progress Notes (Signed)
ANTICOAGULATION CONSULT NOTE - Initial Consult  Pharmacy Consult for Heparin (Xarelto on hold) Indication: History of DVT  No Known Allergies  Patient Measurements: 66.8 kg  Vital Signs: Temp: 96.7 F (35.9 C) (03/18 0235) Temp Source: Rectal (03/18 0235) BP: 81/55 (03/18 0540) Pulse Rate: 97 (03/18 0540)  Labs: Recent Labs    08/12/17 2231 08/13/17 0340  HGB 12.0 8.7*  HCT 37.0 27.5*  PLT 414* 275  LABPROT 14.6  --   INR 1.15  --   CREATININE 4.42* 3.20*    Estimated Creatinine Clearance: 17.4 mL/min (A) (by C-G formula based on SCr of 3.2 mg/dL (H)).   Medical History: Past Medical History:  Diagnosis Date  . Acute deep vein thrombosis (DVT) of left femoral vein (HCC) 02/22/2015  . Alcohol abuse   . Arthritis   . Chronic constipation 08/26/2015  . Dementia with behavioral problem   . Diabetes mellitus without complication (HCC)   . Dysphagia    and aspiration risk  . 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 Seizures [facility](HCC)    Assessment: 62 y/o F transfer from SNF for altered mental status. On Xarelto PTA for hx of DVT. Holding Xarelto and starting heparin during acute illness/acute kidney insufficiency. Hgb is 8.7 but patient has received multiple large fluid boluses in the ED (monitor). Will likely need to use aPTT to dose for the next several days given Xarelto influence on heparin levels.   Goal of Therapy:  Heparin level 0.3-0.7 units/ml aPTT 66-102 seconds Monitor platelets by anticoagulation protocol: Yes   Plan:  Start heparin drip at 850 units/hr today at 1800 0200 aPTT/HL on 3/19 Daily CBC/HL/aPTT Monitor for bleeding, trend Hgb  Abran DukeLedford, Jaslene Marsteller 08/13/2017,5:54 AM

## 2017-08-13 NOTE — ED Notes (Signed)
Pt very hypotensive at this time. MD aware. Fluid bolus started.

## 2017-08-13 NOTE — Progress Notes (Signed)
ANTIBIOTIC CONSULT NOTE  Pharmacy Consult for vancomycin Indication: sepsis  No Known Allergies  Patient Measurements: Weight: 114 lb 11.2 oz (52 kg)  Vital Signs: Temp: 97.7 F (36.5 C) (03/18 0806) Temp Source: Oral (03/18 0806) BP: 93/48 (03/18 1045) Pulse Rate: 97 (03/18 1045) Intake/Output from previous day: 03/17 0701 - 03/18 0700 In: 3360.6 [I.V.:360.6; IV Piggyback:3000] Out: 2100 [Urine:2100] Intake/Output from this shift: Total I/O In: 723.6 [I.V.:623.6; IV Piggyback:100] Out: 1226 [Urine:1225; Stool:1]  Labs: Recent Labs    08/12/17 2231 08/13/17 0340  WBC 18.9* 13.9*  HGB 12.0 8.7*  PLT 414* 275  CREATININE 4.42* 3.20*   Estimated Creatinine Clearance: 15.5 mL/min (A) (by C-G formula based on SCr of 3.2 mg/dL (H)). No results for input(s): VANCOTROUGH, VANCOPEAK, VANCORANDOM, GENTTROUGH, GENTPEAK, GENTRANDOM, TOBRATROUGH, TOBRAPEAK, TOBRARND, AMIKACINPEAK, AMIKACINTROU, AMIKACIN in the last 72 hours.   Microbiology: Recent Results (from the past 720 hour(s))  Culture, blood (Routine x 2)     Status: None (Preliminary result)   Collection Time: 08/12/17 10:30 PM  Result Value Ref Range Status   Specimen Description BLOOD LEFT ANTECUBITAL  Final   Special Requests   Final    BOTTLES DRAWN AEROBIC AND ANAEROBIC Blood Culture adequate volume Performed at [facility], [address] [place], [place] [address]    Culture PENDING  Incomplete   Report Status PENDING  Incomplete  Culture, blood (Routine x 2)     Status: None (Preliminary result)   Collection Time: 08/12/17 11:51 PM  Result Value Ref Range Status   Specimen Description BLOOD RIGHT ANTECUBITAL  Final   Special Requests   Final    IN PEDIATRIC BOTTLE Blood Culture adequate volume Performed at [facility], [address] [place], [place] [address]    Culture PENDING  Incomplete   Report Status PENDING  Incomplete  MRSA PCR Screening     Status: None   Collection Time:  08/13/17  6:42 AM  Result Value Ref Range Status   MRSA by PCR NEGATIVE NEGATIVE Final    Comment:        The GeneXpert MRSA Assay (FDA approved for NASAL specimens only), is one component of a comprehensive MRSA colonization surveillance program. It is not intended to diagnose MRSA infection nor to guide or monitor treatment for MRSA infections. Performed at [facility], [address] [place], [place] [address]     Medical History: Past Medical History:  Diagnosis Date  . Acute deep vein thrombosis (DVT) of left femoral vein (HCC) 02/22/2015  . Alcohol abuse   . Arthritis   . Chronic constipation 08/26/2015  . Dementia with behavioral problem   . Diabetes mellitus without complication (HCC)   . Dysphagia    and aspiration risk  . 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 Seizures [facility](HCC)     Assessment: 62 year old female admitted with sepsis and altered mental status, [facility].   Patient was given vancomycin and zosyn in ED prior to transferring to ICU. She was continued on ceftriaxone and new orders received to continue vancomycin. Patient with normal renal function last month, now with scr of 3.2. Will start vancomycin 48 hours post loading dose.   Goal of Therapy:  Vancomycin trough level 15-20 mcg/ml  Plan:  Start vancomycin 750 q48 hours 3/20 Continue ceftriaxone for now  Sheppard CoilFrank Wilson PharmD., BCPS Clinical Pharmacist 08/13/2017 11:21 AM

## 2017-08-13 NOTE — Consult Note (Signed)
PULMONARY / CRITICAL CARE MEDICINE   Name: Sarah Mckay MRN: [id number] DOB: 01/29/1956    ADMISSION DATE:  08/12/2017 CONSULTATION DATE:  3/18/200109  REFERRING MD:  Dr. 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 Upon arrival BP 85/58. U/A positive. Crt 4.47. Patient given Vancomycin/Zosyn, 4L NS without improvement of BP and started on Levophed gtt.  Upon assessment in the ED patient systolic 130-150 on 10 mcg Levophed. Levophed titrated off. Systolic maintaining 90-100 systolic. Patient awakens to physical stimulation.    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  PAST SURGICAL HISTORY: She  has no past surgical history on file.  No Known Allergies  No current facility-administered medications on file prior to encounter.    Current Outpatient Medications on File Prior to Encounter  Medication Sig  . Amino Acids-Protein Hydrolys (FEEDING SUPPLEMENT, PRO-STAT SUGAR FREE 64,) LIQD Take 30 mLs by mouth 2 (two) times daily.  Marland Kitchen. atorvastatin (LIPITOR) 10 MG tablet Take 10 mg by mouth daily.  . benztropine (COGENTIN) 0.5 MG tablet Take 0.5 mg by mouth at bedtime.  . cholecalciferol (VITAMIN D) 1000 UNITS tablet Take 1,000 Units by mouth daily. Reported on 10/28/2015  . Cranberry 400 MG TABS Take 1 tablet by mouth 2 (two) times daily.  . diclofenac sodium (VOLTAREN) 1 % GEL Apply 2 g topically 4 (four) times daily. Apply to both knees topically four times a day for pain.   Marland Kitchen. ENSURE  (ENSURE) Give 90cc by mouth three times a day in between meals for weight support  . gabapentin (NEURONTIN) 300 MG capsule Take 300 mg by mouth 3 (three) times daily.  Marland Kitchen. linaclotide (LINZESS) 72 MCG capsule Take 72 mcg by mouth daily before breakfast.  . linagliptin (TRADJENTA) 5 MG TABS tablet Take 5 mg by mouth daily.  Marland Kitchen. lisinopril (PRINIVIL,ZESTRIL) 2.5 MG tablet Take 2.5 mg by mouth daily. Hold for SBP < 110  . LORazepam (ATIVAN) 1 MG tablet Take 1 mg by mouth 2 (two) times daily.  . Melatonin 3 MG TABS Take 3 mg by mouth at bedtime.   . metFORMIN (GLUCOPHAGE) 500 MG tablet Take 500 mg by mouth daily.  . Multiple Vitamins-Minerals (DECUBI-VITE) CAPS Take 2 capsules by mouth at bedtime.  . Nutritional Supplements (NUTRITIONAL SUPPLEMENT PO) Take by mouth. HSG NAS Diet -  Mech soft texture, regular consistency  . OLANZapine (ZYPREXA) 10 MG tablet Take 10 mg by mouth at bedtime.  . polyethylene glycol (MIRALAX / GLYCOLAX) packet Take 17 g by mouth 2 (two) times daily.  . potassium chloride SA (K-DUR,KLOR-CON) 20 MEQ tablet Take 20 mEq by mouth 2 (two) times daily.   . rivaroxaban (XARELTO) 20 MG TABS tablet Take 20 mg by mouth at bedtime.  . senna-docusate (SENOKOT-S) 8.6-50 MG tablet Take 1 tablet by mouth 2 (two) times daily.  Marland Kitchen. thiamine 100 MG tablet Take 100 mg by mouth daily.  Marland Kitchen. torsemide (DEMADEX) 20 MG tablet Take 20 mg by mouth daily.   . traMADol (ULTRAM) 50 MG  tablet Take 1 tablet (50 mg total) by mouth every 6 (six) hours.    FAMILY HISTORY:  Her has no family status information on file.    SOCIAL HISTORY: She  reports that she has quit smoking. Her smoking use included cigarettes. She has a 7.50 pack-year smoking history. she has never used smokeless tobacco. She reports that she drinks about 1.2 oz of alcohol per week. She reports that she does not use drugs.  REVIEW OF SYSTEMS:   Unable to review as patient is encephalopathic    SUBJECTIVE:   VITAL SIGNS: BP (!) 157/63    Pulse 68   Temp (!) 96.7 F (35.9 C) (Rectal)   Resp 10   LMP 03/25/2015 (Approximate)   SpO2 100%   HEMODYNAMICS:    VENTILATOR SETTINGS:    INTAKE / OUTPUT: No intake/output data recorded.  PHYSICAL EXAMINATION: General:  Chronically ill adult female, no distress  Neuro:  Awakens to physical stimulation, pupils intact   HEENT:  Dry MM  Cardiovascular:  Tachy, no MRG  Lungs:  Clear breath sounds, no wheeze/crackles  Abdomen:  Non-distended, active bowel sounds  Musculoskeletal:  -edema  Skin:  Amputation of toes of left foot, Stage 2 sacral wound    LABS:  BMET Recent Labs  Lab 08/12/17 2231  NA 140  K 4.8  CL 100*  CO2 25  BUN 80*  CREATININE 4.42*  GLUCOSE 126*    Electrolytes Recent Labs  Lab 08/12/17 2231  CALCIUM 9.5    CBC Recent Labs  Lab 08/12/17 2231  WBC 18.9*  HGB 12.0  HCT 37.0  PLT 414*    Coag's Recent Labs  Lab 08/12/17 2231  INR 1.15    Sepsis Markers Recent Labs  Lab 08/12/17 2243 08/13/17 0055  LATICACIDVEN 1.48 1.63    ABG No results for input(s): PHART, PCO2ART, PO2ART in the last 168 hours.  Liver Enzymes Recent Labs  Lab 08/12/17 2231  AST 26  ALT 24  ALKPHOS 92  BILITOT 0.4  ALBUMIN 3.5    Cardiac Enzymes No results for input(s): TROPONINI, PROBNP in the last 168 hours.  Glucose Recent Labs  Lab 08/13/17 0029 08/13/17 0207  GLUCAP 88 142*    Imaging Dg Chest 2 View  Result Date: 08/12/2017 CLINICAL DATA:  Generalized weakness EXAM: CHEST - 2 VIEW COMPARISON:  Chest CT April 29, 2017 and chest radiograph April 26, 2017 FINDINGS: There is no edema or consolidation. There is minimal right base atelectasis. Heart size and pulmonary vascularity are normal. No adenopathy. No evident bone lesions. IMPRESSION: Minimal right base atelectasis. No edema or consolidation. No adenopathy. Cardiac silhouette within normal limits. Electronically Signed   By: Bretta Bang III M.D.   On: 08/12/2017  23:37     STUDIES:  CXR 3/17 > There is no edema or consolidation. There is minimal right base atelectasis. Heart size and pulmonary vascularity are normal. No adenopathy. No evident bone lesions.  CULTURES: Blood 3/17 >> Urine 3/17 >>  ANTIBIOTICS: Zosyn 3/17  Vancomycin 3/17 Rocephin 3/18 >>  SIGNIFICANT EVENTS: 3/17 > Presents to ED   LINES/TUBES: PIV   DISCUSSION: 62 year old female with ETOH related dementia presents to ED from SNF with progressive AMS. Hypotensive on arrival. Found to have UTI. Given 4L NS without improvement of BP and started on Levophed gtt.   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  P:   Trend BMP  Replace electrolytes as indicated  Change out Foley Cath  Renal U/S pending   Chronic Left Upper Leg DVT  P:  Trend CBC  Heparin gtt until able to take PO   Urosepsis  Stage 2 Sacral Wound  H/O E.Coli UTI  P:   Trend WBC and Fever Curve Trend PCT Follow Culture Data  Rocephin  Wound care consult     DM   P:   Trend glucose  SSI   Metabolic Encephalopathy    ETOH Dementia  P:   Monitor > Protecting airway, neuro status improving, baseline confusion but verbal  Folic acid/thiamine    FAMILY  - Updates: Brother updated at bedside   - Inter-disciplinary family meet or Palliative Care meeting due by: 08/20/2017    Have asked [facility] to assess for admission as patient is now off pressors and maintaining adequate BP with good urinary output with improving mentation.   Jovita Kussmaul, AGACNP-BC [place] [facility]  Pgr: [phone]  PCCM Pgr: [phone]

## 2017-08-13 NOTE — Progress Notes (Signed)
ABG obtained.Venous results given to NP. Verbal order to disregard placing Arterial line at this time.  Ph 7.34 CO2 41.9 PO2 39 HCO3 23

## 2017-08-13 NOTE — ED Notes (Signed)
Nurse will draw labs. 

## 2017-08-13 NOTE — ED Notes (Signed)
Sarah Mckay ([name]) [phone]. Lives close by and will be back later today.

## 2017-08-13 NOTE — Progress Notes (Signed)
Patient from SNF with chronic foley. Foley cath exchanged. Peri care completed and stool removed from vagina. 14 Fr. Cath placed with cloudy yellow urine noted on return. Foley care completed post insertion. Patient tolerated well.

## 2017-08-13 NOTE — ED Notes (Signed)
Pt had small BM. Rolled and cleaned up. Diaper changed and decub dressing changed (soiled with stool).  Another Allevyn life dressing applied to sacral area. Sacral area very red and raw. No purulent drainage at this time. MD to bedside. States that he is going to call family about pt status.

## 2017-08-13 NOTE — ED Notes (Signed)
Checked CBG 94, RN Candy informed

## 2017-08-13 NOTE — Consult Note (Addendum)
WOC Nurse wound consult note Reason for Consult: Consult requested for sacrum and buttocks.  [facility]. Wound type: There are several pink dry scars to ischium and buttocks from previous wounds which have healed.   Pt has a deep tissue injury to inner gluteal fold/sacrum which is evolving into unstageable in areas. Deep tissue injury extends approx 5X4cm across bilat buttocks and sacrum; dark purple, moist macerated and loose peeling skin to edges.  Inner gluteal fold/sacrum with unstageable pressure injury; 2X1cm, soft black eschar, some odor, mod amt tan drainage. Pt is frequently incontinent of stool and it is difficult to keep wound from becoming soiled related to close proximity to rectum. Pressure Injury POA: Yes Dressing procedure/placement/frequency: Pt is on a low airloss bed to reduce pressure.  Begin Santyl ointment to provide enzymatic debridement of nonviable tissue.  Foam dressing to protect from further injury.  Pt does not appear to understand and there are no family members present to discuss plan of care. Please re-consult if further assistance is needed.  Thank-you,  Cammie Mcgeeawn Charlen Bakula MSN, RN, CWOCN, [place]AP, CNS [phone]

## 2017-08-13 NOTE — ED Notes (Signed)
RN notified of sepsis  

## 2017-08-13 NOTE — ED Notes (Signed)
MD at bedside. 

## 2017-08-14 DIAGNOSIS — A419 Sepsis, unspecified organism: Secondary | ICD-10-CM

## 2017-08-14 DIAGNOSIS — G9341 Metabolic encephalopathy: Secondary | ICD-10-CM

## 2017-08-14 DIAGNOSIS — E870 Hyperosmolality and hypernatremia: Secondary | ICD-10-CM

## 2017-08-14 DIAGNOSIS — R6521 Severe sepsis with septic shock: Secondary | ICD-10-CM

## 2017-08-14 LAB — BLOOD CULTURE ID PANEL (REFLEXED)
ACINETOBACTER BAUMANNII: NOT DETECTED
CANDIDA ALBICANS: NOT DETECTED
CANDIDA GLABRATA: NOT DETECTED
CANDIDA KRUSEI: NOT DETECTED
CANDIDA PARAPSILOSIS: NOT DETECTED
Candida tropicalis: NOT DETECTED
ENTEROBACTER CLOACAE COMPLEX: NOT DETECTED
ENTEROBACTERIACEAE SPECIES: NOT DETECTED
ENTEROCOCCUS SPECIES: NOT DETECTED
ESCHERICHIA COLI: NOT DETECTED
Haemophilus influenzae: NOT DETECTED
KLEBSIELLA OXYTOCA: NOT DETECTED
Klebsiella pneumoniae: NOT DETECTED
LISTERIA MONOCYTOGENES: NOT DETECTED
Methicillin resistance: NOT DETECTED
Neisseria meningitidis: NOT DETECTED
PSEUDOMONAS AERUGINOSA: NOT DETECTED
Proteus species: NOT DETECTED
SERRATIA MARCESCENS: NOT DETECTED
STREPTOCOCCUS AGALACTIAE: NOT DETECTED
STREPTOCOCCUS PNEUMONIAE: NOT DETECTED
STREPTOCOCCUS PYOGENES: NOT DETECTED
Staphylococcus aureus (BCID): NOT DETECTED
Staphylococcus species: DETECTED — AB
Streptococcus species: NOT DETECTED

## 2017-08-14 LAB — GLUCOSE, CAPILLARY
GLUCOSE-CAPILLARY: 70 mg/dL (ref 65–99)
GLUCOSE-CAPILLARY: 88 mg/dL (ref 65–99)
GLUCOSE-CAPILLARY: 159 mg/dL — AB (ref 65–99)
GLUCOSE-CAPILLARY: 112 mg/dL — AB (ref 65–99)
Glucose-Capillary: 116 mg/dL — ABNORMAL HIGH (ref 65–99)
Glucose-Capillary: 124 mg/dL — ABNORMAL HIGH (ref 65–99)
Glucose-Capillary: 81 mg/dL (ref 65–99)
Glucose-Capillary: 90 mg/dL (ref 65–99)

## 2017-08-14 LAB — CBC
HEMATOCRIT: 31 % — AB (ref 36.0–46.0)
Hemoglobin: 10 g/dL — ABNORMAL LOW (ref 12.0–15.0)
MCH: 26.7 pg (ref 26.0–34.0)
MCHC: 32.3 g/dL (ref 30.0–36.0)
MCV: 82.9 fL (ref 78.0–100.0)
PLATELETS: 335 10*3/uL (ref 150–400)
RBC: 3.74 MIL/uL — AB (ref 3.87–5.11)
RDW: 16.1 % — ABNORMAL HIGH (ref 11.5–15.5)
WBC: 13.8 10*3/uL — ABNORMAL HIGH (ref 4.0–10.5)

## 2017-08-14 LAB — BASIC METABOLIC PANEL
Anion gap: 17 — ABNORMAL HIGH (ref 5–15)
BUN: 24 mg/dL — ABNORMAL HIGH (ref 6–20)
CHLORIDE: 111 mmol/L (ref 101–111)
CO2: 23 mmol/L (ref 22–32)
CREATININE: 1.09 mg/dL — AB (ref 0.44–1.00)
Calcium: 9.1 mg/dL (ref 8.9–10.3)
GFR calc non Af Amer: 54 mL/min — ABNORMAL LOW (ref >60)
Glucose, Bld: 106 mg/dL — ABNORMAL HIGH (ref 65–99)
Potassium: 3.5 mmol/L (ref 3.5–5.1)
Sodium: 151 mmol/L — ABNORMAL HIGH (ref 135–145)

## 2017-08-14 LAB — MAGNESIUM: Magnesium: 2 mg/dL (ref 1.7–2.4)

## 2017-08-14 LAB — APTT
APTT: 48 s — AB (ref 24–36)
APTT: 65 s — AB (ref 24–36)

## 2017-08-14 LAB — PHOSPHORUS: Phosphorus: 1.9 mg/dL — ABNORMAL LOW (ref 2.5–4.6)

## 2017-08-14 LAB — HEPARIN LEVEL (UNFRACTIONATED): Heparin Unfractionated: 0.42 IU/mL (ref 0.30–0.70)

## 2017-08-14 LAB — PROCALCITONIN: Procalcitonin: <0.1 ng/mL

## 2017-08-14 MED ORDER — HALOPERIDOL LACTATE 5 MG/ML IJ SOLN
1.0000 mg | INTRAMUSCULAR | Status: DC | PRN
  Administered 2017-08-14 – 2017-08-18 (×6): 1 mg via INTRAVENOUS
  Filled 2017-08-14 (×6): qty 1

## 2017-08-14 MED ORDER — HALOPERIDOL LACTATE 5 MG/ML IJ SOLN
3.0000 mg | Freq: Once | INTRAMUSCULAR | Status: AC
  Administered 2017-08-14: 3 mg via INTRAVENOUS

## 2017-08-14 MED ORDER — HYDROCORTISONE NA SUCCINATE PF 100 MG IJ SOLR
25.0000 mg | Freq: Four times a day (QID) | INTRAMUSCULAR | Status: DC
  Administered 2017-08-14 – 2017-08-15 (×4): 25 mg via INTRAVENOUS
  Filled 2017-08-14 (×4): qty 2

## 2017-08-14 MED ORDER — LORAZEPAM 1 MG PO TABS
1.0000 mg | ORAL_TABLET | Freq: Two times a day (BID) | ORAL | Status: DC | PRN
  Administered 2017-08-15 – 2017-08-19 (×6): 1 mg via ORAL
  Filled 2017-08-14 (×7): qty 1

## 2017-08-14 MED ORDER — HALOPERIDOL LACTATE 5 MG/ML IJ SOLN
INTRAMUSCULAR | Status: AC
  Filled 2017-08-14: qty 1

## 2017-08-14 MED ORDER — DEXTROSE 5 % IV SOLN
INTRAVENOUS | Status: AC
  Administered 2017-08-14 – 2017-08-15 (×2): via INTRAVENOUS

## 2017-08-14 MED ORDER — POTASSIUM PHOSPHATES 15 MMOLE/5ML IV SOLN
30.0000 mmol | Freq: Once | INTRAVENOUS | Status: AC
  Administered 2017-08-14: 30 mmol via INTRAVENOUS
  Filled 2017-08-14: qty 10

## 2017-08-14 NOTE — Progress Notes (Signed)
ANTICOAGULATION CONSULT NOTE  Pharmacy Consult for Heparin (Xarelto on hold) Indication: History of DVT  No Known Allergies  Patient Measurements: 66.8 kg  Vital Signs: Temp: 98.4 F (36.9 C) (03/19 0700) Temp Source: Oral (03/19 0700) BP: 131/75 (03/19 0800) Pulse Rate: 115 (03/19 0800)  Labs: Recent Labs    08/12/17 2231 08/13/17 0340 08/13/17 1534 08/14/17 0229  HGB 12.0 8.7*  --  10.0*  HCT 37.0 27.5*  --  31.0*  PLT 414* 275  --  335  APTT  --   --   --  48*  LABPROT 14.6  --   --   --   INR 1.15  --   --   --   HEPARINUNFRC  --   --   --  0.42  CREATININE 4.42* 3.20* 1.58* 1.09*    Estimated Creatinine Clearance: 46 mL/min (A) (by C-G formula based on SCr of 1.09 mg/dL (H)).   Medical History: Past Medical History:  Diagnosis Date  . Acute deep vein thrombosis (DVT) of left femoral vein (HCC) 02/22/2015  . Alcohol abuse   . Arthritis   . Chronic constipation 08/26/2015  . Dementia with behavioral problem   . Diabetes mellitus without complication (HCC)   . Dysphagia    and aspiration risk  . 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 Seizures [facility](HCC)    Assessment: 62 y/o F transfer from SNF for altered mental status. On Xarelto PTA for hx of DVT. Holding Xarelto and starting heparin during acute illness/acute kidney insufficiency.   HL 0.4 likely influenced from residual xarelto. Aptt low at 48s. Hgb improved at 10, plt within normal limits. No bleeding issues noted.   Goal of Therapy:  Heparin level 0.3-0.7 units/ml aPTT 66-102 seconds Monitor platelets by anticoagulation protocol: Yes   Plan:  Increase heparin drip to 1000 units/hr 1800 aptt Daily CBC/HL/aPTT Monitor for bleeding, trend Hgb  Sheppard CoilFrank Giankarlo Leamer PharmD., BCPS Clinical Pharmacist 08/14/2017 10:01 AM

## 2017-08-14 NOTE — Progress Notes (Signed)
eLink Physician-Brief Progress Note Patient Name: Sarah OxfordFrieda Mckay DOB: 09/05/1955 MRN: [id number]   Date of Service  08/14/2017  HPI/Events of Note  Agitation/Delirium - Patient with Hx of dementia. OTc interval = 0.48 seconds.   eICU Interventions  Will order: 1. Haldol 1 mg IV Q 3 hours PRN agitation/delirium. 2. Monitor QTc interval Q 6 hours. Nofify MD if QTc interval > 500 milliseconds.      Intervention Category Major Interventions: Delirium, psychosis, severe agitation - evaluation and management Minor Interventions: Agitation / anxiety - evaluation and management  Delle Andrzejewski Eugene 08/14/2017, 10:15 PM

## 2017-08-14 NOTE — Progress Notes (Signed)
PULMONARY / CRITICAL CARE MEDICINE   Name: Sarah Mckay MRN: [id number] DOB: 04/17/1956    ADMISSION DATE:  08/12/2017 CONSULTATION DATE:  3/18/200109  REFERRING MD:  Dr. 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 Upon arrival BP 85/58. U/A positive. Crt 4.47. Patient given Vancomycin/Zosyn, 4L NS without improvement of BP and started on Levophed gtt.  Upon assessment in the ED patient systolic 130-150 on 10 mcg Levophed. Levophed titrated off. Systolic maintaining 90-100 systolic. Patient awakens to physical stimulation.    SUBJECTIVE:  Off pressors overnight, remains confused.  VITAL SIGNS: BP 131/75   Pulse (!) 115   Temp 98.4 F (36.9 C) (Oral)   Resp (!) 21   Ht 5\' 3"  (1.6 m)   Wt 127 lb 6.8 oz (57.8 kg)   LMP 03/25/2015 (Approximate)   SpO2 100%   BMI 22.57 kg/m   HEMODYNAMICS:    VENTILATOR SETTINGS:    INTAKE / OUTPUT: I/O last 3 completed shifts: In: 4649.4 [I.V.:1549.4; IV Piggyback:3100] Out: 4982 [Urine:4980; Stool:2]  PHYSICAL EXAMINATION: General:  Chronically ill appearing female, NAD Neuro:  Awake in a chair but confused HEENT:  [place]/AT, PERRL, EOM-I and MMM Cardiovascular:  RRR, Nl S1/S2 and -M/R/G. Lungs:  CTA bilaterally Abdomen:  Soft, NT, ND and +BS Musculoskeletal:  -edema  Skin:  Amputation of toes of left foot, Stage 2 sacral wound    LABS:  BMET Recent Labs  Lab 08/13/17 0340 08/13/17 1534 08/14/17 0229  NA 143 150* 151*  K 4.0 4.3 3.5  CL 112* 108 111  CO2 22 27 23   BUN 64* 37* 24*  CREATININE 3.20* 1.58* 1.09*  GLUCOSE 124* 90 106*   Electrolytes Recent Labs  Lab 08/13/17 0340 08/13/17 1534 08/14/17 0229  CALCIUM 7.6* 8.9 9.1  MG 1.7  --  2.0  PHOS 4.6  --  1.9*   CBC Recent Labs  Lab  08/12/17 2231 08/13/17 0340 08/14/17 0229  WBC 18.9* 13.9* 13.8*  HGB 12.0 8.7* 10.0*  HCT 37.0 27.5* 31.0*  PLT 414* 275 335   Coag's Recent Labs  Lab 08/12/17 2231 08/14/17 0229  APTT  --  48*  INR 1.15  --    Sepsis Markers Recent Labs  Lab 08/12/17 2243 08/13/17 0055 08/13/17 0340 08/14/17 0229  LATICACIDVEN 1.48 1.63  --   --   PROCALCITON  --   --  0.24 <0.10   ABG Recent Labs  Lab 08/13/17 0530  PHART 7.344*  PCO2ART 41.9  PO2ART 39.0*   Liver Enzymes Recent Labs  Lab 08/12/17 2231  AST 26  ALT 24  ALKPHOS 92  BILITOT 0.4  ALBUMIN 3.5   Cardiac Enzymes No results for input(s): TROPONINI, PROBNP in the last 168 hours.  Glucose Recent Labs  Lab 08/13/17 1418 08/13/17 1547 08/13/17 1943 08/13/17 2348 08/14/17 0352 08/14/17 0818  GLUCAP 79 88 107* 124* 116* 159*   Imaging No results found. STUDIES:  CXR 3/17 > There is no edema or consolidation. There is minimal right base atelectasis. Heart size and pulmonary vascularity are normal. No adenopathy. No evident bone lesions.  CULTURES: Blood 3/17 >> Urine 3/17 >>  ANTIBIOTICS: Zosyn 3/17>>>3/17 Vancomycin 3/17 Rocephin 3/18 >>  SIGNIFICANT EVENTS: 3/17 > Presents to ED  LINES/TUBES: PIV   DISCUSSION: 62 year old female with ETOH related dementia presents to ED from SNF with progressive AMS. Hypotensive on arrival. Found to have UTI. Given 4L NS without improvement of BP and started on Levophed gtt.   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  P:   Trend BMP  Replace electrolytes as indicated  Change out Foley Cath   Start D5W at 50 ml/hr x24 hours Replace K3PO4 30 mmol  Chronic Left Upper Leg DVT  P:  Trend CBC  Heparin gtt until able to take PO, start at 6PM tonight  Urosepsis  Stage 2 Sacral Wound  H/O E.Coli UTI  P:   Trend WBC and Fever Curve Follow Culture Data  Rocephin D/C vanc, coag neg staph Wound care consult appreciated    DM   P:   Trend glucose  SSI   Metabolic Encephalopathy    ETOH Dementia  P:   Monitor > Protecting airway, neuro status improving, baseline confusion but verbal  Folic acid/thiamine  D/C all sedating medications  FAMILY  - Updates: No family bedside.  - Inter-disciplinary family meet or Palliative Care meeting due by: 08/20/2017    The patient is critically ill with multiple organ systems failure and requires high complexity decision making for assessment and support, frequent evaluation and titration of therapies, application of advanced monitoring technologies and extensive interpretation of multiple databases.   Critical Care Time devoted to patient care services described in this note is  35  Minutes. This time reflects time of care of this signee Dr Koren Bound. This critical care time does not reflect procedure time, or teaching time or supervisory time of PA/NP/Med student/Med Resident etc but could involve care discussion time.  Alyson Reedy, M.D. [facility] Pulmonary/Critical Care Medicine. Pager: [phone]. After hours pager: [phone].

## 2017-08-14 NOTE — Evaluation (Signed)
Physical Therapy Evaluation Patient Details Name: Sarah Mckay MRN: [id number] DOB: 1956/05/27 Today's Date: 08/14/2017   History of Present Illness  Patient is a 62 y.o. 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 Found to have spesis, UTI; also with AKI and chronic LLE DVT. CXR shows minimal right base atelectasis.     Clinical Impression  Pt presents with an overall decrease in functional mobility secondary to above. Pt A&Ox1 and unable to provide details regarding residence or PLOF. At one point stated "I enjoy walking," but unsure pt is ambulatory at baseline. Required maxA to squat pivot to chair and maxA+2 to stand; dependent for pericare due to incontinence during session. Able to assist with BUEs for scooting, and resists BLE PROM strongly, but not consistently following commands or actively assisting PT/RN during transfers. Pt would benefit from continued acute PT services to maximize functional mobility and independence prior to return to extended care facility.     Follow Up Recommendations SNF;Supervision/Assistance - 24 hour    Equipment Recommendations  None recommended by PT    Recommendations for Other Services       Precautions / Restrictions Precautions Precautions: Fall Restrictions Weight Bearing Restrictions: No      Mobility  Bed Mobility Overal bed mobility: Needs Assistance Bed Mobility: Supine to Sit     Supine to sit: Mod assist     General bed mobility comments: Pt not moving BLEs to command in bed, requiring modA to bring them to EOB and assist trunk elevation. Once sitting EOB, able to scoot hips back with use of BUEs and min guard  Transfers Overall transfer level: Needs assistance Equipment used: 2 person hand held assist Transfers: Sit to/from [employer] Sit to  Stand: Max assist;+2 physical assistance   Squat pivot transfers: Max assist     General transfer comment: Pt not actively assisting with transfers, requiring physical placement of feet for squat pivot (although resisting this) and maxA to pivot. Pt incontinent of bowel, requiring maxA+2 to stand for pericare and again for chair alarm placement  Ambulation/Gait             General Gait Details: Deferred  Stairs            Wheelchair Mobility    Modified Rankin (Stroke Patients Only)       Balance Overall balance assessment: Needs assistance   Sitting balance-Leahy Scale: Fair       Standing balance-Leahy Scale: Zero                               Pertinent Vitals/Pain Pain Assessment: Faces Faces Pain Scale: Hurts little more Pain Location: Generalized Pain Descriptors / Indicators: Grimacing;Guarding Pain Intervention(s): Monitored during session;Repositioned    Home Living Family/patient expects to be discharged to:: Skilled nursing facility                 Additional Comments: Pt not able to provide details regarding PLOF and living at [facility]. Per chart, from SNF    Prior Function Level of Independence: Needs assistance   Gait / Transfers Assistance Needed: Unsure pt's PLOF. Assume she needed assist for transfers, unsure if ambulatory  ADL's / Homemaking Assistance Needed: Unsure PLOF; assume needed assist for all ADLs at [facility]  Hand Dominance        Extremity/Trunk Assessment   Upper Extremity Assessment Upper Extremity Assessment: Generalized weakness    Lower Extremity Assessment Lower Extremity Assessment: Generalized weakness;Difficult to assess due to impaired cognition       Communication   Communication: Expressive difficulties  Cognition Arousal/Alertness: Awake/alert Behavior During Therapy: Anxious Overall Cognitive Status: History of cognitive impairments - at baseline Area of Impairment:  Orientation;Attention;Following commands;Safety/judgement;Awareness;Problem solving                 Orientation Level: Disoriented to;Place;Time;Situation Current Attention Level: Sustained   Following Commands: Follows one step commands inconsistently Safety/Judgement: Decreased awareness of safety;Decreased awareness of deficits Awareness: Intellectual Problem Solving: Decreased initiation;Slow processing;Requires verbal cues;Requires tactile cues General Comments: Pt mumbling to self and perseverating on certain words, not able to talk in complete sentences; able to respond to "slow down, and tell me what you're trying to say," but then would start mumbling/repeating words after getting ~5 words out.      General Comments      Exercises     Assessment/Plan    PT Assessment Patient needs continued PT services  PT Problem List Decreased strength;Decreased activity tolerance;Decreased balance;Decreased mobility;Decreased cognition;Decreased knowledge of use of DME;Decreased safety awareness       PT Treatment Interventions DME instruction;Gait training;Functional mobility training;Therapeutic activities;Therapeutic exercise;Patient/family education    PT Goals (Current goals can be found in the Care Plan section)  Acute Rehab PT Goals Patient Stated Goal: None stated PT Goal Formulation: Patient unable to participate in goal setting Time For Goal Achievement: 08/28/17    Frequency Min 2X/week   Barriers to discharge        Co-evaluation               AM-PAC PT "6 Clicks" Daily Activity  Outcome Measure Difficulty turning over in bed (including adjusting bedclothes, sheets and blankets)?: Unable Difficulty moving from lying on back to sitting on the side of the bed? : Unable Difficulty sitting down on and standing up from a chair with arms (e.g., wheelchair, bedside commode, etc,.)?: Unable Help needed moving to and from a bed to chair (including a  wheelchair)?: A Lot Help needed walking in hospital room?: Total Help needed climbing 3-5 steps with a railing? : Total 6 Click Score: 7    End of Session Equipment Utilized During Treatment: Gait belt Activity Tolerance: Patient limited by fatigue Patient left: in chair;with call bell/phone within reach;with chair alarm set;with nursing/sitter in room Nurse Communication: Mobility status PT Visit Diagnosis: Other abnormalities of gait and mobility (R26.89);Muscle weakness (generalized) (M62.81)    Time: [id number] PT Time Calculation (min) (ACUTE ONLY): 45 min   Charges:   PT Evaluation $PT Eval Moderate Complexity: 1 Mod PT Treatments $Therapeutic Activity: 23-37 mins   PT G Codes:       Ina HomesJaclyn Dejuana Weist, PT, DPT Acute Rehab Services  Pager: [phone]  Malachy ChamberJaclyn L Melvern Ramone 08/14/2017, 12:18 PM

## 2017-08-14 NOTE — Evaluation (Signed)
Clinical/Bedside Swallow Evaluation Patient Details  Name: Sarah OxfordFrieda Mckay MRN: [id number] Date of Birth: 12/17/1955  Today's Date: 08/14/2017 Time: SLP Start Time (ACUTE ONLY): 1026 SLP Stop Time (ACUTE ONLY): 1050 SLP Time Calculation (min) (ACUTE ONLY): 24 min  Past Medical History:  Past Medical History:  Diagnosis Date  . Acute deep vein thrombosis (DVT) of left femoral vein (HCC) 02/22/2015  . Alcohol abuse   . Arthritis   . Chronic constipation 08/26/2015  . Dementia with behavioral problem   . Diabetes mellitus without complication (HCC)   . Dysphagia    and aspiration risk  . 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 Seizures (HCC)    Past Surgical History: No past surgical history on file. 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 Found to have spesis, UTI, CXR minimal right base atelectasis. No edema or consolidation. No adenopathy. Cardiac silhouette within normal limits. BSE 04/27/17 functional oropharyngeal swallow, decreased mastication, endentulous, Dys 3/thin.   Assessment / Plan / Recommendation Clinical Impression  Pt exhibits a cognitive based oropharyngeal dysphagia leading to epectoration of full cup water with initial trial. Subsequent 3 oz water via straw consumed consecutively withoutward signs of pharyngeal invasion. Small amounts applesauce accepted and solids not trialed during assessment due to cognitive status and lack of dentition. Pt follow simple commands, is echolalic with difficulty expressing her needs. ST recommends Dys 1 (puree), thin, crush meds and full supervision.   SLP Visit Diagnosis: Dysphagia, unspecified (R13.10)    Aspiration Risk  Mild aspiration risk;Moderate aspiration risk    Diet Recommendation Dysphagia 1 (Puree);Thin liquid   Liquid  Administration via: Cup;Straw Medication Administration: Crushed with puree Supervision: Staff to assist with self feeding;Full supervision/cueing for compensatory strategies Compensations: Slow rate;Minimize environmental distractions;Small sips/bites Postural Changes: Seated upright at 90 degrees    Other  Recommendations Oral Care Recommendations: Oral care BID   Follow up Recommendations None      Frequency and Duration min 1 x/week  2 weeks       Prognosis Prognosis for Safe Diet Advancement: (fair-good) Barriers to Reach Goals: Cognitive deficits      Swallow Study   General HPI: Patient is a 62 year old female with past medical history of dementia, 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 Found to have spesis, UTI, CXR minimal right base atelectasis. No edema or consolidation. No adenopathy. Cardiac silhouette within normal limits. BSE 04/27/17 functional oropharyngeal swallow, decreased mastication, endentulous, Dys 3/thin. Type of Study: Bedside Swallow Evaluation Previous Swallow Assessment: (see hPI) Diet Prior to this Study: NPO Temperature Spikes Noted: No Respiratory Status: Room air History of Recent Intubation: No Behavior/Cognition: Alert;Requires cueing Oral Cavity Assessment: Dry Oral Care Completed by SLP: Yes Oral Cavity - Dentition: Edentulous Vision: Functional for self-feeding Self-Feeding Abilities: Needs assist;Needs set up Patient Positioning: Upright in bed Baseline Vocal Quality: Normal Volitional Cough: Cognitively unable to elicit Volitional Swallow: Unable to elicit    Oral/Motor/Sensory Function Overall Oral Motor/Sensory Function: Other (comment)(no focal weakness)   Ice Chips Ice chips: Not tested   Thin Liquid Thin Liquid: Within functional limits Presentation: Cup Pharyngeal  Phase Impairments: (intermittent  question of difficulty coordinating)    Nectar Thick Nectar Thick Liquid: Not tested  Honey Thick Honey Thick Liquid: Not tested   Puree Puree: Impaired Oral Phase Impairments: Reduced lingual movement/coordination   Solid   GO   Solid: Not tested        Royce Macadamia 08/14/2017,11:14 AM   Breck Coons Lonell Face.Ed [employer] [phone]

## 2017-08-14 NOTE — Progress Notes (Signed)
ANTICOAGULATION CONSULT NOTE  Pharmacy Consult for Heparin (Xarelto on hold) Indication: History of DVT  No Known Allergies  Patient Measurements: 66.8 kg  Vital Signs: Temp: 98.5 F (36.9 C) (03/19 1600) Temp Source: Oral (03/19 1100) BP: 88/73 (03/19 1900) Pulse Rate: 47 (03/19 1900)  Labs: Recent Labs    08/12/17 2231 08/13/17 0340 08/13/17 1534 08/14/17 0229 08/14/17 1840  HGB 12.0 8.7*  --  10.0*  --   HCT 37.0 27.5*  --  31.0*  --   PLT 414* 275  --  335  --   APTT  --   --   --  48* 65*  LABPROT 14.6  --   --   --   --   INR 1.15  --   --   --   --   HEPARINUNFRC  --   --   --  0.42  --   CREATININE 4.42* 3.20* 1.58* 1.09*  --     Estimated Creatinine Clearance: 46 mL/min (A) (by C-G formula based on SCr of 1.09 mg/dL (H)).   Assessment: 62 y/o F transfer from SNF for altered mental status. On Xarelto PTA for hx of DVT. Holding Xarelto and starting heparin during acute illness/acute kidney insufficiency.   aPTT is just below goal at 65 sec. Will increase drip rate slightly. No bleeding noted.   Goal of Therapy:  Heparin level 0.3-0.7 units/ml aPTT 66-102 seconds Monitor platelets by anticoagulation protocol: Yes   Plan:  Increase heparin drip to 1050 units/hr Daily CBC/HL/aPTT Monitor for bleeding, trend Hgb   Loura BackJennifer Alabaster, PharmD, BCPS Clinical Pharmacist Clinical phone for 08/14/2017 until 10p is [phone] After 10p, please call [facility] at [phone] for assistance 08/14/2017 7:37 PM

## 2017-08-15 DIAGNOSIS — R000 Tachycardia, unspecified: Secondary | ICD-10-CM

## 2017-08-15 DIAGNOSIS — F0391 Unspecified dementia with behavioral disturbance: Secondary | ICD-10-CM

## 2017-08-15 DIAGNOSIS — I824Y9 Acute embolism and thrombosis of unspecified deep veins of unspecified proximal lower extremity: Secondary | ICD-10-CM

## 2017-08-15 LAB — CBC
HEMATOCRIT: 30.4 % — AB (ref 36.0–46.0)
Hemoglobin: 9.5 g/dL — ABNORMAL LOW (ref 12.0–15.0)
MCH: 25.7 pg — AB (ref 26.0–34.0)
MCHC: 31.3 g/dL (ref 30.0–36.0)
MCV: 82.4 fL (ref 78.0–100.0)
Platelets: 328 10*3/uL (ref 150–400)
RBC: 3.69 MIL/uL — ABNORMAL LOW (ref 3.87–5.11)
RDW: 15.8 % — ABNORMAL HIGH (ref 11.5–15.5)
WBC: 11.8 10*3/uL — ABNORMAL HIGH (ref 4.0–10.5)

## 2017-08-15 LAB — BASIC METABOLIC PANEL
ANION GAP: 13 (ref 5–15)
BUN: 12 mg/dL (ref 6–20)
CALCIUM: 8.7 mg/dL — AB (ref 8.9–10.3)
CHLORIDE: 113 mmol/L — AB (ref 101–111)
CO2: 24 mmol/L (ref 22–32)
Creatinine, Ser: 0.88 mg/dL (ref 0.44–1.00)
GFR calc Af Amer: >60 mL/min (ref >60)
GLUCOSE: 137 mg/dL — AB (ref 65–99)
Potassium: 3.3 mmol/L — ABNORMAL LOW (ref 3.5–5.1)
Sodium: 150 mmol/L — ABNORMAL HIGH (ref 135–145)

## 2017-08-15 LAB — GLUCOSE, CAPILLARY
Glucose-Capillary: 152 mg/dL — ABNORMAL HIGH (ref 65–99)
Glucose-Capillary: 134 mg/dL — ABNORMAL HIGH (ref 65–99)
Glucose-Capillary: 137 mg/dL — ABNORMAL HIGH (ref 65–99)
Glucose-Capillary: 141 mg/dL — ABNORMAL HIGH (ref 65–99)
Glucose-Capillary: 123 mg/dL — ABNORMAL HIGH (ref 65–99)
Glucose-Capillary: 139 mg/dL — ABNORMAL HIGH (ref 65–99)

## 2017-08-15 LAB — CULTURE, BLOOD (ROUTINE X 2): SPECIAL REQUESTS: ADEQUATE

## 2017-08-15 LAB — APTT: aPTT: 73 seconds — ABNORMAL HIGH (ref 24–36)

## 2017-08-15 LAB — HEPARIN LEVEL (UNFRACTIONATED): Heparin Unfractionated: 0.66 IU/mL (ref 0.30–0.70)

## 2017-08-15 LAB — PROCALCITONIN: Procalcitonin: <0.1 ng/mL

## 2017-08-15 LAB — MAGNESIUM: MAGNESIUM: 1.6 mg/dL — AB (ref 1.7–2.4)

## 2017-08-15 LAB — PHOSPHORUS: PHOSPHORUS: 2.5 mg/dL (ref 2.5–4.6)

## 2017-08-15 MED ORDER — HYDROCORTISONE NA SUCCINATE PF 100 MG IJ SOLR
25.0000 mg | Freq: Two times a day (BID) | INTRAMUSCULAR | Status: DC
  Administered 2017-08-15 – 2017-08-17 (×4): 25 mg via INTRAVENOUS
  Filled 2017-08-15 (×4): qty 2

## 2017-08-15 MED ORDER — POTASSIUM CHLORIDE CRYS ER 20 MEQ PO TBCR: 40.0000 meq | EXTENDED_RELEASE_TABLET | Freq: Once | ORAL | Status: DC

## 2017-08-15 MED ORDER — BENZTROPINE MESYLATE 0.5 MG PO TABS
0.5000 mg | ORAL_TABLET | Freq: Every day | ORAL | Status: DC
  Administered 2017-08-15 – 2017-08-18 (×4): 0.5 mg via ORAL
  Filled 2017-08-15 (×5): qty 1

## 2017-08-15 MED ORDER — POTASSIUM CHLORIDE 20 MEQ PO PACK
40.0000 meq | PACK | Freq: Once | ORAL | Status: AC
  Administered 2017-08-15: 40 meq via ORAL
  Filled 2017-08-15: qty 2

## 2017-08-15 MED ORDER — OLANZAPINE 5 MG PO TABS
10.0000 mg | ORAL_TABLET | Freq: Every day | ORAL | Status: DC
  Administered 2017-08-15 – 2017-08-18 (×4): 10 mg via ORAL
  Filled 2017-08-15: qty 1
  Filled 2017-08-15 (×2): qty 2
  Filled 2017-08-15: qty 1

## 2017-08-15 MED ORDER — LORAZEPAM 0.5 MG PO TABS
0.5000 mg | ORAL_TABLET | Freq: Two times a day (BID) | ORAL | Status: DC
  Administered 2017-08-15 – 2017-08-19 (×9): 0.5 mg via ORAL
  Filled 2017-08-15 (×9): qty 1

## 2017-08-15 NOTE — Care Management Note (Signed)
Case Management Note Donn PieriniKristi Dyllon Henken RN, BSN Unit 4E-Case Manager-- 2H coverage [phone]  Patient Details  Name: Sarah OxfordFrieda Cogan MRN: [id number] Date of Birth: 08/29/1955  Subjective/Objective:  Pt admitted with sepsis                  Action/Plan: P[facility] from [facility]- CSW following for return to SNF when medically stable, pt has brother as contact and who checks in on her at SNF. -   Expected Discharge Date:                  Expected Discharge Plan:  Skilled Nursing Facility  In-House Referral:  Clinical Social Work  Discharge planning Services  CM Consult  Post Acute Care Choice:    Choice offered to:     DME Arranged:    DME Agency:     HH Arranged:    HH Agency:     Status of Service:  In process, will continue to follow  If discussed at Long Length of Stay Meetings, dates discussed:    Discharge Disposition: skilled facility   Additional Comments:  Darrold SpanWebster, Stephone Gum Hall, RN 08/15/2017, 11:19 AM

## 2017-08-15 NOTE — Progress Notes (Signed)
ANTICOAGULATION CONSULT NOTE  Pharmacy Consult for Heparin (Xarelto on hold) Indication: History of DVT  No Known Allergies  Patient Measurements: 66.8 kg  Vital Signs: Temp: 98.4 F (36.9 C) (03/20 0805) Temp Source: Oral (03/20 0805) BP: 141/72 (03/20 0900) Pulse Rate: 108 (03/20 0900)  Labs: Recent Labs    08/12/17 2231 08/13/17 0340 08/13/17 1534 08/14/17 0229 08/14/17 1840 08/15/17 0244  HGB 12.0 8.7*  --  10.0*  --  9.5*  HCT 37.0 27.5*  --  31.0*  --  30.4*  PLT 414* 275  --  335  --  328  APTT  --   --   --  48* 65* 73*  LABPROT 14.6  --   --   --   --   --   INR 1.15  --   --   --   --   --   HEPARINUNFRC  --   --   --  0.42  --  0.66  CREATININE 4.42* 3.20* 1.58* 1.09*  --  0.88    Estimated Creatinine Clearance: 56.9 mL/min (by C-G formula based on SCr of 0.88 mg/dL).   Assessment: 62 y/o F transfer from SNF for altered mental status. On Xarelto PTA for hx of DVT. Holding Xarelto and starting heparin during acute illness. -heparin level is at goal on 1050 units/hr  Goal of Therapy:  Heparin level 0.3-0.7 units/ml aPTT 66-102 seconds Monitor platelets by anticoagulation protocol: Yes   Plan:  -no heparin changes needed -daily heparin level and CBC  Harland GermanAndrew Kalim Kissel, PharmD Clinical Pharmacist Clinical phone from 8:30-4:00 is [phone] After 4pm, please call Main [facility] [phone]) for assistance. 08/15/2017 9:55 AM

## 2017-08-15 NOTE — Progress Notes (Addendum)
PULMONARY / CRITICAL CARE MEDICINE   Name: Sarah Mckay MRN: [id number] DOB: 08-25-1955    ADMISSION DATE:  08/12/2017 CONSULTATION DATE:  3/18/200109  REFERRING MD:  Dr. 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 Upon arrival BP 85/58. U/A positive. Crt 4.47. Patient given Vancomycin/Zosyn, 4L NS without improvement of BP and started on Levophed gtt.  Upon assessment in the ED patient systolic 130-150 on 10 mcg Levophed. Levophed titrated off. Systolic maintaining 90-100 systolic. Patient awakens to physical stimulation.    SUBJECTIVE:  Agitated overnight requiring haldol. Now calm.  Sitter at bedside. Off pressors.  Tachy 120-130  VITAL SIGNS: BP (!) 141/72   Pulse (!) 108   Temp 98.4 F (36.9 C) (Oral)   Resp 19   Ht 5\' 3"  (1.6 m)   Wt 56.3 kg (124 lb 1.9 oz)   LMP 03/25/2015 (Approximate)   SpO2 100%   BMI 21.99 kg/m   HEMODYNAMICS:    VENTILATOR SETTINGS:    INTAKE / OUTPUT: I/O last 3 completed shifts: In: 2261.3 [I.V.:1666.3; IV Piggyback:595] Out: 1420 [Urine:1420]  PHYSICAL EXAMINATION: General:  Chronically ill appearing female, NAD Neuro:  Awake in a chair but confused HEENT:  [place]/AT, PERRL, EOM-I and MMM Cardiovascular:  Tachy 120, irreg intermittently  Lungs:  resps even non labored on RA, clear  Abdomen:  Soft, NT, ND and +BS Musculoskeletal:  Scant BLE edema  Skin:  Amputation of toes of left foot, Stage 2 sacral wound    LABS:  BMET Recent Labs  Lab 08/13/17 1534 08/14/17 0229 08/15/17 0244  NA 150* 151* 150*  K 4.3 3.5 3.3*  CL 108 111 113*  CO2 27 23 24   BUN 37* 24* 12  CREATININE 1.58* 1.09* 0.88  GLUCOSE 90 106* 137*   Electrolytes Recent Labs  Lab 08/13/17 0340 08/13/17 1534 08/14/17 0229 08/15/17 0244   CALCIUM 7.6* 8.9 9.1 8.7*  MG 1.7  --  2.0 1.6*  PHOS 4.6  --  1.9* 2.5   CBC Recent Labs  Lab 08/13/17 0340 08/14/17 0229 08/15/17 0244  WBC 13.9* 13.8* 11.8*  HGB 8.7* 10.0* 9.5*  HCT 27.5* 31.0* 30.4*  PLT 275 335 328   Coag's Recent Labs  Lab 08/12/17 2231 08/14/17 0229 08/14/17 1840 08/15/17 0244  APTT  --  48* 65* 73*  INR 1.15  --   --   --    Sepsis Markers Recent Labs  Lab 08/12/17 2243 08/13/17 0055 08/13/17 0340 08/14/17 0229 08/15/17 0244  LATICACIDVEN 1.48 1.63  --   --   --   PROCALCITON  --   --  0.24 <0.10 <0.10   ABG Recent Labs  Lab 08/13/17 0530  PHART 7.344*  PCO2ART 41.9  PO2ART 39.0*   Liver Enzymes Recent Labs  Lab 08/12/17 2231  AST 26  ALT 24  ALKPHOS 92  BILITOT 0.4  ALBUMIN 3.5   Cardiac Enzymes No results for input(s): TROPONINI, PROBNP in the last 168 hours.  Glucose Recent Labs  Lab 08/14/17 1236 08/14/17 1620 08/14/17 1943 08/14/17 2306 08/15/17 0332 08/15/17 0811  GLUCAP 90 152* 81 112* 134* 137*   Imaging No results found. STUDIES:  CXR 3/17 > There is no edema or consolidation. There is minimal right  base atelectasis. Heart size and pulmonary vascularity are normal. No adenopathy. No evident bone lesions.  CULTURES: Blood 3/17 >>1/2 coag neg staph>>> Urine 3/17 >>GNR>>>  ANTIBIOTICS: Zosyn 3/17>>>3/17 Vancomycin 3/17 Rocephin 3/18 >>  SIGNIFICANT EVENTS: 3/17 > Presents to ED   LINES/TUBES: PIV   DISCUSSION: 62 year old female with ETOH related dementia presents to ED from SNF with progressive AMS. Hypotensive on arrival. Found to have UTI. Given 4L NS without improvement of BP and started on Levophed gtt. Now off pressors, ongoing agitated delirium.   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  Chronic Indwelling foley secondary to Urinary Retention  Hypokalemia  P:   Trend BMP  Replace electrolytes as indicated  Foley changed out this admit  KVO IVF    Chronic Left Upper Leg DVT  P:  Trend CBC  Heparin gtt until able to reliably take PO  Urosepsis  Stage 2 Sacral Wound  H/O E.Coli UTI - GNR in urine  P:   Trend WBC and Fever Curve Follow Culture Data  Rocephin Wound care consult Await urine culture     DM   P:   Trend glucose  SSI  Cab mod diet with dysphagia modifications per SLP   Metabolic Encephalopathy    Agitated delirium - improving.  ETOH Dementia  P:   PRN haldol Folic acid/thiamine  Sitter at bedside  Resume home zyprexa, cogentin, half dose home scheduled ativan   FAMILY  - Updates: No family bedside 3/20.  - Inter-disciplinary family meet or Palliative Care meeting due by: 08/20/2017     Dirk DressKaty Whiteheart, NP 08/15/2017  9:29 AM Pager: [phone] or [phone]  Attending Note:  62 year old female with extensive psych and dementia history presenting in septic shock that is much improved now.  On exam, remains confused but tachycardia is more under control.  I reviewed CXR myself, no acute disease noted.  Discussed with PCCM-NP.  Septic shock:  - D/C pressors.  - KVO IVF now that patient is eating  - Decrease steroid to half dose and likely d/c in AM  - Hold home anti-HTN  Sinus tach due to agitation  - Tele monitoring  - Beta blockers PRN  Agitation:  - Restart psych meds  - Monitor closely  Etoh abuse:  - Thiamine  - Folate  - MVI  DVT:  - Continue heparin  - When able to reliably take PO will switch to a NOAC  DM:  - CBG  - SSI  [facility] with PCCM off 3/21  Patient seen and examined, agree with above note.  I dictated the care and orders written for this patient under my  direction.  Alyson ReedyYacoub, Wesam G, MD [phone]

## 2017-08-15 NOTE — NC FL2 (Signed)
[place] MEDICAID FL2 LEVEL OF CARE SCREENING TOOL     IDENTIFICATION  Patient Name: Sarah OxfordFrieda Seidenberg Birthdate: 08/12/1955 Sex: female Admission Date (Current Location): 08/12/2017  [facility] [place]   Facility and Address:  [facility], [address], [place], [place] [address]      Provider Number: [id number]  Attending Physician Name and Address:  Carin HockScatliffe, Kristen D, MD  Relative Name and Phone Number:       Current Level of Care: Hospital Recommended Level of Care: Skilled Nursing Facility Prior Approval Number:    Date Approved/Denied:   PASRR Number:    Discharge Plan: SNF    Current Diagnoses: Patient Active Problem List   Diagnosis Date Noted  . Acute renal failure (HCC)   . Altered mental status   . Chronic indwelling Foley catheter 07/30/2017  . Type II diabetes mellitus with renal manifestations (HCC) 07/19/2017  . Pulmonary nodule 06/09/2017  . Neuroleptic-induced tardive dyskinesia 05/27/2017  . Urine retention 05/16/2017  . Pressure injury of skin 04/29/2017  . Vascular dementia without behavioral disturbance   . CHF (congestive heart failure) (HCC) 04/27/2017  . Acute metabolic encephalopathy 04/26/2017  . Aspiration pneumonia (HCC) 04/26/2017  . Abdominal distension 04/26/2017  . Weight loss, abnormal 02/25/2017  . Upper leg DVT (deep venous thromboembolism), chronic, left (HCC) 10/19/2016  . 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 Bilateral lower extremity edema 12/21/2015  . Sepsis (HCC) 09/16/2015  . Anxiety 09/16/2015  . Chronic constipation 08/26/2015  . Anemia 08/26/2015  . UTI (urinary tract infection) 08/26/2015  . Failure to thrive in adult 07/05/2015  . Polyneuropathy due to type 2 diabetes mellitus (HCC) 03/25/2015  . Diabetes mellitus with neurological manifestations (HCC) 10/22/2014  . Primary osteoarthritis of both knees 06/03/2014  . Chronic pain 05/13/2014  . Alcohol abuse    . 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)     Orientation RESPIRATION BLADDER Height & Weight     Self  Normal Incontinent, Indwelling catheter Weight: 124 lb 1.9 oz (56.3 kg) Height:  5\' 3"  (160 cm)  BEHAVIORAL SYMPTOMS/MOOD NEUROLOGICAL BOWEL NUTRITION STATUS      Incontinent Diet(see DC summary)  AMBULATORY STATUS COMMUNICATION OF NEEDS Skin   Extensive Assist Verbally PU Stage and Appropriate Care(unstageable on sacrum- moist to dry and foam dressing changes)                       Personal Care Assistance Level of Assistance  Bathing, Dressing Bathing Assistance: Maximum assistance   Dressing Assistance: Maximum assistance     Functional Limitations Info             SPECIAL CARE FACTORS FREQUENCY                       Contractures      Additional Factors Info  Code Status, Allergies, Insulin Sliding Scale Code Status Info: FULL Allergies Info: NKA   Insulin Sliding Scale Info: 6/day       Current Medications (08/15/2017):  This is the current hospital active medication list Current Facility-Administered Medications  Medication Dose Route Frequency Provider Last Rate Last Dose  . 0.9 %  sodium chloride infusion  250 mL Intravenous PRN Tobey GrimEubanks, Katalina M, NP 10 mL/hr at 08/15/17 0900 250 mL at 08/15/17 0900  . cefTRIAXone (ROCEPHIN) 1 g in sodium chloride 0.9 % 100 mL IVPB  1 g Intravenous Q24H Geoffery Lyonselo, Douglas, MD  Stopped at 08/15/17 0813  . collagenase (SANTYL) ointment   Topical Daily Scatliffe, Kristen D, MD      . dextrose 5 % solution   Intravenous Continuous Alyson Reedy, MD 50 mL/hr at 08/15/17 0358    . folic acid injection 1 mg  1 mg Intravenous Daily Tobey Grim, NP   1 mg at 08/14/17 0943  . haloperidol lactate (HALDOL) injection 1 mg  1 mg Intravenous Q3H PRN Karl Ito, MD   1 mg at 08/14/17 2315  . heparin ADULT infusion 100 units/mL (25000 units/258mL sodium chloride 0.45%)  1,050 Units/hr Intravenous Continuous Ilda Basset, RPH 10.5 mL/hr at 08/14/17 2000 1,050 Units/hr at 08/14/17 2000  . hydrocortisone sodium succinate (SOLU-CORTEF) 100 MG injection 25 mg  25 mg Intravenous Q6H Alyson Reedy, MD   25 mg at 08/15/17 0608  . insulin aspart (novoLOG) injection 0-15 Units  0-15 Units Subcutaneous Q4H Tobey Grim, NP   2 Units at 08/15/17 0813  . LORazepam (ATIVAN) tablet 1 mg  1 mg Oral BID PRN Alyson Reedy, MD   1 mg at 08/15/17 0808  . MEDLINE mouth rinse  15 mL Mouth Rinse BID Scatliffe, Gypsy Balsam, MD   15 mL at 08/14/17 0947  . norepinephrine (LEVOPHED) 4 mg in dextrose 5 % 250 mL (0.016 mg/mL) infusion  0-10 mcg/min Intravenous Titrated Tobey Grim, NP   Stopped at 08/14/17 0800  . thiamine (B-1) injection 100 mg  100 mg Intravenous Daily Tobey Grim, NP   100 mg at 08/14/17 9604     Discharge Medications: Please see discharge summary for a list of discharge medications.  Relevant Imaging Results:  Relevant Lab Results:   Additional Information SSN: [id number]  Burna Sis, LCSW

## 2017-08-16 ENCOUNTER — Other Ambulatory Visit: Payer: Self-pay

## 2017-08-16 DIAGNOSIS — E876 Hypokalemia: Secondary | ICD-10-CM

## 2017-08-16 DIAGNOSIS — B961 Klebsiella pneumoniae [K. pneumoniae] as the cause of diseases classified elsewhere: Secondary | ICD-10-CM

## 2017-08-16 DIAGNOSIS — B9620 Unspecified Escherichia coli [E. coli] as the cause of diseases classified elsewhere: Secondary | ICD-10-CM

## 2017-08-16 DIAGNOSIS — N390 Urinary tract infection, site not specified: Secondary | ICD-10-CM

## 2017-08-16 DIAGNOSIS — I82502 Chronic embolism and thrombosis of unspecified deep veins of left lower extremity: Secondary | ICD-10-CM

## 2017-08-16 DIAGNOSIS — I5042 Chronic combined systolic (congestive) and diastolic (congestive) heart failure: Secondary | ICD-10-CM

## 2017-08-16 DIAGNOSIS — F1027 Alcohol dependence with alcohol-induced persisting dementia: Secondary | ICD-10-CM

## 2017-08-16 DIAGNOSIS — Z9289 Personal history of other medical treatment: Secondary | ICD-10-CM

## 2017-08-16 LAB — GLUCOSE, CAPILLARY
GLUCOSE-CAPILLARY: 161 mg/dL — AB (ref 65–99)
GLUCOSE-CAPILLARY: 93 mg/dL (ref 65–99)
Glucose-Capillary: 102 mg/dL — ABNORMAL HIGH (ref 65–99)
Glucose-Capillary: 103 mg/dL — ABNORMAL HIGH (ref 65–99)
Glucose-Capillary: 124 mg/dL — ABNORMAL HIGH (ref 65–99)
Glucose-Capillary: 84 mg/dL (ref 65–99)
Glucose-Capillary: 94 mg/dL (ref 65–99)

## 2017-08-16 LAB — BASIC METABOLIC PANEL
Anion gap: 10 (ref 5–15)
BUN: 7 mg/dL (ref 6–20)
CALCIUM: 8.1 mg/dL — AB (ref 8.9–10.3)
CO2: 25 mmol/L (ref 22–32)
Chloride: 116 mmol/L — ABNORMAL HIGH (ref 101–111)
Creatinine, Ser: 0.82 mg/dL (ref 0.44–1.00)
GLUCOSE: 114 mg/dL — AB (ref 65–99)
Potassium: 3.1 mmol/L — ABNORMAL LOW (ref 3.5–5.1)
Sodium: 151 mmol/L — ABNORMAL HIGH (ref 135–145)

## 2017-08-16 LAB — CBC
HCT: 28.7 % — ABNORMAL LOW (ref 36.0–46.0)
Hemoglobin: 8.9 g/dL — ABNORMAL LOW (ref 12.0–15.0)
MCH: 25.6 pg — ABNORMAL LOW (ref 26.0–34.0)
MCHC: 31 g/dL (ref 30.0–36.0)
MCV: 82.5 fL (ref 78.0–100.0)
PLATELETS: 293 10*3/uL (ref 150–400)
RBC: 3.48 MIL/uL — ABNORMAL LOW (ref 3.87–5.11)
RDW: 15.9 % — AB (ref 11.5–15.5)
WBC: 10.7 10*3/uL — AB (ref 4.0–10.5)

## 2017-08-16 LAB — URINE CULTURE: Culture: >=100000 — AB

## 2017-08-16 LAB — HEPARIN LEVEL (UNFRACTIONATED): HEPARIN UNFRACTIONATED: 0.53 [IU]/mL (ref 0.30–0.70)

## 2017-08-16 LAB — MAGNESIUM: Magnesium: 1.5 mg/dL — ABNORMAL LOW (ref 1.7–2.4)

## 2017-08-16 LAB — POTASSIUM: POTASSIUM: 3.5 mmol/L (ref 3.5–5.1)

## 2017-08-16 MED ORDER — POTASSIUM CHLORIDE CRYS ER 20 MEQ PO TBCR
50.0000 meq | EXTENDED_RELEASE_TABLET | Freq: Once | ORAL | Status: AC
  Administered 2017-08-16: 09:00:00 50 meq via ORAL
  Filled 2017-08-16: qty 1

## 2017-08-16 MED ORDER — RIVAROXABAN 20 MG PO TABS
20.0000 mg | ORAL_TABLET | Freq: Every day | ORAL | Status: DC
  Administered 2017-08-16 – 2017-08-18 (×3): 20 mg via ORAL
  Filled 2017-08-16 (×4): qty 1

## 2017-08-16 MED ORDER — MAGNESIUM SULFATE 2 GM/50ML IV SOLN
2.0000 g | Freq: Once | INTRAVENOUS | Status: AC
  Administered 2017-08-16: 2 g via INTRAVENOUS
  Filled 2017-08-16: qty 50

## 2017-08-16 MED ORDER — POTASSIUM CHLORIDE CRYS ER 20 MEQ PO TBCR
50.0000 meq | EXTENDED_RELEASE_TABLET | Freq: Once | ORAL | Status: AC
  Administered 2017-08-16: 50 meq via ORAL
  Filled 2017-08-16: qty 1

## 2017-08-16 NOTE — Plan of Care (Signed)
  Problem: Clinical Measurements: Goal: Ability to maintain clinical measurements within normal limits will improve Outcome: Progressing Goal: Will remain free from infection Outcome: Progressing Goal: Diagnostic test results will improve Outcome: Progressing Goal: Cardiovascular complication will be avoided Outcome: Progressing   Problem: Coping: Goal: Level of anxiety will decrease Outcome: Progressing   Problem: Elimination: Goal: Will not experience complications related to bowel motility Outcome: Progressing Goal: Will not experience complications related to urinary retention Outcome: Progressing   Problem: Pain Managment: Goal: General experience of comfort will improve Outcome: Progressing   Problem: Safety: Goal: Ability to remain free from injury will improve Outcome: Progressing   Problem: Education: Goal: Knowledge of General Education information will improve Outcome: Not Progressing   Problem: Health Behavior/Discharge Planning: Goal: Ability to manage health-related needs will improve Outcome: Not Progressing   Problem: Activity: Goal: Risk for activity intolerance will decrease Outcome: Not Progressing   Problem: Nutrition: Goal: Adequate nutrition will be maintained Outcome: Not Progressing   Problem: Skin Integrity: Goal: Risk for impaired skin integrity will decrease Outcome: Not Progressing

## 2017-08-16 NOTE — Progress Notes (Signed)
  Speech Language Pathology Treatment: Dysphagia  Patient Details Name: Sarah OxfordFrieda Mckay MRN: [id number] DOB: 12/23/1955 Today's Date: 08/16/2017 Time: [id number] SLP Time Calculation (min) (ACUTE ONLY): 10 min  Assessment / Plan / Recommendation Clinical Impression  Pt calmer with improved attention today; sitting in chair. Mild lingual residue and increased manipulation needed with trial of upgraded D3 texture. No s/s aspiration with thin or solid. Recommend pt continue puree texture at this time, thin liquids, full supervision and continued ST.    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 Found to have spesis, UTI, CXR minimal right base atelectasis. No edema or consolidation. No adenopathy. Cardiac silhouette within normal limits. BSE 04/27/17 functional oropharyngeal swallow, decreased mastication, endentulous, Dys 3/thin.      SLP Plan  Continue with current plan of care       Recommendations  Diet recommendations: Dysphagia 1 (puree);Thin liquid Liquids provided via: Cup;Straw Medication Administration: Crushed with puree Supervision: Patient able to self feed;Full supervision/cueing for compensatory strategies Compensations: Slow rate;Minimize environmental distractions;Small sips/bites Postural Changes and/or Swallow Maneuvers: Seated upright 90 degrees                Oral Care Recommendations: Oral care BID Follow up Recommendations: None SLP Visit Diagnosis: Dysphagia, unspecified (R13.10) Plan: Continue with current plan of care                      Royce MacadamiaLitaker, Kariem Wolfson Willis 08/16/2017, 12:46 PM   Breck CoonsLisa Willis Lonell FaceLitaker M.Ed [employer]-SLP Pager [phone]

## 2017-08-16 NOTE — Progress Notes (Signed)
ANTICOAGULATION CONSULT NOTE  Pharmacy Consult for Heparin (Xarelto on hold) Indication: History of DVT  No Known Allergies  Patient Measurements: 66.8 kg  Vital Signs: Temp: 98.5 F (36.9 C) (03/21 0743) Temp Source: Oral (03/21 0743) BP: 127/93 (03/21 0700) Pulse Rate: 95 (03/21 0700)  Labs: Recent Labs    08/14/17 0229 08/14/17 1840 08/15/17 0244 08/16/17 0242  HGB 10.0*  --  9.5* 8.9*  HCT 31.0*  --  30.4* 28.7*  PLT 335  --  328 293  APTT 48* 65* 73*  --   HEPARINUNFRC 0.42  --  0.66 0.53  CREATININE 1.09*  --  0.88 0.82    Estimated Creatinine Clearance: 61.1 mL/min (by C-G formula based on SCr of 0.82 mg/dL).   Assessment: 62 y/o F transfer from SNF for altered mental status. On Xarelto PTA for hx of DVT. Holding Xarelto and starting heparin during acute illness. She is now tolerating po meds -heparin level is at goal on 1050 units/hr  Goal of Therapy:  Heparin level 0.3-0.7 units/ml aPTT 66-102 seconds Monitor platelets by anticoagulation protocol: Yes   Plan:  -no heparin changes needed -daily heparin level and CBC -Consider restarting Xarelto  Harland GermanAndrew Jeyden Coffelt, PharmD Clinical Pharmacist Clinical phone from 8:30-4:00 is [phone] After 4pm, please call Main Rx [phone]) for assistance. 08/16/2017 8:34 AM

## 2017-08-16 NOTE — Progress Notes (Signed)
Physical Therapy Treatment Patient Details Name: Sarah Mckay MRN: [id number] DOB: 03/21/1956 Today's Date: 08/16/2017    History of Present Illness Patient is a 62 y.o. 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 Found to have spesis, UTI; also with AKI and chronic LLE DVT. CXR shows minimal right base atelectasis.     PT Comments    Pt performed multiple short sit to stand trials with decreased extension into full standing.  Pt continues to present with cognitive deficits but was able to follow commands more appropriately during session.  Pt performed seated UE/LE exercises and noticeable fatigue observed.  Pt continues to benefit from SNF placement for continued rehab post hospitalization.    Follow Up Recommendations  SNF;Supervision/Assistance - 24 hour     Equipment Recommendations  None recommended by PT    Recommendations for Other Services       Precautions / Restrictions Precautions Precautions: Fall Restrictions Weight Bearing Restrictions: No    Mobility  Bed Mobility Overal bed mobility: Needs Assistance             General bed mobility comments: +2 max assist to scoot back in recliner with use of chux pad.    Transfers Overall transfer level: Needs assistance Equipment used: (sara stedy utilized) Transfers: Sit to/from [employer] AirlinesStand           General transfer comment: Pt able to follow commands for hand and foot placement and performed multiple reps of static standing with B UE support.  Pt required cues for upper trunk control, hip extension and forward gaze.  With fatigue she presents with forward flexed posturing.    Ambulation/Gait Ambulation/Gait assistance: (NT standing tolerance remains inadequate to progress to gt safley at this time.  )               Stairs             Wheelchair Mobility    Modified Rankin (Stroke Patients Only)       Balance Overall balance assessment: Needs assistance   Sitting balance-Leahy Scale: Poor       Standing balance-Leahy Scale: Poor                              Cognition Arousal/Alertness: Awake/alert Behavior During Therapy: Impulsive;Restless Overall Cognitive Status: History of cognitive impairments - at baseline                                 General Comments: Pt remains to perseverate on repeating words you speak to her.  She was able to follow commands relatively well.  She did report she needed to be saved because she thought the sitter was trying to kill her.  Reoriented patient and she is easy to redirect.  She was able to tell me she was in the hopsital because she had fallen.        Exercises General Exercises - Lower Extremity Long Arc Quad: AROM;AAROM;Both;10 reps;Seated Hip Flexion/Marching: AROM;AAROM;Both;10 reps;Seated Shoulder Exercises Shoulder Flexion: AROM;Both;10 reps;Seated Shoulder Extension: AROM;Both;10 reps;Seated Shoulder ABduction: AROM;10 reps;Seated;Both    General Comments        Pertinent Vitals/Pain Pain Assessment: No/denies pain    Home Living  Prior Function            PT Goals (current goals can now be found in the care plan section) Acute Rehab PT Goals Patient Stated Goal: None stated Time For Goal Achievement: 08/28/17 Progress towards PT goals: Progressing toward goals    Frequency           PT Plan Current plan remains appropriate    Co-evaluation              AM-PAC PT "6 Clicks" Daily Activity  Outcome Measure  Difficulty turning over in bed (including adjusting bedclothes, sheets and blankets)?: Unable Difficulty moving from lying on back to sitting on the side of the bed? : Unable Difficulty sitting down on and standing up from a chair with arms (e.g., wheelchair,  bedside commode, etc,.)?: Unable Help needed moving to and from a bed to chair (including a wheelchair)?: A Lot Help needed walking in hospital room?: Total Help needed climbing 3-5 steps with a railing? : Total 6 Click Score: 7    End of Session Equipment Utilized During Treatment: Gait belt Activity Tolerance: Patient limited by fatigue Patient left: in chair;with call bell/phone within reach;with nursing/sitter in room(Pt with 1:1 sitter so alarm not set.  ) Nurse Communication: Mobility status PT Visit Diagnosis: Other abnormalities of gait and mobility (R26.89);Muscle weakness (generalized) (M62.81)     Time: 1040-1057 PT Time Calculation (min) (ACUTE ONLY): 17 min  Charges:  $Therapeutic Activity: 8-22 mins                    G CodesJoycelyn Rua, PTA pager [phone]    Florestine Avers 08/16/2017, 11:15 AM

## 2017-08-16 NOTE — Progress Notes (Signed)
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 Upon arrival BP 85/58. U/A positive. Crt 4.47. Patient given Vancomycin/Zosyn, 4L NS without improvement of BP and started on Levophed gtt.   Upon assessment in the ED patient systolic 130-150 on 10 mcg Levophed. Levophed titrated off. Systolic maintaining 90-100 systolic. Patient awakens to physical stimulation.      Subjective: 3/21/O x1 (does not know where, why, when) pleasantly confused.  Follows commands.  Negative CP.  Negative S OB.  Negative abdominal pain.  Easily redirectable.   Assessment & Plan:   Active Problems:   Sepsis (HCC)   Septic Shock/Urosepsispositive E. coli, Klebsiella pneumoniae -Complete 5-day course of antibiotics  Sacral decubitus ulcer stage II   -Care per wound care  Chronic Systolic and Diastolic CHF  -(EF 20-25)  -Hydrocortisone 25 mg  BID we will taper slowly  -Ambulate as able, not sure what the baseline is -Strict in and out -Daily weight -Transfuse for hemoglobin<8  Hypokalemia  -Potassium goal>4 -K-Dur 50 mEq -Repeat K/Mg @ 1500; potassium still low repeat K-Dur 50 mg  Hypomagnesemia -Magnesium goal> 2 -Magnesium IV 2 g   Acute kidney injury (baseline Cr 0.6-1) Recent Labs  Lab 08/12/17 2231 08/13/17 0340 08/13/17 1534 08/14/17 0229 08/15/17 0244 08/16/17 0242  CREATININE 4.42* 3.20* 1.58* 1.09* 0.88 0.82  -At baseline -Avoid nephrotoxic medication  Chronic indwelling Foley catheter secondary to urinary retention -Last time Foley change?  Will change after completion of course of antibiotics.     Chronic Left Upper Leg DVT  -Heparin drip--> Xarelto  Diabetes type 2  uncontrolled with  complication -2/5 hemoglobin A1c= 7.2 - Moderate SSI -Lipid panel within ADA guidelines  Metabolic Encephalopathy/EtOH Dementia -Unsure of baseline.  Patient has ankle monitor from her SNF which usually is reserved for people wonder. -Minimize sedating medication - Folic acid 1 mg daily -Thiamine 100 mg daily  Goals of care - 3/21 PALLIATIVE CARE: Patient with what appears to be serious dementia, and other comorbidities already institutionalized evaluate for change of CODE STATUS to DNR, short-term vs long-term goals of care      DVT prophylaxis: Heparin drip--> Xarelto Code Status: Full Family Communication: None Disposition Plan: TPD   Consultants:  PCCM   Procedures/Significant Events:  04/28/2017 echocardiogram: LVEF= 20% to 25%. Severe diffuse hypokinesis with regional variations. Possible  disproportionately severe hypokinesis of the inferolateral, inferior, and inferoseptal myocardium. Features are consistent -Grade 2 diastolic dysfunction.   ---------------------------------------------------------------------------------------------------------------------------------------- CXR 3/17 > There is no edema or consolidation. There is minimal right base atelectasis. Heart size and pulmonary vascularity are normal. No adenopathy. No evident bone lesions.     I have personally reviewed and interpreted all radiology studies and my findings are as above.  VENTILATOR SETTINGS:    Cultures 3/17 blood positive 1/2 coag negative staph most likely contaminant 3/17 urine positive E. coli, Klebsiella pneumoniae 3/18 MRSA by PCR negative     Antimicrobials: Anti-infectives (From admission, onward)   Start     Stop   08/14/17 2200  vancomycin (VANCOCIN) IVPB 750 mg/150 ml premix  Status:  Discontinued     08/14/17 1005   08/13/17 0800  cefTRIAXone (ROCEPHIN) 1 g in  sodium chloride 0.9 % 100 mL IVPB         08/13/17 0045  vancomycin (VANCOCIN) IVPB 1000 mg/200 mL premix      08/13/17 0201   08/13/17 0045  piperacillin-tazobactam (ZOSYN) IVPB 3.375 g     08/13/17 0140       Devices    LINES / TUBES:      Continuous Infusions: . sodium chloride Stopped (08/15/17 1200)  . cefTRIAXone (ROCEPHIN)  IV Stopped (08/15/17 0813)  . heparin 1,050 Units/hr (08/16/17 0600)     Objective: Vitals:   08/16/17 0400 08/16/17 0555 08/16/17 0600 08/16/17 0700  BP: (!) 111/51  126/61 (!) 127/93  Pulse: (!) 55  92 95  Resp: 12  (!) 29 (!) 27  Temp:      TempSrc:      SpO2: 100%  100% 100%  Weight:  123 lb 3.8 oz (55.9 kg)    Height:        Intake/Output Summary (Last 24 hours) at 08/16/2017 0741 Last data filed at 08/16/2017 0600 Gross per 24 hour  Intake 732 ml  Output 540 ml  Net 192 ml   Filed Weights   08/14/17 0500 08/15/17 0400 08/16/17 0555  Weight: 127 lb 6.8 oz (57.8 kg) 124 lb 1.9 oz (56.3 kg) 123 lb 3.8 oz (55.9 kg)    Examination:  General: A/O x1 (does not know where, when, why) pleasantly confused, easily redirectable, no acute respiratory distress Neck:  Negative scars, masses, torticollis, lymphadenopathy, JVD Lungs: Clear to auscultation bilaterally without wheezes or crackles Cardiovascular: Regular rate and rhythm without murmur gallop or rub normal S1 and S2 Abdomen: negative abdominal pain, nondistended, positive soft, bowel sounds, no rebound, no ascites, no appreciable mass Extremities: No significant cyanosis, clubbing, or edema bilateral lower extremities Skin: Negative rashes, lesions, ulcers, stage II sacral decubitus ulcer Psychiatric: Dementia, unknown baseline  Central nervous system:  Cranial nerves II through XII intact, tongue/uvula midline, moves all extremities to command, all extremities muscle strength 3-4 /5, sensation intact throughout,  negative dysarthria, negative expressive aphasia, negative receptive aphasia.  .     Data Reviewed: Care during the described time interval was provided by me .  I have  reviewed this patient's available data, including medical history, events of note, physical examination, and all test results as part of my evaluation.   CBC: Recent Labs  Lab 08/12/17 2231 08/13/17 0340 08/14/17 0229 08/15/17 0244 08/16/17 0242  WBC 18.9* 13.9* 13.8* 11.8* 10.7*  NEUTROABS 13.7*  --   --   --   --   HGB 12.0 8.7* 10.0* 9.5* 8.9*  HCT 37.0 27.5* 31.0* 30.4* 28.7*  MCV 82.8 82.3 82.9 82.4 82.5  PLT 414* 275 335 328 293   Basic Metabolic Panel: Recent Labs  Lab 08/13/17 0340 08/13/17 1534 08/14/17 0229 08/15/17 0244 08/16/17 0242  NA 143 150* 151* 150* 151*  K 4.0 4.3 3.5 3.3* 3.1*  CL 112* 108 111 113* 116*  CO2 22 27 23 24 25   GLUCOSE 124* 90 106* 137* 114*  BUN 64* 37* 24* 12 7  CREATININE 3.20* 1.58* 1.09* 0.88 0.82  CALCIUM 7.6* 8.9 9.1 8.7* 8.1*  MG 1.7  --  2.0 1.6*  --   PHOS 4.6  --  1.9* 2.5  --    GFR: Estimated Creatinine Clearance: 61.1 mL/min (by C-G formula based on SCr of 0.82 mg/dL). Liver Function Tests: Recent Labs  Lab 08/12/17 2231  AST 26  ALT 24  ALKPHOS 92  BILITOT 0.4  PROT 9.4*  ALBUMIN 3.5   No results for input(s): LIPASE, AMYLASE in the last 168 hours. No results for input(s): AMMONIA in the last 168 hours. Coagulation Profile: Recent Labs  Lab 08/12/17 2231  INR 1.15   Cardiac Enzymes: No results for input(s): CKTOTAL, CKMB, CKMBINDEX, TROPONINI in the last 168 hours. BNP (last 3 results) No results for input(s): PROBNP in the last 8760 hours. HbA1C: No results for input(s): HGBA1C in the last 72 hours. CBG: Recent Labs  Lab 08/15/17 0811 08/15/17 1157 08/15/17 1610 08/15/17 1942 08/15/17 2357  GLUCAP 137* 141* 123* 139* 94   Lipid Profile: No results for input(s): CHOL, HDL, LDLCALC, TRIG, CHOLHDL, LDLDIRECT in the last 72 hours. Thyroid Function Tests: No results for input(s): TSH, T4TOTAL, FREET4, T3FREE, THYROIDAB in the last 72 hours. Anemia Panel: No results for input(s): VITAMINB12,  FOLATE, FERRITIN, TIBC, IRON, RETICCTPCT in the last 72 hours. Urine analysis:    Component Value Date/Time   COLORURINE AMBER (A) 08/12/2017 2249   APPEARANCEUR CLOUDY (A) 08/12/2017 2249   LABSPEC 1.018 08/12/2017 2249   PHURINE 5.0 08/12/2017 2249   GLUCOSEU NEGATIVE 08/12/2017 2249   HGBUR LARGE (A) 08/12/2017 2249   BILIRUBINUR SMALL (A) 08/12/2017 2249   KETONESUR NEGATIVE 08/12/2017 2249   PROTEINUR >=300 (A) 08/12/2017 2249   NITRITE NEGATIVE 08/12/2017 2249   LEUKOCYTESUR LARGE (A) 08/12/2017 2249   Sepsis Labs: @LABRCNTIP (procalcitonin:4,lacticidven:4)  ) Recent Results (from the past 240 hour(s))  Culture, blood (Routine x 2)     Status: Abnormal   Collection Time: 08/12/17 10:30 PM  Result Value Ref Range Status   Specimen Description BLOOD LEFT ANTECUBITAL  Final   Special Requests   Final    BOTTLES DRAWN AEROBIC AND ANAEROBIC Blood Culture adequate volume   Culture  Setup Time   Final    BLOOD AEROBIC BOTTLE GRAM POSITIVE COCCI IN CLUSTERS CRITICAL RESULT CALLED TO, READ BACK BY AND VERIFIED WITH: J LEDFORD PHARMD 08/14/17 0036 JDW    Culture (A)  Final    STAPHYLOCOCCUS SPECIES (COAGULASE NEGATIVE) THE SIGNIFICANCE OF ISOLATING THIS ORGANISM FROM A SINGLE SET OF BLOOD CULTURES WHEN MULTIPLE SETS ARE DRAWN IS UNCERTAIN. PLEASE NOTIFY THE MICROBIOLOGY DEPARTMENT WITHIN ONE WEEK IF SPECIATION AND SENSITIVITIES ARE REQUIRED. Performed at [facility], [address], [place], [place] [address]    Report Status 08/15/2017 FINAL  Final  Blood Culture ID Panel (Reflexed)     Status: Abnormal   Collection Time: 08/12/17 10:30 PM  Result Value Ref Range Status   Enterococcus species NOT DETECTED NOT DETECTED Final   Listeria monocytogenes NOT DETECTED NOT DETECTED Final   Staphylococcus species DETECTED (A) NOT DETECTED Final    Comment: Methicillin (oxacillin) susceptible coagulase negative staphylococcus. Possible blood culture contaminant (unless isolated  from more than one blood culture draw or clinical case suggests pathogenicity). No antibiotic treatment is indicated for blood  culture contaminants. CRITICAL RESULT CALLED TO, READ BACK BY AND VERIFIED WITH: J LEDFORD PHARMD 08/14/17 0036 JDW    Staphylococcus aureus NOT DETECTED NOT DETECTED Final   Methicillin resistance NOT DETECTED NOT DETECTED Final   Streptococcus species NOT DETECTED NOT DETECTED Final   Streptococcus agalactiae NOT DETECTED NOT DETECTED Final   Streptococcus pneumoniae NOT DETECTED NOT DETECTED Final   Streptococcus pyogenes NOT DETECTED NOT DETECTED Final   Acinetobacter baumannii NOT DETECTED NOT DETECTED Final   Enterobacteriaceae species NOT DETECTED NOT DETECTED Final   Enterobacter cloacae  complex NOT DETECTED NOT DETECTED Final   Escherichia coli NOT DETECTED NOT DETECTED Final   Klebsiella oxytoca NOT DETECTED NOT DETECTED Final   Klebsiella pneumoniae NOT DETECTED NOT DETECTED Final   Proteus species NOT DETECTED NOT DETECTED Final   Serratia marcescens NOT DETECTED NOT DETECTED Final   Haemophilus influenzae NOT DETECTED NOT DETECTED Final   Neisseria meningitidis NOT DETECTED NOT DETECTED Final   Pseudomonas aeruginosa NOT DETECTED NOT DETECTED Final   Candida albicans NOT DETECTED NOT DETECTED Final   Candida glabrata NOT DETECTED NOT DETECTED Final   Candida krusei NOT DETECTED NOT DETECTED Final   Candida parapsilosis NOT DETECTED NOT DETECTED Final   Candida tropicalis NOT DETECTED NOT DETECTED Final    Comment: Performed at [facility], [address] [place], [place] [address]  Urine culture     Status: Abnormal (Preliminary result)   Collection Time: 08/12/17 10:49 PM  Result Value Ref Range Status   Specimen Description URINE, CATHETERIZED  Final   Special Requests NONE  Final   Culture (A)  Final    >=100,000 COLONIES/mL KLEBSIELLA PNEUMONIAE >=100,000 COLONIES/mL ESCHERICHIA COLI SUSCEPTIBILITIES TO FOLLOW Performed at [facility], [address] [place], [place] [address]    Report Status PENDING  Incomplete  Culture, blood (Routine x 2)     Status: None (Preliminary result)   Collection Time: 08/12/17 11:51 PM  Result Value Ref Range Status   Specimen Description BLOOD RIGHT ANTECUBITAL  Final   Special Requests IN PEDIATRIC BOTTLE Blood Culture adequate volume  Final   Culture   Final    NO GROWTH 2 DAYS Performed at [facility], [address] [place], [place] [address]    Report Status PENDING  Incomplete  MRSA PCR Screening     Status: None   Collection Time: 08/13/17  6:42 AM  Result Value Ref Range Status   MRSA by PCR NEGATIVE NEGATIVE Final    Comment:        The GeneXpert MRSA Assay (FDA approved for NASAL specimens only), is one component of a comprehensive MRSA colonization surveillance program. It is not intended to diagnose MRSA infection nor to guide or monitor treatment for MRSA infections. Performed at [facility], [address] [place], [place] [address]          Radiology Studies: No results found.      Scheduled Meds: . benztropine  0.5 mg Oral QHS  . collagenase   Topical Daily  . folic acid  1 mg Intravenous Daily  . hydrocortisone sod succinate (SOLU-CORTEF) inj  25 mg Intravenous Q12H  . insulin aspart  0-15 Units Subcutaneous Q4H  . LORazepam  0.5 mg Oral BID  . mouth rinse  15 mL Mouth Rinse BID  . OLANZapine  10 mg Oral QHS  . thiamine injection  100 mg Intravenous Daily   Continuous Infusions: . sodium chloride Stopped (08/15/17 1200)  . cefTRIAXone (ROCEPHIN)  IV Stopped (08/15/17 0813)  . heparin 1,050 Units/hr (08/16/17 0600)     LOS: 3 days    Time spent: 40 minutes    Alyissa Whidbee, Roselind MessierURTIS J, MD [facility] Pager [phone]   If 7PM-7AM, please contact night-coverage [email or web address] Password [email or web address] 08/16/2017, 7:41 AM

## 2017-08-17 DIAGNOSIS — T83511A Infection and inflammatory reaction due to indwelling urethral catheter, initial encounter: Secondary | ICD-10-CM

## 2017-08-17 DIAGNOSIS — A4151 Sepsis due to Escherichia coli [E. coli]: Secondary | ICD-10-CM

## 2017-08-17 DIAGNOSIS — Z515 Encounter for palliative care: Secondary | ICD-10-CM

## 2017-08-17 LAB — BASIC METABOLIC PANEL
ANION GAP: 11 (ref 5–15)
CALCIUM: 8.7 mg/dL — AB (ref 8.9–10.3)
CO2: 22 mmol/L (ref 22–32)
Chloride: 113 mmol/L — ABNORMAL HIGH (ref 101–111)
Creatinine, Ser: 0.79 mg/dL (ref 0.44–1.00)
GFR calc Af Amer: >60 mL/min (ref >60)
GLUCOSE: 112 mg/dL — AB (ref 65–99)
POTASSIUM: 3.8 mmol/L (ref 3.5–5.1)
SODIUM: 146 mmol/L — AB (ref 135–145)

## 2017-08-17 LAB — GLUCOSE, CAPILLARY
GLUCOSE-CAPILLARY: 106 mg/dL — AB (ref 65–99)
GLUCOSE-CAPILLARY: 88 mg/dL (ref 65–99)
GLUCOSE-CAPILLARY: 93 mg/dL (ref 65–99)
GLUCOSE-CAPILLARY: 98 mg/dL (ref 65–99)
Glucose-Capillary: 105 mg/dL — ABNORMAL HIGH (ref 65–99)
Glucose-Capillary: 117 mg/dL — ABNORMAL HIGH (ref 65–99)

## 2017-08-17 LAB — CBC
HEMATOCRIT: 30.6 % — AB (ref 36.0–46.0)
HEMOGLOBIN: 9.5 g/dL — AB (ref 12.0–15.0)
MCH: 25.5 pg — ABNORMAL LOW (ref 26.0–34.0)
MCHC: 31 g/dL (ref 30.0–36.0)
MCV: 82.3 fL (ref 78.0–100.0)
Platelets: 281 10*3/uL (ref 150–400)
RBC: 3.72 MIL/uL — ABNORMAL LOW (ref 3.87–5.11)
RDW: 16.1 % — AB (ref 11.5–15.5)
WBC: 9 10*3/uL (ref 4.0–10.5)

## 2017-08-17 LAB — MAGNESIUM: MAGNESIUM: 2.2 mg/dL (ref 1.7–2.4)

## 2017-08-17 MED ORDER — FOLIC ACID 1 MG PO TABS
1.0000 mg | ORAL_TABLET | Freq: Every day | ORAL | Status: DC
  Administered 2017-08-18 – 2017-08-19 (×2): 1 mg via ORAL
  Filled 2017-08-17 (×2): qty 1

## 2017-08-17 MED ORDER — TORSEMIDE 20 MG PO TABS
20.0000 mg | ORAL_TABLET | Freq: Every day | ORAL | Status: DC
  Administered 2017-08-17 – 2017-08-19 (×3): 20 mg via ORAL
  Filled 2017-08-17 (×3): qty 1

## 2017-08-17 MED ORDER — VITAMIN B-1 100 MG PO TABS
100.0000 mg | ORAL_TABLET | Freq: Every day | ORAL | Status: DC
  Administered 2017-08-18 – 2017-08-19 (×2): 100 mg via ORAL
  Filled 2017-08-17 (×2): qty 1

## 2017-08-17 MED ORDER — HYDROCORTISONE NA SUCCINATE PF 100 MG IJ SOLR
25.0000 mg | Freq: Every day | INTRAMUSCULAR | Status: DC
  Administered 2017-08-18: 25 mg via INTRAVENOUS
  Filled 2017-08-17: qty 2

## 2017-08-17 NOTE — Progress Notes (Signed)
PROGRESS NOTE    Sarah OxfordFrieda Proano  ZOX:[id number] DOB: 09/15/1955 DOA: 08/12/2017 PCP: Kirt Boysarter, Monica, DO   Brief Narrative: Sarah Mckay is a 62 y.o. female with a history of ethanol abuse, dementia, DM2, chronic foley for urinary retention, chronic left leg DVT. She presented with altered mental status and hypotension requiring vasopressor support. She was found to have a UTI with negative blood culture to date. Initially treated with Vancomycin/Zosyn and transitioned to ceftriaxone. She has improved mentally.   Assessment & Plan:   Active Problems:   Sepsis (HCC)   Septic shock Sepsis secondary to E. Coli/kebsiella pneumoniae UTI E. Coli/kebsiella pneumoniae UTI Sepsis resolved. Stable on antibiotics. Blood cultures no growth to date. -Continue Ceftriaxone -Wean down solu-cortef, watch blood pressure  Sacral decubitus ulcer Stage 2 -Wound care  Chronic combined systolic and diastolic heart failure Last EF of 20-25% with diffuse hypokinesis and grade 2 diastolic dysfunction. Currently stable. -Restart home torsemide -Continue to hold lisinopril in setting of sepsis on presentation -Daily weight and in/out  Acute kidney injury Secondary to septic shock. Resolved.  Hypomagnesemia Hypokalemia Supplementation given. Resolved.  Urinary retention Chronic foley Plan to change foley after completion of antibiotics. Unknown when last change occurred.  Chronic left upper leg DVT Patient on Xarelto as an outpatient. -Continue Xarelto  Diabetes mellitus, type 2 -Continue SSI  Metabolic encephalopathy Ethanol dementia Unknown baseline. [facility] Appears stable.   DVT prophylaxis: Xarelto Code Status:   Code Status: Full Code Family Communication: None at bedside Disposition Plan: Discharge to SNF when bed available and when weaned off of solu-cortef/UTI treatment completed   Consultants:   Critical care medicine  Palliative care  medicine  Procedures:   None  Antimicrobials:  Vancomycin  Zosyn  Ceftriaxone    Subjective: Patient reports wanting to get up  Objective: Vitals:   08/16/17 2200 08/16/17 2254 08/17/17 0500 08/17/17 0503  BP: 120/61 (!) 115/53  (!) 109/50  Pulse: 85 83  67  Resp: (!) 22   20  Temp:  99.6 F (37.6 C)  98.1 F (36.7 C)  TempSrc:  Oral  Oral  SpO2: 100% 98%  100%  Weight:   55.9 kg (123 lb 3.8 oz)   Height:        Intake/Output Summary (Last 24 hours) at 08/17/2017 1051 Last data filed at 08/17/2017 0546 Gross per 24 hour  Intake 593 ml  Output 775 ml  Net -182 ml   Filed Weights   08/15/17 0400 08/16/17 0555 08/17/17 0500  Weight: 56.3 kg (124 lb 1.9 oz) 55.9 kg (123 lb 3.8 oz) 55.9 kg (123 lb 3.8 oz)    Examination:  General exam: Appears calm and comfortable Respiratory system: Rales heard on right base. Otherwise, clear to auscultation with no wheezing or increased effort. Cardiovascular system: S1 & S2 heard, Tachycardia, regular rhythm. No murmurs, rubs, gallops or clicks. Gastrointestinal system: Abdomen is nondistended, soft and nontender. No organomegaly or masses felt. Normal bowel sounds heard. Central nervous system: Alert and oriented to person and place (hospital). No focal neurological deficits. Extremities: No edema. No calf tenderness Skin: No cyanosis. No rashes Psychiatry: Judgement and insight appear impaired. Flat affect    Data Reviewed: I have personally reviewed following labs and imaging studies  CBC: Recent Labs  Lab 08/12/17 2231 08/13/17 0340 08/14/17 0229 08/15/17 0244 08/16/17 0242 08/17/17 0657  WBC 18.9* 13.9* 13.8* 11.8* 10.7* 9.0  NEUTROABS 13.7*  --   --   --   --   --  HGB 12.0 8.7* 10.0* 9.5* 8.9* 9.5*  HCT 37.0 27.5* 31.0* 30.4* 28.7* 30.6*  MCV 82.8 82.3 82.9 82.4 82.5 82.3  PLT 414* 275 335 328 293 281   Basic Metabolic Panel: Recent Labs  Lab 08/13/17 0340 08/13/17 1534 08/14/17 0229 08/15/17 0244  08/16/17 0242 08/16/17 1434 08/17/17 0657  NA 143 150* 151* 150* 151*  --  146*  K 4.0 4.3 3.5 3.3* 3.1* 3.5 3.8  CL 112* 108 111 113* 116*  --  113*  CO2 22 27 23 24 25   --  22  GLUCOSE 124* 90 106* 137* 114*  --  112*  BUN 64* 37* 24* 12 7  --  <5*  CREATININE 3.20* 1.58* 1.09* 0.88 0.82  --  0.79  CALCIUM 7.6* 8.9 9.1 8.7* 8.1*  --  8.7*  MG 1.7  --  2.0 1.6*  --  1.5* 2.2  PHOS 4.6  --  1.9* 2.5  --   --   --    GFR: Estimated Creatinine Clearance: 62.6 mL/min (by C-G formula based on SCr of 0.79 mg/dL). Liver Function Tests: Recent Labs  Lab 08/12/17 2231  AST 26  ALT 24  ALKPHOS 92  BILITOT 0.4  PROT 9.4*  ALBUMIN 3.5   No results for input(s): LIPASE, AMYLASE in the last 168 hours. No results for input(s): AMMONIA in the last 168 hours. Coagulation Profile: Recent Labs  Lab 08/12/17 2231  INR 1.15   Cardiac Enzymes: No results for input(s): CKTOTAL, CKMB, CKMBINDEX, TROPONINI in the last 168 hours. BNP (last 3 results) No results for input(s): PROBNP in the last 8760 hours. HbA1C: No results for input(s): HGBA1C in the last 72 hours. CBG: Recent Labs  Lab 08/16/17 2045 08/16/17 2255 08/16/17 2358 08/17/17 0451 08/17/17 0725  GLUCAP 161* 93 88 93 98   Lipid Profile: No results for input(s): CHOL, HDL, LDLCALC, TRIG, CHOLHDL, LDLDIRECT in the last 72 hours. Thyroid Function Tests: No results for input(s): TSH, T4TOTAL, FREET4, T3FREE, THYROIDAB in the last 72 hours. Anemia Panel: No results for input(s): VITAMINB12, FOLATE, FERRITIN, TIBC, IRON, RETICCTPCT in the last 72 hours. Sepsis Labs: Recent Labs  Lab 08/12/17 2243 08/13/17 0055 08/13/17 0340 08/14/17 0229 08/15/17 0244  PROCALCITON  --   --  0.24 <0.10 <0.10  LATICACIDVEN 1.48 1.63  --   --   --     Recent Results (from the past 240 hour(s))  Culture, blood (Routine x 2)     Status: Abnormal   Collection Time: 08/12/17 10:30 PM  Result Value Ref Range Status   Specimen Description  BLOOD LEFT ANTECUBITAL  Final   Special Requests   Final    BOTTLES DRAWN AEROBIC AND ANAEROBIC Blood Culture adequate volume   Culture  Setup Time   Final    BLOOD AEROBIC BOTTLE GRAM POSITIVE COCCI IN CLUSTERS CRITICAL RESULT CALLED TO, READ BACK BY AND VERIFIED WITH: J LEDFORD PHARMD 08/14/17 0036 JDW    Culture (A)  Final    STAPHYLOCOCCUS SPECIES (COAGULASE NEGATIVE) THE SIGNIFICANCE OF ISOLATING THIS ORGANISM FROM A SINGLE SET OF BLOOD CULTURES WHEN MULTIPLE SETS ARE DRAWN IS UNCERTAIN. PLEASE NOTIFY THE MICROBIOLOGY DEPARTMENT WITHIN ONE WEEK IF SPECIATION AND SENSITIVITIES ARE REQUIRED. Performed at [facility], [address], [place], [place] [address]    Report Status 08/15/2017 FINAL  Final  Blood Culture ID Panel (Reflexed)     Status: Abnormal   Collection Time: 08/12/17 10:30 PM  Result Value Ref Range  Status   Enterococcus species NOT DETECTED NOT DETECTED Final   Listeria monocytogenes NOT DETECTED NOT DETECTED Final   Staphylococcus species DETECTED (A) NOT DETECTED Final    Comment: Methicillin (oxacillin) susceptible coagulase negative staphylococcus. Possible blood culture contaminant (unless isolated from more than one blood culture draw or clinical case suggests pathogenicity). No antibiotic treatment is indicated for blood  culture contaminants. CRITICAL RESULT CALLED TO, READ BACK BY AND VERIFIED WITH: J LEDFORD PHARMD 08/14/17 0036 JDW    Staphylococcus aureus NOT DETECTED NOT DETECTED Final   Methicillin resistance NOT DETECTED NOT DETECTED Final   Streptococcus species NOT DETECTED NOT DETECTED Final   Streptococcus agalactiae NOT DETECTED NOT DETECTED Final   Streptococcus pneumoniae NOT DETECTED NOT DETECTED Final   Streptococcus pyogenes NOT DETECTED NOT DETECTED Final   Acinetobacter baumannii NOT DETECTED NOT DETECTED Final   Enterobacteriaceae species NOT DETECTED NOT DETECTED Final   Enterobacter cloacae complex NOT DETECTED NOT DETECTED Final    Escherichia coli NOT DETECTED NOT DETECTED Final   Klebsiella oxytoca NOT DETECTED NOT DETECTED Final   Klebsiella pneumoniae NOT DETECTED NOT DETECTED Final   Proteus species NOT DETECTED NOT DETECTED Final   Serratia marcescens NOT DETECTED NOT DETECTED Final   Haemophilus influenzae NOT DETECTED NOT DETECTED Final   Neisseria meningitidis NOT DETECTED NOT DETECTED Final   Pseudomonas aeruginosa NOT DETECTED NOT DETECTED Final   Candida albicans NOT DETECTED NOT DETECTED Final   Candida glabrata NOT DETECTED NOT DETECTED Final   Candida krusei NOT DETECTED NOT DETECTED Final   Candida parapsilosis NOT DETECTED NOT DETECTED Final   Candida tropicalis NOT DETECTED NOT DETECTED Final    Comment: Performed at [facility], [address], [place], [place] [address]  Urine culture     Status: Abnormal   Collection Time: 08/12/17 10:49 PM  Result Value Ref Range Status   Specimen Description URINE, CATHETERIZED  Final   Special Requests   Final    NONE Performed at [facility], [address] [place], [place] [address]    Culture (A)  Final    >=100,000 COLONIES/mL KLEBSIELLA PNEUMONIAE >=100,000 COLONIES/mL ESCHERICHIA COLI    Report Status 08/16/2017 FINAL  Final   Organism ID, Bacteria KLEBSIELLA PNEUMONIAE (A)  Final   Organism ID, Bacteria ESCHERICHIA COLI (A)  Final      Susceptibility   Escherichia coli - MIC*    AMPICILLIN 8 SENSITIVE Sensitive     CEFAZOLIN <=4 SENSITIVE Sensitive     CEFTRIAXONE <=1 SENSITIVE Sensitive     CIPROFLOXACIN <=0.25 SENSITIVE Sensitive     GENTAMICIN <=1 SENSITIVE Sensitive     IMIPENEM <=0.25 SENSITIVE Sensitive     NITROFURANTOIN <=16 SENSITIVE Sensitive     TRIMETH/SULFA <=20 SENSITIVE Sensitive     AMPICILLIN/SULBACTAM <=2 SENSITIVE Sensitive     PIP/TAZO <=4 SENSITIVE Sensitive     Extended ESBL NEGATIVE Sensitive     * >=100,000 COLONIES/mL ESCHERICHIA COLI   Klebsiella pneumoniae - MIC*    AMPICILLIN >=32 RESISTANT  Resistant     CEFAZOLIN <=4 SENSITIVE Sensitive     CEFTRIAXONE <=1 SENSITIVE Sensitive     CIPROFLOXACIN <=0.25 SENSITIVE Sensitive     GENTAMICIN <=1 SENSITIVE Sensitive     IMIPENEM <=0.25 SENSITIVE Sensitive     NITROFURANTOIN 32 SENSITIVE Sensitive     TRIMETH/SULFA <=20 SENSITIVE Sensitive     AMPICILLIN/SULBACTAM 4 SENSITIVE Sensitive     PIP/TAZO <=4 SENSITIVE Sensitive     Extended  ESBL NEGATIVE Sensitive     * >=100,000 COLONIES/mL KLEBSIELLA PNEUMONIAE  Culture, blood (Routine x 2)     Status: None (Preliminary result)   Collection Time: 08/12/17 11:51 PM  Result Value Ref Range Status   Specimen Description BLOOD RIGHT ANTECUBITAL  Final   Special Requests IN PEDIATRIC BOTTLE Blood Culture adequate volume  Final   Culture   Final    NO GROWTH 3 DAYS Performed at [facility], [address] [place], [place] [address]    Report Status PENDING  Incomplete  MRSA PCR Screening     Status: None   Collection Time: 08/13/17  6:42 AM  Result Value Ref Range Status   MRSA by PCR NEGATIVE NEGATIVE Final    Comment:        The GeneXpert MRSA Assay (FDA approved for NASAL specimens only), is one component of a comprehensive MRSA colonization surveillance program. It is not intended to diagnose MRSA infection nor to guide or monitor treatment for MRSA infections. Performed at [facility], [address], [place], [place] [address]          Radiology Studies: No results found.      Scheduled Meds: . benztropine  0.5 mg Oral QHS  . collagenase   Topical Daily  . folic acid  1 mg Intravenous Daily  . hydrocortisone sod succinate (SOLU-CORTEF) inj  25 mg Intravenous Q12H  . insulin aspart  0-15 Units Subcutaneous Q4H  . LORazepam  0.5 mg Oral BID  . mouth rinse  15 mL Mouth Rinse BID  . OLANZapine  10 mg Oral QHS  . rivaroxaban  20 mg Oral Q supper  . thiamine injection  100 mg Intravenous Daily   Continuous Infusions: . sodium chloride Stopped  (08/15/17 1200)  . cefTRIAXone (ROCEPHIN)  IV Stopped (08/16/17 0981)     LOS: 4 days     Jacquelin Hawking, MD [facility] 08/17/2017, 10:51 AM Pager: [phone]  If 7PM-7AM, please contact night-coverage [email or web address] Password [facility] 08/17/2017, 10:51 AM

## 2017-08-17 NOTE — Progress Notes (Signed)
Consult received, chart reviewed.  Went by to see patient but she is unable to participate in assessment secondary to altered mental status/dementia.  Per chart review, her brother, Thora LanceMarcus Dolbow is a point of contact.  I have left a message with Mr. Warren DanesCarpenter at [phone].  Palliative medicine to continue to follow. Eduard RouxSarah Eamonn Sermeno, ANP

## 2017-08-17 NOTE — Discharge Instructions (Signed)
Information on my medicine - XARELTO (rivaroxaban)  This medication education was reviewed with me or my healthcare representative as part of my discharge preparation.   WHY WAS XARELTO PRESCRIBED FOR YOU? Xarelto was prescribed to hisotry of blood clots and to reduce the risk of them occurring again.  What do you need to know about Xarelto? Take one 20 mg tablet ONCE A DAY with your evening meal.  DO NOT stop taking Xarelto without talking to the health care provider who prescribed the medication.  Refill your prescription for 20 mg tablets before you run out.  After discharge, you should have regular check-up appointments with your healthcare provider that is prescribing your Xarelto.  In the future your dose may need to be changed if your kidney function changes by a significant amount.  What do you do if you miss a dose?  If you are taking Xarelto ONCE DAILY and you miss a dose, take it as soon as you remember on the same day then continue your regularly scheduled once daily regimen the next day. Do not take two doses of Xarelto at the same time.   Important Safety Information Xarelto is a blood thinner medicine that can cause bleeding. You should call your healthcare provider right away if you experience any of the following: ? Bleeding from an injury or your nose that does not stop. ? Unusual colored urine (red or dark brown) or unusual colored stools (red or black). ? Unusual bruising for unknown reasons. ? A serious fall or if you hit your head (even if there is no bleeding).  Some medicines may interact with Xarelto and might increase your risk of bleeding while on Xarelto. To help avoid this, consult your healthcare provider or pharmacist prior to using any new prescription or non-prescription medications, including herbals, vitamins, non-steroidal anti-inflammatory drugs (NSAIDs) and supplements.  This website has more information on Xarelto: [email or web address].

## 2017-08-18 LAB — CBC
HCT: 31.1 % — ABNORMAL LOW (ref 36.0–46.0)
Hemoglobin: 9.9 g/dL — ABNORMAL LOW (ref 12.0–15.0)
MCH: 25.9 pg — ABNORMAL LOW (ref 26.0–34.0)
MCHC: 31.8 g/dL (ref 30.0–36.0)
MCV: 81.4 fL (ref 78.0–100.0)
PLATELETS: 274 10*3/uL (ref 150–400)
RBC: 3.82 MIL/uL — ABNORMAL LOW (ref 3.87–5.11)
RDW: 16 % — AB (ref 11.5–15.5)
WBC: 11 10*3/uL — AB (ref 4.0–10.5)

## 2017-08-18 LAB — BASIC METABOLIC PANEL
Anion gap: 12 (ref 5–15)
CHLORIDE: 106 mmol/L (ref 101–111)
CO2: 24 mmol/L (ref 22–32)
CREATININE: 0.74 mg/dL (ref 0.44–1.00)
Calcium: 8.3 mg/dL — ABNORMAL LOW (ref 8.9–10.3)
GFR calc Af Amer: >60 mL/min (ref >60)
Glucose, Bld: 112 mg/dL — ABNORMAL HIGH (ref 65–99)
Potassium: 3.4 mmol/L — ABNORMAL LOW (ref 3.5–5.1)
SODIUM: 142 mmol/L (ref 135–145)

## 2017-08-18 LAB — CULTURE, BLOOD (ROUTINE X 2)
Culture: NO GROWTH
Special Requests: ADEQUATE

## 2017-08-18 LAB — GLUCOSE, CAPILLARY
GLUCOSE-CAPILLARY: 175 mg/dL — AB (ref 65–99)
GLUCOSE-CAPILLARY: 130 mg/dL — AB (ref 65–99)
GLUCOSE-CAPILLARY: 112 mg/dL — AB (ref 65–99)
Glucose-Capillary: 101 mg/dL — ABNORMAL HIGH (ref 65–99)
Glucose-Capillary: 118 mg/dL — ABNORMAL HIGH (ref 65–99)

## 2017-08-18 LAB — MAGNESIUM: MAGNESIUM: 1.7 mg/dL (ref 1.7–2.4)

## 2017-08-18 NOTE — Progress Notes (Signed)
Palliative Medicine RN Note: Called by Dr Caleb PoppNettey to let us know family is at bedside. PMT NP is going into a family meeting, so at her request, I brought [employer] books with copies of MOST forms so they will know what decisions will come in the future.   When I arrived, son was playing with a baby on his mother's lap. He looked up at me but did not return my greeting or engage with me. There was a young woman seated on the couch. I attempted to engage her and explain what information I was leaving, but she would not make eye contact and did not take the offered information when I held it out to her. I left the books and papers on the couch and let them know that we are available if they would like to talk; the nurse can page us.  At this time, family is actively avoiding PMT even when in the same room. If the family requests our assistance, we will be happy to engage, but we have nothing to offer until they are ready. Please let us know if the situation changes or if the family requests our help. Otherwise we will shadow the chart for decline.  Margret ChanceMelanie G. Briena Swingler, RN, BSN, [facility] Palliative Medicine Team 08/18/2017 2:26 PM Office [phone]

## 2017-08-18 NOTE — Progress Notes (Addendum)
PROGRESS NOTE    Krystin Keeven  ZOX:[id number] DOB: Nov 03, 1955 DOA: 08/12/2017 PCP: Kirt Boys, DO   Brief Narrative: Sarah Mckay is a 62 y.o. female with a history of ethanol abuse, dementia, DM2, chronic foley for urinary retention, chronic left leg DVT. She presented with altered mental status and hypotension requiring vasopressor support. She was found to have a UTI with negative blood culture to date. Initially treated with Vancomycin/Zosyn and transitioned to ceftriaxone. She has improved mentally.   Assessment & Plan:   Active Problems:   Sepsis [facility])   Palliative care encounter   Septic shock Sepsis secondary to E. Coli/kebsiella pneumoniae UTI E. Coli/kebsiella pneumoniae UTI Sepsis resolved. Stable on antibiotics. Blood cultures no growth to date. -Continue Ceftriaxone -Wean down solu-cortef, watch blood pressure  Sacral decubitus ulcer Stage 2 -Wound care  Chronic combined systolic and diastolic heart failure Last EF of 20-25% with diffuse hypokinesis and grade 2 diastolic dysfunction. Currently stable. -Continue home torsemide -Continue to hold lisinopril in setting of sepsis on presentation -Daily weight and in/out  Acute kidney injury Secondary to septic shock. Resolved.  Hypomagnesemia Hypokalemia Supplementation given. Resolved.  Urinary retention Chronic foley Plan to change foley after completion of antibiotics. Unknown when last change occurred.  Chronic left upper leg DVT Patient on Xarelto as an outpatient. -Continue Xarelto  Diabetes mellitus, type 2 -Continue SSI  Metabolic encephalopathy Ethanol dementia Unknown baseline. Resides at [facility]. Appears stable. Appears family is avoiding palliative medicine. If needed will re-consult, otherwise thanks for their attempts.   DVT prophylaxis: Xarelto Code Status:   Code Status: Full Code Family Communication: None at bedside Disposition Plan: Discharge to SNF when bed available and  when weaned off of solu-cortef/UTI treatment completed. Anticipate patient being medically ready for discharge on 3/24   Consultants:   Critical care medicine  Palliative care medicine  Procedures:   None  Antimicrobials:  Vancomycin  Zosyn  Ceftriaxone    Subjective: Patient wants to walk.  Objective: Vitals:   08/17/17 2133 08/18/17 0430 08/18/17 0826 08/18/17 1614  BP: 119/61 (!) 113/51 114/61 (!) 114/58  Pulse: 97 70 (!) 101 (!) 102  Resp: 16 18 18 18   Temp: 98.3 F (36.8 C) 98.9 F (37.2 C) 98.8 F (37.1 C) 99.1 F (37.3 C)  TempSrc: Oral Oral Oral Oral  SpO2: 100% 98% 99% 100%  Weight:  60.6 kg (133 lb 9.6 oz)    Height:        Intake/Output Summary (Last 24 hours) at 08/18/2017 1701 Last data filed at 08/18/2017 0600 Gross per 24 hour  Intake 480 ml  Output 850 ml  Net -370 ml   Filed Weights   08/16/17 0555 08/17/17 0500 08/18/17 0430  Weight: 55.9 kg (123 lb 3.8 oz) 55.9 kg (123 lb 3.8 oz) 60.6 kg (133 lb 9.6 oz)    Examination:  General exam: Calm and comfortable Respiratory: no excessive effort Central nervous system: Alert, oriented to person. Moves extremities spontaneously. Psych: flat affect, agitated.    Data Reviewed: I have personally reviewed following labs and imaging studies  CBC: Recent Labs  Lab 08/12/17 2231  08/14/17 0229 08/15/17 0244 08/16/17 0242 08/17/17 0657 08/18/17 0721  WBC 18.9*   < > 13.8* 11.8* 10.7* 9.0 11.0*  NEUTROABS 13.7*  --   --   --   --   --   --   HGB 12.0   < > 10.0* 9.5* 8.9* 9.5* 9.9*  HCT 37.0   < > 31.0* 30.4*  28.7* 30.6* 31.1*  MCV 82.8   < > 82.9 82.4 82.5 82.3 81.4  PLT 414*   < > 335 328 293 281 274   < > = values in this interval not displayed.   Basic Metabolic Panel: Recent Labs  Lab 08/13/17 0340  08/14/17 0229 08/15/17 0244 08/16/17 0242 08/16/17 1434 08/17/17 0657 08/18/17 0721  NA 143   < > 151* 150* 151*  --  146* 142  K 4.0   < > 3.5 3.3* 3.1* 3.5 3.8 3.4*  CL  112*   < > 111 113* 116*  --  113* 106  CO2 22   < > 23 24 25   --  22 24  GLUCOSE 124*   < > 106* 137* 114*  --  112* 112*  BUN 64*   < > 24* 12 7  --  <5* <5*  CREATININE 3.20*   < > 1.09* 0.88 0.82  --  0.79 0.74  CALCIUM 7.6*   < > 9.1 8.7* 8.1*  --  8.7* 8.3*  MG 1.7  --  2.0 1.6*  --  1.5* 2.2 1.7  PHOS 4.6  --  1.9* 2.5  --   --   --   --    < > = values in this interval not displayed.   GFR: Estimated Creatinine Clearance: 62.6 mL/min (by C-G formula based on SCr of 0.74 mg/dL). Liver Function Tests: Recent Labs  Lab 08/12/17 2231  AST 26  ALT 24  ALKPHOS 92  BILITOT 0.4  PROT 9.4*  ALBUMIN 3.5   No results for input(s): LIPASE, AMYLASE in the last 168 hours. No results for input(s): AMMONIA in the last 168 hours. Coagulation Profile: Recent Labs  Lab 08/12/17 2231  INR 1.15   Cardiac Enzymes: No results for input(s): CKTOTAL, CKMB, CKMBINDEX, TROPONINI in the last 168 hours. BNP (last 3 results) No results for input(s): PROBNP in the last 8760 hours. HbA1C: No results for input(s): HGBA1C in the last 72 hours. CBG: Recent Labs  Lab 08/17/17 2131 08/18/17 0412 08/18/17 0827 08/18/17 1153 08/18/17 1613  GLUCAP 117* 101* 118* 175* 130*   Lipid Profile: No results for input(s): CHOL, HDL, LDLCALC, TRIG, CHOLHDL, LDLDIRECT in the last 72 hours. Thyroid Function Tests: No results for input(s): TSH, T4TOTAL, FREET4, T3FREE, THYROIDAB in the last 72 hours. Anemia Panel: No results for input(s): VITAMINB12, FOLATE, FERRITIN, TIBC, IRON, RETICCTPCT in the last 72 hours. Sepsis Labs: Recent Labs  Lab 08/12/17 2243 08/13/17 0055 08/13/17 0340 08/14/17 0229 08/15/17 0244  PROCALCITON  --   --  0.24 <0.10 <0.10  LATICACIDVEN 1.48 1.63  --   --   --     Recent Results (from the past 240 hour(s))  Culture, blood (Routine x 2)     Status: Abnormal   Collection Time: 08/12/17 10:30 PM  Result Value Ref Range Status   Specimen Description BLOOD LEFT  ANTECUBITAL  Final   Special Requests   Final    BOTTLES DRAWN AEROBIC AND ANAEROBIC Blood Culture adequate volume   Culture  Setup Time   Final    BLOOD AEROBIC BOTTLE GRAM POSITIVE COCCI IN CLUSTERS CRITICAL RESULT CALLED TO, READ BACK BY AND VERIFIED WITH: J LEDFORD PHARMD 08/14/17 0036 JDW    Culture (A)  Final    STAPHYLOCOCCUS SPECIES (COAGULASE NEGATIVE) THE SIGNIFICANCE OF ISOLATING THIS ORGANISM FROM A SINGLE SET OF BLOOD CULTURES WHEN MULTIPLE SETS ARE DRAWN IS UNCERTAIN. PLEASE NOTIFY THE MICROBIOLOGY DEPARTMENT  WITHIN ONE WEEK IF SPECIATION AND SENSITIVITIES ARE REQUIRED. Performed at [facility], [address] [place], [place] [address]    Report Status 08/15/2017 FINAL  Final  Blood Culture ID Panel (Reflexed)     Status: Abnormal   Collection Time: 08/12/17 10:30 PM  Result Value Ref Range Status   Enterococcus species NOT DETECTED NOT DETECTED Final   Listeria monocytogenes NOT DETECTED NOT DETECTED Final   Staphylococcus species DETECTED (A) NOT DETECTED Final    Comment: Methicillin (oxacillin) susceptible coagulase negative staphylococcus. Possible blood culture contaminant (unless isolated from more than one blood culture draw or clinical case suggests pathogenicity). No antibiotic treatment is indicated for blood  culture contaminants. CRITICAL RESULT CALLED TO, READ BACK BY AND VERIFIED WITH: J LEDFORD PHARMD 08/14/17 0036 JDW    Staphylococcus aureus NOT DETECTED NOT DETECTED Final   Methicillin resistance NOT DETECTED NOT DETECTED Final   Streptococcus species NOT DETECTED NOT DETECTED Final   Streptococcus agalactiae NOT DETECTED NOT DETECTED Final   Streptococcus pneumoniae NOT DETECTED NOT DETECTED Final   Streptococcus pyogenes NOT DETECTED NOT DETECTED Final   Acinetobacter baumannii NOT DETECTED NOT DETECTED Final   Enterobacteriaceae species NOT DETECTED NOT DETECTED Final   Enterobacter cloacae complex NOT DETECTED NOT DETECTED Final    Escherichia coli NOT DETECTED NOT DETECTED Final   Klebsiella oxytoca NOT DETECTED NOT DETECTED Final   Klebsiella pneumoniae NOT DETECTED NOT DETECTED Final   Proteus species NOT DETECTED NOT DETECTED Final   Serratia marcescens NOT DETECTED NOT DETECTED Final   Haemophilus influenzae NOT DETECTED NOT DETECTED Final   Neisseria meningitidis NOT DETECTED NOT DETECTED Final   Pseudomonas aeruginosa NOT DETECTED NOT DETECTED Final   Candida albicans NOT DETECTED NOT DETECTED Final   Candida glabrata NOT DETECTED NOT DETECTED Final   Candida krusei NOT DETECTED NOT DETECTED Final   Candida parapsilosis NOT DETECTED NOT DETECTED Final   Candida tropicalis NOT DETECTED NOT DETECTED Final    Comment: Performed at [facility], [address] [place], [place] [address]  Urine culture     Status: Abnormal   Collection Time: 08/12/17 10:49 PM  Result Value Ref Range Status   Specimen Description URINE, CATHETERIZED  Final   Special Requests   Final    NONE Performed at [facility], [address] [place], [place] [address]    Culture (A)  Final    >=100,000 COLONIES/mL KLEBSIELLA PNEUMONIAE >=100,000 COLONIES/mL ESCHERICHIA COLI    Report Status 08/16/2017 FINAL  Final   Organism ID, Bacteria KLEBSIELLA PNEUMONIAE (A)  Final   Organism ID, Bacteria ESCHERICHIA COLI (A)  Final      Susceptibility   Escherichia coli - MIC*    AMPICILLIN 8 SENSITIVE Sensitive     CEFAZOLIN <=4 SENSITIVE Sensitive     CEFTRIAXONE <=1 SENSITIVE Sensitive     CIPROFLOXACIN <=0.25 SENSITIVE Sensitive     GENTAMICIN <=1 SENSITIVE Sensitive     IMIPENEM <=0.25 SENSITIVE Sensitive     NITROFURANTOIN <=16 SENSITIVE Sensitive     TRIMETH/SULFA <=20 SENSITIVE Sensitive     AMPICILLIN/SULBACTAM <=2 SENSITIVE Sensitive     PIP/TAZO <=4 SENSITIVE Sensitive     Extended ESBL NEGATIVE Sensitive     * >=100,000 COLONIES/mL ESCHERICHIA COLI   Klebsiella pneumoniae - MIC*    AMPICILLIN >=32 RESISTANT  Resistant     CEFAZOLIN <=4 SENSITIVE Sensitive     CEFTRIAXONE <=1 SENSITIVE Sensitive     CIPROFLOXACIN <=0.25  SENSITIVE Sensitive     GENTAMICIN <=1 SENSITIVE Sensitive     IMIPENEM <=0.25 SENSITIVE Sensitive     NITROFURANTOIN 32 SENSITIVE Sensitive     TRIMETH/SULFA <=20 SENSITIVE Sensitive     AMPICILLIN/SULBACTAM 4 SENSITIVE Sensitive     PIP/TAZO <=4 SENSITIVE Sensitive     Extended ESBL NEGATIVE Sensitive     * >=100,000 COLONIES/mL KLEBSIELLA PNEUMONIAE  Culture, blood (Routine x 2)     Status: None   Collection Time: 08/12/17 11:51 PM  Result Value Ref Range Status   Specimen Description BLOOD RIGHT ANTECUBITAL  Final   Special Requests IN PEDIATRIC BOTTLE Blood Culture adequate volume  Final   Culture   Final    NO GROWTH 5 DAYS Performed at [facility], [address], [place], [place] [address]    Report Status 08/18/2017 FINAL  Final  MRSA PCR Screening     Status: None   Collection Time: 08/13/17  6:42 AM  Result Value Ref Range Status   MRSA by PCR NEGATIVE NEGATIVE Final    Comment:        The GeneXpert MRSA Assay (FDA approved for NASAL specimens only), is one component of a comprehensive MRSA colonization surveillance program. It is not intended to diagnose MRSA infection nor to guide or monitor treatment for MRSA infections. Performed at [facility], [address] [place], [place] [address]          Radiology Studies: No results found.      Scheduled Meds: . benztropine  0.5 mg Oral QHS  . collagenase   Topical Daily  . folic acid  1 mg Oral Daily  . hydrocortisone sod succinate (SOLU-CORTEF) inj  25 mg Intravenous Daily  . insulin aspart  0-15 Units Subcutaneous Q4H  . LORazepam  0.5 mg Oral BID  . mouth rinse  15 mL Mouth Rinse BID  . OLANZapine  10 mg Oral QHS  . rivaroxaban  20 mg Oral Q supper  . thiamine  100 mg Oral Daily  . torsemide  20 mg Oral Daily   Continuous Infusions: . sodium chloride Stopped  (08/15/17 1200)     LOS: 5 days     Jacquelin Hawking, MD [facility] 08/18/2017, 5:01 PM Pager: [phone]  If 7PM-7AM, please contact night-coverage [email or web address] Password [facility] 08/18/2017, 5:01 PM

## 2017-08-18 NOTE — Progress Notes (Addendum)
Went by to see patient.  Family had been at the bedside and had been informed that I would be there within 5 minutes but did not wait.  I attempted to reach patient's son, Sarah Mckay at [phone] and LM. Will continue and try to reach family If pt is discharged before palliative medicine can connect with family, recommend in DC summary to have palliative community provider FU in the facility either thru [facility] [phone]; Palliative Care division of [facility] [phone] Thank you,  Eduard RouxSarah Jahi Roza, ANP

## 2017-08-19 DIAGNOSIS — L8940 Pressure ulcer of contiguous site of back, buttock and hip, unspecified stage: Secondary | ICD-10-CM

## 2017-08-19 LAB — GLUCOSE, CAPILLARY
GLUCOSE-CAPILLARY: 100 mg/dL — AB (ref 65–99)
GLUCOSE-CAPILLARY: 128 mg/dL — AB (ref 65–99)
Glucose-Capillary: 92 mg/dL (ref 65–99)
Glucose-Capillary: 167 mg/dL — ABNORMAL HIGH (ref 65–99)

## 2017-08-19 LAB — BASIC METABOLIC PANEL
ANION GAP: 11 (ref 5–15)
BUN: <5 mg/dL — ABNORMAL LOW (ref 6–20)
CALCIUM: 8.7 mg/dL — AB (ref 8.9–10.3)
CO2: 27 mmol/L (ref 22–32)
CREATININE: 0.76 mg/dL (ref 0.44–1.00)
Chloride: 106 mmol/L (ref 101–111)
Glucose, Bld: 115 mg/dL — ABNORMAL HIGH (ref 65–99)
Potassium: 2.9 mmol/L — ABNORMAL LOW (ref 3.5–5.1)
SODIUM: 144 mmol/L (ref 135–145)

## 2017-08-19 LAB — CBC
HCT: 33 % — ABNORMAL LOW (ref 36.0–46.0)
HEMOGLOBIN: 10.5 g/dL — AB (ref 12.0–15.0)
MCH: 26.3 pg (ref 26.0–34.0)
MCHC: 31.8 g/dL (ref 30.0–36.0)
MCV: 82.5 fL (ref 78.0–100.0)
PLATELETS: 305 10*3/uL (ref 150–400)
RBC: 4 MIL/uL (ref 3.87–5.11)
RDW: 16.3 % — ABNORMAL HIGH (ref 11.5–15.5)
WBC: 12.5 10*3/uL — ABNORMAL HIGH (ref 4.0–10.5)

## 2017-08-19 LAB — MAGNESIUM: MAGNESIUM: 1.5 mg/dL — AB (ref 1.7–2.4)

## 2017-08-19 MED ORDER — LORAZEPAM 0.5 MG PO TABS
0.5000 mg | ORAL_TABLET | Freq: Two times a day (BID) | ORAL | 0 refills | Status: DC
Start: 2017-08-19 — End: 2017-09-26

## 2017-08-19 MED ORDER — TRAMADOL HCL 50 MG PO TABS: 50.0000 mg | ORAL_TABLET | Freq: Four times a day (QID) | ORAL | 0 refills | Status: DC

## 2017-08-19 MED ORDER — POTASSIUM CHLORIDE 20 MEQ/15ML (10%) PO SOLN
40.0000 meq | ORAL | Status: DC
  Administered 2017-08-19: 40 meq via ORAL
  Filled 2017-08-19: qty 30

## 2017-08-19 MED ORDER — RIVAROXABAN 20 MG PO TABS: 20.0000 mg | ORAL_TABLET | Freq: Every day | ORAL | Status: AC

## 2017-08-19 NOTE — Clinical Social Work Note (Signed)
Clinical Social Worker facilitated patient discharge including contacting patient family and facility to confirm patient discharge plans.  Clinical information faxed to facility and family agreeable with plan.  CSW arranged ambulance transport via [facility] to [facility]-- room 204 B .  RN to call [phone] for report prior to discharge.  Clinical Social Worker will sign off for now as social work intervention is no longer needed. Please consult us again if new need arises.  [address]Bailynn Dyk [place], [place] [phone]  \

## 2017-08-19 NOTE — Clinical Social Work Note (Signed)
CSW spoke with pt's uncle and he is agreeable to pt returning to [facility] with the intention on working with the Child psychotherapistsocial worker in the facility to find pt a long [place].  [place], [place] [phone]

## 2017-08-19 NOTE — Clinical Social Work Placement (Addendum)
   CLINICAL SOCIAL WORK PLACEMENT  NOTE  Date:  08/19/2017  Patient Details  Name: Sarah Mckay MRN: [id number] Date of Birth: 05/12/1956  Clinical Social Work is seeking post-discharge placement for this patient at the Skilled  Nursing Facility level of care (*CSW will initial, date and re-position this form in  chart as items are completed):      Patient/family provided with [facility] Clinical Social Work Department's list of facilities offering this level of care within the geographic area requested by the patient (or if unable, by the patient's family).  Yes   Patient/family informed of their freedom to choose among providers that offer the needed level of care, that participate in Medicare, Medicaid or managed care program needed by the patient, have an available bed and are willing to accept the patient.      Patient/family informed of [facility]'s ownership interest in [facility] and [facility], as well as of the fact that they are under no obligation to receive care at these facilities.  PASRR submitted to EDS on       PASRR number received on 08/15/17     Existing PASRR number confirmed on       FL2 transmitted to all facilities in geographic area requested by pt/family on 08/15/17     FL2 transmitted to all facilities within larger geographic area on       Patient informed that his/her managed care company has contracts with or will negotiate with certain facilities, including the following:        Yes   Patient/family informed of bed offers received.  Patient [facility] bed at [facility]     Physician recommends and patient chooses bed at      Patient to be transferred [facility] on 08/19/17.  [facility]     Patient family notified on 08/19/17 of transfer.  Name of family member notified:  Sarah Mckay  PHYSICIAN       Additional Comment:     _______________________________________________ Maree KrabbeBridget A Cydnie Deason, LCSW 08/19/2017, 3:43 PM

## 2017-08-19 NOTE — Progress Notes (Signed)
Report called to [place] at [facility]. No further questions at this time.

## 2017-08-19 NOTE — Discharge Summary (Addendum)
Physician Discharge Summary  Sarah Mckay ZOX:[id number] DOB: 1955-07-22 DOA: 08/12/2017  PCP: Kirt Boys, DO  Admit date: 08/12/2017 Discharge date: 08/19/2017  Admitted From: SNF Disposition: SNF  Recommendations for Outpatient Follow-up:  1. Follow up with PCP in 1 week 2. Please obtain BMP in 1-3 days 3. Consider palliative care consult for goals of care discussions with family 4. Please follow up on the following pending results: None  Home Health: SNF Equipment/Devices: None  Discharge Condition: Stable CODE STATUS: Full code Diet recommendation:  Diet recommendations: Dysphagia 1 (puree);Thin liquid Liquids provided via: Cup;Straw Medication Administration: Crushed with puree Supervision: Patient able to self feed;Full supervision/cueing for compensatory strategies Compensations: Slow rate;Minimize environmental distractions;Small sips/bites Postural Changes and/or Swallow Maneuvers: Seated upright 90 degrees                  Brief/Interim Summary:  Admission HPI written by Jovita Kussmaul, NP   CHIEF COMPLAINT:  Sepsis   HISTORY OF PRESENT ILLNESS:   62 year old female with PMH of DVT, ETOH, Dementia, DM, 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 Upon arrival BP 85/58. U/A positive. Crt 4.47. Patient given Vancomycin/Zosyn, 4L NS without improvement of BP and started on Levophed gtt.  Upon assessment in the ED patient systolic 130-150 on 10 mcg Levophed. Levophed titrated off. Systolic maintaining 90-100 systolic. Patient awakens to physical stimulation.      Hospital course:  Septic shock Sepsis secondary to E. Coli/kebsiella pneumoniae UTI E. Coli/kebsiella pneumoniae UTI Catheter associated UTI Sepsis resolved. Stable on antibiotics. Blood cultures no growth to date. Completed course of Vancomycin/Zosyn (1 day) and ceftriaxone (5 days).  Solu-cortef was weaned and discontinued.  Sacral decubitus ulcer Stage 2. Wound care.  Chronic combined systolic and diastolic heart failure Last EF of 20-25% with diffuse hypokinesis and grade 2 diastolic dysfunction. Currently stable. -Continue home torsemide -Continue to hold lisinopril in setting of sepsis on presentation -Daily weight and in/out  Acute kidney injury Secondary to septic shock. Resolved.  Urinary retention Chronic foley Changed foley after completion of antibiotics.  Chronic left upper leg DVT Patient on Xarelto as an outpatient. Continued.  Diabetes mellitus, type 2 Hemoglobin A1C of 7.2 Sliding scale insulin while inpatient. Resume metformin and Tradjenta on discharge.  Metabolic encephalopathy Ethanol dementia Unknown baseline. Resides at [facility]. Appears stable. Palliative care unable to meet with family.   Hypokalemia Given potassium prior to discharge. Will need repeat BMP and continued supplementation.   Discharge Diagnoses:  Active Problems:   Sepsis [facility])   Palliative care encounter    Discharge Instructions   Allergies as of 08/19/2017   No Known Allergies     Medication List    TAKE these medications   atorvastatin 10 MG tablet Commonly known as:  LIPITOR Take 10 mg by mouth daily.   benztropine 0.5 MG tablet Commonly known as:  COGENTIN Take 0.5 mg by mouth at bedtime.   cholecalciferol 1000 units tablet Commonly known as:  VITAMIN D Take 1,000 Units by mouth daily. Reported on 10/28/2015   Cranberry 400 MG Tabs Take 1 tablet by mouth 2 (two) times daily.   DECUBI-VITE Caps Take 2 capsules by mouth at bedtime.   diclofenac sodium 1 % Gel Commonly known as:  VOLTAREN Apply 2 g topically 4 (four) times daily. Apply to both knees topically four times a day for pain.   ENSURE  Give 90cc by mouth three times a day in between meals for weight support What changed:  Another medication with the same name was removed.  Continue taking this medication, and follow the directions you see here.   feeding supplement (PRO-STAT SUGAR FREE 64) Liqd Take 30 mLs by mouth 2 (two) times daily.   gabapentin 300 MG capsule Commonly known as:  NEURONTIN Take 300 mg by mouth 3 (three) times daily.   linaclotide 72 MCG capsule Commonly known as:  LINZESS Take 72 mcg by mouth daily before breakfast.   linagliptin 5 MG Tabs tablet Commonly known as:  TRADJENTA Take 5 mg by mouth daily.   lisinopril 2.5 MG tablet Commonly known as:  PRINIVIL,ZESTRIL Take 2.5 mg by mouth daily. Hold for SBP < 110   LORazepam 0.5 MG tablet Commonly known as:  ATIVAN Take 1 tablet (0.5 mg total) by mouth 2 (two) times daily. What changed:    medication strength  how much to take   Melatonin 3 MG Tabs Take 3 mg by mouth at bedtime.   metFORMIN 500 MG tablet Commonly known as:  GLUCOPHAGE Take 500 mg by mouth daily.   OLANZapine 10 MG tablet Commonly known as:  ZYPREXA Take 10 mg by mouth at bedtime.   polyethylene glycol packet Commonly known as:  MIRALAX / GLYCOLAX Take 17 g by mouth 2 (two) times daily.   potassium chloride SA 20 MEQ tablet Commonly known as:  K-DUR,KLOR-CON Take 20 mEq by mouth 2 (two) times daily.   rivaroxaban 20 MG Tabs tablet Commonly known as:  XARELTO Take 1 tablet (20 mg total) by mouth daily with supper. What changed:  when to take this   senna-docusate 8.6-50 MG tablet Commonly known as:  Senokot-S Take 1 tablet by mouth 2 (two) times daily.   thiamine 100 MG tablet Take 100 mg by mouth daily.   torsemide 20 MG tablet Commonly known as:  DEMADEX Take 20 mg by mouth daily.   traMADol 50 MG tablet Commonly known as:  ULTRAM Take 1 tablet (50 mg total) by mouth every 6 (six) hours.       No Known Allergies  Consultations:  Palliative care medicine  Critical care medicine   Procedures/Studies: Dg Chest 2 View  Result Date: 08/12/2017 CLINICAL DATA:  Generalized  weakness EXAM: CHEST - 2 VIEW COMPARISON:  Chest CT April 29, 2017 and chest radiograph April 26, 2017 FINDINGS: There is no edema or consolidation. There is minimal right base atelectasis. Heart size and pulmonary vascularity are normal. No adenopathy. No evident bone lesions. IMPRESSION: Minimal right base atelectasis. No edema or consolidation. No adenopathy. Cardiac silhouette within normal limits. Electronically Signed   By: Bretta Bang III M.D.   On: 08/12/2017 23:37   US Renal  Result Date: 08/13/2017 CLINICAL DATA:  Acute kidney failure. EXAM: RENAL / URINARY TRACT ULTRASOUND COMPLETE COMPARISON:  CT 04/26/2017 FINDINGS: Right Kidney: Length: 10.7 cm. Echogenicity within normal limits. No mass or hydronephrosis visualized. Left Kidney: Length: 10.7 cm. Echogenicity within normal limits. No mass or hydronephrosis visualized. Bladder: Decompressed by Foley catheter. Gallstones are incidentally noted. IMPRESSION: Unremarkable sonographic appearance of the kidneys. Electronically Signed   By: Rubye Oaks M.D.   On: 08/13/2017 04:05     Subjective: No issues overnight  Discharge Exam: Vitals:   08/19/17 0556 08/19/17 0900  BP: (!) 105/55 138/68  Pulse: 91 73  Resp: 18 18  Temp: (!) 97.4 F (36.3 C) 97.7 F (36.5 C)  SpO2: 100% 98%   Vitals:   08/18/17 2054 08/19/17 0500 08/19/17 0556 08/19/17 0900  BP: 115/66  (!) 105/55 138/68  Pulse: 95  91 73  Resp: 17  18 18   Temp: 98.5 F (36.9 C)  (!) 97.4 F (36.3 C) 97.7 F (36.5 C)  TempSrc: Oral  Axillary Oral  SpO2: 100%  100% 98%  Weight: 56.7 kg (125 lb) 56.7 kg (125 lb)    Height:        General: Pt is alert, awake, not in acute distress   The results of significant diagnostics from this hospitalization (including imaging, microbiology, ancillary and laboratory) are listed below for reference.     Microbiology: Recent Results (from the past 240 hour(s))  Culture, blood (Routine x 2)     Status: Abnormal    Collection Time: 08/12/17 10:30 PM  Result Value Ref Range Status   Specimen Description BLOOD LEFT ANTECUBITAL  Final   Special Requests   Final    BOTTLES DRAWN AEROBIC AND ANAEROBIC Blood Culture adequate volume   Culture  Setup Time   Final    BLOOD AEROBIC BOTTLE GRAM POSITIVE COCCI IN CLUSTERS CRITICAL RESULT CALLED TO, READ BACK BY AND VERIFIED WITH: J LEDFORD PHARMD 08/14/17 0036 JDW    Culture (A)  Final    STAPHYLOCOCCUS SPECIES (COAGULASE NEGATIVE) THE SIGNIFICANCE OF ISOLATING THIS ORGANISM FROM A SINGLE SET OF BLOOD CULTURES WHEN MULTIPLE SETS ARE DRAWN IS UNCERTAIN. PLEASE NOTIFY THE MICROBIOLOGY DEPARTMENT WITHIN ONE WEEK IF SPECIATION AND SENSITIVITIES ARE REQUIRED. Performed at [facility], [address] [place], [place] [id number]    Report Status 08/15/2017 FINAL  Final  Blood Culture ID Panel (Reflexed)     Status: Abnormal   Collection Time: 08/12/17 10:30 PM  Result Value Ref Range Status   Enterococcus species NOT DETECTED NOT DETECTED Final   Listeria monocytogenes NOT DETECTED NOT DETECTED Final   Staphylococcus species DETECTED (A) NOT DETECTED Final    Comment: Methicillin (oxacillin) susceptible coagulase negative staphylococcus. Possible blood culture contaminant (unless isolated from more than one blood culture draw or clinical case suggests pathogenicity). No antibiotic treatment is indicated for blood  culture contaminants. CRITICAL RESULT CALLED TO, READ BACK BY AND VERIFIED WITH: J LEDFORD PHARMD 08/14/17 0036 JDW    Staphylococcus aureus NOT DETECTED NOT DETECTED Final   Methicillin resistance NOT DETECTED NOT DETECTED Final   Streptococcus species NOT DETECTED NOT DETECTED Final   Streptococcus agalactiae NOT DETECTED NOT DETECTED Final   Streptococcus pneumoniae NOT DETECTED NOT DETECTED Final   Streptococcus pyogenes NOT DETECTED NOT DETECTED Final   Acinetobacter baumannii NOT DETECTED NOT DETECTED Final   Enterobacteriaceae species NOT  DETECTED NOT DETECTED Final   Enterobacter cloacae complex NOT DETECTED NOT DETECTED Final   Escherichia coli NOT DETECTED NOT DETECTED Final   Klebsiella oxytoca NOT DETECTED NOT DETECTED Final   Klebsiella pneumoniae NOT DETECTED NOT DETECTED Final   Proteus species NOT DETECTED NOT DETECTED Final   Serratia marcescens NOT DETECTED NOT DETECTED Final   Haemophilus influenzae NOT DETECTED NOT DETECTED Final   Neisseria meningitidis NOT DETECTED NOT DETECTED Final   Pseudomonas aeruginosa NOT DETECTED NOT DETECTED Final   Candida albicans NOT DETECTED NOT DETECTED Final   Candida glabrata NOT DETECTED NOT DETECTED Final   Candida krusei NOT DETECTED NOT DETECTED Final   Candida parapsilosis NOT DETECTED NOT DETECTED Final   Candida tropicalis NOT DETECTED NOT DETECTED Final    Comment: Performed at [facility], [address], [place], [place] [address]  Urine culture     Status: Abnormal   Collection Time: 08/12/17 10:49 PM  Result Value Ref Range Status   Specimen Description URINE, CATHETERIZED  Final   Special Requests   Final    NONE Performed at [facility], [address], [place], [place] [address]    Culture (A)  Final    >=100,000 COLONIES/mL KLEBSIELLA PNEUMONIAE >=100,000 COLONIES/mL ESCHERICHIA COLI    Report Status 08/16/2017 FINAL  Final   Organism ID, Bacteria KLEBSIELLA PNEUMONIAE (A)  Final   Organism ID, Bacteria ESCHERICHIA COLI (A)  Final      Susceptibility   Escherichia coli - MIC*    AMPICILLIN 8 SENSITIVE Sensitive     CEFAZOLIN <=4 SENSITIVE Sensitive     CEFTRIAXONE <=1 SENSITIVE Sensitive     CIPROFLOXACIN <=0.25 SENSITIVE Sensitive     GENTAMICIN <=1 SENSITIVE Sensitive     IMIPENEM <=0.25 SENSITIVE Sensitive     NITROFURANTOIN <=16 SENSITIVE Sensitive     TRIMETH/SULFA <=20 SENSITIVE Sensitive     AMPICILLIN/SULBACTAM <=2 SENSITIVE Sensitive     PIP/TAZO <=4 SENSITIVE Sensitive     Extended ESBL NEGATIVE Sensitive     * >=100,000  COLONIES/mL ESCHERICHIA COLI   Klebsiella pneumoniae - MIC*    AMPICILLIN >=32 RESISTANT Resistant     CEFAZOLIN <=4 SENSITIVE Sensitive     CEFTRIAXONE <=1 SENSITIVE Sensitive     CIPROFLOXACIN <=0.25 SENSITIVE Sensitive     GENTAMICIN <=1 SENSITIVE Sensitive     IMIPENEM <=0.25 SENSITIVE Sensitive     NITROFURANTOIN 32 SENSITIVE Sensitive     TRIMETH/SULFA <=20 SENSITIVE Sensitive     AMPICILLIN/SULBACTAM 4 SENSITIVE Sensitive     PIP/TAZO <=4 SENSITIVE Sensitive     Extended ESBL NEGATIVE Sensitive     * >=100,000 COLONIES/mL KLEBSIELLA PNEUMONIAE  Culture, blood (Routine x 2)     Status: None   Collection Time: 08/12/17 11:51 PM  Result Value Ref Range Status   Specimen Description BLOOD RIGHT ANTECUBITAL  Final   Special Requests IN PEDIATRIC BOTTLE Blood Culture adequate volume  Final   Culture   Final    NO GROWTH 5 DAYS Performed at [facility], [address] [place], [place] [address]    Report Status 08/18/2017 FINAL  Final  MRSA PCR Screening     Status: None   Collection Time: 08/13/17  6:42 AM  Result Value Ref Range Status   MRSA by PCR NEGATIVE NEGATIVE Final    Comment:        The GeneXpert MRSA Assay (FDA approved for NASAL specimens only), is one component of a comprehensive MRSA colonization surveillance program. It is not intended to diagnose MRSA infection nor to guide or monitor treatment for MRSA infections. Performed at [facility], [address] [place], [place] [address]      Labs: BNP (last 3 results) Recent Labs    04/26/17 2210  BNP 52.2   Basic Metabolic Panel: Recent Labs  Lab 08/13/17 0340  08/14/17 0229 08/15/17 0244 08/16/17 0242 08/16/17 1434 08/17/17 0657 08/18/17 0721 08/19/17 0649  NA 143   < > 151* 150* 151*  --  146* 142 144  K 4.0   < > 3.5 3.3* 3.1* 3.5 3.8 3.4* 2.9*  CL 112*   < > 111 113* 116*  --  113* 106 106  CO2 22   < > 23 24 25   --  22 24 27   GLUCOSE 124*   < > 106* 137* 114*  --  112*  112* 115*  BUN 64*   < > 24* 12 7  --  <5* <5* <5*  CREATININE 3.20*   < > 1.09* 0.88 0.82  --  0.79 0.74 0.76  CALCIUM 7.6*   < > 9.1 8.7* 8.1*  --  8.7* 8.3* 8.7*  MG 1.7  --  2.0 1.6*  --  1.5* 2.2 1.7 1.5*  PHOS 4.6  --  1.9* 2.5  --   --   --   --   --    < > = values in this interval not displayed.   Liver Function Tests: Recent Labs  Lab 08/12/17 2231  AST 26  ALT 24  ALKPHOS 92  BILITOT 0.4  PROT 9.4*  ALBUMIN 3.5   No results for input(s): LIPASE, AMYLASE in the last 168 hours. No results for input(s): AMMONIA in the last 168 hours. CBC: Recent Labs  Lab 08/12/17 2231  08/15/17 0244 08/16/17 0242 08/17/17 0657 08/18/17 0721 08/19/17 0649  WBC 18.9*   < > 11.8* 10.7* 9.0 11.0* 12.5*  NEUTROABS 13.7*  --   --   --   --   --   --   HGB 12.0   < > 9.5* 8.9* 9.5* 9.9* 10.5*  HCT 37.0   < > 30.4* 28.7* 30.6* 31.1* 33.0*  MCV 82.8   < > 82.4 82.5 82.3 81.4 82.5  PLT 414*   < > 328 293 281 274 305   < > = values in this interval not displayed.   Cardiac Enzymes: No results for input(s): CKTOTAL, CKMB, CKMBINDEX, TROPONINI in the last 168 hours. BNP: Invalid input(s): POCBNP CBG: Recent Labs  Lab 08/18/17 2045 08/19/17 0011 08/19/17 0500 08/19/17 0757 08/19/17 1201  GLUCAP 112* 100* 92 128* 167*   D-Dimer No results for input(s): DDIMER in the last 72 hours. Hgb A1c No results for input(s): HGBA1C in the last 72 hours. Lipid Profile No results for input(s): CHOL, HDL, LDLCALC, TRIG, CHOLHDL, LDLDIRECT in the last 72 hours. Thyroid function studies No results for input(s): TSH, T4TOTAL, T3FREE, THYROIDAB in the last 72 hours.  Invalid input(s): FREET3 Anemia work up No results for input(s): VITAMINB12, FOLATE, FERRITIN, TIBC, IRON, RETICCTPCT in the last 72 hours. Urinalysis    Component Value Date/Time   COLORURINE AMBER (A) 08/12/2017 2249   APPEARANCEUR CLOUDY (A) 08/12/2017 2249   LABSPEC 1.018 08/12/2017 2249   PHURINE 5.0 08/12/2017 2249    GLUCOSEU NEGATIVE 08/12/2017 2249   HGBUR LARGE (A) 08/12/2017 2249   BILIRUBINUR SMALL (A) 08/12/2017 2249   KETONESUR NEGATIVE 08/12/2017 2249   PROTEINUR >=300 (A) 08/12/2017 2249   NITRITE NEGATIVE 08/12/2017 2249   LEUKOCYTESUR LARGE (A) 08/12/2017 2249   Sepsis Labs Invalid input(s): PROCALCITONIN,  WBC,  LACTICIDVEN Microbiology Recent Results (from the past 240 hour(s))  Culture, blood (Routine x 2)     Status: Abnormal   Collection Time: 08/12/17 10:30 PM  Result Value Ref Range Status   Specimen Description BLOOD LEFT ANTECUBITAL  Final   Special Requests   Final    BOTTLES DRAWN AEROBIC AND ANAEROBIC Blood Culture adequate volume   Culture  Setup Time   Final    BLOOD AEROBIC BOTTLE GRAM POSITIVE COCCI IN CLUSTERS CRITICAL RESULT CALLED TO, READ BACK BY AND VERIFIED WITH: J LEDFORD [facility] 08/14/17 0036 JDW    Culture (A)  Final  STAPHYLOCOCCUS SPECIES (COAGULASE NEGATIVE) THE SIGNIFICANCE OF ISOLATING THIS ORGANISM FROM A SINGLE SET OF BLOOD CULTURES WHEN MULTIPLE SETS ARE DRAWN IS UNCERTAIN. PLEASE NOTIFY THE MICROBIOLOGY DEPARTMENT WITHIN ONE WEEK IF SPECIATION AND SENSITIVITIES ARE REQUIRED. Performed at [facility], [address], [place], [place] [address]    Report Status 08/15/2017 FINAL  Final  Blood Culture ID Panel (Reflexed)     Status: Abnormal   Collection Time: 08/12/17 10:30 PM  Result Value Ref Range Status   Enterococcus species NOT DETECTED NOT DETECTED Final   Listeria monocytogenes NOT DETECTED NOT DETECTED Final   Staphylococcus species DETECTED (A) NOT DETECTED Final    Comment: Methicillin (oxacillin) susceptible coagulase negative staphylococcus. Possible blood culture contaminant (unless isolated from more than one blood culture draw or clinical case suggests pathogenicity). No antibiotic treatment is indicated for blood  culture contaminants. CRITICAL RESULT CALLED TO, READ BACK BY AND VERIFIED WITH: J LEDFORD PHARMD 08/14/17 0036  JDW    Staphylococcus aureus NOT DETECTED NOT DETECTED Final   Methicillin resistance NOT DETECTED NOT DETECTED Final   Streptococcus species NOT DETECTED NOT DETECTED Final   Streptococcus agalactiae NOT DETECTED NOT DETECTED Final   Streptococcus pneumoniae NOT DETECTED NOT DETECTED Final   Streptococcus pyogenes NOT DETECTED NOT DETECTED Final   Acinetobacter baumannii NOT DETECTED NOT DETECTED Final   Enterobacteriaceae species NOT DETECTED NOT DETECTED Final   Enterobacter cloacae complex NOT DETECTED NOT DETECTED Final   Escherichia coli NOT DETECTED NOT DETECTED Final   Klebsiella oxytoca NOT DETECTED NOT DETECTED Final   Klebsiella pneumoniae NOT DETECTED NOT DETECTED Final   Proteus species NOT DETECTED NOT DETECTED Final   Serratia marcescens NOT DETECTED NOT DETECTED Final   Haemophilus influenzae NOT DETECTED NOT DETECTED Final   Neisseria meningitidis NOT DETECTED NOT DETECTED Final   Pseudomonas aeruginosa NOT DETECTED NOT DETECTED Final   Candida albicans NOT DETECTED NOT DETECTED Final   Candida glabrata NOT DETECTED NOT DETECTED Final   Candida krusei NOT DETECTED NOT DETECTED Final   Candida parapsilosis NOT DETECTED NOT DETECTED Final   Candida tropicalis NOT DETECTED NOT DETECTED Final    Comment: Performed at [facility], [address], [place], [place] [address]  Urine culture     Status: Abnormal   Collection Time: 08/12/17 10:49 PM  Result Value Ref Range Status   Specimen Description URINE, CATHETERIZED  Final   Special Requests   Final    NONE Performed at [facility], [address] [place], [place] [address]    Culture (A)  Final    >=100,000 COLONIES/mL KLEBSIELLA PNEUMONIAE >=100,000 COLONIES/mL ESCHERICHIA COLI    Report Status 08/16/2017 FINAL  Final   Organism ID, Bacteria KLEBSIELLA PNEUMONIAE (A)  Final   Organism ID, Bacteria ESCHERICHIA COLI (A)  Final      Susceptibility   Escherichia coli - MIC*    AMPICILLIN 8 SENSITIVE  Sensitive     CEFAZOLIN <=4 SENSITIVE Sensitive     CEFTRIAXONE <=1 SENSITIVE Sensitive     CIPROFLOXACIN <=0.25 SENSITIVE Sensitive     GENTAMICIN <=1 SENSITIVE Sensitive     IMIPENEM <=0.25 SENSITIVE Sensitive     NITROFURANTOIN <=16 SENSITIVE Sensitive     TRIMETH/SULFA <=20 SENSITIVE Sensitive     AMPICILLIN/SULBACTAM <=2 SENSITIVE Sensitive     PIP/TAZO <=4 SENSITIVE Sensitive     Extended ESBL NEGATIVE Sensitive     * >=100,000 COLONIES/mL ESCHERICHIA COLI   Klebsiella pneumoniae - MIC*  AMPICILLIN >=32 RESISTANT Resistant     CEFAZOLIN <=4 SENSITIVE Sensitive     CEFTRIAXONE <=1 SENSITIVE Sensitive     CIPROFLOXACIN <=0.25 SENSITIVE Sensitive     GENTAMICIN <=1 SENSITIVE Sensitive     IMIPENEM <=0.25 SENSITIVE Sensitive     NITROFURANTOIN 32 SENSITIVE Sensitive     TRIMETH/SULFA <=20 SENSITIVE Sensitive     AMPICILLIN/SULBACTAM 4 SENSITIVE Sensitive     PIP/TAZO <=4 SENSITIVE Sensitive     Extended ESBL NEGATIVE Sensitive     * >=100,000 COLONIES/mL KLEBSIELLA PNEUMONIAE  Culture, blood (Routine x 2)     Status: None   Collection Time: 08/12/17 11:51 PM  Result Value Ref Range Status   Specimen Description BLOOD RIGHT ANTECUBITAL  Final   Special Requests IN PEDIATRIC BOTTLE Blood Culture adequate volume  Final   Culture   Final    NO GROWTH 5 DAYS Performed at [facility], [address], [place], [place] [address]    Report Status 08/18/2017 FINAL  Final  MRSA PCR Screening     Status: None   Collection Time: 08/13/17  6:42 AM  Result Value Ref Range Status   MRSA by PCR NEGATIVE NEGATIVE Final    Comment:        The GeneXpert MRSA Assay (FDA approved for NASAL specimens only), is one component of a comprehensive MRSA colonization surveillance program. It is not intended to diagnose MRSA infection nor to guide or monitor treatment for MRSA infections. Performed at [facility], [address] [place], [place] [address]      Time  coordinating discharge: 32 minutes  SIGNED:   Jacquelin Hawking, MD [facility] 08/19/2017, 12:42 PM Pager [phone]  If 7PM-7AM, please contact night-coverage [email or web address] Password [email or web address]

## 2017-08-20 ENCOUNTER — Telehealth: Payer: Self-pay

## 2017-08-20 ENCOUNTER — Encounter: Payer: Self-pay | Admitting: Adult Health

## 2017-08-20 ENCOUNTER — Non-Acute Institutional Stay (SKILLED_NURSING_FACILITY): Payer: Medicaid Other | Admitting: Adult Health

## 2017-08-20 DIAGNOSIS — I5042 Chronic combined systolic (congestive) and diastolic (congestive) heart failure: Secondary | ICD-10-CM | POA: Diagnosis not present

## 2017-08-20 DIAGNOSIS — E1159 Type 2 diabetes mellitus with other circulatory complications: Secondary | ICD-10-CM | POA: Insufficient documentation

## 2017-08-20 DIAGNOSIS — R627 Adult failure to thrive: Secondary | ICD-10-CM | POA: Diagnosis not present

## 2017-08-20 DIAGNOSIS — E785 Hyperlipidemia, unspecified: Secondary | ICD-10-CM

## 2017-08-20 DIAGNOSIS — M170 Bilateral primary osteoarthritis of knee: Secondary | ICD-10-CM | POA: Diagnosis not present

## 2017-08-20 DIAGNOSIS — F10950 Alcohol use, unspecified with alcohol-induced psychotic disorder with delusions: Secondary | ICD-10-CM

## 2017-08-20 DIAGNOSIS — I825Y2 Chronic embolism and thrombosis of unspecified deep veins of left proximal lower extremity: Secondary | ICD-10-CM

## 2017-08-20 DIAGNOSIS — E1142 Type 2 diabetes mellitus with diabetic polyneuropathy: Secondary | ICD-10-CM

## 2017-08-20 DIAGNOSIS — E1169 Type 2 diabetes mellitus with other specified complication: Secondary | ICD-10-CM

## 2017-08-20 DIAGNOSIS — G2401 Drug induced subacute dyskinesia: Secondary | ICD-10-CM | POA: Diagnosis not present

## 2017-08-20 DIAGNOSIS — T43505A Adverse effect of unspecified antipsychotics and neuroleptics, initial encounter: Secondary | ICD-10-CM

## 2017-08-20 DIAGNOSIS — G8929 Other chronic pain: Secondary | ICD-10-CM | POA: Diagnosis not present

## 2017-08-20 DIAGNOSIS — F1027 Alcohol dependence with alcohol-induced persisting dementia: Secondary | ICD-10-CM | POA: Diagnosis not present

## 2017-08-20 DIAGNOSIS — I10 Essential (primary) hypertension: Secondary | ICD-10-CM

## 2017-08-20 NOTE — Telephone Encounter (Signed)
Possible re-admission to facility. [facility] Hospital F/U is needed if patient was re-admitted to facility upon discharge. [facility]

## 2017-08-20 NOTE — Progress Notes (Signed)
Location:   [facility] Room Number: [place] of Service:  SNF (31)   CODE STATUS: Full Code (Most form updated 12/11/13)  No Known Allergies  Chief Complaint  Patient presents with  . Hospitalization Follow-up    Hospital follow up    HPI:  She is a 62 year old long term resident of this facility being seen for her hospitalization. She has been hospitalized from 08-12-17 through 08-19-17 for septic shock due to uti;she has completed her abt.  her heart failure has remained stable. She would benefit from a palliative care consult. She is unable to fully participate in the hpi or ros. There are no reports of uncontrolled pain; no changes in appetite; no reports of behavioral issues. She will followed for her chronic illnesses including: heart failure; diabetes; dementia. There are no nursing concerns at this time.    Past Medical History:  Diagnosis Date  . Acute deep vein thrombosis (DVT) of left femoral vein ([place]) 02/22/2015  . Alcohol abuse   . Arthritis   . Chronic constipation 08/26/2015  . Dementia with behavioral problem   . Diabetes mellitus without complication ([place])   . Dysphagia    and aspiration risk  . 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 Seizures ([place])     History reviewed. No pertinent surgical history.  Social History   Socioeconomic History  . Marital status: Unknown    Spouse name: Not on file  . Number of children: Not on file  . Years of education: Not on file  . Highest education level: Not on file  Occupational History  . Not on file  Social Needs  . Financial resource strain: Not on file  . Food insecurity:    Worry: Not on file    Inability: Not on file  . Transportation needs:    Medical: Not on file    Non-medical: Not on file  Tobacco Use  . Smoking status: Former Smoker    Packs/day: 0.50    Years: 15.00    Pack years: 7.50    Types: Cigarettes  . Smokeless tobacco: Never Used  Substance and Sexual Activity  .  Alcohol use: Yes    Alcohol/week: 1.2 oz    Types: 2 Cans of beer per week    Comment: occ  . Drug use: No  . Sexual activity: Not Currently  Lifestyle  . Physical activity:    Days per week: Not on file    Minutes per session: Not on file  . Stress: Not on file  Relationships  . Social connections:    Talks on phone: Not on file    Gets together: Not on file    Attends religious service: Not on file    Active member of club or organization: Not on file    Attends meetings of clubs or organizations: Not on file    Relationship status: Not on file  . Intimate partner violence:    Fear of current or ex partner: Not on file    Emotionally abused: Not on file    Physically abused: Not on file    Forced sexual activity: Not on file  Other Topics Concern  . Not on file  Social History Narrative  . Not on file   History reviewed. No pertinent family history.    VITAL SIGNS BP (!) 120/98   Pulse 78   Temp 97.6 F (36.4 C)   Resp 18   Ht 5' 3"  (  1.6 m)   Wt 120 lb 14.4 oz (54.8 kg)   LMP 03/25/2015 (Approximate)   SpO2 95%   BMI 21.42 kg/m   Outpatient Encounter Medications as of 08/20/2017  Medication Sig  . atorvastatin (LIPITOR) 10 MG tablet Take 10 mg by mouth daily.  . benztropine (COGENTIN) 0.5 MG tablet Take 0.5 mg by mouth at bedtime.  . cholecalciferol (VITAMIN D) 1000 UNITS tablet Take 1,000 Units by mouth daily. Reported on 10/28/2015  . Cranberry 400 MG TABS Take 1 tablet by mouth 2 (two) times daily.  . diclofenac sodium (VOLTAREN) 1 % GEL Apply 2 g topically 4 (four) times daily. Apply to both knees topically four times a day for pain.   Marland Kitchen ENSURE (ENSURE) Give 90cc by mouth three times a day in between meals for weight support  . gabapentin (NEURONTIN) 300 MG capsule Take 300 mg by mouth 3 (three) times daily.  Marland Kitchen linaclotide (LINZESS) 72 MCG capsule Take 72 mcg by mouth daily before breakfast.  . linagliptin (TRADJENTA) 5 MG TABS tablet Take 5 mg by mouth  daily.  Marland Kitchen lisinopril (PRINIVIL,ZESTRIL) 2.5 MG tablet Take 2.5 mg by mouth daily. Hold for SBP < 110  . LORazepam (ATIVAN) 0.5 MG tablet Take 1 tablet (0.5 mg total) by mouth 2 (two) times daily.  . Melatonin 3 MG TABS Take 3 mg by mouth at bedtime.   . metFORMIN (GLUCOPHAGE) 500 MG tablet Take 500 mg by mouth daily.  . Multiple Vitamins-Minerals (DECUBI-VITE) CAPS Take 2 capsules by mouth at bedtime.  . Nutritional Supplements (PROMOD) LIQD Give 30cc by mouth two times daily for state 2 on sacrum  . OLANZapine (ZYPREXA) 10 MG tablet Take 10 mg by mouth at bedtime.  . polyethylene glycol (MIRALAX / GLYCOLAX) packet Take 17 g by mouth 2 (two) times daily.  . potassium chloride SA (K-DUR,KLOR-CON) 20 MEQ tablet Take 20 mEq by mouth 2 (two) times daily.   . rivaroxaban (XARELTO) 20 MG TABS tablet Take 1 tablet (20 mg total) by mouth daily with supper.  . senna-docusate (SENOKOT-S) 8.6-50 MG tablet Take 1 tablet by mouth 2 (two) times daily.  Marland Kitchen thiamine 100 MG tablet Take 100 mg by mouth daily.  Marland Kitchen torsemide (DEMADEX) 20 MG tablet Take 20 mg by mouth daily.   . traMADol (ULTRAM) 50 MG tablet Take 1 tablet (50 mg total) by mouth every 6 (six) hours.  . [DISCONTINUED] Amino Acids-Protein Hydrolys (FEEDING SUPPLEMENT, PRO-STAT SUGAR FREE 64,) LIQD Take 30 mLs by mouth 2 (two) times daily.   No facility-administered encounter medications on file as of 08/20/2017.      SIGNIFICANT DIAGNOSTIC EXAMS  PREVIOUS  10-02-14: left leg doppler: +dvt  08-08-16: left lower extremity doppler: + DVT  04-26-17: ct of abdomen and pelvis: 1. Large amount of retained large bowel stool, stool distended rectum associated with fecal impaction. 2. Sequelae of chronic pancreatitis with multiple small pancreatic cysts most compatible with pseudocyst. 3. Cholelithiasis and extra hepatic biliary dilatation without choledocholithiasis or CT findings of acute cholecystitis. 4. Distended urinary bladder seen with neurogenic  bladder/bladder outlet obstruction. Recommend correlation with voiding. 5. 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 Aortic Atherosclerosis  04-26-17: ct of head: No CT evidence for acute intracranial abnormality.   04-28-17: 2-d echo:   - Left ventricle: The cavity size was mildly dilated. Wall thickness was normal. Systolic function was severely reduced. The estimated ejection fraction was in the range of 20% to 25%. Severe diffuse  hypokinesis with regional variations. Possible disproportionately severe hypokinesis of the inferolateral, inferior, and inferoseptal myocardium. Features are consistent with a pseudonormal left ventricular filling pattern, with concomitant abnormal relaxation and increased filling pressure (grade 2 diastolic dysfunction).  04-29-17: ct of chest: 1. There is a 6 mm nodule in the right lung base. Non-contrast chest CT at 6-12 months is recommended. If the nodule is stable at time of repeat CT, then future CT at 18-24 months (from today's scan) is considered optional for low-risk patients, but is recommended for high-risk patients. :  2. No CT correlate for the recent chest x-ray finding in the right mid lung. 3. No other abnormalities.  TODAY:   08-12-17: chest x-ray:  Minimal right base atelectasis. No edema or consolidation. No adenopathy. Cardiac silhouette within normal limits.  08-13-17: renal ultrasound: Unremarkable sonographic appearance of the kidneys.      LABS REVIEWED: PREVIOUS     07-27-16: hgb a1c 7.1; chol 153; ldl 66; trig 164; hdl 55  09-22-16: wbc 12.1; hgb 11.1; hct 34.8; mcv 86.6; plt 282; glucose 106; bun 14.0; creat 0.55; k+ 3.8; na++ 140; ca 10.1; liver normal albumin 4.4  01-04-17: wbc 10.7; hgb 12.3; hct 37.3; mcv 83.6; plt 227; glucose 120; bun 13.6; creat 0.58; k+ 3.7; na++ 140; ca 9.4  01-11-17: BNP 339.10 02-16-17: tsh 1.39; vit B 12: 6761; folic 95.0  93-26-71: hgb a1c 7.8  04-20-17: wbc 12.4; hgb  11.9; hct 35.9; mcv 85.3; plt 268; glucose 136; bun 16.5; creat 0.60 ;k+ 3.3; na++ 138; liver normal albumin 4.1  04-26-17: wbc 18.5; hgb 11.9; hct 35.9; mcv 86.;3 plt 297; glucose 149; bun 24;creat 0.99; k+ 3.3; na++ 139; ca 9.6; liver normal albumin 3.7; ammonia 18; blood culture: no growth 04-27-17: urine culture: e-coli 04-28-17: wbc 12.9; hgb 8.7; hct 26.5; mcv 86.9; plt 206; glucose 110; bun 7; creat 0.61; k+ 3.5; na++ 144; ca 8.6; liver normal albumin 2.7 04-30-17: glucose 110; bun <5; creat 0.63; k+ 3.0; na++ 144; ca 8.6; mag 1.8; vit B 12: 1282; folate 33.0; iron 43; tibc 235. 05-02-17: wbc 12.4; hgb 11.9; hct 35.1; mcv 86.1 plt 453; glucose 120; bun 7.5; creat 0.57; k+ 4.2; na++ 141; ca 9.7; ast 63; alt 62; albumin 4.3;  05-12-17: FOBT: neg 05-17-17: glucose 140; bun 13.5; creat 0.56; k+ 3.8; na++ 137; ca 9.9; liver normal albumin 4.1  06-05-17: FOBT positive; c-diff: neg 06-08-17: wbc 11.0; hgb 10.7; hct 31.8; mcv 83.0; plt 381 07-11-17: wbc 11.6; hgb 11.1; hct 32.5 ;mcv 79.6; plt 397; glucose 90; bun 10.7; creat 0.62; k+ 3.7; na++ 141; ca 9.4; alk phos 134; albumin 4.2; urine culture: 65,000 multiple bacteria    Lymph# 4.2  07-16-17: urine micro-albumin 64.5; micro/creat ratio: 367.8   TODAY:   08-02-17: glucose 114; bun 32.0; creat 1.31; k+ 4.1; na++ 139; ca 9.6 08-12-17: wbc 18,9; hgb 12.0; hct 37.0; mcv 82.8; plt 414; glucose 126; bun 80; creat 4.42; k+ 4.8; na++ 140; ca 9.5; liver normal albumin 3.5; blood culture: no growth: urine culture: klebsiella pneumoniae and e-coli 08-14-17: wbc 13.8; hgb 10.0; hct 31.0; mcv 82.9; plt 335; glucose 106; bun 24; creat 1.09; k+ 3.5; na++ 151; ca 9.1; mag 2.0; phos 1.9 08-17-17: wbc 9.0; hgb 9.5; hct 30.6; mcv 82.3; plt 281; glucose 112; bun <5; creat 0.79; k+ 3.8; na++ 146; ca 8.7; mag 2.2 08-19-17: wbc 12.5; hgb 10.5; hct 33.0; mcv 82.5; plt 305; glucose 115; bun <5; creat 0.76; k+ 2.9; na++ 144; ca 8.7;  mag 1.5     Review of Systems  Unable to perform  ROS: Dementia (confused )    Physical Exam  Constitutional: No distress.  Frail   Neck: No thyromegaly present.  Cardiovascular: Normal rate, regular rhythm and intact distal pulses.  Murmur heard. Pulmonary/Chest: Effort normal. No respiratory distress.  Abdominal: Soft. Bowel sounds are normal. She exhibits no distension. There is no tenderness.  Genitourinary:  Genitourinary Comments: Foley present   Musculoskeletal: She exhibits edema.  Able to move all extremities  Leans to left when in wheelchair    1+ bilateral lower extremity edema   Lymphadenopathy:    She has no cervical adenopathy.  Neurological: She is alert.  Skin: Skin is warm and dry. She is not diaphoretic.  Psychiatric: She has a normal mood and affect.     ASSESSMENT/ PLAN:  TODAY   1. Hypertension associated with diabetes type 2 : is stable b/p 120/98 will continue lisinopril 2.5 mg daily   2. Dyslipidemia associated with diabetes type 2: stable will continue lipitor 10 mg daily  3. Diabetes mellitus with neurological manifestations: without change  hgb a1c is 7.8 (previous 7.1)  Urine for micro-albumin is 7.4;   Will continue metformin xr  500 mg  daily;   tradjenta 5 mg daily and will monitor  is on statin and ace   4 .Chronic pain with osteoarthritis of both knees: she is  getting adequate pain relief: will continue voltaren gel 2 gm to both knees four times daily; will continue neurontin  300 mg three times daily; and will continue ultram  50 mg every 6 hours routinely   5. Right lung nodule: 6 mm will repeat ct scan of chest in June 2019  6. Chronic pancreatitis:  Sequelae of chronic pancreatitis with multiple small pancreatic cysts most compatible with pseudocyst. Will not make changes  7. Neuroleptic-induced tardive dyskinesia: is stable will continue cogentin 0.5 mg nightly   8. Psychosis; due to alcohol: she is presently without change in status; will continue zyprexa 10 mg daily; thiamine and  folic acid daily. Is on ativan 0.5 mg twice daily for anxiety    9. 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 Her current weight is 120 pounds her weight in July 2018: 154 pounds with this disease process weight loss is an expected outcome.  10. Upper leg DVT chronic left:  (08-08-16; #2) stable  will continue xarelto 20 mg nightly she is on long term therapy  will monitor this at this time.      11. Bilateral lower extremity edema: no change in status:  will continue demadex  20 mg daily with k+ 20 meq twice daily   12. Chronic constipation: stable  will continue senna s twice daily; miralax twice daily and  linzess 72 mcg daily   13. Urine retention: has chronic indwelling foley catheter: stable  will have has been seen by urology; will need foley long term    14. Septic shock due to UTI: has completed antibiotics will monitor   MD is aware of resident's narcotic use and is in agreement with current plan of care. We will attempt to wean resident as apropriate    Ok Edwards NP [facility]  Contact [phone] Monday through Friday 8am- 5pm  After hours call [phone]

## 2017-08-21 LAB — BASIC METABOLIC PANEL
BUN: 9 (ref 4–21)
CREATININE: 0.6 (ref 0.5–1.1)
Glucose: 125
Potassium: 5 (ref 3.4–5.3)
SODIUM: 139 (ref 137–147)

## 2017-08-21 LAB — CBC AND DIFFERENTIAL
HEMATOCRIT: 35 — AB (ref 36–46)
HEMOGLOBIN: 11.5 — AB (ref 12.0–16.0)
NEUTROS ABS: 10
PLATELETS: 362 (ref 150–399)
WBC: 14.5

## 2017-08-23 ENCOUNTER — Encounter: Payer: Self-pay | Admitting: Internal Medicine

## 2017-08-23 ENCOUNTER — Non-Acute Institutional Stay (SKILLED_NURSING_FACILITY): Payer: Medicaid Other | Admitting: Internal Medicine

## 2017-08-23 DIAGNOSIS — I825Y2 Chronic embolism and thrombosis of unspecified deep veins of left proximal lower extremity: Secondary | ICD-10-CM

## 2017-08-23 DIAGNOSIS — E1142 Type 2 diabetes mellitus with diabetic polyneuropathy: Secondary | ICD-10-CM | POA: Diagnosis not present

## 2017-08-23 DIAGNOSIS — F10950 Alcohol use, unspecified with alcohol-induced psychotic disorder with delusions: Secondary | ICD-10-CM | POA: Diagnosis not present

## 2017-08-23 DIAGNOSIS — R339 Retention of urine, unspecified: Secondary | ICD-10-CM | POA: Diagnosis not present

## 2017-08-23 DIAGNOSIS — F1027 Alcohol dependence with alcohol-induced persisting dementia: Secondary | ICD-10-CM

## 2017-08-23 DIAGNOSIS — I5042 Chronic combined systolic (congestive) and diastolic (congestive) heart failure: Secondary | ICD-10-CM

## 2017-08-23 NOTE — Progress Notes (Signed)
Patient ID: Sarah Mckay, female   DOB: 07-24-1955, 62 y.o.   MRN: [id number]  Provider:  DR Elmon Kirschner Location:  [facility] Room Number: 204 B Place of Service:  SNF (31)  PCP: Kirt Boys, DO Patient Care Team: Kirt Boys, DO as PCP - General (Internal Medicine) [place] Si Chong Sicilian, NP as Nurse Practitioner (Nurse Practitioner) [facility] (Skilled Nursing Facility)  Extended Emergency Contact Information Primary Emergency Contact: Savo,Marcus POA Address: [address]          [place], [place] [address] [place] Home Phone: [phone] Mobile Phone: [phone] Relation: Brother Secondary Emergency Contact: Dewaine Oats [place] Home Phone: [phone] Relation: Niece  Code Status:   Full Code Goals of Care: Advanced Directive information Advanced Directives 08/23/2017  Does Patient Have a Medical Advance Directive? Yes  Type of Advance Directive -  Does patient want to make changes to medical advance directive? No - Patient declined  Copy of Healthcare Power of Attorney in Chart? -  Would patient like information on creating a medical advance directive? No - Patient declined  Pre-existing out of facility DNR order (yellow form or pink MOST form) Pink MOST form placed in chart (order not valid for inpatient use)      Chief Complaint  Patient presents with  . Readmit To SNF    Readmission    HPI: Patient is a 62 y.o. female seen today for re-admission to SNF following hospital stay for septic shock. She was tx with levophed gtt for hypotension and given IV abx vanco/zosyn as well as 4L NSS IVF. Levophed eventually weaned off. BC NGTD. Urine cx (+) E coli and Kleb pneumoniae. Vanco/zosyn--> IV rocephin x 5 days. IV solu-cortef weaned off. Foley cath changed prior to d/c. Palliative care consulted but was unable to meet with family. She presents to SNF to continue long term care.  Today she  reports weakness. No other concerns. Appetite reduced. Sleeps well. She is a poor historian due to psych d/o. Hx obtained from chart.  Hypertension  - stable on lisinopril 2.5 mg daily   Dyslipidemia - stable on lipitor 10 mg daily. LDL 81  DM - borderline controlled. A1c 7.2%. She takes metformin xr  500 mg  daily; tradjenta 5 mg daily; statin and ACEI. 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 She does benefit from this regimen. followed by psych services  Vascular/Etoh induced dementia - currently stable off cognitive medications. Weight slowly trending down and loss is an expected outcome. She gets nutritional supplements. 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 Acute deep vein thrombosis (DVT) of left femoral vein (HCC) 02/22/2015  . Alcohol abuse   . Arthritis   . Chronic constipation 08/26/2015  . Dementia with behavioral problem   . Diabetes mellitus without complication  (HCC)   . Dysphagia    and aspiration risk  . 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 Seizures (HCC)    History reviewed. No pertinent surgical history.  reports that she has quit smoking. Her smoking use included cigarettes. She has a 7.50 pack-year smoking history. She has never used smokeless tobacco. She reports that she drinks about 1.2 oz of alcohol per week. She reports that she does not use drugs. Social History   Socioeconomic History  . Marital status: Unknown    Spouse name: Not on file  . Number of children: Not on file  . Years of education: Not on file  . Highest education level: Not on file  Occupational History  . Not on file  Social Needs  . Financial resource strain: Not on file  . Food insecurity:    Worry: Not on file    Inability: Not on file  . Transportation needs:    Medical: Not on file    Non-medical: Not on file  Tobacco Use  . Smoking status: Former Smoker    Packs/day: 0.50    Years: 15.00    Pack years: 7.50    Types: Cigarettes  . Smokeless tobacco: Never Used  Substance and Sexual Activity  . Alcohol use: Yes    Alcohol/week: 1.2 oz    Types: 2 Cans of beer per week    Comment: occ  . Drug use: No  . Sexual activity: Not Currently  Lifestyle  . Physical activity:    Days per week: Not on file    Minutes per session: Not on file  . Stress: Not on file  Relationships  . Social connections:    Talks on phone: Not on file    Gets together: Not on file    Attends religious service: Not on file    Active member of club or organization: Not on file    Attends meetings of clubs or organizations: Not on file    Relationship status: Not on file  . Intimate partner violence:    Fear of current or ex partner: Not on file    Emotionally abused: Not on file    Physically abused: Not on file    Forced sexual activity: Not on file  Other Topics Concern  . Not on file  Social History Narrative  . Not on file    Functional Status Survey:     History reviewed. No pertinent family history.  Health Maintenance  Topic Date Due  . OPHTHALMOLOGY EXAM  10/19/2017 (Originally 09/11/2017)  . HEMOGLOBIN A1C  12/31/2017  . FOOT EXAM  01/09/2018  . PNEUMOCOCCAL POLYSACCHARIDE VACCINE (2) 06/17/2019  . TETANUS/TDAP  01/19/2024  . INFLUENZA VACCINE  Completed  . Hepatitis C Screening  Completed  . HIV Screening  Completed  . PAP SMEAR  Discontinued    No Known Allergies  Outpatient Encounter Medications as of 08/23/2017  Medication Sig  . atorvastatin (LIPITOR) 10 MG tablet Take 10 mg by  mouth daily.  . benztropine (COGENTIN) 0.5 MG tablet Take 0.5 mg by mouth at bedtime.  . cholecalciferol (VITAMIN D) 1000 UNITS tablet Take 1,000 Units by mouth daily. Reported on 10/28/2015  . Cranberry 400 MG TABS Take 1 tablet by mouth 2 (two) times daily.  . diclofenac sodium (VOLTAREN) 1 % GEL Apply 2 g topically 4 (four) times daily. Apply to both knees topically four times a day for pain.   Marland Kitchen ENSURE (ENSURE) Give 90cc by mouth three times a day in between meals for weight support  . gabapentin (NEURONTIN) 300 MG capsule Take 300 mg by mouth 3 (three) times daily.  Marland Kitchen linaclotide (LINZESS) 72 MCG capsule Take 72 mcg by mouth daily before breakfast.  . linagliptin (TRADJENTA) 5 MG TABS tablet Take 5 mg by mouth daily.  Marland Kitchen lisinopril (PRINIVIL,ZESTRIL) 2.5 MG tablet Take 2.5 mg by mouth daily. Hold for SBP < 110  . LORazepam (ATIVAN) 0.5 MG tablet Take 1 tablet (0.5 mg total) by mouth 2 (two) times daily.  . Melatonin 3 MG TABS Take 3 mg by mouth at bedtime.   . metFORMIN (GLUCOPHAGE) 500 MG tablet Take 500 mg by mouth daily.  . Multiple Vitamins-Minerals (DECUBI-VITE) CAPS Take 2 capsules by mouth at bedtime.  . Nutritional Supplements (PROMOD) LIQD Give 30cc by mouth two times daily for state 2 on sacrum  . OLANZapine (ZYPREXA) 10 MG tablet Take 10 mg by mouth at bedtime.  . polyethylene glycol (MIRALAX / GLYCOLAX) packet Take 17 g by mouth 2  (two) times daily.  . potassium chloride SA (K-DUR,KLOR-CON) 20 MEQ tablet Take 20 mEq by mouth 2 (two) times daily.   . rivaroxaban (XARELTO) 20 MG TABS tablet Take 1 tablet (20 mg total) by mouth daily with supper.  . senna-docusate (SENOKOT-S) 8.6-50 MG tablet Take 1 tablet by mouth 2 (two) times daily.  Marland Kitchen thiamine 100 MG tablet Take 100 mg by mouth daily.  Marland Kitchen torsemide (DEMADEX) 20 MG tablet Take 20 mg by mouth daily.   . traMADol (ULTRAM) 50 MG tablet Take 1 tablet (50 mg total) by mouth every 6 (six) hours.   No facility-administered encounter medications on file as of 08/23/2017.     Review of Systems  Unable to perform ROS: Psychiatric disorder    Vitals:   08/23/17 1310  BP: 122/75  Pulse: 80  Resp: 16  Temp: 98.7 F (37.1 C)  SpO2: 97%  Weight: 120 lb 14.4 oz (54.8 kg)  Height: 5\' 3"  (1.6 m)   Body mass index is 21.42 kg/m. Physical Exam  Constitutional: She appears well-developed and well-nourished.  Frail appearing sitting in w/c in AND; looks pale  HENT:  Mouth/Throat: Oropharynx is clear and moist. No oropharyngeal exudate.  MMM; no oral thrush  Eyes: Pupils are equal, round, and reactive to light. No scleral icterus.  Neck: Neck supple. Carotid bruit is not present. No tracheal deviation present. No thyromegaly present.  Cardiovascular: Regular rhythm and intact distal pulses. Tachycardia present. Exam reveals no gallop and no friction rub.  Murmur heard.  Systolic murmur is present with a grade of 1/6. Trace L>RLE edema; no calf TTP  Pulmonary/Chest: Effort normal and breath sounds normal. No stridor. No respiratory distress. She has no wheezes. She has no rales.  Abdominal: Soft. Normal appearance and bowel sounds are normal. She exhibits distension. She exhibits no mass. There is no hepatomegaly. There is no tenderness. There is no rigidity, no rebound and no guarding. No hernia.  Genitourinary:  Genitourinary Comments: Foley cath intact and DTG clear yellow  urine  Musculoskeletal: She exhibits edema (small and large joints).  Lymphadenopathy:    She has no cervical adenopathy.  Neurological: She is alert.  Skin: Skin is warm and dry. No rash noted.  Psychiatric: She has a normal mood and affect. Her behavior is normal. Thought content normal.    Labs reviewed: Basic Metabolic Panel: Recent Labs    08/13/17 0340  08/14/17 0229 08/15/17 0244  08/17/17 0657 08/18/17 0721 08/19/17 0649 08/21/17  NA 143   < > 151* 150*   < > 146* 142 144 139  K 4.0   < > 3.5 3.3*   < > 3.8 3.4* 2.9* 5.0  CL 112*   < > 111 113*   < > 113* 106 106  --   CO2 22   < > 23 24   < > 22 24 27   --   GLUCOSE 124*   < > 106* 137*   < > 112* 112* 115*  --   BUN 64*   < > 24* 12   < > <5* <5* <5* 9  CREATININE 3.20*   < > 1.09* 0.88   < > 0.79 0.74 0.76 0.6  CALCIUM 7.6*   < > 9.1 8.7*   < > 8.7* 8.3* 8.7*  --   MG 1.7  --  2.0 1.6*   < > 2.2 1.7 1.5*  --   PHOS 4.6  --  1.9* 2.5  --   --   --   --   --    < > = values in this interval not displayed.   Liver Function Tests: Recent Labs    04/26/17 1937 04/28/17 0253  05/17/17 07/11/17 08/12/17 2231  AST 36 22   < > 23 22 26   ALT 24 21   < > 32 16 24  ALKPHOS 84 57   < > 112 134* 92  BILITOT 0.5 0.5  --   --   --  0.4  PROT 8.8* 5.9*  --   --   --  9.4*  ALBUMIN 3.7 2.7*  --   --   --  3.5   < > = values in this interval not displayed.   Recent Labs    04/28/17 0253  LIPASE 16   Recent Labs    04/26/17 1937 04/28/17 0253  AMMONIA 18 14   CBC: Recent Labs    07/11/17 08/12/17 2231  08/17/17 0657 08/18/17 0721 08/19/17 0649 08/21/17  WBC 11.6 18.9*   < > 9.0 11.0* 12.5* 14.5  NEUTROABS 6 13.7*  --   --   --   --  10  HGB 11.1* 12.0   < > 9.5* 9.9* 10.5* 11.5*  HCT 33* 37.0   < > 30.6* 31.1* 33.0* 35*  MCV  --  82.8   < > 82.3 81.4 82.5  --   PLT 397 414*   < > 281 274 305 362   < > = values in this interval not displayed.   Cardiac Enzymes: No results for input(s): CKTOTAL, CKMB,  CKMBINDEX, TROPONINI in the last 8760 hours. BNP: Invalid input(s): POCBNP Lab Results  Component Value Date   HGBA1C 7.2 07/03/2017   Lab Results  Component Value Date   TSH 1.39 02/16/2017   Lab Results  Component Value Date   VITAMINB12 1,282 (H) 04/30/2017   Lab Results  Component Value Date   FOLATE 33.0  04/30/2017   Lab Results  Component Value Date   IRON 43 04/30/2017   TIBC 235 (L) 04/30/2017    Imaging and Procedures obtained prior to SNF admission: Dg Chest 2 View  Result Date: 08/12/2017 CLINICAL DATA:  Generalized weakness EXAM: CHEST - 2 VIEW COMPARISON:  Chest CT April 29, 2017 and chest radiograph April 26, 2017 FINDINGS: There is no edema or consolidation. There is minimal right base atelectasis. Heart size and pulmonary vascularity are normal. No adenopathy. No evident bone lesions. IMPRESSION: Minimal right base atelectasis. No edema or consolidation. No adenopathy. Cardiac silhouette within normal limits. Electronically Signed   By: Bretta Bang III M.D.   On: 08/12/2017 23:37   US Renal  Result Date: 08/13/2017 CLINICAL DATA:  Acute kidney failure. EXAM: RENAL / URINARY TRACT ULTRASOUND COMPLETE COMPARISON:  CT 04/26/2017 FINDINGS: Right Kidney: Length: 10.7 cm. Echogenicity within normal limits. No mass or hydronephrosis visualized. Left Kidney: Length: 10.7 cm. Echogenicity within normal limits. No mass or hydronephrosis visualized. Bladder: Decompressed by Foley catheter. Gallstones are incidentally noted. IMPRESSION: Unremarkable sonographic appearance of the kidneys. Electronically Signed   By: Rubye Oaks M.D.   On: 08/13/2017 04:05    Assessment/Plan   ICD-10-CM   1. Urine retention R33.9   2. Type 2 diabetes mellitus with diabetic polyneuropathy, without long-term current use of insulin (HCC) E11.42   3. Alcohol-induced psychosis, with delusions (HCC) F10.950   4. Upper leg DVT (deep venous thromboembolism), chronic, left (HCC)  I82.5Y2   5. 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 Chronic combined systolic and diastolic heart failure (HCC) I50.42     Cont current meds as ordered  Foley cath care as indicated  PT/OT/ST as indicated  F/u with specialists as indicated  Cont nutritional supplements per facility protocol  GOAL: long term care. Communicated with pt and nursing.  Labs/tests ordered: none    Bianca Vester S. Ancil Linsey  [facility] [address] [place], [place] [address] [phone] Cell (Monday-Friday 8 AM - 5 PM) [phone] After 5 PM and follow prompts

## 2017-08-24 ENCOUNTER — Other Ambulatory Visit: Payer: Self-pay

## 2017-08-24 MED ORDER — TRAMADOL HCL 50 MG PO TABS: 50.0000 mg | ORAL_TABLET | Freq: Four times a day (QID) | ORAL | 0 refills | Status: DC

## 2017-08-24 NOTE — Telephone Encounter (Signed)
RX faxed to [facility] @ [phone], phone number [phone] 

## 2017-08-27 LAB — HEPATIC FUNCTION PANEL
ALT: 16 (ref 7–35)
AST: 20 (ref 13–35)
Alkaline Phosphatase: 93 (ref 25–125)
Bilirubin, Total: 0.3

## 2017-08-27 LAB — BASIC METABOLIC PANEL
BUN: 17 (ref 4–21)
Creatinine: 1.6 — AB (ref 0.5–1.1)
Glucose: 105
POTASSIUM: 4 (ref 3.4–5.3)
Sodium: 135 — AB (ref 137–147)

## 2017-08-30 ENCOUNTER — Encounter: Payer: Self-pay | Admitting: Internal Medicine

## 2017-08-30 NOTE — Progress Notes (Signed)
Patient ID: Sarah Mckay, female   DOB: 18-Jan-1956, 62 y.o.   MRN: [id number]  Location:  [facility]   Place of Service:  SNF (31) Provider:  DR Merri Dimaano Ivan Croft, DO  Patient Care Team: Kirt Boys, DO as PCP - General (Internal Medicine) Chilton Si Chong Sicilian, NP as Nurse Practitioner (Nurse Practitioner) [facility] (Skilled Nursing Facility)  Extended Emergency Contact Information Primary Emergency Contact: Pinsky,Marcus POA Address: [address]          [place], [place] [address] [place] Home Phone: [phone] Mobile Phone: [phone] Relation: Brother Secondary Emergency Contact: Dewaine Oats [place] Home Phone: [phone] Relation: Niece   Goals of care: Advanced Directive information Advanced Directives 08/23/2017  Does Patient Have a Medical Advance Directive? Yes  Type of Advance Directive -  Does patient want to make changes to medical advance directive? No - Patient declined  Copy of Healthcare Power of Attorney in Chart? -  Would patient like information on creating a medical advance directive? No - Patient declined  Pre-existing out of facility DNR order (yellow form or pink MOST form) Pink MOST form placed in chart (order not valid for inpatient use)     Chief Complaint  Patient presents with  . Medical Management of Chronic Issues    no concerns    HPI:  Pt is a 62 y.o. female seen today for medical management of chronic diseases.  She has no concerns today. No f/c. No falls. She is a poor historian due to dementia. Hx obtained form chart.   DM - borderline controlled. A1c 7.8%;  Urine microalbumin 7.4;  She takes metformin xr  500 mg  daily;  tradjenta 5 mg daily. 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 Currently not on a prn benzo at this time.   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 She does not take any medications. Weight has progressively declined over the last several mos. Unfortunately, with this disease process, weight loss is an expected outcome. She gets nutritional supplements per facility protocol. Albumin 2.7 . She gets thiamine daily.  Hx DVT left leg - dx 08-08-16 and is 2nd DVT. She is stable on long term xarelto 20 mg nightly       Bilateral lower extremity edema - stable on demadex  20 mg daily with k+ 20 meq twice daily   Constipation - stable on senna twice daily; miralax twice daily; linzess 72 mcg daily   Urine retention- s/p chronic foley cath; followed by urology  Right lung nodule -solitary 6 mm nodule. Repeat CT chest due in June 2019  Chronic pancreatitis - imaging has revealed multiple small pancreatic cysts most compatible with pseudocyst. stable  Neuroleptic-induced tardive dyskinesia - stable on cogentin 0.5 mg nightly   Anemia of chronic disease - stable. Hgb 10.7  Past Medical History:  Diagnosis Date  . Acute deep vein thrombosis (DVT) of left femoral vein (HCC) 02/22/2015  . Alcohol abuse   . Arthritis   . Chronic constipation 08/26/2015  . Dementia with behavioral problem   . Diabetes mellitus without  complication (HCC)   . Dysphagia    and aspiration risk  . 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 Seizures (HCC)    History reviewed. No pertinent surgical history.  No Known Allergies  Outpatient Encounter Medications as of 07/02/2017  Medication Sig  . benztropine (COGENTIN) 0.5 MG tablet Take 0.5 mg by mouth at bedtime.  . cholecalciferol (VITAMIN D) 1000 UNITS tablet Take 1,000 Units by mouth daily. Reported on 10/28/2015  . linaclotide (LINZESS) 72 MCG capsule Take 72 mcg  by mouth daily before breakfast.  . linagliptin (TRADJENTA) 5 MG TABS tablet Take 5 mg by mouth daily.  . Melatonin 3 MG TABS Take 3 mg by mouth at bedtime.   . metFORMIN (GLUCOPHAGE) 500 MG tablet Take 500 mg by mouth daily.  . Multiple Vitamins-Minerals (DECUBI-VITE) CAPS Take 2 capsules by mouth at bedtime.  . polyethylene glycol (MIRALAX / GLYCOLAX) packet Take 17 g by mouth 2 (two) times daily.  . potassium chloride SA (K-DUR,KLOR-CON) 20 MEQ tablet Take 20 mEq by mouth 2 (two) times daily.   Marland Kitchen senna-docusate (SENOKOT-S) 8.6-50 MG tablet Take 1 tablet by mouth 2 (two) times daily.  Marland Kitchen thiamine 100 MG tablet Take 100 mg by mouth daily.  Marland Kitchen torsemide (DEMADEX) 20 MG tablet Take 20 mg by mouth daily.   . [DISCONTINUED] Amino Acids-Protein Hydrolys (FEEDING SUPPLEMENT, PRO-STAT SUGAR FREE 64,) LIQD Take 30 mLs by mouth 2 (two) times daily.  . [DISCONTINUED] Nutritional Supplements (NUTRITIONAL SUPPLEMENT PO) House Supplement - Give house supplement with meals daily for caloric and weight support  . [DISCONTINUED] OLANZapine (ZYPREXA) 7.5 MG tablet Take 7.5 mg by mouth at bedtime.   . [DISCONTINUED] rivaroxaban (XARELTO) 20 MG TABS tablet Take 20 mg by mouth at bedtime.  . [DISCONTINUED] traMADol (ULTRAM) 50 MG tablet Take 1 tablet (50 mg total) by mouth every 6 (six) hours.  . [DISCONTINUED] diclofenac sodium (VOLTAREN) 1 % GEL Apply 2 g topically 4 (four) times daily. To both knees  . [DISCONTINUED] gabapentin (NEURONTIN) 300 MG capsule Take 3 capsules (900 mg total) by mouth 3 (three) times daily.   No facility-administered encounter medications on file as of 07/02/2017.     Review of Systems  Unable to perform ROS: Dementia (and psych d/o)    Immunization History  Administered Date(s) Administered  . Influenza-Unspecified 04/29/2014, 07/06/2015, 03/02/2016, 03/29/2017  . PPD Test 02/11/2016, 02/18/2016  . Pneumococcal-Unspecified 06/16/2014  . Tdap 01/18/2014   Pertinent  Health  Maintenance Due  Topic Date Due  . OPHTHALMOLOGY EXAM  10/19/2017 (Originally 09/11/2017)  . INFLUENZA VACCINE  12/27/2017  . HEMOGLOBIN A1C  12/31/2017  . FOOT EXAM  01/09/2018  . PAP SMEAR  Discontinued   Fall Risk  08/19/2017  Falls in the past year? No  Risk for fall due to : Impaired balance/gait;Mental status change   Functional Status Survey:    Vitals:   07/02/17 0027  BP: 109/68  Pulse: 100  SpO2: 97%   There is no height or weight on file to calculate BMI. Physical Exam  Constitutional: She appears well-developed.  Frail appearing in NAD, sitting in w/c  HENT:  Mouth/Throat: Oropharynx is clear and moist. No oropharyngeal exudate.  MMM; no oral thrush  Eyes: Pupils are equal, round, and reactive to light. No scleral icterus.  Neck: Neck supple. Carotid bruit is not present. No tracheal deviation present. No thyromegaly present.  Cardiovascular: Normal rate, regular rhythm and intact distal pulses. Exam reveals no gallop and no friction  rub.  Murmur (1/6 SEM) heard. No LE edema b/l. no calf TTP.   Pulmonary/Chest: Effort normal and breath sounds normal. No stridor. No respiratory distress. She has no wheezes. She has no rales.  Abdominal: Soft. Normal appearance and bowel sounds are normal. She exhibits no distension and no mass. There is no hepatomegaly. There is no tenderness. There is no rigidity, no rebound and no guarding. No hernia.  Genitourinary:  Genitourinary Comments: Foley intact and DTG clear yellow urine  Musculoskeletal: She exhibits edema and deformity (left TMA).  Lymphadenopathy:    She has no cervical adenopathy.  Neurological: She is alert.  Skin: Skin is warm and dry. No rash noted.  Psychiatric: She has a normal mood and affect. Her behavior is normal.    Labs reviewed: Recent Labs    08/13/17 0340  08/14/17 0229 08/15/17 0244  08/17/17 0657 08/18/17 0721 08/19/17 0649 08/21/17 08/27/17  NA 143   < > 151* 150*   < > 146* 142 144 139  135*  K 4.0   < > 3.5 3.3*   < > 3.8 3.4* 2.9* 5.0 4.0  CL 112*   < > 111 113*   < > 113* 106 106  --   --   CO2 22   < > 23 24   < > 22 24 27   --   --   GLUCOSE 124*   < > 106* 137*   < > 112* 112* 115*  --   --   BUN 64*   < > 24* 12   < > <5* <5* <5* 9 17  CREATININE 3.20*   < > 1.09* 0.88   < > 0.79 0.74 0.76 0.6 1.6*  CALCIUM 7.6*   < > 9.1 8.7*   < > 8.7* 8.3* 8.7*  --   --   MG 1.7  --  2.0 1.6*   < > 2.2 1.7 1.5*  --   --   PHOS 4.6  --  1.9* 2.5  --   --   --   --   --   --    < > = values in this interval not displayed.   Recent Labs    04/26/17 1937 04/28/17 0253  07/11/17 08/12/17 2231 08/27/17  AST 36 22   < > 22 26 20   ALT 24 21   < > 16 24 16   ALKPHOS 84 57   < > 134* 92 93  BILITOT 0.5 0.5  --   --  0.4  --   PROT 8.8* 5.9*  --   --  9.4*  --   ALBUMIN 3.7 2.7*  --   --  3.5  --    < > = values in this interval not displayed.   Recent Labs    07/11/17 08/12/17 2231  08/17/17 0657 08/18/17 0721 08/19/17 0649 08/21/17  WBC 11.6 18.9*   < > 9.0 11.0* 12.5* 14.5  NEUTROABS 6 13.7*  --   --   --   --  10  HGB 11.1* 12.0   < > 9.5* 9.9* 10.5* 11.5*  HCT 33* 37.0   < > 30.6* 31.1* 33.0* 35*  MCV  --  82.8   < > 82.3 81.4 82.5  --   PLT 397 414*   < > 281 274 305 362   < > = values in this interval not displayed.   Lab Results  Component Value Date   TSH 1.39 02/16/2017  Lab Results  Component Value Date   HGBA1C 7.2 07/03/2017   Lab Results  Component Value Date   CHOL 132 07/16/2017   HDL 56 07/16/2017   LDLCALC 60 07/16/2017   TRIG 81 07/16/2017    Significant Diagnostic Results in last 30 days:  Dg Chest 2 View  Result Date: 08/12/2017 CLINICAL DATA:  Generalized weakness EXAM: CHEST - 2 VIEW COMPARISON:  Chest CT April 29, 2017 and chest radiograph April 26, 2017 FINDINGS: There is no edema or consolidation. There is minimal right base atelectasis. Heart size and pulmonary vascularity are normal. No adenopathy. No evident bone lesions.  IMPRESSION: Minimal right base atelectasis. No edema or consolidation. No adenopathy. Cardiac silhouette within normal limits. Electronically Signed   By: Bretta Bang III M.D.   On: 08/12/2017 23:37   US Renal  Result Date: 08/13/2017 CLINICAL DATA:  Acute kidney failure. EXAM: RENAL / URINARY TRACT ULTRASOUND COMPLETE COMPARISON:  CT 04/26/2017 FINDINGS: Right Kidney: Length: 10.7 cm. Echogenicity within normal limits. No mass or hydronephrosis visualized. Left Kidney: Length: 10.7 cm. Echogenicity within normal limits. No mass or hydronephrosis visualized. Bladder: Decompressed by Foley catheter. Gallstones are incidentally noted. IMPRESSION: Unremarkable sonographic appearance of the kidneys. Electronically Signed   By: Rubye Oaks M.D.   On: 08/13/2017 04:05    Assessment/Plan   ICD-10-CM   1. Failure to thrive in adult R62.7   2. Alcohol-induced psychosis with complication (HCC) F10.959   3. Vascular dementia without behavioral disturbance F01.50   4. Urine retention R33.9   5. History of DVT (deep vein thrombosis) Z86.718   6. High risk medication use Z79.899   7. Bilateral lower extremity edema R60.0   8. Severe protein-calorie malnutrition (HCC) E43     Cont current meds as ordered  Foley cath care as indicated  F/u with urology as scheduled  Psych services to follow  Cont nutritional supplements as ordered  PT/OT/ST as indicated  Will follow  Labs/tests ordered: none   Toussaint Golson S. Ancil Linsey  [facility] [address] [place], [place] [address] [phone] Cell (Monday-Friday 8 AM - 5 PM) [phone] After 5 PM and follow prompts

## 2017-09-02 DIAGNOSIS — Z79899 Other long term (current) drug therapy: Secondary | ICD-10-CM | POA: Insufficient documentation

## 2017-09-02 DIAGNOSIS — E43 Unspecified severe protein-calorie malnutrition: Secondary | ICD-10-CM | POA: Insufficient documentation

## 2017-09-04 ENCOUNTER — Encounter: Payer: Self-pay | Admitting: Adult Health

## 2017-09-04 ENCOUNTER — Non-Acute Institutional Stay (SKILLED_NURSING_FACILITY): Payer: Medicaid Other | Admitting: Adult Health

## 2017-09-04 DIAGNOSIS — E1169 Type 2 diabetes mellitus with other specified complication: Secondary | ICD-10-CM | POA: Diagnosis not present

## 2017-09-04 DIAGNOSIS — E1142 Type 2 diabetes mellitus with diabetic polyneuropathy: Secondary | ICD-10-CM

## 2017-09-04 DIAGNOSIS — M170 Bilateral primary osteoarthritis of knee: Secondary | ICD-10-CM

## 2017-09-04 DIAGNOSIS — G8929 Other chronic pain: Secondary | ICD-10-CM | POA: Diagnosis not present

## 2017-09-04 DIAGNOSIS — I10 Essential (primary) hypertension: Secondary | ICD-10-CM

## 2017-09-04 DIAGNOSIS — E1159 Type 2 diabetes mellitus with other circulatory complications: Secondary | ICD-10-CM | POA: Diagnosis not present

## 2017-09-04 DIAGNOSIS — I152 Hypertension secondary to endocrine disorders: Secondary | ICD-10-CM

## 2017-09-04 DIAGNOSIS — E785 Hyperlipidemia, unspecified: Secondary | ICD-10-CM

## 2017-09-04 NOTE — Progress Notes (Signed)
Location:   [facility] Room Number: 204 B Place of Service:  SNF (31)   CODE STATUS: Full Code (Most form updated 12-11-13)  No Known Allergies  Chief Complaint  Patient presents with  . Medical Management of Chronic Issues    Hypertension; dyslipidemia; diabetes; osteoarthritis; chronic pain; polyneuropathy weekly management for her first 30 days post hospitalization.     HPI:  She is a 62 year old long term resident of this facility being seen for the management of her chronic illnesses: hypertension; dyslipidemia; diabetes; osteoarthritis; chronic pain. She is unable to fully participate in the hpi or ros. There are on reports of changes in appetite; her pain is being managed; no reports of excessive thirst. There are no nursing concerns at this time.    Past Medical History:  Diagnosis Date  . Acute deep vein thrombosis (DVT) of left femoral vein ([place]) 02/22/2015  . Alcohol abuse   . Arthritis   . Chronic constipation 08/26/2015  . Dementia with behavioral problem   . Diabetes mellitus without complication ([place])   . Dysphagia    and aspiration risk  . 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 ([place])   . Seizures ([place])     History reviewed. No pertinent surgical history.  Social History   Socioeconomic History  . Marital status: Unknown    Spouse name: Not on file  . Number of children: Not on file  . Years of education: Not on file  . Highest education level: Not on file  Occupational History  . Not on file  Social Needs  . Financial resource strain: Not on file  . Food insecurity:    Worry: Not on file    Inability: Not on file  . Transportation needs:    Medical: Not on file    Non-medical: Not on file  Tobacco Use  . Smoking status: Former Smoker    Packs/day: 0.50    Years: 15.00    Pack years: 7.50    Types: Cigarettes  . Smokeless tobacco: Never Used  Substance and Sexual Activity  . Alcohol use: Yes    Alcohol/week: 1.2 oz    Types: 2 Cans of  beer per week    Comment: occ  . Drug use: No  . Sexual activity: Not Currently  Lifestyle  . Physical activity:    Days per week: Not on file    Minutes per session: Not on file  . Stress: Not on file  Relationships  . Social connections:    Talks on phone: Not on file    Gets together: Not on file    Attends religious service: Not on file    Active member of club or organization: Not on file    Attends meetings of clubs or organizations: Not on file    Relationship status: Not on file  . Intimate partner violence:    Fear of current or ex partner: Not on file    Emotionally abused: Not on file    Physically abused: Not on file    Forced sexual activity: Not on file  Other Topics Concern  . Not on file  Social History Narrative  . Not on file   History reviewed. No pertinent family history.    VITAL SIGNS BP 124/76   Pulse 74   Temp 98.7 F (37.1 C)   Resp 16   Ht _0  (1.6 m)   Wt 122 lb 8 oz (55.6 kg)   LMP 03/25/2015 (Approximate)  SpO2 98%   BMI 21.70 kg/m   Outpatient Encounter Medications as of 09/04/2017  Medication Sig  . atorvastatin (LIPITOR) 10 MG tablet Take 10 mg by mouth daily.  . benztropine (COGENTIN) 0.5 MG tablet Take 0.5 mg by mouth at bedtime.  . cholecalciferol (VITAMIN D) 1000 UNITS tablet Take 1,000 Units by mouth daily. Reported on 10/28/2015  . Cranberry 400 MG TABS Take 1 tablet by mouth 2 (two) times daily.  . diclofenac sodium (VOLTAREN) 1 % GEL Apply 2 g topically 4 (four) times daily. Apply to both knees topically four times a day for pain.   . divalproex (DEPAKOTE SPRINKLE) 125 MG capsule Take 125 mg by mouth 2 (two) times daily.  Marland Kitchen ENSURE (ENSURE) Give 120cc by mouth three times a day in between meals for weight support  . gabapentin (NEURONTIN) 300 MG capsule Take 300 mg by mouth 3 (three) times daily.  Marland Kitchen linaclotide (LINZESS) 72 MCG capsule Take 72 mcg by mouth daily before breakfast.  . linagliptin (TRADJENTA) 5 MG TABS tablet  Take 5 mg by mouth daily.  Marland Kitchen lisinopril (PRINIVIL,ZESTRIL) 2.5 MG tablet Take 2.5 mg by mouth daily. Hold for SBP < 110  . LORazepam (ATIVAN) 0.5 MG tablet Take 1 tablet (0.5 mg total) by mouth 2 (two) times daily.  . Melatonin 3 MG TABS Take 3 mg by mouth at bedtime.   . metFORMIN (GLUCOPHAGE) 500 MG tablet Take 500 mg by mouth daily.  . Multiple Vitamins-Minerals (DECUBI-VITE) CAPS Take 1 capsule by mouth at bedtime.   . Nutritional Supplements (NUTRITIONAL SUPPLEMENT PO) Regular Diet - Pureed texture, Thin liquids consistency.  Patient may have mech soft sandwiches and mech soft snacks  . Nutritional Supplements (PROMOD) LIQD Give 30cc by mouth two times daily for state 2 on sacrum  . OLANZapine (ZYPREXA) 10 MG tablet Take 10 mg by mouth at bedtime.  . polyethylene glycol (MIRALAX / GLYCOLAX) packet Take 17 g by mouth 2 (two) times daily.  . potassium chloride SA (K-DUR,KLOR-CON) 20 MEQ tablet Take 20 mEq by mouth 2 (two) times daily.   . rivaroxaban (XARELTO) 20 MG TABS tablet Take 1 tablet (20 mg total) by mouth daily with supper.  . senna-docusate (SENOKOT-S) 8.6-50 MG tablet Take 1 tablet by mouth 2 (two) times daily.  Marland Kitchen thiamine 100 MG tablet Take 100 mg by mouth daily.  Marland Kitchen torsemide (DEMADEX) 20 MG tablet Take 20 mg by mouth daily.   . traMADol (ULTRAM) 50 MG tablet Take 1 tablet (50 mg total) by mouth every 6 (six) hours.   No facility-administered encounter medications on file as of 09/04/2017.      SIGNIFICANT DIAGNOSTIC EXAMS  PREVIOUS  10-02-14: left leg doppler: +dvt  08-08-16: left lower extremity doppler: + DVT  04-26-17: ct of abdomen and pelvis: 1. Large amount of retained large bowel stool, stool distended rectum associated with fecal impaction. 2. Sequelae of chronic pancreatitis with multiple small pancreatic cysts most compatible with pseudocyst. 3. Cholelithiasis and extra hepatic biliary dilatation without choledocholithiasis or CT findings of acute  cholecystitis. 4. Distended urinary bladder seen with neurogenic bladder/bladder outlet obstruction. Recommend correlation with voiding. 5. 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 Aortic Atherosclerosis  04-26-17: ct of head: No CT evidence for acute intracranial abnormality.   04-28-17: 2-d echo:   - Left ventricle: The cavity size was mildly dilated. Wall thickness was normal. Systolic function was severely reduced. The estimated ejection fraction was in the range of 20%  to 25%. Severe diffuse hypokinesis with regional variations. Possible disproportionately severe hypokinesis of the inferolateral, inferior, and inferoseptal myocardium. Features are consistent with a pseudonormal left ventricular filling pattern, with concomitant abnormal relaxation and increased filling pressure (grade 2 diastolic dysfunction).  04-29-17: ct of chest: 1. There is a 6 mm nodule in the right lung base. Non-contrast chest CT at 6-12 months is recommended. If the nodule is stable at time of repeat CT, then future CT at 18-24 months (from today's scan) is considered optional for low-risk patients, but is recommended for high-risk patients. :  2. No CT correlate for the recent chest x-ray finding in the right mid lung. 3. No other abnormalities.  08-12-17: chest x-ray:  Minimal right base atelectasis. No edema or consolidation. No adenopathy. Cardiac silhouette within normal limits.  08-13-17: renal ultrasound: Unremarkable sonographic appearance of the kidneys.  NO NEW EXAMS.       LABS REVIEWED: PREVIOUS     09-22-16: wbc 12.1; hgb 11.1; hct 34.8; mcv 86.6; plt 282; glucose 106; bun 14.0; creat 0.55; k+ 3.8; na++ 140; ca 10.1; liver normal albumin 4.4  01-04-17: wbc 10.7; hgb 12.3; hct 37.3; mcv 83.6; plt 227; glucose 120; bun 13.6; creat 0.58; k+ 3.7; na++ 140; ca 9.4  01-11-17: BNP 339.10 02-16-17: tsh 1.39; vit B 12: 7893; folic 81.0  17-51-02: hgb a1c 7.8  04-20-17:  wbc 12.4; hgb 11.9; hct 35.9; mcv 85.3; plt 268; glucose 136; bun 16.5; creat 0.60 ;k+ 3.3; na++ 138; liver normal albumin 4.1  04-26-17: wbc 18.5; hgb 11.9; hct 35.9; mcv 86.;3 plt 297; glucose 149; bun 24;creat 0.99; k+ 3.3; na++ 139; ca 9.6; liver normal albumin 3.7; ammonia 18; blood culture: no growth 04-27-17: urine culture: e-coli 04-28-17: wbc 12.9; hgb 8.7; hct 26.5; mcv 86.9; plt 206; glucose 110; bun 7; creat 0.61; k+ 3.5; na++ 144; ca 8.6; liver normal albumin 2.7 04-30-17: glucose 110; bun <5; creat 0.63; k+ 3.0; na++ 144; ca 8.6; mag 1.8; vit B 12: 1282; folate 33.0; iron 43; tibc 235. 05-02-17: wbc 12.4; hgb 11.9; hct 35.1; mcv 86.1 plt 453; glucose 120; bun 7.5; creat 0.57; k+ 4.2; na++ 141; ca 9.7; ast 63; alt 62; albumin 4.3;  05-12-17: FOBT: neg 05-17-17: glucose 140; bun 13.5; creat 0.56; k+ 3.8; na++ 137; ca 9.9; liver normal albumin 4.1  06-05-17: FOBT positive; c-diff: neg 06-08-17: wbc 11.0; hgb 10.7; hct 31.8; mcv 83.0; plt 381 07-11-17: wbc 11.6; hgb 11.1; hct 32.5 ;mcv 79.6; plt 397; glucose 90; bun 10.7; creat 0.62; k+ 3.7; na++ 141; ca 9.4; alk phos 134; albumin 4.2; urine culture: 65,000 multiple bacteria    Lymph# 4.2  07-16-17: urine micro-albumin 64.5; micro/creat ratio: 367.8  08-02-17: glucose 114; bun 32.0; creat 1.31; k+ 4.1; na++ 139; ca 9.6 08-12-17: wbc 18,9; hgb 12.0; hct 37.0; mcv 82.8; plt 414; glucose 126; bun 80; creat 4.42; k+ 4.8; na++ 140; ca 9.5; liver normal albumin 3.5; blood culture: no growth: urine culture: klebsiella pneumoniae and e-coli 08-14-17: wbc 13.8; hgb 10.0; hct 31.0; mcv 82.9; plt 335; glucose 106; bun 24; creat 1.09; k+ 3.5; na++ 151; ca 9.1; mag 2.0; phos 1.9 08-17-17: wbc 9.0; hgb 9.5; hct 30.6; mcv 82.3; plt 281; glucose 112; bun <5; creat 0.79; k+ 3.8; na++ 146; ca 8.7; mag 2.2 08-19-17: wbc 12.5; hgb 10.5; hct 33.0; mcv 82.5; plt 305; glucose 115; bun <5; creat 0.76; k+ 2.9; na++ 144; ca 8.7; mag 1.5   NO NEW LABS  Review of Systems   Unable to perform ROS: Dementia (confusion )     Physical Exam  Constitutional: No distress.  Frail   Neck: No thyromegaly present.  Cardiovascular: Normal rate, regular rhythm and intact distal pulses.  Murmur heard. 1/6  Pulmonary/Chest: Effort normal and breath sounds normal. No respiratory distress.  Abdominal: Soft. Bowel sounds are normal. She exhibits no distension. There is no tenderness.  Genitourinary:  Genitourinary Comments: Has foley   Musculoskeletal: She exhibits edema.  Able to move all extremities  Leans to left when in wheelchair    1+ bilateral lower extremity edema    Lymphadenopathy:    She has no cervical adenopathy.  Neurological: She is alert.  Skin: Skin is warm and dry. She is not diaphoretic.  Psychiatric: She has a normal mood and affect.    ASSESSMENT/ PLAN:  TODAY   1. Hypertension associated with diabetes type 2 : is stable b/p 124/76 will continue lisinopril 2.5 mg daily   2. Dyslipidemia associated with diabetes type 2: stable will continue lipitor 10 mg daily  3. Diabetes mellitus with neurological manifestations: without change  hgb a1c is 7.8 (previous 7.1)  Urine for micro-albumin is 64.5;   Will continue metformin xr  500 mg  daily;   tradjenta 5 mg daily and will monitor  is on statin and ace   4 .Chronic pain with osteoarthritis of both knees has polyneuropathy due to type 2 diabetes mellitus: she is  getting adequate pain relief: will continue voltaren gel 2 gm to both knees four times daily; will continue neurontin  300 mg three times daily; and will continue ultram  50 mg every 6 hours routinely  PREVIOUS    5. Right lung nodule: 6 mm will repeat ct scan of chest in June 2019  6. Chronic pancreatitis:  Sequelae of chronic pancreatitis with multiple small pancreatic cysts most compatible with pseudocyst. Will not make changes  7. Neuroleptic-induced tardive dyskinesia: is stable will continue cogentin 0.5 mg nightly   8.  Psychosis; due to alcohol: she is presently without change in status; will continue zyprexa 10 mg daily; thiamine and folic acid daily. Is on ativan 0.5 mg twice daily for anxiety    9. 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 Her current weight is 120 pounds her weight in July 2018: 154 pounds with this disease process weight loss is an expected outcome.  10. Upper leg DVT chronic left:  (08-08-16; #2) stable  will continue xarelto 20 mg nightly she is on long term therapy  will monitor this at this time.      11. Bilateral lower extremity edema: no change in status:  will continue demadex  20 mg daily with k+ 20 meq twice daily   12. Chronic constipation: stable  will continue senna s twice daily; miralax twice daily and  linzess 72 mcg daily   13. Urine retention: has chronic indwelling foley catheter: stable  will have has been seen by urology; will need foley long term     MD is aware of resident's narcotic use and is in agreement with current plan of care. We will attempt to wean resident as apropriate   Ok Edwards NP [facility]  Contact [phone] Monday through Friday 8am- 5pm  After hours call [phone]

## 2017-09-11 ENCOUNTER — Non-Acute Institutional Stay (SKILLED_NURSING_FACILITY): Payer: Medicaid Other | Admitting: Adult Health

## 2017-09-11 ENCOUNTER — Encounter: Payer: Self-pay | Admitting: Adult Health

## 2017-09-11 DIAGNOSIS — F0150 Vascular dementia without behavioral disturbance: Secondary | ICD-10-CM | POA: Diagnosis not present

## 2017-09-11 DIAGNOSIS — I825Y2 Chronic embolism and thrombosis of unspecified deep veins of left proximal lower extremity: Secondary | ICD-10-CM | POA: Diagnosis not present

## 2017-09-11 DIAGNOSIS — F10950 Alcohol use, unspecified with alcohol-induced psychotic disorder with delusions: Secondary | ICD-10-CM

## 2017-09-11 DIAGNOSIS — G2401 Drug induced subacute dyskinesia: Secondary | ICD-10-CM | POA: Diagnosis not present

## 2017-09-11 DIAGNOSIS — T43505A Adverse effect of unspecified antipsychotics and neuroleptics, initial encounter: Secondary | ICD-10-CM

## 2017-09-11 DIAGNOSIS — F1027 Alcohol dependence with alcohol-induced persisting dementia: Secondary | ICD-10-CM | POA: Diagnosis not present

## 2017-09-11 NOTE — Progress Notes (Signed)
Location:   [facility] Room Number: 204 B Place of Service:  SNF (31)   CODE STATUS: Full Code (Most form updated 12-11-13) No Known Allergies  Chief Complaint  Patient presents with  . Medical Management of Chronic Issues    Dvt; TD; dementia; psychosis; weekly follow for first 30 days post hospitalization     HPI:  She is a 62 year old long term resident of this facility being seen for the management of his chronic illnesses: dvt; TD; dementia; psychosis. She is unable to participate in the hpi or ros. There are no reports of changes in appetite; she does have periods of time of restlessness during the day time; she does have chronic lower extremity edema. There are no nursing concerns at this time.    Past Medical History:  Diagnosis Date  . Acute deep vein thrombosis (DVT) of left femoral vein ([place]) 02/22/2015  . Alcohol abuse   . Arthritis   . Chronic constipation 08/26/2015  . Dementia with behavioral problem   . Diabetes mellitus without complication ([place])   . Dysphagia    and aspiration risk  . 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 Seizures ([place])     History reviewed. No pertinent surgical history.  Social History   Socioeconomic History  . Marital status: Unknown    Spouse name: Not on file  . Number of children: Not on file  . Years of education: Not on file  . Highest education level: Not on file  Occupational History  . Not on file  Social Needs  . Financial resource strain: Not on file  . Food insecurity:    Worry: Not on file    Inability: Not on file  . Transportation needs:    Medical: Not on file    Non-medical: Not on file  Tobacco Use  . Smoking status: Former Smoker    Packs/day: 0.50    Years: 15.00    Pack years: 7.50    Types: Cigarettes  . Smokeless tobacco: Never Used  Substance and Sexual Activity  . Alcohol use: Yes    Alcohol/week: 1.2 oz    Types: 2 Cans of beer per week    Comment: occ  . Drug use: No  .  Sexual activity: Not Currently  Lifestyle  . Physical activity:    Days per week: Not on file    Minutes per session: Not on file  . Stress: Not on file  Relationships  . Social connections:    Talks on phone: Not on file    Gets together: Not on file    Attends religious service: Not on file    Active member of club or organization: Not on file    Attends meetings of clubs or organizations: Not on file    Relationship status: Not on file  . Intimate partner violence:    Fear of current or ex partner: Not on file    Emotionally abused: Not on file    Physically abused: Not on file    Forced sexual activity: Not on file  Other Topics Concern  . Not on file  Social History Narrative  . Not on file   History reviewed. No pertinent family history.    VITAL SIGNS BP 132/70   Pulse 62   Temp (!) 97.2 F (36.2 C)   Resp 16   Ht _0  (1.6 m)   Wt 122 lb 8 oz (55.6 kg)   LMP 03/25/2015 (  Approximate)   SpO2 97%   BMI 21.70 kg/m   Outpatient Encounter Medications as of 09/11/2017  Medication Sig  . atorvastatin (LIPITOR) 10 MG tablet Take 10 mg by mouth daily.  . benztropine (COGENTIN) 0.5 MG tablet Take 0.5 mg by mouth at bedtime.  . cholecalciferol (VITAMIN D) 1000 UNITS tablet Take 1,000 Units by mouth daily. Reported on 10/28/2015  . Cranberry 400 MG TABS Take 1 tablet by mouth 2 (two) times daily.  . diclofenac sodium (VOLTAREN) 1 % GEL Apply 2 g topically 4 (four) times daily. Apply to both knees topically four times a day for pain.   . divalproex (DEPAKOTE SPRINKLE) 125 MG capsule Take 125 mg by mouth 2 (two) times daily.  Marland Kitchen ENSURE (ENSURE) Give 120cc by mouth three times a day in between meals for weight support  . gabapentin (NEURONTIN) 300 MG capsule Take 300 mg by mouth 3 (three) times daily.  Marland Kitchen linaclotide (LINZESS) 72 MCG capsule Take 72 mcg by mouth daily before breakfast.  . linagliptin (TRADJENTA) 5 MG TABS tablet Take 5 mg by mouth daily.  Marland Kitchen lisinopril  (PRINIVIL,ZESTRIL) 2.5 MG tablet Take 2.5 mg by mouth daily. Hold for SBP < 110  . LORazepam (ATIVAN) 0.5 MG tablet Take 1 tablet (0.5 mg total) by mouth 2 (two) times daily.  . Melatonin 3 MG TABS Take 3 mg by mouth at bedtime.   . metFORMIN (GLUCOPHAGE) 500 MG tablet Take 500 mg by mouth daily.  . Multiple Vitamins-Minerals (DECUBI-VITE) CAPS Take 1 capsule by mouth at bedtime.   . Nutritional Supplements (NUTRITIONAL SUPPLEMENT PO) Regular Diet - Pureed texture, Thin liquids consistency.  Patient may have mech soft sandwiches and mech soft snacks  . OLANZapine (ZYPREXA) 10 MG tablet Take 10 mg by mouth at bedtime.  . polyethylene glycol (MIRALAX / GLYCOLAX) packet Take 17 g by mouth 2 (two) times daily.  . potassium chloride SA (K-DUR,KLOR-CON) 20 MEQ tablet Take 20 mEq by mouth 2 (two) times daily.   . rivaroxaban (XARELTO) 20 MG TABS tablet Take 1 tablet (20 mg total) by mouth daily with supper.  . senna-docusate (SENOKOT-S) 8.6-50 MG tablet Take 1 tablet by mouth 2 (two) times daily.  Marland Kitchen thiamine 100 MG tablet Take 100 mg by mouth daily.  Marland Kitchen torsemide (DEMADEX) 20 MG tablet Take 20 mg by mouth daily.   . traMADol (ULTRAM) 50 MG tablet Take 1 tablet (50 mg total) by mouth every 6 (six) hours.  . [DISCONTINUED] Nutritional Supplements (PROMOD) LIQD Give 30cc by mouth two times daily for state 2 on sacrum   No facility-administered encounter medications on file as of 09/11/2017.      SIGNIFICANT DIAGNOSTIC EXAMS  PREVIOUS  10-02-14: left leg doppler: +dvt  08-08-16: left lower extremity doppler: + DVT  04-26-17: ct of abdomen and pelvis: 1. Large amount of retained large bowel stool, stool distended rectum associated with fecal impaction. 2. Sequelae of chronic pancreatitis with multiple small pancreatic cysts most compatible with pseudocyst. 3. Cholelithiasis and extra hepatic biliary dilatation without choledocholithiasis or CT findings of acute cholecystitis. 4. Distended urinary  bladder seen with neurogenic bladder/bladder outlet obstruction. Recommend correlation with voiding. 5. 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 Aortic Atherosclerosis  04-26-17: ct of head: No CT evidence for acute intracranial abnormality.   04-28-17: 2-d echo:   - Left ventricle: The cavity size was mildly dilated. Wall thickness was normal. Systolic function was severely reduced. The estimated ejection fraction was in  the range of 20% to 25%. Severe diffuse hypokinesis with regional variations. Possible disproportionately severe hypokinesis of the inferolateral, inferior, and inferoseptal myocardium. Features are consistent with a pseudonormal left ventricular filling pattern, with concomitant abnormal relaxation and increased filling pressure (grade 2 diastolic dysfunction).  04-29-17: ct of chest: 1. There is a 6 mm nodule in the right lung base. Non-contrast chest CT at 6-12 months is recommended. If the nodule is stable at time of repeat CT, then future CT at 18-24 months (from today's scan) is considered optional for low-risk patients, but is recommended for high-risk patients. :  2. No CT correlate for the recent chest x-ray finding in the right mid lung. 3. No other abnormalities.  08-12-17: chest x-ray:  Minimal right base atelectasis. No edema or consolidation. No adenopathy. Cardiac silhouette within normal limits.  08-13-17: renal ultrasound: Unremarkable sonographic appearance of the kidneys.  NO NEW EXAMS.       LABS REVIEWED: PREVIOUS     09-22-16: wbc 12.1; hgb 11.1; hct 34.8; mcv 86.6; plt 282; glucose 106; bun 14.0; creat 0.55; k+ 3.8; na++ 140; ca 10.1; liver normal albumin 4.4  01-04-17: wbc 10.7; hgb 12.3; hct 37.3; mcv 83.6; plt 227; glucose 120; bun 13.6; creat 0.58; k+ 3.7; na++ 140; ca 9.4  01-11-17: BNP 339.10 02-16-17: tsh 1.39; vit B 12: 9924; folic 26.8  34-19-62: hgb a1c 7.8  04-20-17: wbc 12.4; hgb 11.9; hct 35.9; mcv 85.3;  plt 268; glucose 136; bun 16.5; creat 0.60 ;k+ 3.3; na++ 138; liver normal albumin 4.1  04-26-17: wbc 18.5; hgb 11.9; hct 35.9; mcv 86.;3 plt 297; glucose 149; bun 24;creat 0.99; k+ 3.3; na++ 139; ca 9.6; liver normal albumin 3.7; ammonia 18; blood culture: no growth 04-27-17: urine culture: e-coli 04-28-17: wbc 12.9; hgb 8.7; hct 26.5; mcv 86.9; plt 206; glucose 110; bun 7; creat 0.61; k+ 3.5; na++ 144; ca 8.6; liver normal albumin 2.7 04-30-17: glucose 110; bun <5; creat 0.63; k+ 3.0; na++ 144; ca 8.6; mag 1.8; vit B 12: 1282; folate 33.0; iron 43; tibc 235. 05-02-17: wbc 12.4; hgb 11.9; hct 35.1; mcv 86.1 plt 453; glucose 120; bun 7.5; creat 0.57; k+ 4.2; na++ 141; ca 9.7; ast 63; alt 62; albumin 4.3;  05-12-17: FOBT: neg 05-17-17: glucose 140; bun 13.5; creat 0.56; k+ 3.8; na++ 137; ca 9.9; liver normal albumin 4.1  06-05-17: FOBT positive; c-diff: neg 06-08-17: wbc 11.0; hgb 10.7; hct 31.8; mcv 83.0; plt 381 07-11-17: wbc 11.6; hgb 11.1; hct 32.5 ;mcv 79.6; plt 397; glucose 90; bun 10.7; creat 0.62; k+ 3.7; na++ 141; ca 9.4; alk phos 134; albumin 4.2; urine culture: 65,000 multiple bacteria    Lymph# 4.2  07-16-17: urine micro-albumin 64.5; micro/creat ratio: 367.8  08-02-17: glucose 114; bun 32.0; creat 1.31; k+ 4.1; na++ 139; ca 9.6 08-12-17: wbc 18,9; hgb 12.0; hct 37.0; mcv 82.8; plt 414; glucose 126; bun 80; creat 4.42; k+ 4.8; na++ 140; ca 9.5; liver normal albumin 3.5; blood culture: no growth: urine culture: klebsiella pneumoniae and e-coli 08-14-17: wbc 13.8; hgb 10.0; hct 31.0; mcv 82.9; plt 335; glucose 106; bun 24; creat 1.09; k+ 3.5; na++ 151; ca 9.1; mag 2.0; phos 1.9 08-17-17: wbc 9.0; hgb 9.5; hct 30.6; mcv 82.3; plt 281; glucose 112; bun <5; creat 0.79; k+ 3.8; na++ 146; ca 8.7; mag 2.2 08-19-17: wbc 12.5; hgb 10.5; hct 33.0; mcv 82.5; plt 305; glucose 115; bun <5; creat 0.76; k+ 2.9; na++ 144; ca 8.7; mag 1.5   NO NEW LABS  Review of Systems  Unable to perform ROS: Dementia  (confusion )    Physical Exam  Constitutional: No distress.  Frail   Neck: No thyromegaly present.  Cardiovascular: Normal rate, regular rhythm and intact distal pulses.  Murmur heard. 1/6  Pulmonary/Chest: Effort normal and breath sounds normal. No respiratory distress.  Abdominal: Soft. Bowel sounds are normal. She exhibits no distension. There is no tenderness.  Genitourinary:  Genitourinary Comments: Foley   Musculoskeletal: She exhibits edema.  Able to move all extremities  Leans to left when in wheelchair    1+ bilateral lower extremity edema    Lymphadenopathy:    She has no cervical adenopathy.  Neurological: She is alert.  Skin: Skin is warm and dry. She is not diaphoretic.  Psychiatric: She has a normal mood and affect.    ASSESSMENT/ PLAN:  TODAY   1. Neuroleptic-induced tardive dyskinesia: is stable will continue cogentin 0.5 mg nightly   2. Psychosis; due to alcohol with delusions: she is presently without change in status; will continue zyprexa 10 mg daily; thiamine and folic acid daily. Is on ativan 0.5 mg twice daily for anxiety  Will continue depakote 125 mg twice daily to help stabilize mood.    3. 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 Her current weight is 120 pounds her weight in July 2018: 154 pounds with this disease process weight loss is an expected outcome.  4. Upper leg DVT chronic left:  (08-08-16; #2) stable  will continue xarelto 20 mg nightly she is on long term therapy  will monitor this at this time.      PREVIOUS    5. Right lung nodule: 6 mm will repeat ct scan of chest in June 2019  6. Chronic pancreatitis:  Sequelae of chronic pancreatitis with multiple small pancreatic cysts most compatible with pseudocyst. Will not make changes  7. Bilateral lower extremity edema: no change in status:  will continue  demadex  20 mg daily with k+ 20 meq twice daily   8. Chronic constipation: stable  will continue senna s twice daily; miralax twice daily and  linzess 72 mcg daily   9. Urine retention: has chronic indwelling foley catheter: stable  will have has been seen by urology; will need foley long term    10. Hypertension associated with diabetes type 2 : is stable b/p 132/70 will continue lisinopril 2.5 mg daily   11. Dyslipidemia associated with diabetes type 2: stable will continue lipitor 10 mg daily  12. Diabetes mellitus with neurological manifestations: without change  hgb a1c is 7.8 (previous 7.1)  Urine for micro-albumin is 64.5;   Will continue metformin xr  500 mg  daily;   tradjenta 5 mg daily and will monitor  is on statin and ace   13.Chronic pain with osteoarthritis of both knees has polyneuropathy due to type 2 diabetes mellitus: she is  getting adequate pain relief: will continue voltaren gel 2 gm to both knees four times daily; will continue neurontin  300 mg three times daily; and will continue ultram  50 mg every 6 hours routinely    MD is aware of resident's narcotic use and is in agreement with current plan of care. We will attempt to wean resident as apropriate   Ok Edwards NP [facility]  Contact [phone] Monday through Friday 8am- 5pm  After hours call [phone]

## 2017-09-12 ENCOUNTER — Encounter: Payer: Self-pay | Admitting: Adult Health

## 2017-09-12 ENCOUNTER — Non-Acute Institutional Stay (SKILLED_NURSING_FACILITY): Payer: Medicaid Other | Admitting: Adult Health

## 2017-09-12 DIAGNOSIS — E43 Unspecified severe protein-calorie malnutrition: Secondary | ICD-10-CM | POA: Diagnosis not present

## 2017-09-12 DIAGNOSIS — F0150 Vascular dementia without behavioral disturbance: Secondary | ICD-10-CM | POA: Diagnosis not present

## 2017-09-12 DIAGNOSIS — R627 Adult failure to thrive: Secondary | ICD-10-CM | POA: Diagnosis not present

## 2017-09-12 DIAGNOSIS — R634 Abnormal weight loss: Secondary | ICD-10-CM | POA: Diagnosis not present

## 2017-09-12 NOTE — Progress Notes (Signed)
Location:   [facility] Room Number: 204 B Place of Service:  SNF (31)   CODE STATUS: Full Code (Most form updated 08-28-17)  No Known Allergies  Chief Complaint  Patient presents with  . Acute Visit    Weight Loss    HPI:  I have been asked to see her for weight loss. Her weight on 07-20-17 was 136 pounds; on 08-03-17: 132 pounds; on 08-19-17: 120; pounds; and on 09-03-17: 122 pounds. She is unable to fully participate in the hpi or ros. Her appetite is variable. She does continue to wheel herself throughout the unit; there are no reports of uncontrolled pain. She did tell me that she is not hungry.    Past Medical History:  Diagnosis Date  . Acute deep vein thrombosis (DVT) of left femoral vein ([place]) 02/22/2015  . Alcohol abuse   . Arthritis   . Chronic constipation 08/26/2015  . Dementia with behavioral problem   . Diabetes mellitus without complication ([place])   . Dysphagia    and aspiration risk  . 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 Seizures ([place])     History reviewed. No pertinent surgical history.  Social History   Socioeconomic History  . Marital status: Unknown    Spouse name: Not on file  . Number of children: Not on file  . Years of education: Not on file  . Highest education level: Not on file  Occupational History  . Not on file  Social Needs  . Financial resource strain: Not on file  . Food insecurity:    Worry: Not on file    Inability: Not on file  . Transportation needs:    Medical: Not on file    Non-medical: Not on file  Tobacco Use  . Smoking status: Former Smoker    Packs/day: 0.50    Years: 15.00    Pack years: 7.50    Types: Cigarettes  . Smokeless tobacco: Never Used  Substance and Sexual Activity  . Alcohol use: Yes    Alcohol/week: 1.2 oz    Types: 2 Cans of beer per week    Comment: occ  . Drug use: No  . Sexual activity: Not Currently  Lifestyle  . Physical activity:    Days per week: Not on file    Minutes per  session: Not on file  . Stress: Not on file  Relationships  . Social connections:    Talks on phone: Not on file    Gets together: Not on file    Attends religious service: Not on file    Active member of club or organization: Not on file    Attends meetings of clubs or organizations: Not on file    Relationship status: Not on file  . Intimate partner violence:    Fear of current or ex partner: Not on file    Emotionally abused: Not on file    Physically abused: Not on file    Forced sexual activity: Not on file  Other Topics Concern  . Not on file  Social History Narrative  . Not on file   History reviewed. No pertinent family history.    VITAL SIGNS BP 132/70   Pulse 68   Temp (!) 97.2 F (36.2 C)   Resp 16   Ht 5' 3"  (1.6 m)   Wt 122 lb 8 oz (55.6 kg)   LMP 03/25/2015 (Approximate)   SpO2 97%   BMI 21.70 kg/m   Outpatient  Encounter Medications as of 09/12/2017  Medication Sig  . atorvastatin (LIPITOR) 10 MG tablet Take 10 mg by mouth daily.  . benztropine (COGENTIN) 0.5 MG tablet Take 0.5 mg by mouth at bedtime.  . cholecalciferol (VITAMIN D) 1000 UNITS tablet Take 1,000 Units by mouth daily. Reported on 10/28/2015  . Cranberry 400 MG TABS Take 1 tablet by mouth 2 (two) times daily.  . diclofenac sodium (VOLTAREN) 1 % GEL Apply 2 g topically 4 (four) times daily. Apply to both knees topically four times a day for pain.   . divalproex (DEPAKOTE SPRINKLE) 125 MG capsule Take 125 mg by mouth 2 (two) times daily.  Marland Kitchen ENSURE (ENSURE) Give 120cc by mouth three times a day in between meals for weight support  . gabapentin (NEURONTIN) 300 MG capsule Take 300 mg by mouth 3 (three) times daily.  Marland Kitchen linaclotide (LINZESS) 72 MCG capsule Take 72 mcg by mouth daily before breakfast.  . linagliptin (TRADJENTA) 5 MG TABS tablet Take 5 mg by mouth daily.  Marland Kitchen lisinopril (PRINIVIL,ZESTRIL) 2.5 MG tablet Take 2.5 mg by mouth daily. Hold for SBP < 110  . LORazepam (ATIVAN) 0.5 MG tablet  Take 1 tablet (0.5 mg total) by mouth 2 (two) times daily.  . Melatonin 3 MG TABS Take 3 mg by mouth at bedtime.   . metFORMIN (GLUCOPHAGE) 500 MG tablet Take 500 mg by mouth daily.  . Multiple Vitamins-Minerals (DECUBI-VITE) CAPS Take 1 capsule by mouth at bedtime.   Marland Kitchen OLANZapine (ZYPREXA) 10 MG tablet Take 10 mg by mouth at bedtime.  . polyethylene glycol (MIRALAX / GLYCOLAX) packet Take 17 g by mouth 2 (two) times daily.  . potassium chloride SA (K-DUR,KLOR-CON) 20 MEQ tablet Take 20 mEq by mouth 2 (two) times daily.   . rivaroxaban (XARELTO) 20 MG TABS tablet Take 1 tablet (20 mg total) by mouth daily with supper.  . senna-docusate (SENOKOT-S) 8.6-50 MG tablet Take 1 tablet by mouth 2 (two) times daily.  Marland Kitchen thiamine 100 MG tablet Take 100 mg by mouth daily.  Marland Kitchen torsemide (DEMADEX) 20 MG tablet Take 20 mg by mouth daily.   . traMADol (ULTRAM) 50 MG tablet Take 1 tablet (50 mg total) by mouth every 6 (six) hours.  . Nutritional Supplements (NUTRITIONAL SUPPLEMENT PO) Regular Diet - Pureed texture, Thin liquids consistency.  Patient may have mech soft sandwiches and mech soft snacks   No facility-administered encounter medications on file as of 09/12/2017.      SIGNIFICANT DIAGNOSTIC EXAMS   PREVIOUS  10-02-14: left leg doppler: +dvt  08-08-16: left lower extremity doppler: + DVT  04-26-17: ct of abdomen and pelvis: 1. Large amount of retained large bowel stool, stool distended rectum associated with fecal impaction. 2. Sequelae of chronic pancreatitis with multiple small pancreatic cysts most compatible with pseudocyst. 3. Cholelithiasis and extra hepatic biliary dilatation without choledocholithiasis or CT findings of acute cholecystitis. 4. Distended urinary bladder seen with neurogenic bladder/bladder outlet obstruction. Recommend correlation with voiding. 5. 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 Aortic Atherosclerosis  04-26-17: ct of head:  No CT evidence for acute intracranial abnormality.   04-28-17: 2-d echo:   - Left ventricle: The cavity size was mildly dilated. Wall thickness was normal. Systolic function was severely reduced. The estimated ejection fraction was in the range of 20% to 25%. Severe diffuse hypokinesis with regional variations. Possible disproportionately severe hypokinesis of the inferolateral, inferior, and inferoseptal myocardium. Features are consistent with a pseudonormal left ventricular  filling pattern, with concomitant abnormal relaxation and increased filling pressure (grade 2 diastolic dysfunction).  04-29-17: ct of chest: 1. There is a 6 mm nodule in the right lung base. Non-contrast chest CT at 6-12 months is recommended. If the nodule is stable at time of repeat CT, then future CT at 18-24 months (from today's scan) is considered optional for low-risk patients, but is recommended for high-risk patients. :  2. No CT correlate for the recent chest x-ray finding in the right mid lung. 3. No other abnormalities.  08-12-17: chest x-ray:  Minimal right base atelectasis. No edema or consolidation. No adenopathy. Cardiac silhouette within normal limits.  08-13-17: renal ultrasound: Unremarkable sonographic appearance of the kidneys.  NO NEW EXAMS.       LABS REVIEWED: PREVIOUS     09-22-16: wbc 12.1; hgb 11.1; hct 34.8; mcv 86.6; plt 282; glucose 106; bun 14.0; creat 0.55; k+ 3.8; na++ 140; ca 10.1; liver normal albumin 4.4  01-04-17: wbc 10.7; hgb 12.3; hct 37.3; mcv 83.6; plt 227; glucose 120; bun 13.6; creat 0.58; k+ 3.7; na++ 140; ca 9.4  01-11-17: BNP 339.10 02-16-17: tsh 1.39; vit B 12: 4540; folic 98.1  19-14-78: hgb a1c 7.8  04-20-17: wbc 12.4; hgb 11.9; hct 35.9; mcv 85.3; plt 268; glucose 136; bun 16.5; creat 0.60 ;k+ 3.3; na++ 138; liver normal albumin 4.1  04-26-17: wbc 18.5; hgb 11.9; hct 35.9; mcv 86.;3 plt 297; glucose 149; bun 24;creat 0.99; k+ 3.3; na++ 139; ca 9.6; liver normal albumin 3.7;  ammonia 18; blood culture: no growth 04-27-17: urine culture: e-coli 04-28-17: wbc 12.9; hgb 8.7; hct 26.5; mcv 86.9; plt 206; glucose 110; bun 7; creat 0.61; k+ 3.5; na++ 144; ca 8.6; liver normal albumin 2.7 04-30-17: glucose 110; bun <5; creat 0.63; k+ 3.0; na++ 144; ca 8.6; mag 1.8; vit B 12: 1282; folate 33.0; iron 43; tibc 235. 05-02-17: wbc 12.4; hgb 11.9; hct 35.1; mcv 86.1 plt 453; glucose 120; bun 7.5; creat 0.57; k+ 4.2; na++ 141; ca 9.7; ast 63; alt 62; albumin 4.3;  05-12-17: FOBT: neg 05-17-17: glucose 140; bun 13.5; creat 0.56; k+ 3.8; na++ 137; ca 9.9; liver normal albumin 4.1  06-05-17: FOBT positive; c-diff: neg 06-08-17: wbc 11.0; hgb 10.7; hct 31.8; mcv 83.0; plt 381 07-11-17: wbc 11.6; hgb 11.1; hct 32.5 ;mcv 79.6; plt 397; glucose 90; bun 10.7; creat 0.62; k+ 3.7; na++ 141; ca 9.4; alk phos 134; albumin 4.2; urine culture: 65,000 multiple bacteria    Lymph# 4.2  07-16-17: urine micro-albumin 64.5; micro/creat ratio: 367.8  08-02-17: glucose 114; bun 32.0; creat 1.31; k+ 4.1; na++ 139; ca 9.6 08-12-17: wbc 18,9; hgb 12.0; hct 37.0; mcv 82.8; plt 414; glucose 126; bun 80; creat 4.42; k+ 4.8; na++ 140; ca 9.5; liver normal albumin 3.5; blood culture: no growth: urine culture: klebsiella pneumoniae and e-coli 08-14-17: wbc 13.8; hgb 10.0; hct 31.0; mcv 82.9; plt 335; glucose 106; bun 24; creat 1.09; k+ 3.5; na++ 151; ca 9.1; mag 2.0; phos 1.9 08-17-17: wbc 9.0; hgb 9.5; hct 30.6; mcv 82.3; plt 281; glucose 112; bun <5; creat 0.79; k+ 3.8; na++ 146; ca 8.7; mag 2.2 08-19-17: wbc 12.5; hgb 10.5; hct 33.0; mcv 82.5; plt 305; glucose 115; bun <5; creat 0.76; k+ 2.9; na++ 144; ca 8.7; mag 1.5   NO NEW LABS    Review of Systems  Unable to perform ROS: Dementia (confusion )    Physical Exam  Constitutional: No distress.  Frail   Neck: No thyromegaly present.  Cardiovascular: Normal rate, regular rhythm and intact distal pulses.  Murmur heard. 1/6  Pulmonary/Chest: Effort normal and breath  sounds normal. No respiratory distress.  Abdominal: Soft. Bowel sounds are normal. She exhibits no distension. There is no tenderness.  Genitourinary:  Genitourinary Comments: Foley   Musculoskeletal: She exhibits edema.  Able to move all extremities  Leans to left when in wheelchair    1+ bilateral lower extremity edema    Lymphadenopathy:    She has no cervical adenopathy.  Neurological: She is alert.  Skin: Skin is warm and dry. She is not diaphoretic.  Psychiatric: She has a normal mood and affect.      ASSESSMENT/ PLAN:  TODAY:   1. Abnormal weight loss 2. Severe-protein calorie malnutrition 3. Failure to thrive in adult 4. Vascular dementia without behavioral disturbance  Weight loss at the later stages of dementia is an unfortunate but anticipated outcome Will begin her on remeron 7.5 mg nightly for 30 days to help promote her appetite.   MD is aware of resident's narcotic use and is in agreement with current plan of care. We will attempt to wean resident as apropriate   Ok Edwards NP [facility]  Contact [phone] Monday through Friday 8am- 5pm  After hours call [phone]

## 2017-09-18 ENCOUNTER — Encounter: Payer: Self-pay | Admitting: Adult Health

## 2017-09-18 ENCOUNTER — Non-Acute Institutional Stay (SKILLED_NURSING_FACILITY): Payer: Medicaid Other | Admitting: Adult Health

## 2017-09-18 DIAGNOSIS — F1027 Alcohol dependence with alcohol-induced persisting dementia: Secondary | ICD-10-CM | POA: Diagnosis not present

## 2017-09-18 DIAGNOSIS — I5042 Chronic combined systolic (congestive) and diastolic (congestive) heart failure: Secondary | ICD-10-CM

## 2017-09-18 DIAGNOSIS — E1142 Type 2 diabetes mellitus with diabetic polyneuropathy: Secondary | ICD-10-CM | POA: Diagnosis not present

## 2017-09-18 DIAGNOSIS — E1159 Type 2 diabetes mellitus with other circulatory complications: Secondary | ICD-10-CM | POA: Diagnosis not present

## 2017-09-18 DIAGNOSIS — G2401 Drug induced subacute dyskinesia: Secondary | ICD-10-CM | POA: Diagnosis not present

## 2017-09-18 DIAGNOSIS — E785 Hyperlipidemia, unspecified: Secondary | ICD-10-CM | POA: Diagnosis not present

## 2017-09-18 DIAGNOSIS — I10 Essential (primary) hypertension: Secondary | ICD-10-CM

## 2017-09-18 DIAGNOSIS — E1129 Type 2 diabetes mellitus with other diabetic kidney complication: Secondary | ICD-10-CM

## 2017-09-18 DIAGNOSIS — I825Y2 Chronic embolism and thrombosis of unspecified deep veins of left proximal lower extremity: Secondary | ICD-10-CM

## 2017-09-18 DIAGNOSIS — R809 Proteinuria, unspecified: Secondary | ICD-10-CM

## 2017-09-18 DIAGNOSIS — T43505A Adverse effect of unspecified antipsychotics and neuroleptics, initial encounter: Secondary | ICD-10-CM

## 2017-09-18 DIAGNOSIS — K5909 Other constipation: Secondary | ICD-10-CM | POA: Diagnosis not present

## 2017-09-18 DIAGNOSIS — E1169 Type 2 diabetes mellitus with other specified complication: Secondary | ICD-10-CM | POA: Diagnosis not present

## 2017-09-18 NOTE — Progress Notes (Signed)
Provider:  Ok Edwards, NP Location:  [facility] Room Number: [place] of Service:  SNF [phone])   PCP: Gildardo Cranker, DO Patient Care Team: Gildardo Cranker, DO as PCP - General (Internal Medicine) Nyoka Cowden Phylis Bougie, NP as Nurse Practitioner (Nurse Practitioner) [facility] [place] ([place])  Extended Emergency Contact Information Primary Emergency Contact: Gosling,Marcus POA Address: [address]          [place], [place] [address] [place] Phone: [phone] Mobile Phone: [phone] Relation: Brother Secondary Emergency Contact: Lonni Fix [place] Phone: [phone] Relation: Niece  Code Status: Full Code (Most form updated 08-28-17)  Goals of Care: Advanced Directive information Advanced Directives 09/18/2017  Does Patient Have a Medical Advance Directive? Yes  Type of Advance Directive Out of facility DNR (pink MOST or yellow form)  Does patient want to make changes to medical advance directive? No - Patient declined  Copy of [place] in Chart? -  Would patient like information on creating a medical advance directive? No - Patient declined  Pre-existing out of facility DNR order (yellow form or pink MOST form) Pink MOST form placed in chart (order not valid for inpatient use)      No Known Allergies   Chief Complaint  Patient presents with  . Annual Exam    Hypertension; heart failure; diabetes     HPI: Patient is a 62 y.o. female seen today for an annual comprehensive examination. She was hospitalized in March 2019 for septic shock due to UTI. She did have urinary retention which required a foley placed. She has followed up with urology; and will require foley long term. She is unable to fully participate in the hpi or ros. There are no reports of fevers; no changes in appetite; no reports of insomnia. There are no nursing concerns at this time. She will  continue to be followed for her chronic illnesses including: hypertension; dvt; heart failure; diabetes.   Past Medical History:  Diagnosis Date  . Acute deep vein thrombosis (DVT) of left femoral vein ([place]) 02/22/2015  . Alcohol abuse   . Arthritis   . Chronic constipation 08/26/2015  . Dementia with behavioral problem   . Diabetes mellitus without complication ([place])   . Dysphagia    and aspiration risk  . 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 Seizures ([place])    History reviewed. No pertinent surgical history.  reports that she has quit smoking. Her smoking use included cigarettes. She has a 7.50 pack-year smoking history. She has never used smokeless tobacco. She reports that she drinks about 1.2 oz of alcohol per week. She reports that she does not use drugs. Social History   Socioeconomic History  . Marital status: Unknown    Spouse name: Not on file  . Number of children: Not on file  . Years of education: Not on file  . Highest education level: Not on file  Occupational History  . Not on file  Social Needs  . Financial resource strain: Not on file  . Food insecurity:    Worry: Not on file    Inability: Not on file  . Transportation needs:    Medical: Not on file    Non-medical: Not on file  Tobacco Use  . Smoking status: Former Smoker    Packs/day: 0.50    Years: 15.00    Pack years: 7.50    Types: Cigarettes  . Smokeless tobacco: Never  Used  Substance and Sexual Activity  . Alcohol use: Yes    Alcohol/week: 1.2 oz    Types: 2 Cans of beer per week    Comment: occ  . Drug use: No  . Sexual activity: Not Currently  Lifestyle  . Physical activity:    Days per week: Not on file    Minutes per session: Not on file  . Stress: Not on file  Relationships  . Social connections:    Talks on phone: Not on file    Gets together: Not on file    Attends religious service: Not on file    Active member of club or organization: Not on file    Attends meetings of clubs or  organizations: Not on file    Relationship status: Not on file  . Intimate partner violence:    Fear of current or ex partner: Not on file    Emotionally abused: Not on file    Physically abused: Not on file    Forced sexual activity: Not on file  Other Topics Concern  . Not on file  Social History Narrative  . Not on file   History reviewed. No pertinent family history.  Vitals:   09/18/17 0935  BP: 118/72  Pulse: 68  Resp: 16  Temp: (!) 97.2 F (36.2 C)  SpO2: 97%  Weight: 120 lb 11.2 oz (54.7 kg)  Height: _0  (1.6 m)   Body mass index is 21.38 kg/m.  Allergies as of 09/18/2017   No Known Allergies     Medication List        Accurate as of 09/18/17  9:55 AM. Always use your most recent med list.          atorvastatin 10 MG tablet Commonly known as:  LIPITOR Take 10 mg by mouth daily.   benztropine 0.5 MG tablet Commonly known as:  COGENTIN Take 0.5 mg by mouth at bedtime.   cholecalciferol 1000 units tablet Commonly known as:  VITAMIN D Take 1,000 Units by mouth daily. Reported on 10/28/2015   Cranberry 400 MG Tabs Take 1 tablet by mouth 2 (two) times daily.   DECUBI-VITE Caps Take 1 capsule by mouth at bedtime.   diclofenac sodium 1 % Gel Commonly known as:  VOLTAREN Apply 2 g topically 4 (four) times daily. Apply to both knees topically four times a day for pain.   divalproex 125 MG capsule Commonly known as:  DEPAKOTE SPRINKLE Take 125 mg by mouth 3 (three) times daily.   ENSURE Give 120cc by mouth three times a day in between meals for weight support   NUTRITIONAL SUPPLEMENT PO Regular Diet - Mechanical Soft texture,  Thin liquids consistency.  Patient may have mech soft sandwiches and mech soft snacks   gabapentin 300 MG capsule Commonly known as:  NEURONTIN Take 300 mg by mouth 3 (three) times daily.   linaclotide 72 MCG capsule Commonly known as:  LINZESS Take 72 mcg by mouth daily before breakfast.   linagliptin 5 MG Tabs  tablet Commonly known as:  TRADJENTA Take 5 mg by mouth daily.   lisinopril 2.5 MG tablet Commonly known as:  PRINIVIL,ZESTRIL Take 2.5 mg by mouth daily. Hold for SBP < 110   LORazepam 0.5 MG tablet Commonly known as:  ATIVAN Take 1 tablet (0.5 mg total) by mouth 2 (two) times daily.   Melatonin 3 MG Tabs Take 3 mg by mouth at bedtime.   metFORMIN 500 MG tablet Commonly known as:  GLUCOPHAGE Take 500 mg by mouth daily.   mirtazapine 7.5 MG tablet Commonly known as:  REMERON Take 7.5 mg by mouth at bedtime. X 30 days   OLANZapine 10 MG tablet Commonly known as:  ZYPREXA Take 10 mg by mouth at bedtime.   polyethylene glycol packet Commonly known as:  MIRALAX / GLYCOLAX Take 17 g by mouth 2 (two) times daily.   potassium chloride SA 20 MEQ tablet Commonly known as:  K-DUR,KLOR-CON Take 20 mEq by mouth 2 (two) times daily.   rivaroxaban 20 MG Tabs tablet Commonly known as:  XARELTO Take 1 tablet (20 mg total) by mouth daily with supper.   senna-docusate 8.6-50 MG tablet Commonly known as:  Senokot-S Take 1 tablet by mouth 2 (two) times daily.   thiamine 100 MG tablet Take 100 mg by mouth daily.   torsemide 20 MG tablet Commonly known as:  DEMADEX Take 20 mg by mouth daily.   traMADol 50 MG tablet Commonly known as:  ULTRAM Take 1 tablet (50 mg total) by mouth every 6 (six) hours.        SIGNIFICANT DIAGNOSTIC EXAMS    PREVIOUS  10-02-14: left leg doppler: +dvt  08-08-16: left lower extremity doppler: + DVT  04-26-17: ct of abdomen and pelvis: 1. Large amount of retained large bowel stool, stool distended rectum associated with fecal impaction. 2. Sequelae of chronic pancreatitis with multiple small pancreatic cysts most compatible with pseudocyst. 3. Cholelithiasis and extra hepatic biliary dilatation without choledocholithiasis or CT findings of acute cholecystitis. 4. Distended urinary bladder seen with neurogenic bladder/bladder outlet obstruction.  Recommend correlation with voiding. 5. 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 Aortic Atherosclerosis  04-26-17: ct of head: No CT evidence for acute intracranial abnormality.   04-28-17: 2-d echo:   - Left ventricle: The cavity size was mildly dilated. Wall thickness was normal. Systolic function was severely reduced. The estimated ejection fraction was in the range of 20% to 25%. Severe diffuse hypokinesis with regional variations. Possible disproportionately severe hypokinesis of the inferolateral, inferior, and inferoseptal myocardium. Features are consistent with a pseudonormal left ventricular filling pattern, with concomitant abnormal relaxation and increased filling pressure (grade 2 diastolic dysfunction).  04-29-17: ct of chest: 1. There is a 6 mm nodule in the right lung base. Non-contrast chest CT at 6-12 months is recommended. If the nodule is stable at time of repeat CT, then future CT at 18-24 months (from today's scan) is considered optional for low-risk patients, but is recommended for high-risk patients. :  2. No CT correlate for the recent chest x-ray finding in the right mid lung. 3. No other abnormalities.  08-12-17: chest x-ray:  Minimal right base atelectasis. No edema or consolidation. No adenopathy. Cardiac silhouette within normal limits.  08-13-17: renal ultrasound: Unremarkable sonographic appearance of the kidneys.  NO NEW EXAMS.       LABS REVIEWED: PREVIOUS     09-22-16: wbc 12.1; hgb 11.1; hct 34.8; mcv 86.6; plt 282; glucose 106; bun 14.0; creat 0.55; k+ 3.8; na++ 140; ca 10.1; liver normal albumin 4.4  01-04-17: wbc 10.7; hgb 12.3; hct 37.3; mcv 83.6; plt 227; glucose 120; bun 13.6; creat 0.58; k+ 3.7; na++ 140; ca 9.4  01-11-17: BNP 339.10 02-16-17: tsh 1.39; vit B 12: 0037; folic 04.8  88-91-69: hgb a1c 7.8  04-20-17: wbc 12.4; hgb 11.9; hct 35.9; mcv 85.3; plt 268; glucose 136; bun 16.5; creat 0.60 ;k+ 3.3; na++ 138;  liver normal albumin 4.1  04-26-17: wbc 18.5; hgb 11.9; hct 35.9; mcv 86.;3 plt 297; glucose 149; bun 24;creat 0.99; k+ 3.3; na++ 139; ca 9.6; liver normal albumin 3.7; ammonia 18; blood culture: no growth 04-27-17: urine culture: e-coli 04-28-17: wbc 12.9; hgb 8.7; hct 26.5; mcv 86.9; plt 206; glucose 110; bun 7; creat 0.61; k+ 3.5; na++ 144; ca 8.6; liver normal albumin 2.7 04-30-17: glucose 110; bun <5; creat 0.63; k+ 3.0; na++ 144; ca 8.6; mag 1.8; vit B 12: 1282; folate 33.0; iron 43; tibc 235. 05-02-17: wbc 12.4; hgb 11.9; hct 35.1; mcv 86.1 plt 453; glucose 120; bun 7.5; creat 0.57; k+ 4.2; na++ 141; ca 9.7; ast 63; alt 62; albumin 4.3;  05-12-17: FOBT: neg 05-17-17: glucose 140; bun 13.5; creat 0.56; k+ 3.8; na++ 137; ca 9.9; liver normal albumin 4.1  06-05-17: FOBT positive; c-diff: neg 06-08-17: wbc 11.0; hgb 10.7; hct 31.8; mcv 83.0; plt 381 07-11-17: wbc 11.6; hgb 11.1; hct 32.5 ;mcv 79.6; plt 397; glucose 90; bun 10.7; creat 0.62; k+ 3.7; na++ 141; ca 9.4; alk phos 134; albumin 4.2; urine culture: 65,000 multiple bacteria    Lymph# 4.2  07-16-17: urine micro-albumin 64.5; micro/creat ratio: 367.8  08-02-17: glucose 114; bun 32.0; creat 1.31; k+ 4.1; na++ 139; ca 9.6 08-12-17: wbc 18,9; hgb 12.0; hct 37.0; mcv 82.8; plt 414; glucose 126; bun 80; creat 4.42; k+ 4.8; na++ 140; ca 9.5; liver normal albumin 3.5; blood culture: no growth: urine culture: klebsiella pneumoniae and e-coli 08-14-17: wbc 13.8; hgb 10.0; hct 31.0; mcv 82.9; plt 335; glucose 106; bun 24; creat 1.09; k+ 3.5; na++ 151; ca 9.1; mag 2.0; phos 1.9 08-17-17: wbc 9.0; hgb 9.5; hct 30.6; mcv 82.3; plt 281; glucose 112; bun <5; creat 0.79; k+ 3.8; na++ 146; ca 8.7; mag 2.2 08-19-17: wbc 12.5; hgb 10.5; hct 33.0; mcv 82.5; plt 305; glucose 115; bun <5; creat 0.76; k+ 2.9; na++ 144; ca 8.7; mag 1.5   TODAY;   08-27-17: glucose 105; bun 17.3; creat 1.60; k+ 4.0; na++ 135; ca 9.2; liver normal albumin 3.6 09-04-17: depakote 41     Review of  Systems  Unable to perform ROS: Dementia (confusion)    Physical Exam  Constitutional: No distress.  Frail   HENT:  Mouth/Throat: Oropharynx is clear and moist.  Eyes: Conjunctivae are normal.  Neck: No thyromegaly present.  Cardiovascular: Normal rate, regular rhythm and intact distal pulses.  Murmur heard. 1/6  Pulmonary/Chest: Effort normal and breath sounds normal. No respiratory distress.  Abdominal: Soft. Bowel sounds are normal. She exhibits no distension. There is no tenderness.  Genitourinary:  Genitourinary Comments: Foley   Musculoskeletal: She exhibits edema.  Is able to move all extremities Leans to left in wheelchair 1+ bilateral lower extremity edema   Lymphadenopathy:    She has no cervical adenopathy.  Neurological: She is alert.  Skin: Skin is warm and dry. She is not diaphoretic.  Psychiatric: She has a normal mood and affect.     Assessment/Plan  TODAY   1. Hypertension associated with diabetes type 2 : is stable b/p 118/72 will continue lisinopril 2.5 mg daily   2. Dyslipidemia associated with diabetes type 2: stable will continue lipitor 10 mg daily  3. Diabetes mellitus with neurological manifestations: without change  hgb a1c is 7.8 (previous 7.1)  Urine for micro-albumin is 64.5;   Will continue metformin xr  500 mg  daily;   tradjenta 5 mg daily and will monitor  is on statin and ace   4 .Chronic  pain with osteoarthritis of both knees has polyneuropathy due to type 2 diabetes mellitus: she is  getting adequate pain relief: will continue voltaren gel 2 gm to both knees four times daily; will continue neurontin  300 mg three times daily; and will continue ultram  50 mg every 6 hours routinely  5. Right lung nodule: 6 mm will repeat ct scan of chest in June 2019  6. Chronic pancreatitis:  Sequelae of chronic pancreatitis with multiple small pancreatic cysts most compatible with pseudocyst. Will not make changes  7. Neuroleptic-induced tardive  dyskinesia: is stable will continue cogentin 0.5 mg nightly   8. Psychosis; due to alcohol: she is presently without change in status; will continue zyprexa 10 mg daily; thiamine and folic acid daily. Is on ativan 0.5 mg twice daily for anxiety  She has recently been started on depakote 125 mg twice daily to help stabilize mood  9. 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 Her current weight is 120 pounds her weight in July 2018: 154 pounds with this disease process weight loss is an expected outcome.  10. Upper leg DVT chronic left:  (08-08-16; #2) stable  will continue xarelto 20 mg nightly she is on long term therapy  will monitor this at this time.      11. Bilateral lower extremity edema: no change in status:  will continue demadex  20 mg daily with k+ 20 meq twice daily   12. Chronic constipation: stable  will continue senna s twice daily; miralax twice daily and  linzess 72 mcg daily   13. Urine retention: has chronic indwelling foley catheter: stable  will have has been seen by urology; will need foley long term     Her health maintenance is up to date  Time spent with patient and staff 45 minutes: reviewing status; care plan; goals of care and medical record.    MD is aware of resident's narcotic use and is in agreement with current plan of care. We will wean dosage as appropriate for resident   Ok Edwards NP [facility]  Contact [phone] Monday through Friday 8am- 5pm  After hours call [phone]

## 2017-09-26 ENCOUNTER — Other Ambulatory Visit: Payer: Self-pay

## 2017-09-26 MED ORDER — TRAMADOL HCL 50 MG PO TABS: 50.0000 mg | ORAL_TABLET | Freq: Four times a day (QID) | ORAL | 4 refills | Status: AC

## 2017-09-26 MED ORDER — LORAZEPAM 0.5 MG PO TABS: 0.5000 mg | ORAL_TABLET | Freq: Two times a day (BID) | ORAL | 4 refills | Status: DC

## 2017-09-26 NOTE — Telephone Encounter (Signed)
Rx faxed to [facility] (p) [phone], (f) [phone]  

## 2017-09-28 ENCOUNTER — Non-Acute Institutional Stay (SKILLED_NURSING_FACILITY): Payer: Medicaid Other | Admitting: Adult Health

## 2017-09-28 ENCOUNTER — Encounter: Payer: Self-pay | Admitting: Adult Health

## 2017-09-28 DIAGNOSIS — F10950 Alcohol use, unspecified with alcohol-induced psychotic disorder with delusions: Secondary | ICD-10-CM

## 2017-09-28 DIAGNOSIS — I825Y2 Chronic embolism and thrombosis of unspecified deep veins of left proximal lower extremity: Secondary | ICD-10-CM | POA: Diagnosis not present

## 2017-09-28 DIAGNOSIS — I5042 Chronic combined systolic (congestive) and diastolic (congestive) heart failure: Secondary | ICD-10-CM | POA: Diagnosis not present

## 2017-09-28 DIAGNOSIS — F0150 Vascular dementia without behavioral disturbance: Secondary | ICD-10-CM | POA: Diagnosis not present

## 2017-09-28 MED ORDER — LORAZEPAM 0.5 MG PO TABS: 0.5000 mg | ORAL_TABLET | Freq: Two times a day (BID) | ORAL | 0 refills | Status: AC

## 2017-09-28 NOTE — Progress Notes (Signed)
Location:   [facility] Room Number: 564 PPIRJ of Service:  SNF (31)    CODE STATUS: full code   No Known Allergies  Chief Complaint  Patient presents with  . Discharge Note    discharge to SNF on 10-03-17     HPI:  She is a long term resident of this facility being discharged to another SNF in order to be closer to family. She will not be needing any dme or home health services as the facility will provide. She will follow up medically with the medical provider at the receiving facility. She will need her ativan prescription written. She does propel herself in her wheeelchair throughout the floor. She is unable to fully participate in the hpi or ros. There are no reports of agitation; changes in appetite; or pain.    Past Medical History:  Diagnosis Date  . Acute deep vein thrombosis (DVT) of left femoral vein ([place]) 02/22/2015  . Alcohol abuse   . Arthritis   . Chronic constipation 08/26/2015  . Dementia with behavioral problem   . Diabetes mellitus without complication ([place])   . Dysphagia    and aspiration risk  . 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 Seizures ([place])     History reviewed. No pertinent surgical history.  Social History   Socioeconomic History  . Marital status: Unknown    Spouse name: Not on file  . Number of children: Not on file  . Years of education: Not on file  . Highest education level: Not on file  Occupational History  . Not on file  Social Needs  . Financial resource strain: Not on file  . Food insecurity:    Worry: Not on file    Inability: Not on file  . Transportation needs:    Medical: Not on file    Non-medical: Not on file  Tobacco Use  . Smoking status: Former Smoker    Packs/day: 0.50    Years: 15.00    Pack years: 7.50    Types: Cigarettes  . Smokeless tobacco: Never Used  Substance and Sexual Activity  . Alcohol use: Yes    Alcohol/week: 1.2 oz    Types: 2 Cans of beer per week    Comment: occ  . Drug  use: No  . Sexual activity: Not Currently  Lifestyle  . Physical activity:    Days per week: Not on file    Minutes per session: Not on file  . Stress: Not on file  Relationships  . Social connections:    Talks on phone: Not on file    Gets together: Not on file    Attends religious service: Not on file    Active member of club or organization: Not on file    Attends meetings of clubs or organizations: Not on file    Relationship status: Not on file  . Intimate partner violence:    Fear of current or ex partner: Not on file    Emotionally abused: Not on file    Physically abused: Not on file    Forced sexual activity: Not on file  Other Topics Concern  . Not on file  Social History Narrative  . Not on file   History reviewed. No pertinent family history.  VITAL SIGNS BP (!) 142/76   Pulse 80   Temp 98 F (36.7 C)   Resp 18   Ht 5' 3"  (1.6 m)   Wt 120 lb 11.2  oz (54.7 kg)   LMP 03/25/2015 (Approximate)   SpO2 98%   BMI 21.38 kg/m   Patient's Medications  New Prescriptions   No medications on file  Previous Medications   ATORVASTATIN (LIPITOR) 10 MG TABLET    Take 10 mg by mouth daily.   BENZTROPINE (COGENTIN) 0.5 MG TABLET    Take 0.5 mg by mouth at bedtime.   CHOLECALCIFEROL (VITAMIN D) 1000 UNITS TABLET    Take 1,000 Units by mouth daily. Reported on 10/28/2015   CRANBERRY 400 MG TABS    Take 1 tablet by mouth 2 (two) times daily.   DICLOFENAC SODIUM (VOLTAREN) 1 % GEL    Apply 2 g topically 4 (four) times daily. Apply to both knees topically four times a day for pain.    DIVALPROEX (DEPAKOTE SPRINKLE) 125 MG CAPSULE    Take 125 mg by mouth 3 (three) times daily.    ENSURE (ENSURE)    Give 120cc by mouth three times a day in between meals for weight support   GABAPENTIN (NEURONTIN) 300 MG CAPSULE    Take 300 mg by mouth 3 (three) times daily.   LINACLOTIDE (LINZESS) 72 MCG CAPSULE    Take 72 mcg by mouth daily before breakfast.   LINAGLIPTIN (TRADJENTA) 5 MG TABS  TABLET    Take 5 mg by mouth daily.   LISINOPRIL (PRINIVIL,ZESTRIL) 2.5 MG TABLET    Take 2.5 mg by mouth daily. Hold for SBP < 110   LORAZEPAM (ATIVAN) 0.5 MG TABLET    Take 1 tablet (0.5 mg total) by mouth 2 (two) times daily.   MELATONIN 3 MG TABS    Take 3 mg by mouth at bedtime.    METFORMIN (GLUCOPHAGE) 500 MG TABLET    Take 500 mg by mouth daily.   MIRTAZAPINE (REMERON) 7.5 MG TABLET    Take 7.5 mg by mouth at bedtime. X 30 days   MULTIPLE VITAMINS-MINERALS (DECUBI-VITE) CAPS    Take 1 capsule by mouth at bedtime.    NUTRITIONAL SUPPLEMENTS (NUTRITIONAL SUPPLEMENT PO)    Regular Diet - Mechanical Soft texture,  Thin liquids consistency.  Patient may have mech soft sandwiches and mech soft snacks   OLANZAPINE (ZYPREXA) 10 MG TABLET    Take 10 mg by mouth at bedtime.   POLYETHYLENE GLYCOL (MIRALAX / GLYCOLAX) PACKET    Take 17 g by mouth 2 (two) times daily.   POTASSIUM CHLORIDE SA (K-DUR,KLOR-CON) 20 MEQ TABLET    Take 20 mEq by mouth 2 (two) times daily.    RIVAROXABAN (XARELTO) 20 MG TABS TABLET    Take 1 tablet (20 mg total) by mouth daily with supper.   SENNA-DOCUSATE (SENOKOT-S) 8.6-50 MG TABLET    Take 1 tablet by mouth 2 (two) times daily.   THIAMINE 100 MG TABLET    Take 100 mg by mouth daily.   TORSEMIDE (DEMADEX) 20 MG TABLET    Take 20 mg by mouth daily.    TRAMADOL (ULTRAM) 50 MG TABLET    Take 1 tablet (50 mg total) by mouth every 6 (six) hours.  Modified Medications   No medications on file  Discontinued Medications   No medications on file     SIGNIFICANT DIAGNOSTIC EXAMS  PREVIOUS  10-02-14: left leg doppler: +dvt  08-08-16: left lower extremity doppler: + DVT  04-26-17: ct of abdomen and pelvis: 1. Large amount of retained large bowel stool, stool distended rectum associated with fecal impaction. 2. Sequelae of chronic pancreatitis with multiple  small pancreatic cysts most compatible with pseudocyst. 3. Cholelithiasis and extra hepatic biliary dilatation without  choledocholithiasis or CT findings of acute cholecystitis. 4. Distended urinary bladder seen with neurogenic bladder/bladder outlet obstruction. Recommend correlation with voiding. 5. 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 Aortic Atherosclerosis  04-26-17: ct of head: No CT evidence for acute intracranial abnormality.   04-28-17: 2-d echo:   - Left ventricle: The cavity size was mildly dilated. Wall thickness was normal. Systolic function was severely reduced. The estimated ejection fraction was in the range of 20% to 25%. Severe diffuse hypokinesis with regional variations. Possible disproportionately severe hypokinesis of the inferolateral, inferior, and inferoseptal myocardium. Features are consistent with a pseudonormal left ventricular filling pattern, with concomitant abnormal relaxation and increased filling pressure (grade 2 diastolic dysfunction).  04-29-17: ct of chest: 1. There is a 6 mm nodule in the right lung base. Non-contrast chest CT at 6-12 months is recommended. If the nodule is stable at time of repeat CT, then future CT at 18-24 months (from today's scan) is considered optional for low-risk patients, but is recommended for high-risk patients. :  2. No CT correlate for the recent chest x-ray finding in the right mid lung. 3. No other abnormalities.  08-12-17: chest x-ray:  Minimal right base atelectasis. No edema or consolidation. No adenopathy. Cardiac silhouette within normal limits.  08-13-17: renal ultrasound: Unremarkable sonographic appearance of the kidneys.  NO NEW EXAMS.       LABS REVIEWED: PREVIOUS  01-04-17: wbc 10.7; hgb 12.3; hct 37.3; mcv 83.6; plt 227; glucose 120; bun 13.6; creat 0.58; k+ 3.7; na++ 140; ca 9.4  01-11-17: BNP 339.10 02-16-17: tsh 1.39; vit B 12: 7517; folic 00.1  74-94-49: hgb a1c 7.8  04-20-17: wbc 12.4; hgb 11.9; hct 35.9; mcv 85.3; plt 268; glucose 136; bun 16.5; creat 0.60 ;k+ 3.3; na++ 138; liver  normal albumin 4.1  04-26-17: wbc 18.5; hgb 11.9; hct 35.9; mcv 86.;3 plt 297; glucose 149; bun 24;creat 0.99; k+ 3.3; na++ 139; ca 9.6; liver normal albumin 3.7; ammonia 18; blood culture: no growth 04-27-17: urine culture: e-coli 04-28-17: wbc 12.9; hgb 8.7; hct 26.5; mcv 86.9; plt 206; glucose 110; bun 7; creat 0.61; k+ 3.5; na++ 144; ca 8.6; liver normal albumin 2.7 04-30-17: glucose 110; bun <5; creat 0.63; k+ 3.0; na++ 144; ca 8.6; mag 1.8; vit B 12: 1282; folate 33.0; iron 43; tibc 235. 05-02-17: wbc 12.4; hgb 11.9; hct 35.1; mcv 86.1 plt 453; glucose 120; bun 7.5; creat 0.57; k+ 4.2; na++ 141; ca 9.7; ast 63; alt 62; albumin 4.3;  05-12-17: FOBT: neg 05-17-17: glucose 140; bun 13.5; creat 0.56; k+ 3.8; na++ 137; ca 9.9; liver normal albumin 4.1  06-05-17: FOBT positive; c-diff: neg 06-08-17: wbc 11.0; hgb 10.7; hct 31.8; mcv 83.0; plt 381 07-11-17: wbc 11.6; hgb 11.1; hct 32.5 ;mcv 79.6; plt 397; glucose 90; bun 10.7; creat 0.62; k+ 3.7; na++ 141; ca 9.4; alk phos 134; albumin 4.2; urine culture: 65,000 multiple bacteria    Lymph# 4.2  07-16-17: urine micro-albumin 64.5; micro/creat ratio: 367.8  08-02-17: glucose 114; bun 32.0; creat 1.31; k+ 4.1; na++ 139; ca 9.6 08-12-17: wbc 18,9; hgb 12.0; hct 37.0; mcv 82.8; plt 414; glucose 126; bun 80; creat 4.42; k+ 4.8; na++ 140; ca 9.5; liver normal albumin 3.5; blood culture: no growth: urine culture: klebsiella pneumoniae and e-coli 08-14-17: wbc 13.8; hgb 10.0; hct 31.0; mcv 82.9; plt 335; glucose 106; bun 24; creat 1.09; k+ 3.5;  na++ 151; ca 9.1; mag 2.0; phos 1.9 08-17-17: wbc 9.0; hgb 9.5; hct 30.6; mcv 82.3; plt 281; glucose 112; bun <5; creat 0.79; k+ 3.8; na++ 146; ca 8.7; mag 2.2 08-19-17: wbc 12.5; hgb 10.5; hct 33.0; mcv 82.5; plt 305; glucose 115; bun <5; creat 0.76; k+ 2.9; na++ 144; ca 8.7; mag 1.5  08-27-17: glucose 105; bun 17.3; creat 1.60; k+ 4.0; na++ 135; ca 9.2; liver normal albumin 3.6 09-04-17: depakote 41     NO NEW LABS.   Review of  Systems  Unable to perform ROS: Dementia (confusion )    Physical Exam  Constitutional: No distress.  Frail   Neck: No thyromegaly present.  Cardiovascular: Normal rate, regular rhythm and intact distal pulses.  Murmur heard. 1/6  Pulmonary/Chest: Effort normal and breath sounds normal. No respiratory distress.  Abdominal: Soft. Bowel sounds are normal. She exhibits no distension. There is no tenderness.  Genitourinary:  Genitourinary Comments: Foley   Musculoskeletal: She exhibits edema.  Is able to move all extremities Leans to left in wheelchair 1+ bilateral lower extremity edema   Lymphadenopathy:    She has no cervical adenopathy.  Neurological: She is alert.  Skin: Skin is warm. She is not diaphoretic.  Psychiatric: She has a normal mood and affect.    ASSESSMENT/ PLAN:  Patient is being discharged with the following home health services:  None needed  Patient is being discharged with the following durable medical equipment:  None needed; receiving facility to provide  Patient has been advised to f/u with their PCP in 1-2 weeks to bring them up to date on their rehab stay.  Social services at facility was responsible for arranging this appointment.  Pt was provided with a 30 day supply of prescriptions for medications and refills must be obtained from their PCP.  For controlled substances, a more limited supply may be provided adequate until PCP appointment only.   Facility will provide her with her medications; per the list as above  #60 ativan 0.5 mg tabs written.    Time spent with patient 35 minutes: reviewed discharge orders; medication; expectations of discharge. Staff verbalized understanding.   Ok Edwards NP [facility]  Contact [phone] Monday through Friday 8am- 5pm  After hours call [phone]

## 2017-11-25 ENCOUNTER — Encounter: Payer: Self-pay | Admitting: Internal Medicine

## 2018-01-16 ENCOUNTER — Encounter: Payer: Self-pay | Admitting: Internal Medicine

## 2019-06-10 IMAGING — CT CT ABD-PELV W/ CM
2 of 5 series · 15 of 46 positions shown, 17 images · IV contrast (ISOVUE)
Comparison: CT abdomen and pelvis August 25, 2015

CLINICAL DATA: Severe abdominal distension. Altered mental status.
History of constipation, alcohol abuse, diabetes.

EXAM:
CT ABDOMEN AND PELVIS WITH CONTRAST
TECHNIQUE: Multidetector CT imaging of the abdomen and pelvis was performed
using the standard protocol following bolus administration of
intravenous contrast.
CONTRAST:  100mL [id number]-H00 IOPAMIDOL (R3YNHP-H00) INJECTION 61%

[Series 2: abd/pel with · axial · 0.85mm/px · z∈[+1156,+1561]mm · 12 of 93 slices shown, 14 images]
[im 6/93  soft-tissue]
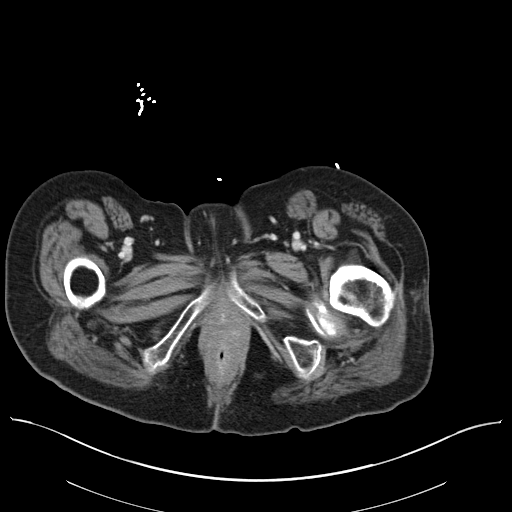
[im 6/93  bone]
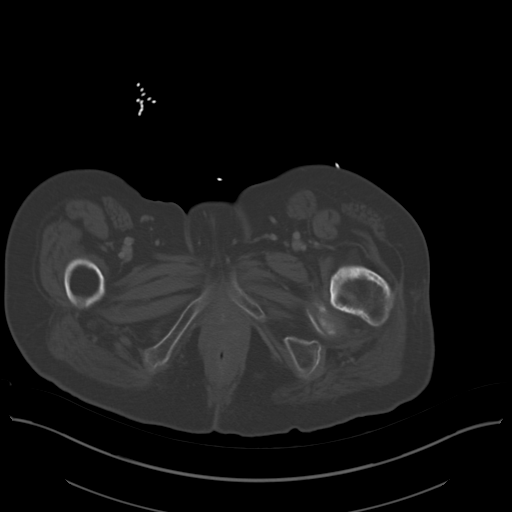
[im 12/93  soft-tissue]
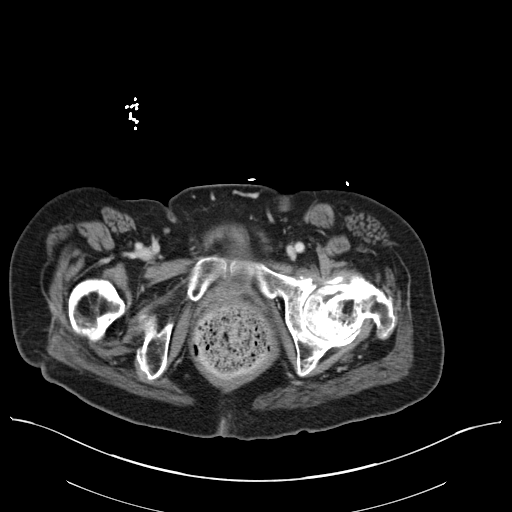
[im 24/93  soft-tissue]
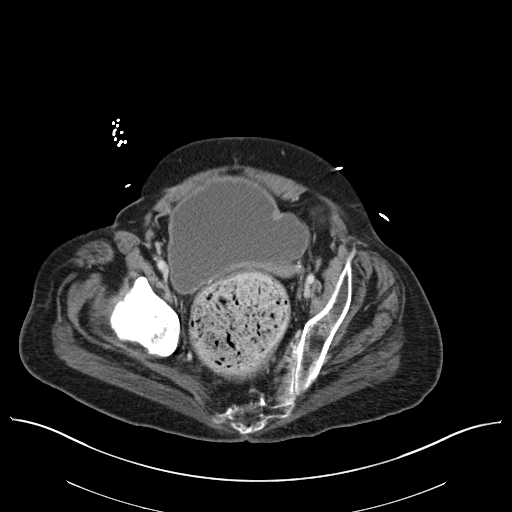
[im 29/93  soft-tissue]
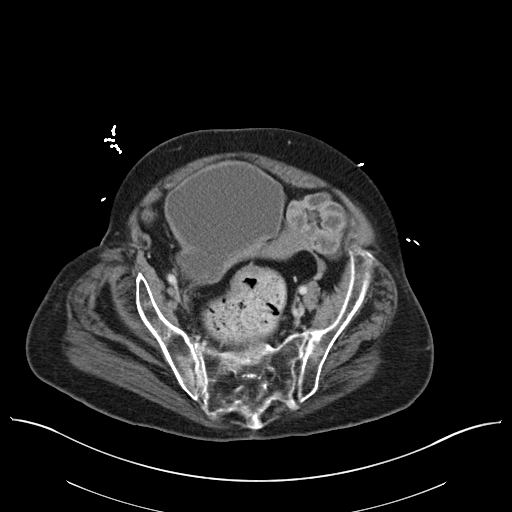
[im 35/93  soft-tissue]
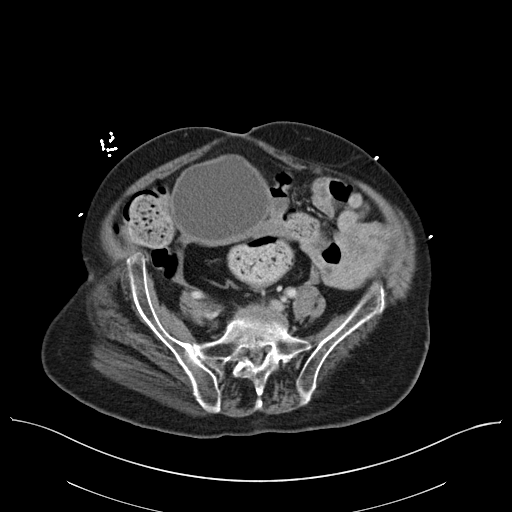
[im 41/93  soft-tissue]
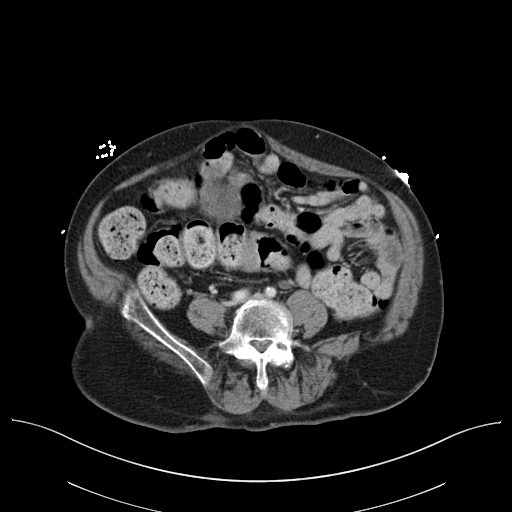
[im 52/93  soft-tissue]
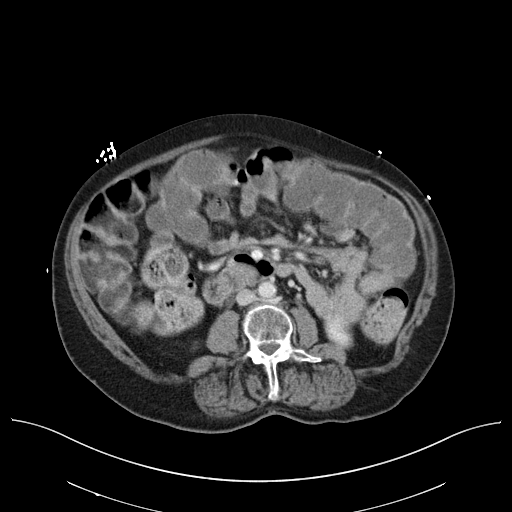
[im 58/93  soft-tissue]
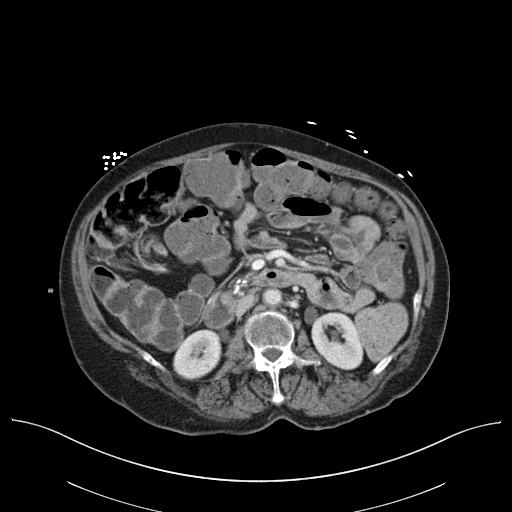
[im 64/93  soft-tissue]
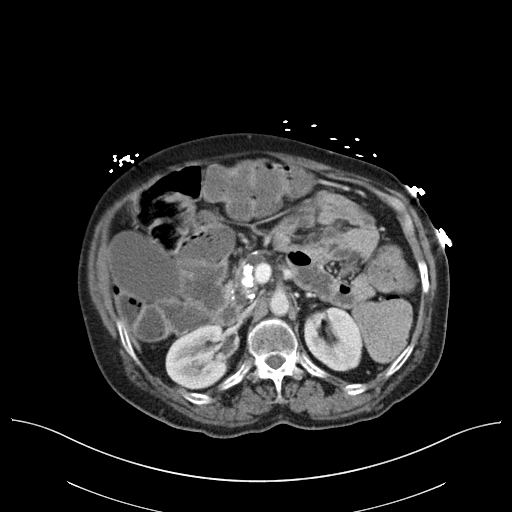
[im 64/93  bone]
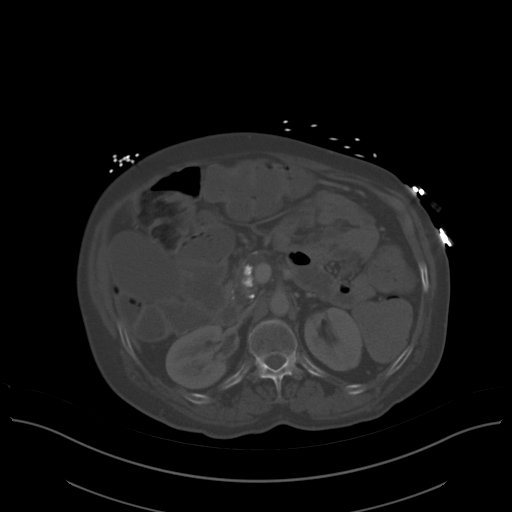
[im 70/93  soft-tissue]
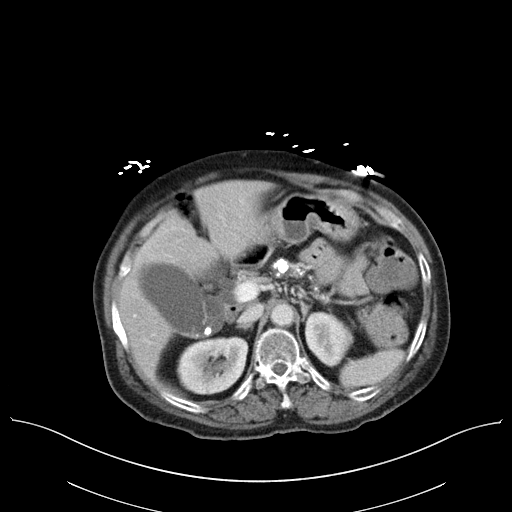
[im 81/93  soft-tissue]
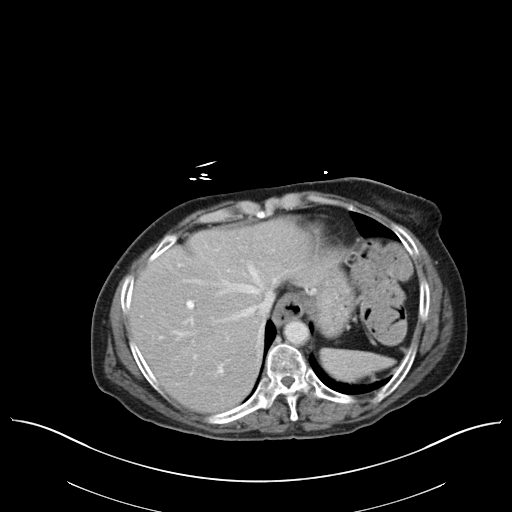
[im 87/93  soft-tissue]
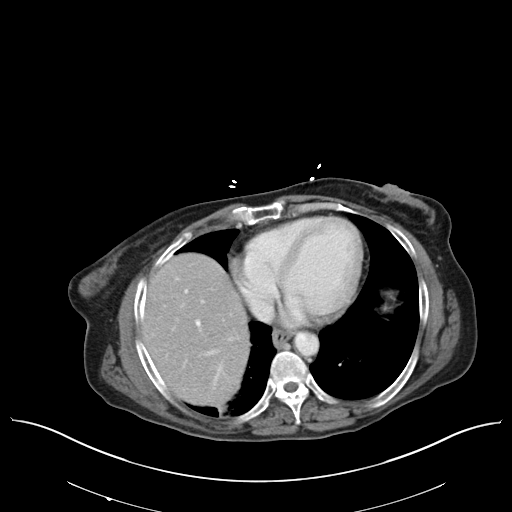

[Series 4: coronal a/|p · coronal · 0.74mm/px · 3 of 164 slices shown]
[im 55/164  soft-tissue]
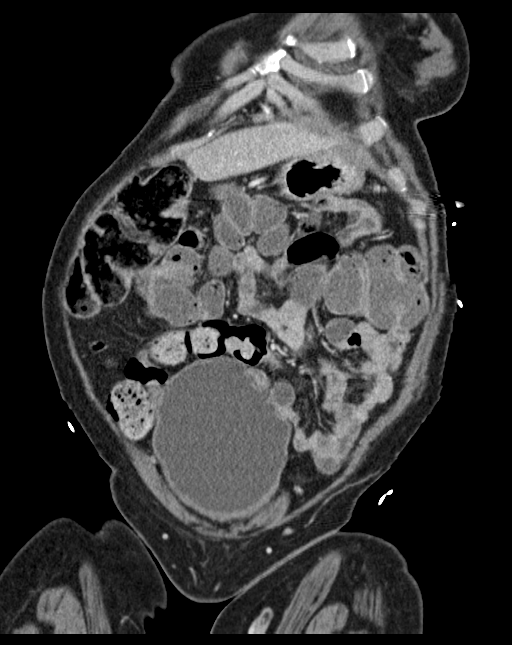
[im 73/164  soft-tissue]
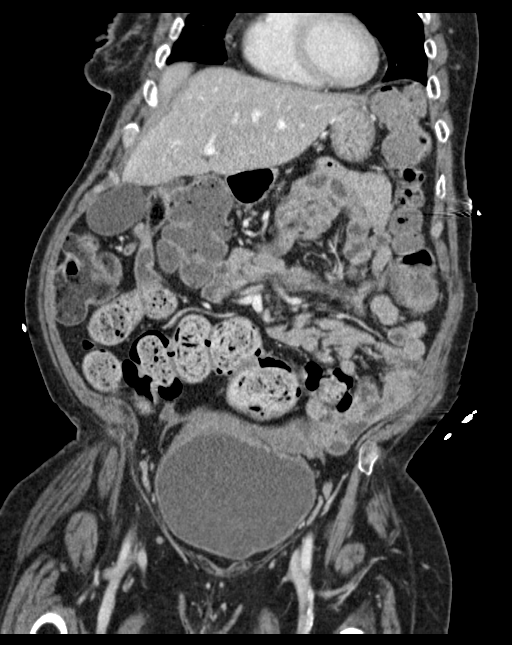
[im 91/164  soft-tissue]
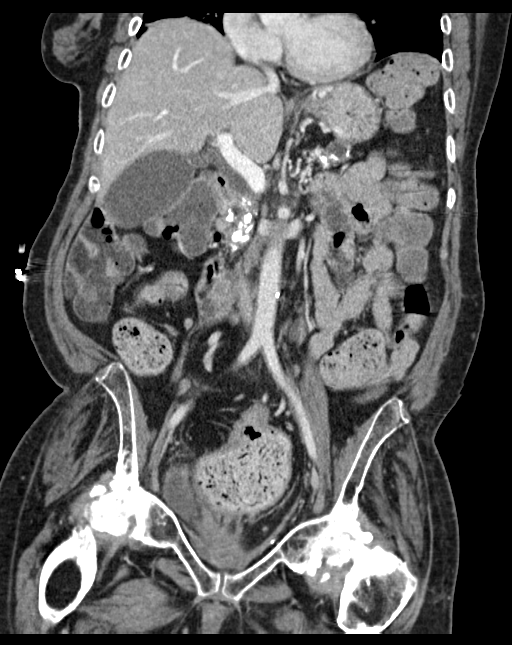

[15 of 46 positions shown; findings below may reference images not displayed]

FINDINGS: LOWER CHEST: RIGHT lung base atelectasis. Included heart size is
normal. No pericardial effusion.

HEPATOBILIARY: Distended gallbladder with multiple subcentimeter
gallstones. Dilated Common bile duct at 13 mm, no
choledocholithiasis. No CT findings of acute cholecystitis. Normal
liver.

PANCREAS: Atrophic pancreas with multiple coarse calcifications.
Unchanged 16 mm peripherally calcified cystic mass in distal body,
additional smaller cysts most compatible with pseudocyst. Chronic
pancreatic duct dilatation.

SPLEEN: Normal.

ADRENALS/URINARY TRACT: Kidneys are orthotopic, demonstrating
symmetric enhancement. No nephrolithiasis, hydronephrosis or solid
renal masses. The unopacified ureters are normal in course and
caliber. Delayed imaging through the kidneys demonstrates symmetric
prompt contrast excretion within the proximal urinary collecting
system. Urinary bladder is distended with lobulated urinary bladder
with multiple small diverticula seen with neurogenic bladder,
similar to prior CT. Normal adrenal glands.

STOMACH/BOWEL: Small hiatal hernia. Stool distended rectum at 7.9 cm
with large amount of retained large bowel stool. Small amount of
small bowel feces compatible with chronic stasis. Normal appendix.

VASCULAR/LYMPHATIC: Aortoiliac vessels are normal in course and
caliber. Mild intimal thickening calcific atherosclerosis. No
lymphadenopathy by CT size criteria.

REPRODUCTIVE: Status post hysterectomy.

OTHER: No intraperitoneal free fluid or free air.

MUSCULOSKELETAL: Nonacute. Mild rectus abdominis diastases. Severe
bilateral hip osteoarthrosis with secondary AVN and femoral head
collapse. Osteopenia.
IMPRESSION: 1. Large amount of retained large bowel stool, stool distended
rectum associated with fecal impaction.
2. Sequelae of chronic pancreatitis with multiple small pancreatic
cysts most compatible with pseudocyst.
3. Cholelithiasis and extra hepatic biliary dilatation without
choledocholithiasis or CT findings of acute cholecystitis.
4. Distended urinary bladder seen with neurogenic bladder/bladder
outlet obstruction. Recommend correlation with voiding.
5. Severe bilateral hip osteoarthrosis and secondary AVN with
femoral head collapse.
Aortic Atherosclerosis ([id number]-XB0.0).

## 2020-09-26 DEATH — deceased
